# Patient Record
Sex: Female | Born: 1950
Health system: Southern US, Community
[De-identification: ages and names within clinical notes are randomized; demographics above are authoritative.]

## PROBLEM LIST (undated history)

## (undated) DIAGNOSIS — T7840XA Allergy, unspecified, initial encounter: Secondary | ICD-10-CM

## (undated) DIAGNOSIS — K219 Gastro-esophageal reflux disease without esophagitis: Secondary | ICD-10-CM

## (undated) DIAGNOSIS — D649 Anemia, unspecified: Secondary | ICD-10-CM

## (undated) DIAGNOSIS — S149XXA Injury of unspecified nerves of neck, initial encounter: Secondary | ICD-10-CM

## (undated) DIAGNOSIS — Z780 Asymptomatic menopausal state: Secondary | ICD-10-CM

## (undated) DIAGNOSIS — B2790 Infectious mononucleosis, unspecified without complication: Secondary | ICD-10-CM

## (undated) DIAGNOSIS — G4730 Sleep apnea, unspecified: Secondary | ICD-10-CM

## (undated) DIAGNOSIS — K635 Polyp of colon: Secondary | ICD-10-CM

## (undated) DIAGNOSIS — G589 Mononeuropathy, unspecified: Secondary | ICD-10-CM

## (undated) DIAGNOSIS — Z9289 Personal history of other medical treatment: Secondary | ICD-10-CM

## (undated) DIAGNOSIS — C801 Malignant (primary) neoplasm, unspecified: Secondary | ICD-10-CM

## (undated) DIAGNOSIS — M810 Age-related osteoporosis without current pathological fracture: Secondary | ICD-10-CM

## (undated) DIAGNOSIS — R413 Other amnesia: Secondary | ICD-10-CM

## (undated) DIAGNOSIS — R011 Cardiac murmur, unspecified: Secondary | ICD-10-CM

## (undated) DIAGNOSIS — F329 Major depressive disorder, single episode, unspecified: Secondary | ICD-10-CM

## (undated) DIAGNOSIS — F32A Depression, unspecified: Secondary | ICD-10-CM

## (undated) DIAGNOSIS — F419 Anxiety disorder, unspecified: Secondary | ICD-10-CM

## (undated) DIAGNOSIS — M1990 Unspecified osteoarthritis, unspecified site: Secondary | ICD-10-CM

## (undated) DIAGNOSIS — H269 Unspecified cataract: Secondary | ICD-10-CM

## (undated) DIAGNOSIS — IMO0002 Reserved for concepts with insufficient information to code with codable children: Secondary | ICD-10-CM

## (undated) DIAGNOSIS — M8580 Other specified disorders of bone density and structure, unspecified site: Secondary | ICD-10-CM

## (undated) HISTORY — DX: Age-related osteoporosis without current pathological fracture: M81.0

## (undated) HISTORY — DX: Unspecified osteoarthritis, unspecified site: M19.90

## (undated) HISTORY — DX: Major depressive disorder, single episode, unspecified: F32.9

## (undated) HISTORY — PX: CARPAL TUNNEL RELEASE: SHX101

## (undated) HISTORY — DX: Reserved for concepts with insufficient information to code with codable children: IMO0002

## (undated) HISTORY — PX: UMBILICAL HERNIA REPAIR: SHX196

## (undated) HISTORY — DX: Polyp of colon: K63.5

## (undated) HISTORY — PX: CERVICAL FUSION: SHX112

## (undated) HISTORY — PX: BACK SURGERY: SHX140

## (undated) HISTORY — DX: Unspecified cataract: H26.9

## (undated) HISTORY — PX: ROTATOR CUFF REPAIR: SHX139

## (undated) HISTORY — DX: Malignant (primary) neoplasm, unspecified: C80.1

## (undated) HISTORY — PX: ABDOMINAL HYSTERECTOMY: SHX81

## (undated) HISTORY — DX: Depression, unspecified: F32.A

## (undated) HISTORY — DX: Other specified disorders of bone density and structure, unspecified site: M85.80

## (undated) HISTORY — PX: JOINT REPLACEMENT: SHX530

## (undated) HISTORY — PX: SPINE SURGERY: SHX786

## (undated) HISTORY — PX: FRACTURE SURGERY: SHX138

## (undated) HISTORY — PX: KNEE SURGERY: SHX244

## (undated) HISTORY — PX: LUMBAR DISC SURGERY: SHX700

## (undated) HISTORY — PX: HERNIA REPAIR: SHX51

## (undated) HISTORY — DX: Other amnesia: R41.3

## (undated) HISTORY — DX: Mononeuropathy, unspecified: G58.9

## (undated) HISTORY — DX: Allergy, unspecified, initial encounter: T78.40XA

## (undated) HISTORY — DX: Gastro-esophageal reflux disease without esophagitis: K21.9

## (undated) HISTORY — DX: Injury of unspecified nerves of neck, initial encounter: S14.9XXA

## (undated) HISTORY — DX: Anxiety disorder, unspecified: F41.9

## (undated) HISTORY — PX: EYE SURGERY: SHX253

## (undated) HISTORY — DX: Asymptomatic menopausal state: Z78.0

## (undated) HISTORY — DX: Anemia, unspecified: D64.9

## (undated) HISTORY — DX: Sleep apnea, unspecified: G47.30

---
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
## 1969-09-30 DIAGNOSIS — B2790 Infectious mononucleosis, unspecified without complication: Secondary | ICD-10-CM

## 1969-09-30 HISTORY — DX: Infectious mononucleosis, unspecified without complication: B27.90

## 1998-04-17 ENCOUNTER — Other Ambulatory Visit: Admission: RE | Admit: 1998-04-17 | Discharge: 1998-04-17 | Payer: Self-pay | Admitting: Gynecology

## 1998-05-18 ENCOUNTER — Ambulatory Visit (HOSPITAL_COMMUNITY): Admission: RE | Admit: 1998-05-18 | Discharge: 1998-05-18 | Payer: Self-pay | Admitting: Gynecology

## 1998-06-09 ENCOUNTER — Inpatient Hospital Stay (HOSPITAL_COMMUNITY): Admission: RE | Admit: 1998-06-09 | Discharge: 1998-06-11 | Payer: Self-pay | Admitting: Neurosurgery

## 1999-06-19 ENCOUNTER — Other Ambulatory Visit: Admission: RE | Admit: 1999-06-19 | Discharge: 1999-06-19 | Payer: Self-pay | Admitting: Gynecology

## 2000-10-07 ENCOUNTER — Other Ambulatory Visit: Admission: RE | Admit: 2000-10-07 | Discharge: 2000-10-07 | Payer: Self-pay | Admitting: Gynecology

## 2001-09-18 ENCOUNTER — Ambulatory Visit (HOSPITAL_BASED_OUTPATIENT_CLINIC_OR_DEPARTMENT_OTHER): Admission: RE | Admit: 2001-09-18 | Discharge: 2001-09-18 | Payer: Self-pay | Admitting: Orthopedic Surgery

## 2001-12-01 ENCOUNTER — Other Ambulatory Visit: Admission: RE | Admit: 2001-12-01 | Discharge: 2001-12-01 | Payer: Self-pay | Admitting: Gynecology

## 2002-12-28 ENCOUNTER — Other Ambulatory Visit: Admission: RE | Admit: 2002-12-28 | Discharge: 2002-12-28 | Payer: Self-pay | Admitting: Gynecology

## 2004-01-25 ENCOUNTER — Encounter: Admission: RE | Admit: 2004-01-25 | Discharge: 2004-01-25 | Payer: Self-pay | Admitting: Internal Medicine

## 2004-02-08 ENCOUNTER — Encounter: Admission: RE | Admit: 2004-02-08 | Discharge: 2004-02-08 | Payer: Self-pay | Admitting: Internal Medicine

## 2004-02-18 ENCOUNTER — Other Ambulatory Visit: Admission: RE | Admit: 2004-02-18 | Discharge: 2004-02-18 | Payer: Self-pay | Admitting: Gynecology

## 2005-01-13 IMAGING — NM NM HEPATO W/GB/PHARM/[PERSON_NAME]
7 series · 7 of 7 positions shown · non-contrast
Comparison: none

CLINICAL DATA: Right upper quadrant and epigastric abdominal pain and nausea. 
 NM HEPATOBILIARY SCAN W/EJECTION FRACTION: 
 Following the intravenous administration of 5.4 mCi of Qc11m Choletec, normal visualization of the liver, bile ducts, gallbladder, and small bowel was demonstrated. 
 Following the oral ingestion of 8 oz of half and half cream, a gallbladder ejection fraction of 63% was obtained.  The normal gallbladder ejection fraction with half and half cream is greater than 50%.

[gb hepatobiliary · 1 of 1 slices shown (1 of 7)]
[im 1/1]
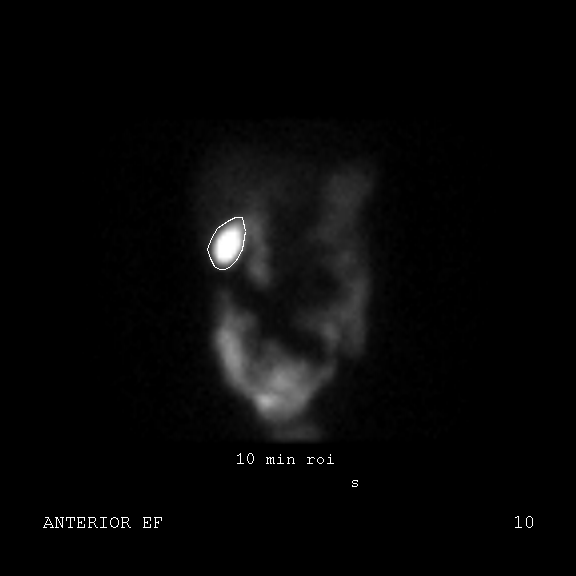

[gb hepatobiliary · 1 of 1 slices shown (2 of 7)]
[im 1/1]
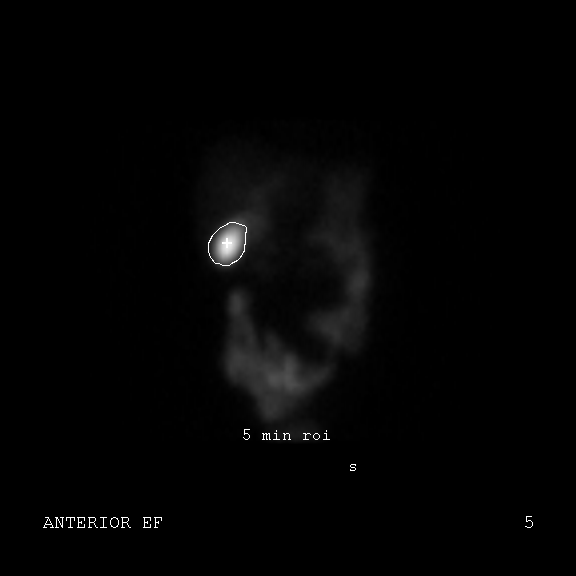

[gb hepatobiliary · 1 of 1 slices shown (3 of 7)]
[im 1/1]
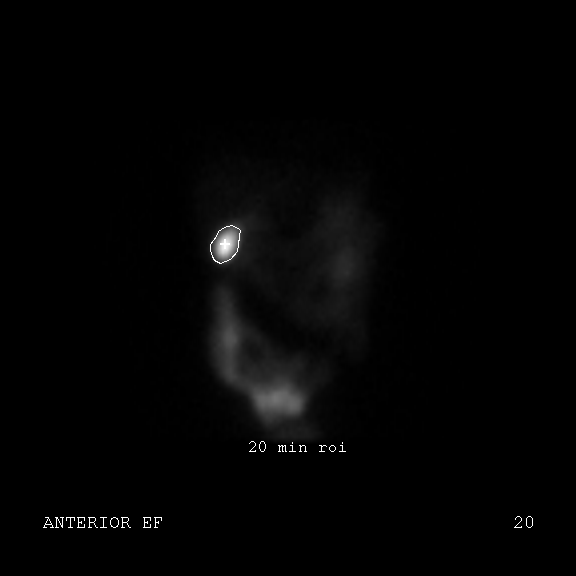

[gb hepatobiliary · 1 of 1 slices shown (4 of 7)]
[im 1/1]
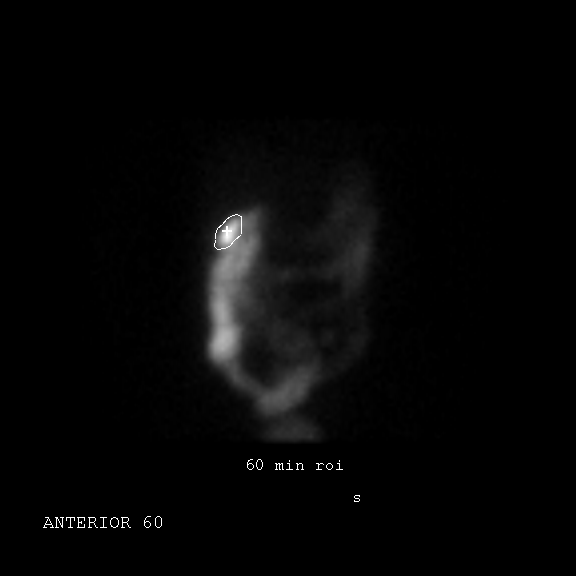

[gb hepatobiliary · 1 of 1 slices shown (5 of 7)]
[im 1/1]
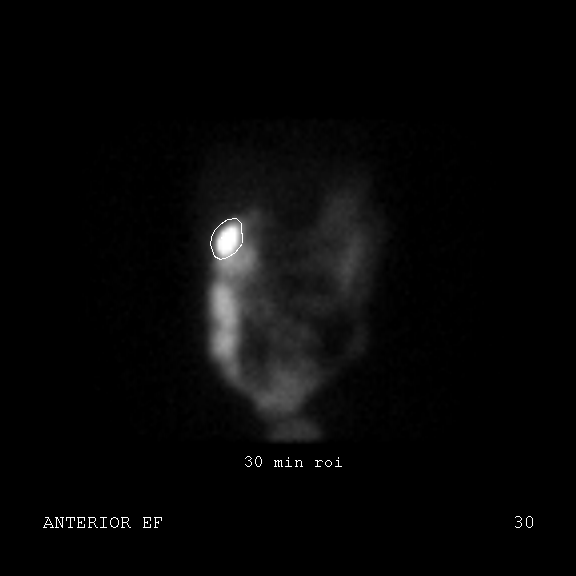

[gb hepatobiliary · 1 of 1 slices shown (6 of 7)]
[im 1/1]
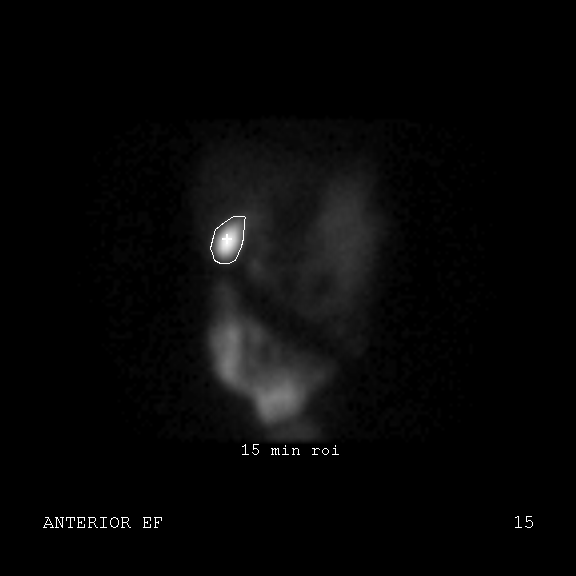

[gb hepatobiliary · 1 of 1 slices shown (7 of 7)]
[im 1/1]
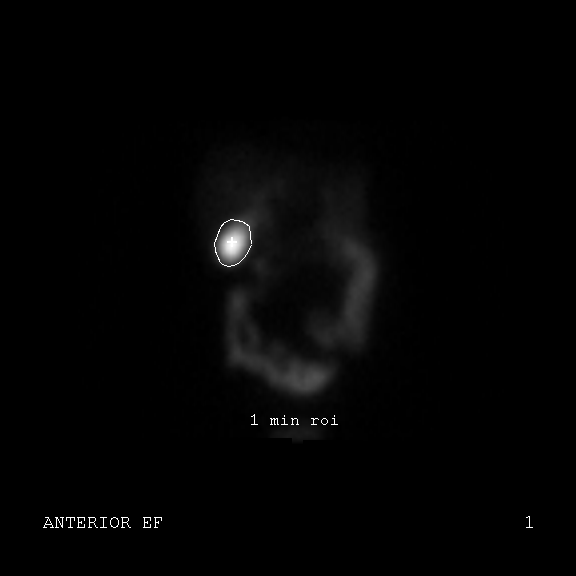

[7 of 7 positions shown; findings below may reference images not displayed]

IMPRESSION: Normal examination.

## 2005-03-23 ENCOUNTER — Other Ambulatory Visit: Admission: RE | Admit: 2005-03-23 | Discharge: 2005-03-23 | Payer: Self-pay | Admitting: Gynecology

## 2005-06-28 ENCOUNTER — Ambulatory Visit: Payer: Self-pay | Admitting: Internal Medicine

## 2005-08-05 ENCOUNTER — Ambulatory Visit: Payer: Self-pay | Admitting: Gastroenterology

## 2005-08-19 ENCOUNTER — Ambulatory Visit: Payer: Self-pay | Admitting: Gastroenterology

## 2005-08-19 ENCOUNTER — Encounter (INDEPENDENT_AMBULATORY_CARE_PROVIDER_SITE_OTHER): Payer: Self-pay | Admitting: *Deleted

## 2005-09-27 ENCOUNTER — Ambulatory Visit (HOSPITAL_COMMUNITY): Admission: RE | Admit: 2005-09-27 | Discharge: 2005-09-27 | Payer: Self-pay | Admitting: Sports Medicine

## 2005-11-22 ENCOUNTER — Inpatient Hospital Stay (HOSPITAL_COMMUNITY): Admission: RE | Admit: 2005-11-22 | Discharge: 2005-11-25 | Payer: Self-pay | Admitting: Neurosurgery

## 2006-01-29 ENCOUNTER — Encounter: Payer: Self-pay | Admitting: Neurosurgery

## 2006-11-06 ENCOUNTER — Ambulatory Visit: Payer: Self-pay | Admitting: Internal Medicine

## 2007-02-10 ENCOUNTER — Encounter: Payer: Self-pay | Admitting: Internal Medicine

## 2007-04-06 ENCOUNTER — Ambulatory Visit: Payer: Self-pay | Admitting: Internal Medicine

## 2007-06-09 ENCOUNTER — Ambulatory Visit: Payer: Self-pay | Admitting: Internal Medicine

## 2008-09-30 HISTORY — PX: LUMBAR DISC SURGERY: SHX700

## 2008-10-27 ENCOUNTER — Ambulatory Visit: Payer: Self-pay | Admitting: Internal Medicine

## 2008-10-27 DIAGNOSIS — IMO0002 Reserved for concepts with insufficient information to code with codable children: Secondary | ICD-10-CM

## 2008-10-27 DIAGNOSIS — M5412 Radiculopathy, cervical region: Secondary | ICD-10-CM | POA: Insufficient documentation

## 2008-11-03 ENCOUNTER — Telehealth: Payer: Self-pay | Admitting: Internal Medicine

## 2008-11-07 ENCOUNTER — Telehealth (INDEPENDENT_AMBULATORY_CARE_PROVIDER_SITE_OTHER): Payer: Self-pay | Admitting: *Deleted

## 2008-11-11 ENCOUNTER — Encounter: Admission: RE | Admit: 2008-11-11 | Discharge: 2008-11-11 | Payer: Self-pay | Admitting: Internal Medicine

## 2008-11-14 ENCOUNTER — Encounter (INDEPENDENT_AMBULATORY_CARE_PROVIDER_SITE_OTHER): Payer: Self-pay | Admitting: *Deleted

## 2008-11-16 ENCOUNTER — Encounter: Payer: Self-pay | Admitting: Internal Medicine

## 2008-12-22 ENCOUNTER — Ambulatory Visit (HOSPITAL_COMMUNITY): Admission: RE | Admit: 2008-12-22 | Discharge: 2008-12-23 | Payer: Self-pay | Admitting: Neurosurgery

## 2009-01-16 ENCOUNTER — Encounter: Payer: Self-pay | Admitting: Internal Medicine

## 2009-05-25 ENCOUNTER — Emergency Department (HOSPITAL_COMMUNITY): Admission: EM | Admit: 2009-05-25 | Discharge: 2009-05-25 | Payer: Self-pay | Admitting: Family Medicine

## 2009-06-09 ENCOUNTER — Ambulatory Visit: Payer: Self-pay | Admitting: Family Medicine

## 2009-06-09 DIAGNOSIS — R109 Unspecified abdominal pain: Secondary | ICD-10-CM

## 2009-06-13 ENCOUNTER — Encounter (INDEPENDENT_AMBULATORY_CARE_PROVIDER_SITE_OTHER): Payer: Self-pay | Admitting: *Deleted

## 2009-06-13 LAB — CONVERTED CEMR LAB
Albumin: 4.2 g/dL (ref 3.5–5.2)
Bilirubin, Direct: 0.1 mg/dL (ref 0.0–0.3)
Total Bilirubin: 0.5 mg/dL (ref 0.3–1.2)

## 2009-08-02 ENCOUNTER — Ambulatory Visit: Payer: Self-pay | Admitting: Internal Medicine

## 2009-08-02 DIAGNOSIS — F325 Major depressive disorder, single episode, in full remission: Secondary | ICD-10-CM | POA: Insufficient documentation

## 2009-08-02 DIAGNOSIS — G479 Sleep disorder, unspecified: Secondary | ICD-10-CM | POA: Insufficient documentation

## 2009-08-02 DIAGNOSIS — R5383 Other fatigue: Secondary | ICD-10-CM

## 2009-08-02 DIAGNOSIS — Z860100 Personal history of colon polyps, unspecified: Secondary | ICD-10-CM | POA: Insufficient documentation

## 2009-08-02 DIAGNOSIS — M8580 Other specified disorders of bone density and structure, unspecified site: Secondary | ICD-10-CM

## 2009-08-02 DIAGNOSIS — G5600 Carpal tunnel syndrome, unspecified upper limb: Secondary | ICD-10-CM

## 2009-08-04 ENCOUNTER — Encounter (INDEPENDENT_AMBULATORY_CARE_PROVIDER_SITE_OTHER): Payer: Self-pay | Admitting: *Deleted

## 2009-08-04 LAB — CONVERTED CEMR LAB
Basophils Absolute: 0 10*3/uL (ref 0.0–0.1)
Eosinophils Absolute: 0.2 10*3/uL (ref 0.0–0.7)
Free T4: 0.7 ng/dL (ref 0.6–1.6)
HCT: 35.8 % — ABNORMAL LOW (ref 36.0–46.0)
Hemoglobin: 12.1 g/dL (ref 12.0–15.0)
Lymphs Abs: 1.8 10*3/uL (ref 0.7–4.0)
MCHC: 33.9 g/dL (ref 30.0–36.0)
MCV: 100.5 fL — ABNORMAL HIGH (ref 78.0–100.0)
Monocytes Relative: 9.4 % (ref 3.0–12.0)
Neutro Abs: 2.6 10*3/uL (ref 1.4–7.7)
Platelets: 214 10*3/uL (ref 150.0–400.0)
TSH: 1.67 microintl units/mL (ref 0.35–5.50)

## 2009-09-21 ENCOUNTER — Ambulatory Visit: Payer: Self-pay | Admitting: Internal Medicine

## 2009-09-21 DIAGNOSIS — F411 Generalized anxiety disorder: Secondary | ICD-10-CM | POA: Insufficient documentation

## 2009-10-06 ENCOUNTER — Telehealth (INDEPENDENT_AMBULATORY_CARE_PROVIDER_SITE_OTHER): Payer: Self-pay | Admitting: *Deleted

## 2009-10-30 ENCOUNTER — Encounter: Payer: Self-pay | Admitting: Internal Medicine

## 2010-02-12 ENCOUNTER — Ambulatory Visit: Payer: Self-pay | Admitting: Internal Medicine

## 2010-09-10 ENCOUNTER — Encounter (INDEPENDENT_AMBULATORY_CARE_PROVIDER_SITE_OTHER): Payer: Self-pay | Admitting: *Deleted

## 2010-10-30 NOTE — Miscellaneous (Signed)
Summary: Flu/[facility]  Flu/[facility]   Imported By: Lanelle Bal 11/03/2009 14:31:10  _____________________________________________________________________  External Attachment:    Type:   Image     Comment:   External Document

## 2010-10-30 NOTE — Assessment & Plan Note (Signed)
Summary: chest congestion//temp 101.6/lch   Vital Signs:  Patient profile:   60 year old female Weight:      124.6 pounds Temp:     101.3 degrees F BP sitting:   104 / 60  Vitals Entered By: Shary Decamp (Feb 12, 2010 2:17 PM) CC: acute only, sick x several day, clear nasal/chest congestion until yesterday-- now thick, yellow; fever (up to 102.1) x 3 days.  Using mucinex   History of Present Illness: symptoms started 5 days ago with a scratchy, tight throat Later on she developed cough initially with clear sputum , then some  chest congestion. Now she is coughing more, sputum is yellow to green in color She had fever up to 102 2 days ago She is taking Robitussin over-the-counter.   Current Medications (verified): 1)  Evista 60 Mg  Tabs (Raloxifene Hcl) .Marland Kitchen.. 1 By Mouth Qd 2)  Cymbalta 60 Mg Cpep (Duloxetine Hcl) .Marland Kitchen.. 1 Once Daily 3)  Abilify 5 Mg Tabs (Aripiprazole) .Marland Kitchen.. 1 Once Daily  Allergies (verified): 1)  ! Neurontin  Past History:  Past Medical History: Reviewed history from 08/02/2009 and no changes required. DDD Depression ? Osteopenia CTS surgery RUE Colonic polyps, hx of 2005  Past Surgical History: Reviewed history from 08/02/2009 and no changes required. umbilical hernia; G2 P2 Cervical fusion X2; Lumbar surgery(rods ,screws), Dr Venetia Maxon; Rotator cuff repair R  Social History: Reviewed history from 08/02/2009 and no changes required. Occupation:Teacher part time Married Never Smoked Alcohol use-yes:rare Regular exercise-no  Review of Systems       some sinus congestion No sick contacts that she can tell No nausea vomiting or diarrhea No history of asthma or tobacco use  Physical Exam  General:  alert and well-developed.  frequent cough noted, no respiratory distress Head:  face symmetric, not tender to palpation at the maxillary or frontal sinuses Ears:  R ear normal and L ear normal.   Nose:  slightly congested Mouth:  no redness or  discharge Lungs:  normal respiratory effort and no intercostal retractions.  few rhonchi with cough, no wheezing   Impression & Recommendations:  Problem # 1:  BRONCHITIS- ACUTE (ICD-466.0)  symptoms consistent with bronchitis sse instructions  Her updated medication list for this problem includes:    Zithromax Z-pak 250 Mg Tabs (Azithromycin) .Marland Kitchen... As directed    Hydromet 5-1.5 Mg/76ml Syrp (Hydrocodone-homatropine) .Marland KitchenMarland KitchenMarland KitchenMarland Kitchen 5 cc by mouth every 8 hours as needed for severe cough  Complete Medication List: 1)  Evista 60 Mg Tabs (Raloxifene hcl) .Marland Kitchen.. 1 by mouth qd 2)  Cymbalta 60 Mg Cpep (Duloxetine hcl) .Marland Kitchen.. 1 once daily 3)  Abilify 5 Mg Tabs (Aripiprazole) .Marland Kitchen.. 1 once daily 4)  Zithromax Z-pak 250 Mg Tabs (Azithromycin) .... As directed 5)  Hydromet 5-1.5 Mg/51ml Syrp (Hydrocodone-homatropine) .... 5 cc by mouth every 8 hours as needed for severe cough  Patient Instructions: 1)  rest, fluids, Tylenol 2)  Robitussin-DM as needed for cough 3)  If the cough is severe, you can use hydrocodone. Watch for excessive somnolence 4)  Take antibiotics as prescribed 5)  Call if not better in a few days, call anytime if you feel a lot worse 6)    Prescriptions: HYDROMET 5-1.5 MG/5ML SYRP (HYDROCODONE-HOMATROPINE) 5 cc by mouth every 8 hours as needed for severe cough  #150cc x 0   Entered and Authorized by:   Nolon Rod. Lyncoln Ledgerwood MD   Signed by:   Nolon Rod. Neithan Day MD on 02/12/2010   Method used:  Print then Give to Patient   RxID:   [id number] ZITHROMAX Z-PAK 250 MG TABS (AZITHROMYCIN) as directed  #1 x 0   Entered and Authorized by:   Nolon Rod. Cayle Cordoba MD   Signed by:   Nolon Rod. Duwayne Matters MD on 02/12/2010   Method used:   Electronically to        [facility] [address] [phone]* (retail)       [address]       [place], [place]  [address]       Ph: [phone] or [phone]       Fax: [phone]   RxID:   [id number]

## 2010-10-30 NOTE — Progress Notes (Signed)
Summary: rx med  Phone Note Call from Patient   Summary of Call: pt left VM that she was given sample of ABILIFY and it is helping some and she wants to know what she needs to do if she need a rx for it.called pt back informed pt that rx was given at [facility] pt states that she forgot about them but she has them and will go fill them...............Marland KitchenFelecia Deloach CMA  October 06, 2009 2:05 PM

## 2010-11-01 ENCOUNTER — Ambulatory Visit: Admit: 2010-11-01 | Payer: Self-pay | Admitting: Internal Medicine

## 2010-11-01 ENCOUNTER — Encounter: Payer: Self-pay | Admitting: Internal Medicine

## 2010-11-01 ENCOUNTER — Other Ambulatory Visit: Payer: Self-pay | Admitting: Internal Medicine

## 2010-11-01 ENCOUNTER — Ambulatory Visit (INDEPENDENT_AMBULATORY_CARE_PROVIDER_SITE_OTHER): Payer: BC Managed Care – PPO | Admitting: Internal Medicine

## 2010-11-01 DIAGNOSIS — Z86010 Personal history of colonic polyps: Secondary | ICD-10-CM

## 2010-11-01 DIAGNOSIS — Z0000 Encounter for general adult medical examination without abnormal findings: Secondary | ICD-10-CM

## 2010-11-01 DIAGNOSIS — F329 Major depressive disorder, single episode, unspecified: Secondary | ICD-10-CM

## 2010-11-01 DIAGNOSIS — M899 Disorder of bone, unspecified: Secondary | ICD-10-CM

## 2010-11-01 LAB — CBC WITH DIFFERENTIAL/PLATELET
Basophils Relative: 0.6 % (ref 0.0–3.0)
Eosinophils Absolute: 0.1 10*3/uL (ref 0.0–0.7)
Eosinophils Relative: 2.2 % (ref 0.0–5.0)
Hemoglobin: 12.4 g/dL (ref 12.0–15.0)
Lymphocytes Relative: 41 % (ref 12.0–46.0)
Monocytes Relative: 8.1 % (ref 3.0–12.0)
Neutro Abs: 1.9 10*3/uL (ref 1.4–7.7)
Neutrophils Relative %: 48.1 % (ref 43.0–77.0)
Platelets: 209 10*3/uL (ref 150.0–400.0)

## 2010-11-01 LAB — BASIC METABOLIC PANEL
BUN: 18 mg/dL (ref 6–23)
CO2: 30 mEq/L (ref 19–32)
Calcium: 9.2 mg/dL (ref 8.4–10.5)
Chloride: 104 mEq/L (ref 96–112)
Creatinine, Ser: 0.8 mg/dL (ref 0.4–1.2)
Potassium: 4.1 mEq/L (ref 3.5–5.1)

## 2010-11-01 LAB — LIPID PANEL
HDL: 65.4 mg/dL (ref >39.00)
Triglycerides: 36 mg/dL (ref 0.0–149.0)
VLDL: 7.2 mg/dL (ref 0.0–40.0)

## 2010-11-01 LAB — HEPATIC FUNCTION PANEL
ALT: 12 U/L (ref 0–35)
AST: 20 U/L (ref 0–37)
Albumin: 3.7 g/dL (ref 3.5–5.2)
Alkaline Phosphatase: 54 U/L (ref 39–117)
Bilirubin, Direct: 0.1 mg/dL (ref 0.0–0.3)
Total Protein: 6.6 g/dL (ref 6.0–8.3)

## 2010-11-01 LAB — TSH: TSH: 1.57 u[IU]/mL (ref 0.35–5.50)

## 2010-11-01 NOTE — Letter (Signed)
Summary: Primary Care Appointment Letter  [facility]  [address] [place], [place] [address]   Phone: [phone]  Fax: [phone]    09/10/2010 MRN: [id number]  [facility] [address] [place], [place]  [address]  Dear Ms. Arterberry,   Your Primary Care Physician Marga Melnick MD has indicated that:    ____X___it is time to schedule an appointment(In order to continue refilling meds please call to schedule a yearly check-up).    _______you missed your appointment on______ and need to call and          reschedule.    _______you need to have lab work done.    _______you need to schedule an appointment discuss lab or test results.    _______you need to call to reschedule your appointment that is                       scheduled on _________.     Please call our office as soon as possible. Our phone number is [phone]. Please press option 1. Our office is open 8a-5p, Monday through Friday.     Thank you,    [facility]

## 2010-11-05 LAB — CONVERTED CEMR LAB: Vit D, 25-Hydroxy: 28 ng/mL — ABNORMAL LOW (ref 30–89)

## 2010-11-07 NOTE — Assessment & Plan Note (Signed)
Summary: med refill/sph     confirmed with patient     2/1   ///sph   Vital Signs:  Patient profile:   60 year old female Height:      59.5 inches Weight:      124 pounds BMI:     24.71 Temp:     98.2 degrees F oral Pulse rate:   76 / minute Resp:     14 per minute BP sitting:   112 / 70  (left arm) Cuff size:   regular  Vitals Entered By: Shonna Chock CMA (November 01, 2010 10:52 AM) CC: CPX, not fasting today (patient had few sip's of Diet Dr.Pepper), Depressive symptoms   CC:  CPX, not fasting today (patient had few sip's of Diet Dr.Pepper), and Depressive symptoms.  History of Present Illness:     Jessica Casey  is here for a physical; she feels depression is poorly controlled despite Cymbalta 60 mg once daily . Abilify " made me  a zombie... no pleasure. I couln't cry when my mother died January 25, 2010." The patient reports depressed mood, loss of interest/pleasure, and hypersomnia, but denies significant weight loss, significant weight gain, and insomnia 9if Cymbalta taken).  The patient also reports fatigue or loss of energy, feelings of worthlessness, and indecisiveness.  The patient denies diminished concentration and suicidal intent.  The patient reports the following psychosocial stressors: recent death of a loved one & stress of uncertainly of Gary's job.    Current Medications (verified): 1)  Evista 60 Mg  Tabs (Raloxifene Hcl) .Marland Kitchen.. 1 By Mouth Qd 2)  Cymbalta 60 Mg Cpep (Duloxetine Hcl) .Marland Kitchen.. 1 Once Daily**appointment Due** 3)  Mega Red .Marland Kitchen.. 1 By Mouth Once Daily 4)  Calcium 600 1500 Mg Tabs (Calcium Carbonate) .Marland Kitchen.. 1 By Mouth Once Daily 5)  Biotin 300 Mcg Tabs (Biotin) .Marland Kitchen.. 1 By Mouth Once Daily 6)  Aspirin 81 Mg Tabs (Aspirin) .Marland Kitchen.. 1 By Mouth Once Daily  Allergies: 1)  ! Neurontin  Past History:  Past Medical History: DDD Depression ? 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    Physical Exam  General:  well-nourished;alert,appropriate and cooperative throughout examination Head:  Normocephalic and atraumatic without obvious abnormalities.  Eyes:  No corneal or conjunctival inflammation noted.Perrla. Funduscopic exam benign, without hemorrhages, exudates or papilledema.  Ears:  External ear exam shows no significant lesions or deformities.  Otoscopic examination reveals clear canals, tympanic membranes are intact bilaterally without bulging, retraction, inflammation or discharge. Hearing is grossly normal bilaterally. Nose:  External nasal examination shows no deformity or inflammation. Nasal mucosa are pink and moist without lesions or exudates. Mouth:  Oral mucosa and oropharynx without lesions or exudates.  Teeth in good repair. Neck:  No deformities, masses, or tenderness noted. Lungs:  Normal respiratory effort,  chest expands symmetrically. Lungs are clear to auscultation, no crackles or wheezes. Heart:  normal rate, regular rhythm, no gallop, no rub, no JVD, no HJR, and grade 1/2  /6 systolic murmur.   Abdomen:  Bowel sounds positive,abdomen soft and non-tender without  masses, organomegaly or hernias noted. Aorta palpable w/o AAA Genitalia:  Dr. Greta Doom Msk:  No deformity or scoliosis noted of thoracic or lumbar spine.   LS op scar wel healed Pulses:  R and L carotid,radial,dorsalis pedis and posterior tibial pulses are full and equal bilaterally Extremities:  No clubbing, cyanosis, edema, or deformity noted with normal full range of motion of all joints.   Neurologic:  alert & oriented X3, strength normal in all extremities, and DTRs symmetrical and normal.   Skin:  Intact without suspicious lesions or rashes Cervical Nodes:  No lymphadenopathy noted Axillary Nodes:  No palpable lymphadenopathy Psych:  memory intact for recent and remote and subdued.     Impression & Recommendations:  Problem # 1:  ROUTINE GENERAL MEDICAL EXAM@HEALTH  CARE FACL (ICD-V70.0)  Orders: EKG w/ Interpretation ([address]) Venipuncture ([address]) TLB-Lipid Panel (80061-LIPID) TLB-BMP (Basic Metabolic Panel-BMET) (80048-METABOL) TLB-CBC Platelet - w/Differential (85025-CBCD) TLB-Hepatic/Liver Function Pnl (80076-HEPATIC) TLB-TSH (Thyroid Stimulating Hormone) (84443-TSH) T-Vitamin D (25-Hydroxy) (60454-09811) Gastroenterology Referral (GI)  Problem # 2:  DEPRESSION (ICD-311)  Her updated medication list for this problem includes:    Cymbalta 60 Mg Cpep (Duloxetine hcl) .Marland Kitchen... 1 once daily  Problem # 3:  OSTEOPENIA (ICD-733.90)  Her updated medication list for this problem includes:    Evista 60 Mg Tabs (Raloxifene hcl) .Marland Kitchen... 1 by mouth qd  Problem # 4:  COLONIC POLYPS, HX OF (ICD-V12.72)  Orders: Gastroenterology Referral (GI)  Complete Medication List: 1)  Evista 60 Mg Tabs (Raloxifene hcl) .Marland Kitchen.. 1 by mouth qd 2)  Cymbalta 60 Mg Cpep (Duloxetine hcl) .Marland Kitchen.. 1 once daily 3)  Mega Red  .Marland Kitchen.. 1 by mouth once daily 4)  Calcium 600 1500 Mg Tabs (Calcium carbonate) .Marland Kitchen.. 1 by mouth once daily 5)  Biotin 300 Mcg Tabs (Biotin) .Marland Kitchen.. 1 by mouth once daily 6)  Aspirin 81 Mg Tabs  (Aspirin) .Marland Kitchen.. 1 by mouth once daily  Patient Instructions: 1)  Check with Dr Greta Doom; BMD  is due every 25 months. Prescriptions: CYMBALTA 60 MG CPEP (DULOXETINE HCL) 1 once daily  #30 x 11   Entered and Authorized by:   Marga Melnick MD   Signed by:   Marga Melnick MD on 11/01/2010   Method used:   Print then Give to Patient   RxID:   [id number]    Orders Added: 1)  Est. Patient 40-64 years [99396] 2)  EKG w/ Interpretation [93000] 3)  Venipuncture [36415] 4)  TLB-Lipid Panel [80061-LIPID] 5)  TLB-BMP (Basic Metabolic Panel-BMET) [80048-METABOL] 6)  TLB-CBC Platelet - w/Differential [85025-CBCD] 7)  TLB-Hepatic/Liver Function Pnl [80076-HEPATIC] 8)  TLB-TSH (Thyroid Stimulating Hormone) [84443-TSH] 9)  T-Vitamin D (25-Hydroxy) [78469-62952] 10)  Gastroenterology Referral [GI]     Appended Document: med refill/sph     confirmed with patient     2/1   ///sph

## 2011-01-10 LAB — CBC
RBC: 3.8 MIL/uL — ABNORMAL LOW (ref 3.87–5.11)
RDW: 13.6 % (ref 11.5–15.5)
WBC: 4.6 10*3/uL (ref 4.0–10.5)

## 2011-02-12 NOTE — Op Note (Signed)
[name], Casey NO.:  [id number]   MEDICAL RECORD NO.:  [id number]          PATIENT TYPE:  OIB   LOCATION:  3599                         FACILITY:  [facility]   PHYSICIAN:  Danae Orleans. Venetia Maxon, M.D.  DATE OF BIRTH:  02-01-1951   DATE OF PROCEDURE:  12/22/2008  DATE OF DISCHARGE:                               OPERATIVE REPORT   PREOPERATIVE DIAGNOSES:  Neck pain with herniated cervical disk,  spondylosis, and cervical radiculopathy.   POSTOPERATIVE DIAGNOSES:  Neck pain with herniated cervical disk,  spondylosis, and cervical radiculopathy.   PROCEDURES:  1. Exploration and fusion, C5-C7 levels.  2. Anterior cervical decompression fusion, C4-C5 with bone allograft      and autograft and anterior cervical plate.   SURGEON:  Danae Orleans. Venetia Maxon, MD   ASSISTANT:  Hilda Lias, MD   ANESTHESIA:  General endotracheal anesthesia.   ESTIMATED BLOOD LOSS:  Minimal.   COMPLICATIONS:  None.   DISPOSITION:  Recovery.   INDICATIONS:  Jessica Casey is a 60 year old woman who previously had  undergone anterior cervical decompression and fusion at C5-C7 level and  has developed neck pain and upper extremity radiculopathy with weakness  in her left greater than right deltoid and spondylolisthesis at C4-C5  with herniated cervical disk at this level.  It was elected to take her  to surgery for anterior cervical decompression and fusion at C4-C5 level  and exploration of prior fusion at C5-C7 levels.   PROCEDURE IN DETAIL:  Jessica Casey was brought to the operating room.  Following a satisfactory, uncomplicated, and excellent general  endotracheal anesthesia plus intravenous lines, the patient was placed  in a supine position on the operative table.  The neck was placed in  slight extension.  She was placed in 5 pounds halter traction.  Her  anterior neck was then prepped and draped in the usual sterile fashion.  The area of planned incision was infiltrated with 0.25% Marcaine  and  0.5% lidocaine with 1:200,000 epinephrine.  Incision was made through  previous left-sided transverse skin incision carried through the  platysma layer.  Subplatysmal dissection was performed exposing the  anterior border of the sternocleidomastoid muscle using blunt  dissection, carotid sheath was kept lateral, trachea and esophagus kept  medial exposing the previously placed anterior cervical plate.  Extensive scar tissue was removed from the plate and the plate was  removed.  The screw holes were packed with Gelfoam.  The bony interfaces  were inspected over the previous area of fusion and appeared to be solid  arthrodesis at these levels with no evidence of instability.  Subsequently, the large ventral osteophyte at C4-C5 was removed and  longus colli muscles were taken down from C4-C5 bilaterally.  Self-  retaining Shadow-Line retractor was placed along with up and down  retractor.  The interspace was incised.  Disk material was removed in  piecemeal fashion.  Using gentle distraction, the interspace was opened  with distraction pins and the endplates were decorticated using high-  speed drill.  The uncinate spurs were removed and posterior longitudinal  ligament was incised and  removed in piecemeal fashion decompressing the  cervical spinal cord dura and both the neural foramina.  After trial  sizing, a 6-mm allograft small bone wedge was selected and fashioned  with high-speed drill, packed with morselized bone autograft which had  been retained from drilling the endplates, and after hemostasis was  assured at this level, the bone graft was inserted and countersunk  appropriately.  A 14-mm Trestle anterior cervical plate was affixed to  the anterior cervical spine using variable angle 14-mm screws with a  rescue screw at the upper holes on the C5 level and 14 x 4 mm screws at  C4.  All screws had excellent purchase and locking mechanisms were  engaged.  Traction weight was  removed prior to placing the plate.  The  wound was then irrigated.  Soft tissues were inspected and found to be  in good repair and the final x-ray demonstrated well-positioned  interbody graft and anterior cervical plate.  The wound was irrigated.  Soft tissue were then closed with 3-0 Vicryl at the platysma and 3-0  Vicryl subcuticular stitch.  The wound was dressed with Dermabond.  The  patient was extubated in the operating room and taken to the recovery  room in stable and satisfactory condition having tolerated the operation  well.  Counts were correct at the end of the case.      Danae Orleans. Venetia Maxon, M.D.  Electronically Signed     JDS/MEDQ  D:  12/22/2008  T:  12/22/2008  Job:  [id number]

## 2011-02-15 NOTE — Op Note (Signed)
[place]  Patient:    Jessica Casey, Jessica Casey Visit Number: [id number] MRN: [id number]          Service Type: DSU Location: [facility] Attending Physician:  Ronne Binning Dictated by:   Nicki Reaper, M.D. Proc. Date: 09/18/01 Admit Date:  09/18/2001                             Operative Report  PREOPERATIVE DIAGNOSIS:  Carpal tunnel syndrome, right hand.  POSTOPERATIVE DIAGNOSIS:  Carpal tunnel syndrome, right hand.  OPERATION:  Decompression right median nerve.  SURGEON:  Nicki Reaper, M.D.  ASSISTANT:  Joaquin Courts, R.N.  ANESTHESIA:  Forearm-based IV regional.  DATE OF OPERATION:  September 18, 2001  ANESTHESIOLOGIST:  Janetta Hora. Gelene Mink, M.D.  HISTORY:  The patient is a 60 year old female with a history of carpal tunnel syndrome, EMG nerve conduction is positive, which has not responded to conservative treatment.  PROCEDURE:  The patient was brought to the operating room where a forearm-based IV regional anesthetic was carried out without difficulty.  She was prepped and draped using Betadine scrub and solution with the right arm free.  A longitudinal incision was made in the palm and carried down through the subcutaneous tissue.  Bleeders were electrocauterized.  The palmar fascia was split, the superficial palmar arch identified, the flexor tendon tendon to the ring and little finger identified to the ulnar side of the median nerve. The carpal retinaculum was incised with sharp dissection.  A right angle and Sewall retractor were placed between the skin and forearm fascia.  The fascia was released for approximately 3 cm proximal to the wrist crease under direct vision.  The canal was explored and no further lesions were identified.  The wound was irrigated.  The skin was closed with interrupted 5-0 nylon sutures. A sterile compressive dressing and splint were applied.  The patient tolerated the procedure well and was taken to the  recovery room for observation in satisfactory condition.  DISPOSITION:  She is discharged home to return to [facility] in one week on Vicodin and Keflex. Dictated by:   Nicki Reaper, M.D. Attending Physician:  Ronne Binning DD:  09/18/01 TD:  09/19/01 Job: [id number]

## 2011-02-15 NOTE — Discharge Summary (Signed)
[name], [name] NO.:  [id number]   MEDICAL RECORD NO.:  [id number]          PATIENT TYPE:  INP   LOCATION:  3002                         FACILITY:  [facility]   PHYSICIAN:  Stefani Dama, M.D.  DATE OF BIRTH:  May 08, 1951   DATE OF ADMISSION:  11/22/2005  DATE OF DISCHARGE:  11/25/2005                                 DISCHARGE SUMMARY   ADMISSION DIAGNOSIS:  L3-L4 spondylolisthesis with stenosis, lumbar  radiculopathy and herniated nucleus pulposus.   DISCHARGE DIAGNOSIS:  L3-L4 spondylolisthesis with stenosis, lumbar  radiculopathy and herniated nucleus pulposus.   FINAL DIAGNOSIS:  L3-L4 spondylolisthesis with stenosis, lumbar  radiculopathy and herniated nucleus pulposus.   MAJOR OPERATION:  L3-L4 laminectomy, posterior lumbar interbody arthrodesis  with peak spacers pedicle screw fixation posterolateral arthrodesis with  local autograft and allograft L3-L4 on November 22, 2005.   CONDITION ON DISCHARGE:  Improving.   HOSPITAL COURSE:  Ms. Jessica Casey is a 60 year old individual who has had  significant back and lower extremity pain. She has evidence of significant  spondylolisthesis and herniated nucleus pulposus at the level L3-L4. She  underwent surgical decompression arthrodesis. Postoperatively the patient  was gradually able to be mobilized. She is currently out of bed. She is  ambulating independently. Her incision is clean and dry. She is using oral  Percocet for pain control and Valium as a muscle relaxer. She will be  discharged at this time with a rolling walker for home use. She will be seen  in the office in 3 weeks time for further follow up. During the  postoperative period she experienced no complications, was gradually and  smoothly moved from parenteral pain medication to oral pain medication and  appears to be tolerating this well.   CONDITION ON DISCHARGE:  Improved.      Stefani Dama, M.D.  Electronically Signed     HJE/MEDQ   D:  11/25/2005  T:  11/25/2005  Job:  [id number]

## 2011-02-15 NOTE — Op Note (Signed)
NAMECARIANNE, TAIRA NO.:  [id number]   MEDICAL RECORD NO.:  [id number]          PATIENT TYPE:  INP   LOCATION:  3002                         FACILITY:  [facility]   PHYSICIAN:  Danae Orleans. Venetia Maxon, M.D.  DATE OF BIRTH:  01-Oct-1950   DATE OF PROCEDURE:  11/22/2005  DATE OF DISCHARGE:                                 OPERATIVE REPORT   PREOPERATIVE DIAGNOSIS:  L3-4 spondylolisthesis with scoliosis, herniated  lumbar disk and lumbar radiculopathy.   POSTOPERATIVE DIAGNOSIS:  L3-4 spondylolisthesis with scoliosis, herniated  lumbar disk and lumbar radiculopathy.   PROCEDURE:  L3-4 laminectomy, posterior lumbar interbody fusion procedure  with PEEK interbody cage and morcelized bone autograft, pedicle screw  fixation, L3 through L4, bilaterally, with posterolateral arthrodesis, L3  through L4 levels.   SURGEON:  Danae Orleans. Venetia Maxon, M.D.   ASSISTANT:  Stefani Dama, M.D.   ANESTHESIA:  General endotracheal anesthesia.   ESTIMATED BLOOD LOSS:  250 mL.   COMPLICATIONS:  None.   DISPOSITION:  To Recovery.   INDICATIONS:  Jessica Casey is a 60 year old woman with a bilobed disk  herniation at L3-4 with intersegmental instability and scoliosis at this  level.  It was elected to take her to surgery for decompression and fusion  at this level.   DESCRIPTION OF PROCEDURE:  Ms. Frech was brought to the operating room.  Following the satisfactory and uncomplicated induction of general  endotracheal anesthesia, she was placed in the prone position on the [place]  table.  Her low back was then prepped and draped in the usual sterile  fashion.  The area of planned incision was infiltrated with 0.25% Marcaine  and 0.5% lidocaine with 1:200,000 epinephrine.  Incision was made in the  midline and carried through subcutaneous tissues and the lumbodorsal fascia  was incised bilaterally.  Subperiosteally dissection was performed, exposing  the L3 and L4 interspace and this was  confirmed on intraoperative x-ray.  Subsequent x-ray was obtained with marker probes on the L3 and L4 transverse  processes.  A self-retaining retractor was placed and hemilaminectomy of L3  was performed.  There was significant facet arthropathy and the thecal sac  was decompressed, as were the L3 and L4 nerve roots bilaterally.  There was  evidence of a disk herniation bilaterally at L3-4.  Initially on the left  side, the disk herniation was removed.  After incising the interspace, the  disk space was entered and disk material was removed.  Subsequently on the  right side, a similar decompression was performed.  Initially an 8- and then  subsequently a 9-mm trial interbody spacer was placed, then the endplates  were stripped of residual disk material using a variety of endplates and  preparation curettes.  Morcelized bone autograft was then passed in a 9-mm  PEEK interbody cage, which was inserted in the interspace and countersunk  appropriately, initially on the right and then subsequently on the left side  of the midline.  The PEEK cage position was confirmed on AP and lateral  lumbar fluoroscopy.  Subsequently, pedicle screw fixation was placed with 45  x 5.5-mm screws at L3 and 40 x 5.5-mm screws at L4.  All screws had  excellent purchase and no evidence of any cutouts.  AP and lateral  fluoroscopy demonstrated well-positioned screws within the pedicles.  Subsequently, posterolateral arthrodesis was performed with bone graft  extender and also morcelized bone autograft, which was placed overlying the  decorticated transverse processes of L3 and L4 bilaterally.  Subsequently,  pre-lordosed rods were placed and the construct was locked down in situ.  Prior to placing the wound was then copiously irrigated with Bacitracin and  saline.  Soft tissues were inspected and found to be in good repair.  The  lumbodorsal fascia was closed with 0 Vicryl suture, subcutaneous tissues  were  reapproximated with 2-0 Vicryl interrupted and inverted sutures and the  skin edges were reapproximated with interrupted 3-0 Vicryl subcuticular  stitch.  The wound was dressed with Benzoin and Steri-Strips and Telfa gauze  tape.  The patient was taken to the operating room and taken to the recovery  room in stable and satisfactory condition, having tolerated her operation  well.  Counts were correct at the end of the case.      Danae Orleans. Venetia Maxon, M.D.  Electronically Signed     JDS/MEDQ  D:  11/22/2005  T:  11/23/2005  Job:  [id number]

## 2011-04-10 ENCOUNTER — Encounter: Payer: Self-pay | Admitting: Internal Medicine

## 2011-04-10 ENCOUNTER — Ambulatory Visit (INDEPENDENT_AMBULATORY_CARE_PROVIDER_SITE_OTHER): Payer: BC Managed Care – PPO | Admitting: Internal Medicine

## 2011-04-10 DIAGNOSIS — IMO0002 Reserved for concepts with insufficient information to code with codable children: Secondary | ICD-10-CM

## 2011-04-10 DIAGNOSIS — M5417 Radiculopathy, lumbosacral region: Secondary | ICD-10-CM

## 2011-04-10 DIAGNOSIS — M4800 Spinal stenosis, site unspecified: Secondary | ICD-10-CM

## 2011-04-10 MED ORDER — HYDROCODONE-ACETAMINOPHEN 7.5-500 MG PO TABS: 1.0000 | ORAL_TABLET | ORAL | Status: AC | PRN

## 2011-04-10 NOTE — Patient Instructions (Signed)
Please confirm with Dr. Fredrich Birks office as to whether  MRI of the lumbosacral spine can be performed in context of existing "hardware".

## 2011-04-10 NOTE — Progress Notes (Signed)
Subjective:    Patient ID: Jessica Casey, female    DOB: 12-May-1951, 60 y.o.   MRN: [id number]  HPIBACK PAIN Location: L LS area   Onset: 6 mos ago Progression: worse past 6 weeks   Severity: up to 10 Pain is described as: usually aching; can be sharp  Worse with: supine    Better with: Advil  Pain radiates to: popliteal space   Impaired range of motion: no History of repetitive motion:  no  History of overuse or hyperextension:  no  History of trauma:  no   Past history of similar problem:  no  Symptoms Numbness/tingling:  yes, some in posterior thigh  Weakness:  no Red Flags Fever:  no  Bowel/bladder dysfunction:  No Weight loss: no  Her past medical history is  VERY  significant : since 1997 she's had 2 cervical spine fusions & lumbosacral spine fusion L4-5 in the context of spinal stenosis. All procedures by Dr Henderson [place]       Review of Systems she denies dysuria, pyuria, or hematuria.     Objective:   Physical Exam Gen.: Healthy and well-nourished in appearance. Alert, appropriate and cooperative throughout exam. Neck: No deformities, masses, or tenderness noted. Range of motion  decreased Lungs: Normal respiratory effort; chest expands symmetrically. Lungs are clear to auscultation without rales, wheezes, or increased work of breathing. Heart: Normal rate and rhythm. Normal S1 and S2. No gallop, click, or rub. Grade 1/6 murmur. Abdomen: Bowel sounds normal; abdomen soft and nontender. No masses, organomegaly or hernias noted. No AAA or bruits                                                                                    Musculoskeletal/extremities: No deformity or scoliosis noted of  the thoracic or lumbar spine.Op scar LS area well healed. No clubbing, cyanosis, edema, or deformity noted. Range of motion  normal .Tone & strength  Normal.Joints: minor DIP @ thumbs & 3rd R finger. Nail health  good. Vascular: Carotid, radial artery, dorsalis pedis and  posterior  tibial pulses are full and equal. No bruits present. Neurologic: Alert and oriented x3. Deep tendon reflexes symmetrical and normal. Strength appears decreased and her fingers to opposition. Strength and tendon are normal in the lower extremities. Toe walking appears normal; there is suggestion of a slight footdrop on the right. She is able to lie down and get up without help. Straight leg raising is negative bilaterally. She did have some mild discomfort in left lumbosacral area with elevation of the right leg.  Skin: Intact without suspicious lesions or rashes. Lymph: No cervical, axillary, or inguinal lymphadenopathy present. Psych: Mood and affect are normal. Normally interactive  Assessment & Plan:  #1 progressive left lower lumbosacral area pain  #2 sciatica  #3 spinal stenosis, diffuse. Past medical history cervical fusion x2 in lumbosacral fusion x1.  Plan: Pain medication and muscle muscle relaxant initiated pending results of the CT scan. By history she has "screws" in her spine which would preclude MRI.

## 2011-04-11 ENCOUNTER — Telehealth: Payer: Self-pay | Admitting: Internal Medicine

## 2011-04-11 DIAGNOSIS — IMO0002 Reserved for concepts with insufficient information to code with codable children: Secondary | ICD-10-CM

## 2011-04-11 NOTE — Telephone Encounter (Signed)
In reference to Radiology Referral of the Lumbar Spine, per Dr. Frederik Pear note, "Change to an MRI of the spine if the hardware will allow this, patient believes that she may have titanium screws and lumbosacral spine".  Per phone call from patient, she called Dr. Fredrich Birks office & Dr. Venetia Maxon gave the OK for patient to have MRI LS w/o contrast.  Need CT order removed, and new order entered please.

## 2011-04-12 NOTE — Progress Notes (Signed)
Addended by: Doristine Devoid on: 04/12/2011 10:45 AM   Modules accepted: Orders

## 2011-04-15 ENCOUNTER — Ambulatory Visit
Admission: RE | Admit: 2011-04-15 | Discharge: 2011-04-15 | Disposition: A | Discharge status: Home / Self Care | Payer: BC Managed Care – PPO | Source: Ambulatory Visit | Attending: Internal Medicine | Admitting: Internal Medicine

## 2011-04-15 DIAGNOSIS — IMO0002 Reserved for concepts with insufficient information to code with codable children: Secondary | ICD-10-CM

## 2011-04-17 ENCOUNTER — Telehealth: Payer: Self-pay

## 2011-04-17 NOTE — Telephone Encounter (Signed)
MRI was faxed to Dr.Stern at [phone]

## 2011-04-17 NOTE — Telephone Encounter (Signed)
Message copied by Edgardo Roys on Wed Apr 17, 2011  9:04 AM ------      Message from: Pecola Lawless      Created: Tue Apr 16, 2011  6:40 PM       Please fax MRI  & the office notes to her neurosurgeon

## 2011-04-22 ENCOUNTER — Encounter: Payer: Self-pay | Admitting: Internal Medicine

## 2011-04-23 ENCOUNTER — Other Ambulatory Visit: Payer: Self-pay | Admitting: Internal Medicine

## 2011-04-23 MED ORDER — HYDROCODONE-ACETAMINOPHEN 7.5-500 MG PO TABS: 1.0000 | ORAL_TABLET | ORAL | Status: AC | PRN

## 2011-04-23 NOTE — Telephone Encounter (Signed)
Dr.Hopper please advise , this was originally prescribed by you. Patient will not see Dr.Stern until later (he will return to office in Aug). Patient was given # 30 2 weeks ago. 7.5-500mg , 1 by mouth every 4 hour as needed  [facility]

## 2011-04-23 NOTE — Telephone Encounter (Signed)
Patient was referred to dr Venetia Maxon - he is out of the office until [id number] - patient wants refill for hydro-codone

## 2011-04-23 NOTE — Telephone Encounter (Signed)
Pt aware Rx sent to pharmacy 

## 2011-04-23 NOTE — Telephone Encounter (Signed)
Refill #30

## 2011-04-29 ENCOUNTER — Other Ambulatory Visit: Payer: Self-pay | Admitting: Gynecology

## 2011-05-05 ENCOUNTER — Other Ambulatory Visit: Payer: Self-pay | Admitting: Internal Medicine

## 2011-05-06 NOTE — Telephone Encounter (Signed)
Left message on voicemail for patient to return call when available, reason for call-? Who rx'ed abilify (not on med list)

## 2011-05-07 NOTE — Telephone Encounter (Signed)
Left message on voicemail at home number   Called patient on cell phone, and indicated she requested med in error-patient stated she was given this over a year ago by Dr.Hopper and stopped it.

## 2011-05-29 ENCOUNTER — Other Ambulatory Visit: Payer: Self-pay | Admitting: *Deleted

## 2011-05-29 MED ORDER — DULOXETINE HCL 60 MG PO CPEP: 60.0000 mg | ORAL_CAPSULE | Freq: Every day | ORAL | Status: DC

## 2011-06-20 ENCOUNTER — Encounter (HOSPITAL_COMMUNITY)
Admission: RE | Admit: 2011-06-20 | Discharge: 2011-06-20 | Disposition: A | Discharge status: Home / Self Care | Payer: BC Managed Care – PPO | Source: Ambulatory Visit | Attending: Neurosurgery | Admitting: Neurosurgery

## 2011-06-20 LAB — CBC
HCT: 37.6 % (ref 36.0–46.0)
Hemoglobin: 12.4 g/dL (ref 12.0–15.0)
MCV: 96.9 fL (ref 78.0–100.0)
RDW: 13 % (ref 11.5–15.5)

## 2011-06-20 LAB — SURGICAL PCR SCREEN: MRSA, PCR: NEGATIVE

## 2011-06-28 ENCOUNTER — Inpatient Hospital Stay (HOSPITAL_COMMUNITY): Payer: BC Managed Care – PPO

## 2011-06-28 ENCOUNTER — Inpatient Hospital Stay (HOSPITAL_COMMUNITY)
Admission: RE | Admit: 2011-06-28 | Discharge: 2011-07-01 | DRG: 756 | Disposition: A | Discharge status: Home / Self Care | Payer: BC Managed Care – PPO | Source: Ambulatory Visit | Attending: Neurosurgery | Admitting: Neurosurgery

## 2011-06-28 DIAGNOSIS — Z01812 Encounter for preprocedural laboratory examination: Secondary | ICD-10-CM

## 2011-06-28 DIAGNOSIS — F329 Major depressive disorder, single episode, unspecified: Secondary | ICD-10-CM | POA: Diagnosis present

## 2011-06-28 DIAGNOSIS — M48061 Spinal stenosis, lumbar region without neurogenic claudication: Secondary | ICD-10-CM | POA: Diagnosis present

## 2011-06-28 DIAGNOSIS — M4120 Other idiopathic scoliosis, site unspecified: Secondary | ICD-10-CM | POA: Diagnosis present

## 2011-06-28 DIAGNOSIS — Z01818 Encounter for other preprocedural examination: Secondary | ICD-10-CM

## 2011-06-28 DIAGNOSIS — F3289 Other specified depressive episodes: Secondary | ICD-10-CM | POA: Diagnosis present

## 2011-06-28 DIAGNOSIS — M5126 Other intervertebral disc displacement, lumbar region: Principal | ICD-10-CM | POA: Diagnosis present

## 2011-06-28 DIAGNOSIS — M4310 Spondylolisthesis, site unspecified: Secondary | ICD-10-CM | POA: Diagnosis present

## 2011-06-28 LAB — COMPREHENSIVE METABOLIC PANEL
ALT: 10 U/L (ref 0–35)
Alkaline Phosphatase: 42 U/L (ref 39–117)
BUN: 10 mg/dL (ref 6–23)
CO2: 30 mEq/L (ref 19–32)
GFR calc Af Amer: >60 mL/min (ref >60)
GFR calc non Af Amer: >60 mL/min (ref >60)
Glucose, Bld: 118 mg/dL — ABNORMAL HIGH (ref 70–99)
Potassium: 3.9 mEq/L (ref 3.5–5.1)
Sodium: 139 mEq/L (ref 135–145)

## 2011-06-28 LAB — CBC
HCT: 27.5 % — ABNORMAL LOW (ref 36.0–46.0)
Hemoglobin: 9 g/dL — ABNORMAL LOW (ref 12.0–15.0)
MCH: 32.3 pg (ref 26.0–34.0)
RBC: 2.79 MIL/uL — ABNORMAL LOW (ref 3.87–5.11)

## 2011-06-30 LAB — CBC
MCHC: 32.8 g/dL (ref 30.0–36.0)
Platelets: 155 10*3/uL (ref 150–400)
RDW: 12.7 % (ref 11.5–15.5)

## 2011-07-09 NOTE — Op Note (Signed)
Jessica, Casey NO.:  [id number]  MEDICAL RECORD NO.:  [id number]  LOCATION:  3007                         FACILITY:  [facility]  PHYSICIAN:  Danae Orleans. Venetia Maxon, M.D.  DATE OF BIRTH:  10-Jul-1951  DATE OF PROCEDURE:  06/28/2011 DATE OF DISCHARGE:                              OPERATIVE REPORT   PREOPERATIVE DIAGNOSES:  Herniated lumbar disk with spondylolisthesis, scoliosis, stenosis, and low back pain L4-5 level.  POSTOPERATIVE DIAGNOSES:  Herniated lumbar disk with spondylolisthesis, scoliosis, stenosis, and low back pain L4-5 level.  PROCEDURES: 1. Exploration of fusion L3-L4 level. 2. Decompression in excess of standard post lumbar interbody fusion L4-     L5 levels. 3. Posterior lumbar interbody fusion with PEEK interbody cages,     morselized bone autograft, and NEXoss allograft bone graft     material. 4. Pedicle screw fixation L4-L5 levels. 5. Posterolateral arthrodesis L3-L5 levels.  SURGEON:  Danae Orleans. Venetia Maxon, MD  ASSISTANT:  Georgiann Cocker, RN and Hilda Lias, MD  ANESTHESIA:  General endotracheal anesthesia.  ESTIMATED BLOOD LOSS:  Minimal.  COMPLICATIONS:  None.  DISPOSITION:  Recovery.  INDICATIONS:  Claudine Stallings is a 60 year old woman who 5 years ago had undergone decompression and fusion at the L3-4 level.  She has now developed spondylolisthesis with a large disk herniation and severe spinal stenosis along with progression of scoliosis at the L4-5 level. We elected to take her surgery to ascertain solid arthrodesis at previously operated level and then to decompress and fuse L4-5 level.  PROCEDURE:  Jessica Casey was brought to the operating room.  Following satisfactory and uncomplicated induction of general endotracheal anesthesia and placement of intravenous lines and Foley catheter, the patient was placed in prone position on the [place] table.  Low back was prepped and draped in the usual sterile fashion.  Soft tissue and  bony prominences were padded appropriately.  Previous incision was reopened, carried to the lumbodorsal fascia, was incised bilaterally. Subperiosteal dissection was performed exposing the previously placed pedicle screw fixation at L3-L4 levels.  These screws were then removed along with the rods.  The previously placed bone graft material was identified and I was not able to detect significant motion between the L3-4 level.  Subsequently exposure was then performed to the L5 transverse processes bilaterally and after confirmatory radiograph was obtained with marker probes in the L4 pedicle, the L5 transverse process, bilateral laminectomies of L4 were performed with high-speed drill with removal of inferior facet at L4 and subsequently wide decompression of the superior facet of L5.  The patient had extremely large disk herniation at the L4-5 level, this was initially approached on the left side and subsequently on the right as well and thorough decompression under loupe magnification of the L4-L5 nerve roots along with common dural tube was then performed.  There were large amount of fragments of herniated disk material, which were migrated cephalad behind the anterior listhesis body of L4 and this was thoroughly decompressed and multiple fragments of disk material were removed. After trial spacer was placed on the right at the L3-4 level, thorough additional decompression was performed and then the endplates were prepared for grafting.  NEXoss bone  graft was then placed with along with bone autograft, which had been locally harvested and run through the bone mill and an 8-mm PEEK interbody cage was inserted on the left side and countersunk appropriately.  Attention was then turned to the right side where similar decompression and placement of bone graft material and then interbody graft was then placed as well.  After C-arm fluoroscopy was then brought in and marker probes were placed  at the L4 pedicles and L5 and subsequently using AP and lateral C-arm fluoroscopy, the 6.5 x 40 mm screws were placed at L4, 6.5 x 40 mm screws were placed at L5, all screws had excellent purchase.  No evidence of any cutouts. Final x-ray demonstrated well-positioning of interbody grafts and pedicle screw fixation in both AP and lateral projections.  A 40-mm preloaded rods were then affixed to the screw heads and locked down in situ.  The posterolateral region was extensively decorticated and then the remaining NEXoss and autograft was then packed on the right side of midline and on the left side excess bone pack was placed and tamped into position.  The wound had been extensively irrigated and subsequently self-retaining retractor was removed.  The lumbodorsal fascia was closed with 1-Vicryl suture, subcutaneous tissues were approximated with 2-0 Vicryl interrupted inverted sutures, and skin edges were reapproximated with 3-0 Vicryl subcuticular stitch.  The wound was dressed with Benzoin, Steri-Strips, Telfa gauze, and tape.  The patient was extubated in the operating room, taken to the recovery room in a stable and satisfactory condition having tolerated the operation well.  Counts were correct at the end of the case.     Danae Orleans. Venetia Maxon, M.D.     JDS/MEDQ  D:  06/28/2011  T:  06/28/2011  Job:  [id number]  Electronically Signed by Maeola Harman M.D. on 07/09/2011 03:53:43 PM

## 2011-07-16 NOTE — Discharge Summary (Signed)
  NAMEJAYONA, MCCAIG NO.:  [id number]  MEDICAL RECORD NO.:  [id number]  LOCATION:  3007                         FACILITY:  [facility]  PHYSICIAN:  Danae Orleans. Venetia Maxon, M.D.  DATE OF BIRTH:  [birth year]  DATE OF ADMISSION:  06/28/2011 DATE OF DISCHARGE:  07/02/2011                              DISCHARGE SUMMARY   REASON FOR ADMISSION:  Lumbar disk herniation with severe spinal stenosis, scoliosis, spondylolisthesis, and stenosis.  FINAL DIAGNOSES:  Lumbar disk herniation with severe spinal stenosis, scoliosis, spondylolisthesis, and stenosis.  HISTORY OF ILLNESS AND HOSPITAL COURSE:  Jessica Casey is a 60 year old woman who had undergone a previous fusion at the L3-L4 level.  She has now developed spondylolisthesis with a large disk herniation and severe spinal stenosis with progression of scoliosis at L4-L5.  It was elected to take the patient to Surgery to ascertain solid arthrodesis at the previously operated level and go ahead and perform decompression and fusion at the L4-L5 level.  Jessica Casey was brought to the operating room on same day's admission basis and underwent uncomplicated decompression and fusion at the L4-L5 level.  Postoperatively, she was doing well and gradually mobilized.  She was up and walking in back brace, did have some postoperative episode of tachycardia, and was discharged home with Percocet and Flexeril for pain medication along with preoperative medications of Biotin, MegaRed OTC, aspirin 81 mg, vitamin D3, hydrocodone, alendronate, Cymbalta, and Citracal with instructions to wear back brace and follow up in the office in 3 weeks postoperatively for a wound check and x-rays.     Danae Orleans. Venetia Maxon, M.D.     JDS/MEDQ  D:  07/11/2011  T:  07/11/2011  Job:  [id number]  Electronically Signed by Maeola Harman M.D. on 07/16/2011 11:44:16 AM

## 2011-10-01 HISTORY — PX: OTHER SURGICAL HISTORY: SHX169

## 2011-10-01 HISTORY — PX: CATARACT EXTRACTION, BILATERAL: SHX1313

## 2011-11-13 ENCOUNTER — Other Ambulatory Visit: Payer: Self-pay | Admitting: Internal Medicine

## 2012-02-11 ENCOUNTER — Other Ambulatory Visit: Payer: Self-pay | Admitting: Internal Medicine

## 2012-02-11 NOTE — Telephone Encounter (Signed)
Patient needs to schedule a CPX 03/2012

## 2012-02-25 ENCOUNTER — Encounter: Payer: Self-pay | Admitting: Internal Medicine

## 2012-02-25 ENCOUNTER — Ambulatory Visit (INDEPENDENT_AMBULATORY_CARE_PROVIDER_SITE_OTHER): Payer: BC Managed Care – PPO | Admitting: Internal Medicine

## 2012-02-25 VITALS — BP 100/62 | HR 85 | Temp 99.1°F | Wt 124.0 lb

## 2012-02-25 DIAGNOSIS — H6690 Otitis media, unspecified, unspecified ear: Secondary | ICD-10-CM

## 2012-02-25 DIAGNOSIS — J209 Acute bronchitis, unspecified: Secondary | ICD-10-CM

## 2012-02-25 DIAGNOSIS — H6692 Otitis media, unspecified, left ear: Secondary | ICD-10-CM

## 2012-02-25 MED ORDER — NEOMYCIN-POLYMYXIN-HC 3.5-10000-1 OT SUSP: 3.0000 [drp] | Freq: Four times a day (QID) | OTIC | Status: DC

## 2012-02-25 MED ORDER — AMOXICILLIN-POT CLAVULANATE 875-125 MG PO TABS: 1.0000 | ORAL_TABLET | Freq: Two times a day (BID) | ORAL | Status: AC

## 2012-02-25 NOTE — Patient Instructions (Signed)
Plain Mucinex for thick secretions ;force NON dairy fluids . Use a Neti pot daily as needed for sinus congestion; going from open side to congested side . Nasal cleansing in the shower as discussed. Make sure that all residual soap is removed to prevent irritation.  

## 2012-02-25 NOTE — Progress Notes (Signed)
  Subjective:    Patient ID: Jessica Casey, female    DOB: 02-Jul-1951, 61 y.o.   MRN: [id number]  HPI Symptoms began 02/22/12 as a sore throat; she suddenly developed chest congestion. She has not had significant head congestion but has pressure in her head when spine. She has had an associated fever with temperature up to 100.5 which she took nonsteroidals.  She's had yellow-green sputum without associated shortness of breath and wheezing      Review of Systems Actually 1-2 weeks ago she began to have pressure in the left ear with associated hearing loss. She denies any discharge, tinnitus or pain She has no nasal congestion/obstruction; nasal purulence; facial pain; anosmia; frontal headache; halitosis;  and dental pain.    Objective:   Physical Exam General appearance:good health ;well nourished; no acute distress or increased work of breathing is present.  No  lymphadenopathy about the head, neck, or axilla noted.   Eyes: No conjunctival inflammation or lid edema is present.   Ears:  External ear exam shows no significant lesions or deformities.  Left tympanic membrane is erythematous and dull without discharge . Nose:  External nasal examination shows no deformity or inflammation. Nasal mucosa are pink and moist without lesions or exudates. No septal dislocation or deviation.No obstruction to airflow.   Oral exam: Dental hygiene is good; lips and gums are healthy appearing.There is no oropharyngeal erythema or exudate noted.   Heart:  Normal rate and regular rhythm. S1 and S2 normal without gallop, murmur, click, rub or other extra sounds.   Lungs:Chest clear to auscultation; no wheezes, rhonchi,rales ,or rubs present.No increased work of breathing.    Extremities:  No cyanosis, edema, or clubbing  noted    Skin: Warm & dry           Assessment & Plan:  #1 acute bronchitis w/o bronchospasm #2 OM Plan: See orders and recommendations

## 2012-02-26 ENCOUNTER — Other Ambulatory Visit: Payer: Self-pay | Admitting: Internal Medicine

## 2012-02-26 NOTE — Telephone Encounter (Signed)
Pt left msg on triage vmail stating she came in yesterday & got an antibiotic for her ear infection. Pt stated she used it once & lost the med. She would like to know if she can get a refill. Please advise.

## 2012-04-13 ENCOUNTER — Ambulatory Visit (INDEPENDENT_AMBULATORY_CARE_PROVIDER_SITE_OTHER): Payer: BC Managed Care – PPO | Admitting: Internal Medicine

## 2012-04-13 ENCOUNTER — Encounter: Payer: Self-pay | Admitting: Internal Medicine

## 2012-04-13 VITALS — BP 118/70 | HR 87 | Temp 98.4°F | Wt 127.4 lb

## 2012-04-13 DIAGNOSIS — H9190 Unspecified hearing loss, unspecified ear: Secondary | ICD-10-CM

## 2012-04-13 DIAGNOSIS — H6980 Other specified disorders of Eustachian tube, unspecified ear: Secondary | ICD-10-CM

## 2012-04-13 DIAGNOSIS — H6982 Other specified disorders of Eustachian tube, left ear: Secondary | ICD-10-CM

## 2012-04-13 DIAGNOSIS — H9192 Unspecified hearing loss, left ear: Secondary | ICD-10-CM

## 2012-04-13 MED ORDER — FLUTICASONE PROPIONATE 50 MCG/ACT NA SUSP: 1.0000 | Freq: Two times a day (BID) | NASAL | Status: DC | PRN

## 2012-04-13 MED ORDER — PREDNISONE 20 MG PO TABS: 20.0000 mg | ORAL_TABLET | Freq: Two times a day (BID) | ORAL | Status: AC

## 2012-04-13 NOTE — Patient Instructions (Addendum)
Go to [facility] for eustachian tube dysfunction. Drink thin  fluids liberally through the day and chew sugarless gum . Do the Valsalva maneuver several times a day to "pop" ears open. Flonase 1 spray in each nostril twice a day as needed. Use the "crossover" technique as discussed. Use a Neti pot daily- 2x /day  as needed for sinus congestion  

## 2012-04-13 NOTE — Progress Notes (Signed)
  Subjective:    Patient ID: Jessica Casey, female    DOB: 08-21-1951, 61 y.o.   MRN: [id number]  HPI Her acute bronchitic symptoms for which she was seen 528/13 have resolved but she continues to have some symptoms in the left ear manifested as decreased hearing and a sensation of fullness. There's been no associated fever, chills, earache, or discharge. She completed the entire course of Cortisporin otic and generic Augmentin.  She did fly during the first week of June without any increase in the symptoms    Review of Systems She denies frontal headache, facial pain, or nasal purulence     Objective:   Physical Exam General appearance:good health ;well nourished; no acute distress or increased work of breathing is present.  No  lymphadenopathy about the head, neck, or axilla noted.   Eyes: No conjunctival inflammation or lid edema is present.   Ears:  External ear exam shows no significant lesions or deformities.  Otoscopic examination reveals clear canals, tympanic membranes are intact bilaterally without bulging, retraction, inflammation or discharge. The left tympanic membrane is pinker than the right with slightly decreased light reflex. Hearing to whisper at 6 feet is definitely decreased on the left  Nose:  External nasal examination shows no deformity or inflammation. Nasal mucosa are pink and moist without lesions or exudates. No septal dislocation or deviation.No obstruction to airflow.   Oral exam: Dental hygiene is good; lips and gums are healthy appearing.There is no oropharyngeal erythema or exudate noted.   Neck:  No deformities,  masses, or tenderness noted.      Heart:  Normal rate and regular rhythm. S1 and S2 normal without gallop, murmur, click, rub . S4 Lungs:Chest clear to auscultation; no wheezes, rhonchi,rales ,or rubs present.No increased work of breathing.    Extremities:  No cyanosis, edema, or clubbing  noted    Skin: Warm & dry           Assessment  & Plan:  #1 eustachian tube dysfunction  Plan: See orders &  recommendations

## 2012-04-28 ENCOUNTER — Telehealth: Payer: Self-pay | Admitting: Internal Medicine

## 2012-04-28 ENCOUNTER — Other Ambulatory Visit: Payer: Self-pay | Admitting: Internal Medicine

## 2012-04-28 DIAGNOSIS — Z86010 Personal history of colonic polyps: Secondary | ICD-10-CM

## 2012-04-28 NOTE — Telephone Encounter (Signed)
Caller: [place]/Patient; PCP: Marga Melnick; CB#: [phone].Call regarding Needs Referral  Caller reports she had a Colonoscopy 08/30/2005 and was directed to have another one in 5 yrs since they found polyps and  family hx of same. Caller reports she spoke with PCP and Gyn at 5-6 yr point and was advised they did not see record of recommendation. She has since found the info from [facility] with directions/recommendations for 5 yr follow up. She would like referral for same.

## 2012-04-29 ENCOUNTER — Telehealth: Payer: Self-pay | Admitting: Gastroenterology

## 2012-04-30 NOTE — Telephone Encounter (Signed)
Left message for pt to call back  °

## 2012-04-30 NOTE — Telephone Encounter (Signed)
Now

## 2012-04-30 NOTE — Telephone Encounter (Signed)
Pt was told she needed to have a colon recall for 2016. Pt found paperwork from her colon in 2006 and she states it says she should have a repeat colon in 5 years. Path report is in epic dated 08/19/05. Dr. Arlyce Dice please clarify when she should have recall.

## 2012-04-30 NOTE — Telephone Encounter (Signed)
Does she need it now or in 2016?

## 2012-04-30 NOTE — Telephone Encounter (Signed)
She is correct.  Adenomatous polyp 2006.  She is due to recall colo.

## 2012-05-01 NOTE — Telephone Encounter (Signed)
Please schedule pt for colon recall per Dr. Arlyce Dice.

## 2012-05-02 ENCOUNTER — Other Ambulatory Visit: Payer: Self-pay | Admitting: Internal Medicine

## 2012-06-12 ENCOUNTER — Ambulatory Visit (AMBULATORY_SURGERY_CENTER): Payer: BC Managed Care – PPO

## 2012-06-12 VITALS — Ht 60.0 in | Wt 125.1 lb

## 2012-06-12 DIAGNOSIS — Z1211 Encounter for screening for malignant neoplasm of colon: Secondary | ICD-10-CM

## 2012-06-15 ENCOUNTER — Encounter: Payer: Self-pay | Admitting: Gastroenterology

## 2012-06-25 ENCOUNTER — Encounter: Payer: Self-pay | Admitting: Gastroenterology

## 2012-06-25 ENCOUNTER — Ambulatory Visit (AMBULATORY_SURGERY_CENTER): Payer: BC Managed Care – PPO | Admitting: Gastroenterology

## 2012-06-25 VITALS — BP 100/68 | HR 88 | Temp 97.8°F | Resp 19 | Ht 60.0 in | Wt 125.0 lb

## 2012-06-25 DIAGNOSIS — Z1211 Encounter for screening for malignant neoplasm of colon: Secondary | ICD-10-CM

## 2012-06-25 DIAGNOSIS — K5730 Diverticulosis of large intestine without perforation or abscess without bleeding: Secondary | ICD-10-CM

## 2012-06-25 DIAGNOSIS — D129 Benign neoplasm of anus and anal canal: Secondary | ICD-10-CM

## 2012-06-25 DIAGNOSIS — D126 Benign neoplasm of colon, unspecified: Secondary | ICD-10-CM

## 2012-06-25 DIAGNOSIS — D128 Benign neoplasm of rectum: Secondary | ICD-10-CM

## 2012-06-25 MED ORDER — SODIUM CHLORIDE 0.9 % IV SOLN: 500.0000 mL | INTRAVENOUS | Status: DC

## 2012-06-25 NOTE — Patient Instructions (Addendum)
YOU HAD AN ENDOSCOPIC PROCEDURE TODAY AT THE [facility]: Refer to the procedure report that was given to you for any specific questions about what was found during the examination.  If the procedure report does not answer your questions, please call your gastroenterologist to clarify.  If you requested that your care partner not be given the details of your procedure findings, then the procedure report has been included in a sealed envelope for you to review at your convenience later.  YOU SHOULD EXPECT: Some feelings of bloating in the abdomen. Passage of more gas than usual.  Walking can help get rid of the air that was put into your GI tract during the procedure and reduce the bloating. If you had a lower endoscopy (such as a colonoscopy or flexible sigmoidoscopy) you may notice spotting of blood in your stool or on the toilet paper. If you underwent a bowel prep for your procedure, then you may not have a normal bowel movement for a few days.  DIET: Your first meal following the procedure should be a light meal and then it is ok to progress to your normal diet.  A half-sandwich or bowl of soup is an example of a good first meal.  Heavy or fried foods are harder to digest and may make you feel nauseous or bloated.  Likewise meals heavy in dairy and vegetables can cause extra gas to form and this can also increase the bloating.  Drink plenty of fluids but you should avoid alcoholic beverages for 24 hours.  ACTIVITY: Your care partner should take you home directly after the procedure.  You should plan to take it easy, moving slowly for the rest of the day.  You can resume normal activity the day after the procedure however you should NOT DRIVE or use heavy machinery for 24 hours (because of the sedation medicines used during the test).    SYMPTOMS TO REPORT IMMEDIATELY: A gastroenterologist can be reached at any hour.  During normal business hours, 8:30 AM to 5:00 PM Monday through Friday,  call [phone].  After hours and on weekends, please call the GI answering service at [phone] who will take a message and have the physician on call contact you.   Following lower endoscopy (colonoscopy or flexible sigmoidoscopy):  Excessive amounts of blood in the stool  Significant tenderness or worsening of abdominal pains  Swelling of the abdomen that is new, acute  Fever of 100F or higher  FOLLOW UP: If any biopsies were taken you will be contacted by phone or by letter within the next 1-3 weeks.  Call your gastroenterologist if you have not heard about the biopsies in 3 weeks.  Our staff will call the home number listed on your records the next business day following your procedure to check on you and address any questions or concerns that you may have at that time regarding the information given to you following your procedure. This is a courtesy call and so if there is no answer at the home number and we have not heard from you through the emergency physician on call, we will assume that you have returned to your regular daily activities without incident.  SIGNATURES/CONFIDENTIALITY: You and/or your care partner have signed paperwork which will be entered into your electronic medical record.  These signatures attest to the fact that that the information above on your After Visit Summary has been reviewed and is understood.  Full responsibility of the confidentiality of this   discharge information lies with you and/or your care-partner.   Resume medications. Information given on polyps,diverticulosis, high fiber diet with discharge instructions. 

## 2012-06-25 NOTE — Progress Notes (Signed)
The pt tolerated the colonoscopy very well. Maw   

## 2012-06-25 NOTE — Progress Notes (Signed)
Patient did not have preoperative order for IV antibiotic SSI prophylaxis. (G8918)  Patient did not experience any of the following events: a burn prior to discharge; a fall within the facility; wrong site/side/patient/procedure/implant event; or a hospital transfer or hospital admission upon discharge from the facility. (G8907)  

## 2012-06-25 NOTE — Op Note (Signed)
[facility] [address] [place], [address]   COLONOSCOPY PROCEDURE REPORT  PATIENT: Jessica, Casey  MR#: [id number] BIRTHDATE: [birth year] , 61  yrs. old GENDER: Female ENDOSCOPIST: Louis Meckel, MD REFERRED BY: PROCEDURE DATE:  06/25/2012 PROCEDURE:   Colonoscopy with snare polypectomy and Colonoscopy with cold biopsy polypectomy ASA CLASS:   Class II INDICATIONS: MEDICATIONS: MAC sedation, administered by CRNA and Propofol (Diprivan) 120 mg IV  DESCRIPTION OF PROCEDURE:   After the risks benefits and alternatives of the procedure were thoroughly explained, informed consent was obtained.  A digital rectal exam revealed no abnormalities of the rectum.   The LB [id number]  endoscope was introduced through the anus and advanced to the cecum, which was identified by both the appendix and ileocecal valve. No adverse events experienced.   The quality of the prep was Suprep good  The instrument was then slowly withdrawn as the colon was fully examined.      COLON FINDINGS: A sessile polyp measuring 4 mm in size was found.  A polypectomy was performed with a cold snare.  The resection was complete and the polyp tissue was completely retrieved.   A sessile polyp measuring 2 mm in size was found in the rectum.  A polypectomy was performed with cold forceps.  The resection was complete and the polyp tissue was completely retrieved.   Moderate diverticulosis was noted in the sigmoid colon and descending colon. The colon mucosa was otherwise normal.  Retroflexed views revealed no abnormalities. The time to cecum=3 minutes 33 seconds. Withdrawal time=8 minutes 50 seconds.  The scope was withdrawn and the procedure completed. COMPLICATIONS: There were no complications.  ENDOSCOPIC IMPRESSION: 1.   Sessile polyp measuring 4 mm in size was found; polypectomy was performed with a cold snare 2.   Sessile polyp measuring 2 mm in size was found in the  rectum; polypectomy was performed with cold forceps 3.   Moderate diverticulosis was noted in the sigmoid colon and descending colon 4.   The colon mucosa was otherwise normal  RECOMMENDATIONS: If the polyp(s) removed today are proven to be adenomatous (pre-cancerous) polyps, you will need a repeat colonoscopy in 5 years.  Otherwise you should continue to follow colorectal cancer screening guidelines for "routine risk" patients with colonoscopy in 10 years.  You will receive a letter within 1-2 weeks with the results of your biopsy as well as final recommendations.  Please call my office if you have not received a letter after 3 weeks.   eSigned:  Louis Meckel, MD 06/25/2012 11:05 AM   cc: Pecola Lawless, MD

## 2012-06-26 ENCOUNTER — Telehealth: Payer: Self-pay | Admitting: *Deleted

## 2012-06-26 NOTE — Telephone Encounter (Signed)
  Follow up Call-  Call back number 06/25/2012  Post procedure Call Back phone  # [phone]  Permission to leave phone message Yes     Patient questions:  Do you have a fever, pain , or abdominal swelling? no Pain Score  0 *  Have you tolerated food without any problems? yes  Have you been able to return to your normal activities? yes  Do you have any questions about your discharge instructions: Diet   no Medications  no Follow up visit  no  Do you have questions or concerns about your Care? no  Actions: * If pain score is 4 or above: No action needed, pain <4.

## 2012-07-01 ENCOUNTER — Encounter: Payer: Self-pay | Admitting: Gastroenterology

## 2012-08-24 ENCOUNTER — Other Ambulatory Visit: Payer: Self-pay | Admitting: Neurosurgery

## 2012-08-24 DIAGNOSIS — M549 Dorsalgia, unspecified: Secondary | ICD-10-CM

## 2012-08-24 DIAGNOSIS — M479 Spondylosis, unspecified: Secondary | ICD-10-CM

## 2012-08-25 ENCOUNTER — Ambulatory Visit
Admission: RE | Admit: 2012-08-25 | Discharge: 2012-08-25 | Disposition: A | Discharge status: Home / Self Care | Payer: BC Managed Care – PPO | Source: Ambulatory Visit | Attending: Neurosurgery | Admitting: Neurosurgery

## 2012-08-25 DIAGNOSIS — M549 Dorsalgia, unspecified: Secondary | ICD-10-CM

## 2012-08-25 DIAGNOSIS — M479 Spondylosis, unspecified: Secondary | ICD-10-CM

## 2012-08-25 MED ORDER — GADOBENATE DIMEGLUMINE 529 MG/ML IV SOLN
10.0000 mL | Freq: Once | INTRAVENOUS | Status: AC | PRN
  Administered 2012-08-25: 10 mL via INTRAVENOUS

## 2012-09-30 HISTORY — PX: CERVICAL FUSION: SHX112

## 2012-11-14 ENCOUNTER — Other Ambulatory Visit: Payer: Self-pay

## 2013-02-17 ENCOUNTER — Encounter: Payer: Self-pay | Admitting: Internal Medicine

## 2013-02-17 ENCOUNTER — Ambulatory Visit (INDEPENDENT_AMBULATORY_CARE_PROVIDER_SITE_OTHER): Payer: BC Managed Care – PPO | Admitting: Internal Medicine

## 2013-02-17 VITALS — BP 118/72 | HR 92 | Wt 131.0 lb

## 2013-02-17 DIAGNOSIS — M810 Age-related osteoporosis without current pathological fracture: Secondary | ICD-10-CM

## 2013-02-17 DIAGNOSIS — R5381 Other malaise: Secondary | ICD-10-CM

## 2013-02-17 DIAGNOSIS — R5383 Other fatigue: Secondary | ICD-10-CM

## 2013-02-17 DIAGNOSIS — E8809 Other disorders of plasma-protein metabolism, not elsewhere classified: Secondary | ICD-10-CM | POA: Insufficient documentation

## 2013-02-17 DIAGNOSIS — E559 Vitamin D deficiency, unspecified: Secondary | ICD-10-CM | POA: Insufficient documentation

## 2013-02-17 DIAGNOSIS — F329 Major depressive disorder, single episode, unspecified: Secondary | ICD-10-CM

## 2013-02-17 DIAGNOSIS — D649 Anemia, unspecified: Secondary | ICD-10-CM

## 2013-02-17 LAB — CBC WITH DIFFERENTIAL/PLATELET
Basophils Absolute: 0 10*3/uL (ref 0.0–0.1)
Eosinophils Absolute: 0.1 10*3/uL (ref 0.0–0.7)
Lymphocytes Relative: 27 % (ref 12.0–46.0)
MCHC: 33 g/dL (ref 30.0–36.0)
Monocytes Relative: 6.5 % (ref 3.0–12.0)
Neutrophils Relative %: 64.1 % (ref 43.0–77.0)
RDW: 14.3 % (ref 11.5–14.6)

## 2013-02-17 LAB — LIPID PANEL
HDL: 59.2 mg/dL (ref >39.00)
LDL Cholesterol: 124 mg/dL — ABNORMAL HIGH (ref 0–99)
Total CHOL/HDL Ratio: 3
VLDL: 11.4 mg/dL (ref 0.0–40.0)

## 2013-02-17 LAB — BASIC METABOLIC PANEL
CO2: 30 mEq/L (ref 19–32)
Calcium: 8.9 mg/dL (ref 8.4–10.5)
Creatinine, Ser: 0.9 mg/dL (ref 0.4–1.2)
GFR: 70.85 mL/min (ref >60.00)
Glucose, Bld: 85 mg/dL (ref 70–99)

## 2013-02-17 LAB — IBC PANEL
Saturation Ratios: 33.5 % (ref 20.0–50.0)
Transferrin: 273.2 mg/dL (ref 212.0–360.0)

## 2013-02-17 LAB — HEPATIC FUNCTION PANEL
Albumin: 3.8 g/dL (ref 3.5–5.2)
Alkaline Phosphatase: 55 U/L (ref 39–117)
Bilirubin, Direct: 0 mg/dL (ref 0.0–0.3)
Total Bilirubin: 0.6 mg/dL (ref 0.3–1.2)

## 2013-02-17 LAB — TSH: TSH: 1.41 u[IU]/mL (ref 0.35–5.50)

## 2013-02-17 MED ORDER — ARIPIPRAZOLE 5 MG PO TABS: 5.0000 mg | ORAL_TABLET | Freq: Every day | ORAL | Status: DC

## 2013-02-17 MED ORDER — DULOXETINE HCL 60 MG PO CPEP: ORAL_CAPSULE | ORAL | Status: DC

## 2013-02-17 NOTE — Progress Notes (Signed)
Subjective:    Patient ID: Jessica Casey, female    DOB: 01-Aug-1951, 62 y.o.   MRN: [id number]  HPI  Her major issue is depression and fatigue. Cymbalta previously was of benefit. She states that when she's been on medication for a while she becomes tolerant to it and "symptoms creep back in".  Her appetite is good; she is actually gaining weight. She has no sleep issues at this time.  The last 3 months she's had intermittent diplopia. This was a problem prior to her cataract surgery in February 2013. She has discussed this with Dr. Nile Riggs, her ophthalmologist.  She has occasional palpitations but this is in the context of past history of mitral valve prolapse.  Her family history is strongly positive for depression, most notably among 4 female siblings and one brother.  Last labs in the chart revealed a hematocrit of 28.7. Glucose was mildly elevated 118. Albumin was reduced to 2.5 and total protein 4.7. As expected calcium was also reduced at 7.8.    Review of Systems  She has no other visual symptoms such as blurred vision or loss of vision.  Hoarseness and swallowing dysfunction are not present  There is no associated shortness of breath or edema.  She takes Biotin for chronic nail instability. There no other hair or skin changes.  She has no constipation, diarrhea, melena,or  rectal bleeding    Dr Greta Doom Rxed a bisphosphonate in 2012. She had taken Evista for 12 years following menopause.  She had recurrence of low back pain in late 2013; physical therapy was not particularly of value.  She's had intermittent numbness and tingling in the right upperextremity; she has not consulted her neurosurgeon. She's had cervical fusion on 2 occasions.         Objective:   Physical Exam  Gen.: Healthy and well-nourished in appearance. Alert, appropriate and cooperative throughout exam.  Head: Normocephalic without obvious abnormalities; no  alopecia  Eyes: No corneal or  conjunctival inflammation noted. Extraocular motion intact. Vision grossly normal without lenses. No proptosis or lid lag present. Ears:  Hearing is grossly normal bilaterally. Nose: External nasal exam reveals no deformity or inflammation. Nasal mucosa are pink and moist. No lesions or exudates noted.  Mouth: Oral mucosa and oropharynx reveal no lesions or exudates. Teeth in good repair. Neck: No deformities, masses, or tenderness noted. Range of motion very good; Thyroid normal. Lungs: Normal respiratory effort; chest expands symmetrically. Lungs are clear to auscultation without rales, wheezes, or increased work of breathing. Heart: Normal rate and rhythm. Normal S1 and S2. No gallop,  or rub. No murmur.No definite click Abdomen: Bowel sounds normal; abdomen soft and nontender. No masses, organomegaly or hernias noted.                              Musculoskeletal/extremities: No clubbing, cyanosis, edema, or significant extremity  deformity noted. Range of motion normal .Tone & strength  Normal. Joints :DIP thumb changes . Nail health good. Able to lie down & sit up w/o help.  Vascular: Carotid, radial artery, dorsalis pedis and  posterior tibial pulses are full and equal. No bruits present. Neurologic: Alert and oriented x3. Deep tendon reflexes symmetrical and normal.        Skin: Intact without suspicious lesions or rashes. Lymph: No cervical, axillary lymphadenopathy present. Psych: Mood and affect are normal. Normally interactive  Assessment & Plan:  See Current Assessment & Plan in Problem List under specific Diagnosis

## 2013-02-17 NOTE — Assessment & Plan Note (Signed)
Abilify may be an appropriate addition as trial

## 2013-02-17 NOTE — Assessment & Plan Note (Signed)
Check vitamin D level 

## 2013-02-17 NOTE — Assessment & Plan Note (Signed)
Thyroid function will be rechecked as it is not current

## 2013-02-17 NOTE — Assessment & Plan Note (Signed)
CBC will be repeated with iron panel and B12 level

## 2013-02-17 NOTE — Patient Instructions (Addendum)
Please review the record and make any corrections; share this with all medical staff seen. If you note change or progression in symptoms please contact us through My Chart ASAP. This will allow us to respond as quickly as possible and schedule appropriate studies (blood test or imaging). 

## 2013-02-21 LAB — VITAMIN D 1,25 DIHYDROXY: Vitamin D2 1, 25 (OH)2: <8 pg/mL

## 2013-02-28 HISTORY — PX: WRIST FRACTURE SURGERY: SHX121

## 2013-03-04 ENCOUNTER — Emergency Department (INDEPENDENT_AMBULATORY_CARE_PROVIDER_SITE_OTHER): Payer: Worker's Compensation

## 2013-03-04 ENCOUNTER — Emergency Department (INDEPENDENT_AMBULATORY_CARE_PROVIDER_SITE_OTHER)
Admission: EM | Admit: 2013-03-04 | Discharge: 2013-03-04 | Disposition: A | Discharge status: Home / Self Care | Payer: Worker's Compensation | Source: Home / Self Care | Attending: Emergency Medicine | Admitting: Emergency Medicine

## 2013-03-04 ENCOUNTER — Encounter (HOSPITAL_COMMUNITY): Payer: Self-pay | Admitting: Emergency Medicine

## 2013-03-04 DIAGNOSIS — S5290XA Unspecified fracture of unspecified forearm, initial encounter for closed fracture: Secondary | ICD-10-CM

## 2013-03-04 DIAGNOSIS — S5292XA Unspecified fracture of left forearm, initial encounter for closed fracture: Secondary | ICD-10-CM

## 2013-03-04 MED ORDER — HYDROCODONE-ACETAMINOPHEN 5-325 MG PO TABS
1.0000 | ORAL_TABLET | Freq: Once | ORAL | Status: AC
  Administered 2013-03-04: 1 via ORAL

## 2013-03-04 MED ORDER — HYDROCODONE-ACETAMINOPHEN 5-325 MG PO TABS: ORAL_TABLET | ORAL | Status: DC

## 2013-03-04 MED ORDER — IBUPROFEN 800 MG PO TABS
800.0000 mg | ORAL_TABLET | Freq: Once | ORAL | Status: AC
  Administered 2013-03-04: 800 mg via ORAL

## 2013-03-04 MED ORDER — HYDROCODONE-ACETAMINOPHEN 5-325 MG PO TABS
ORAL_TABLET | ORAL | Status: AC
  Filled 2013-03-04: qty 1

## 2013-03-04 MED ORDER — IBUPROFEN 800 MG PO TABS
ORAL_TABLET | ORAL | Status: AC
  Filled 2013-03-04: qty 1

## 2013-03-04 NOTE — Progress Notes (Signed)
Orthopedic Tech Progress Note Patient Details:  Jessica Casey 24-Jul-1951 [id number]  Ortho Devices Type of Ortho Device: Ace wrap;Sugartong splint Ortho Device/Splint Location: LUE Ortho Device/Splint Interventions: Ordered;Application   Jennye Moccasin 03/04/2013, 9:32 PM

## 2013-03-04 NOTE — ED Provider Notes (Signed)
Chief Complaint:   Chief Complaint  Patient presents with  . Wrist Injury    History of Present Illness:   Jessica Casey is a 62 year old female who was at her job today at the [employer], when she slipped and fell backwards. She caught all her weight on her outstretched left wrist. Ever since then she's had pain in the wrist, swelling, deformity, and pain with movement. She is able to move all her digits well. She denies any swelling of the fingers. She's had no numbness or tingling in the hand. She denies any other injuries.  Review of Systems:  Other than noted above, the patient denies any of the following symptoms: Systemic:  No fevers, chills, sweats, or aches.  No fatigue or tiredness. Musculoskeletal:  No joint pain, arthritis, bursitis, swelling, back pain, or neck pain. Neurological:  No muscular weakness, paresthesias, headache, or trouble with speech or coordination.  No dizziness.  PMFSH:  Past medical history, family history, social history, meds, and allergies were reviewed.    Physical Exam:   Vital signs:  BP 144/64  Pulse 100  Temp(Src) 98.5 F (36.9 C) (Oral)  Resp 16  SpO2 98% Gen:  Alert and oriented times 3.  In no distress. Musculoskeletal: There is deformity, swelling, and pain to palpation of the distal radius, surprisingly the wrist has a full range of motion with minimal pain, and all the digits have full range of motion.  Otherwise, all joints had a full a ROM with no swelling, bruising or deformity.  No edema, pulses full. Extremities were warm and pink.  Capillary refill was brisk.  Skin:  Clear, warm and dry.  No rash. Neuro:  Alert and oriented times 3.  Muscle strength was normal.  Sensation was intact to light touch.   Radiology:  Dg Wrist Complete Left  03/04/2013   *RADIOLOGY REPORT*  Clinical Data: Fall.  Left wrist injury.  LEFT WRIST - COMPLETE 3+ VIEW  Comparison: None.  Findings: There is a transverse impacted distal radius fracture. There is  intra-articular extension of the radiocarpal joint along the ulnar aspect.  Volar displacement of the distal radius is 5 mm. Loss of the normal volar tilt.  Distal ulna appears intact.  There is widening of the distal radial ulnar joint.  Scaphoid bone appears within normal limits. Scapholunate interval intact.  IMPRESSION: Transverse impacted mildly displaced distal radius fracture with intra-articular extension to the radiocarpal joint.   Original Report Authenticated By: Andreas Newport, M.D.     I reviewed the images independently and personally and concur with the radiologist's findings.  Course in Urgent Care Center:   The patient had 4 rings on her left ring finger. There was concern that if there was swelling this would compromise the circulation. We attempted to remove first a lubricated finger the rings but were unable to thereafter we sought all 4 rings of and it rings back palpation. She was given Norco 5/325 and ibuprofen 800 mg for pain. She was placed in a sugar tong splint and she has her own sling she can with and should use this.  Assessment:  The encounter diagnosis was Radius fracture, left, closed, initial encounter.  She has a transverse fracture of the radius with impaction. Call the orthopedist on call who recommended sugar tong splinting and have her see an orthopedist next week. Since this was a workers comp injury, she'll need to go to the occupational medicine clinic first which he can do tomorrow, then be  referred to an orthopedist for followup.  Plan:   1.  The following meds were prescribed:   New Prescriptions   HYDROCODONE-ACETAMINOPHEN (NORCO/VICODIN) 5-325 MG PER TABLET    1 to 2 tabs every 4 to 6 hours as needed for pain.   2.  The patient was instructed in symptomatic care, including rest and activity, elevation, application of ice and compression.  Appropriate handouts were given. 3.  The patient was told to return if becoming worse in any way, if no better in 3  or 4 days, and given some red flag symptoms such as increasing pain or numbness in the hand that would indicate earlier return.   4.  The patient was told to follow up at occupational med clinic tomorrow then with an orthopedist.    Reuben Likes, MD 03/04/13 2130

## 2013-03-04 NOTE — ED Notes (Signed)
C/O falling onto outstreched left hand at approx 1700.  C/O continued left wrist pain.  CMS intact to all fingers.  4 rings removed removed from left 4th finger via ring cutter.

## 2013-03-04 NOTE — ED Notes (Signed)
Ortho tech notified of splint request. 

## 2013-03-30 ENCOUNTER — Other Ambulatory Visit: Payer: Self-pay | Admitting: Internal Medicine

## 2013-03-30 ENCOUNTER — Telehealth: Payer: Self-pay | Admitting: Internal Medicine

## 2013-03-30 DIAGNOSIS — M5010 Cervical disc disorder with radiculopathy, unspecified cervical region: Secondary | ICD-10-CM

## 2013-03-30 NOTE — Telephone Encounter (Signed)
Hopp please advise  

## 2013-03-30 NOTE — Telephone Encounter (Signed)
Patient called stating she would like a referral to Dr. Venetia Maxon for the numbness in her R arm that she dicussed w/Dr Alwyn Ren on 02/17/13.

## 2013-04-27 ENCOUNTER — Other Ambulatory Visit: Payer: Self-pay | Admitting: Neurosurgery

## 2013-04-30 ENCOUNTER — Encounter: Payer: Self-pay | Admitting: Internal Medicine

## 2013-05-05 ENCOUNTER — Other Ambulatory Visit: Payer: Self-pay | Admitting: Internal Medicine

## 2013-05-11 ENCOUNTER — Encounter (HOSPITAL_COMMUNITY): Payer: Self-pay | Admitting: Pharmacy Technician

## 2013-05-13 ENCOUNTER — Encounter (HOSPITAL_COMMUNITY): Payer: Self-pay

## 2013-05-13 ENCOUNTER — Encounter (HOSPITAL_COMMUNITY)
Admission: RE | Admit: 2013-05-13 | Discharge: 2013-05-13 | Disposition: A | Discharge status: Home / Self Care | Payer: BC Managed Care – PPO | Source: Ambulatory Visit | Attending: Neurosurgery | Admitting: Neurosurgery

## 2013-05-13 DIAGNOSIS — Z01818 Encounter for other preprocedural examination: Secondary | ICD-10-CM | POA: Insufficient documentation

## 2013-05-13 DIAGNOSIS — Z01812 Encounter for preprocedural laboratory examination: Secondary | ICD-10-CM | POA: Insufficient documentation

## 2013-05-13 HISTORY — DX: Infectious mononucleosis, unspecified without complication: B27.90

## 2013-05-13 HISTORY — DX: Personal history of other medical treatment: Z92.89

## 2013-05-13 HISTORY — DX: Cardiac murmur, unspecified: R01.1

## 2013-05-13 LAB — BASIC METABOLIC PANEL
CO2: 27 mEq/L (ref 19–32)
Calcium: 9.6 mg/dL (ref 8.4–10.5)
Chloride: 107 mEq/L (ref 96–112)
Creatinine, Ser: 0.81 mg/dL (ref 0.50–1.10)
Glucose, Bld: 96 mg/dL (ref 70–99)
Sodium: 142 mEq/L (ref 135–145)

## 2013-05-13 LAB — CBC
HCT: 36.9 % (ref 36.0–46.0)
MCH: 33.2 pg (ref 26.0–34.0)
MCV: 96.6 fL (ref 78.0–100.0)
Platelets: 238 10*3/uL (ref 150–400)
RBC: 3.82 MIL/uL — ABNORMAL LOW (ref 3.87–5.11)

## 2013-05-13 NOTE — Pre-Procedure Instructions (Signed)
Jessica Casey  05/13/2013   Your procedure is scheduled on:  05/20/2013  Report to [facility] at 1:15PM.  Call this number if you have problems the morning of surgery: [phone]   Remember:   Do not eat food or drink liquids after midnight.  ON Wednesday  Take these medicines the morning of surgery with A SIP OF WATER: Cymbalta, abilify, tylenol   Do not wear jewelry, make-up or nail polish.  Do not wear lotions, powders, or perfumes. You may wear deodorant.  Do not shave 48 hours prior to surgery. Men may shave face and neck.  Do not bring valuables to the hospital.  [facility] is not responsible                   for any belongings or valuables.  Contacts, dentures or bridgework may not be worn into surgery.  Leave suitcase in the car. After surgery it may be brought to your room.  For patients admitted to the hospital, checkout time is 11:00 AM the day of  discharge.   Patients discharged the day of surgery will not be allowed to drive  home.  Name and phone number of your driver: /w spouse   Special Instructions: Shower using CHG 2 nights before surgery and the night before surgery.  If you shower the day of surgery use CHG.  Use special wash - you have one bottle of CHG for all showers.  You should use approximately 1/3 of the bottle for each shower.   Please read over the following fact sheets that you were given: Pain Booklet, Coughing and Deep Breathing, MRSA Information and Surgical Site Infection Prevention

## 2013-05-13 NOTE — Progress Notes (Signed)
Pt. Followed by Dr. Alwyn Ren at [facility], pt. Had ECHO, prior to 2000, has a history of MVP. EKG with [facility] in 2012, no cardiac f/u since then.  Pt. Reports that she had surgery at Surgery center in June- 2014, for L wrist fx., No problems, anesth. Uneventful.

## 2013-05-14 ENCOUNTER — Ambulatory Visit (INDEPENDENT_AMBULATORY_CARE_PROVIDER_SITE_OTHER): Payer: BC Managed Care – PPO | Admitting: Obstetrics and Gynecology

## 2013-05-14 ENCOUNTER — Encounter: Payer: Self-pay | Admitting: Obstetrics and Gynecology

## 2013-05-14 VITALS — BP 100/64 | HR 80 | Ht 59.0 in | Wt 129.0 lb

## 2013-05-14 DIAGNOSIS — N390 Urinary tract infection, site not specified: Secondary | ICD-10-CM

## 2013-05-14 DIAGNOSIS — Z01419 Encounter for gynecological examination (general) (routine) without abnormal findings: Secondary | ICD-10-CM

## 2013-05-14 DIAGNOSIS — Z0000 Encounter for general adult medical examination without abnormal findings: Secondary | ICD-10-CM

## 2013-05-14 DIAGNOSIS — M810 Age-related osteoporosis without current pathological fracture: Secondary | ICD-10-CM

## 2013-05-14 LAB — POCT URINALYSIS DIPSTICK
Bilirubin, UA: NEGATIVE
Ketones, UA: NEGATIVE
Protein, UA: NEGATIVE

## 2013-05-14 MED ORDER — ALENDRONATE SODIUM 70 MG PO TABS: 70.0000 mg | ORAL_TABLET | ORAL | Status: DC

## 2013-05-14 NOTE — Patient Instructions (Addendum)
EXERCISE AND DIET:  We recommended that you start or continue a regular exercise program for good health. Regular exercise means any activity that makes your heart beat faster and makes you sweat.  We recommend exercising at least 30 minutes per day at least 3 days a week, preferably 4 or 5.  We also recommend a diet low in fat and sugar.  Inactivity, poor dietary choices and obesity can cause diabetes, heart attack, stroke, and kidney damage, among others.    ALCOHOL AND SMOKING:  Women should limit their alcohol intake to no more than 7 drinks/beers/glasses of wine (combined, not each!) per week. Moderation of alcohol intake to this level decreases your risk of breast cancer and liver damage. And of course, no recreational drugs are part of a healthy lifestyle.  And absolutely no smoking or even second hand smoke. Most people know smoking can cause heart and lung diseases, but did you know it also contributes to weakening of your bones? Aging of your skin?  Yellowing of your teeth and nails?  CALCIUM AND VITAMIN D:  Adequate intake of calcium and Vitamin D are recommended.  The recommendations for exact amounts of these supplements seem to change often, but generally speaking 600 mg of calcium (either carbonate or citrate) and 800 units of Vitamin D per day seems prudent. Certain women may benefit from higher intake of Vitamin D.  If you are among these women, your doctor will have told you during your visit.    PAP SMEARS:  Pap smears, to check for cervical cancer or precancers,  have traditionally been done yearly, although recent scientific advances have shown that most women can have pap smears less often.  However, every woman still should have a physical exam from her gynecologist every year. It will include a breast check, inspection of the vulva and vagina to check for abnormal growths or skin changes, a visual exam of the cervix, and then an exam to evaluate the size and shape of the uterus and  ovaries.  And after 63 years of age, a rectal exam is indicated to check for rectal cancers. We will also provide age appropriate advice regarding health maintenance, like when you should have certain vaccines, screening for sexually transmitted diseases, bone density testing, colonoscopy, mammograms, etc.   MAMMOGRAMS:  All women over 40 years old should have a yearly mammogram. Many facilities now offer a "3D" mammogram, which may cost around $50 extra out of pocket. If possible,  we recommend you accept the option to have the 3D mammogram performed.  It both reduces the number of women who will be called back for extra views which then turn out to be normal, and it is better than the routine mammogram at detecting truly abnormal areas.    COLONOSCOPY:  Colonoscopy to screen for colon cancer is recommended for all women at age 50.  We know, you hate the idea of the prep.  We agree, BUT, having colon cancer and not knowing it is worse!!  Colon cancer so often starts as a polyp that can be seen and removed at colonscopy, which can quite literally save your life!  And if your first colonoscopy is normal and you have no family history of colon cancer, most women don't have to have it again for 10 years.  Once every ten years, you can do something that may end up saving your life, right?  We will be happy to help you get it scheduled when you are ready.    Be sure to check your insurance coverage so you understand how much it will cost.  It may be covered as a preventative service at no cost, but you should check your particular policy.    Osteoporosis Throughout your life, your body breaks down old bone and replaces it with new bone. As you get older, your body does not replace bone as quickly as it breaks it down. By the age of 30 years, most people begin to gradually lose bone because of the imbalance between bone loss and replacement. Some people lose more bone than others. Bone loss beyond a specified normal  degree is considered osteoporosis.  Osteoporosis affects the strength and durability of your bones. The inside of the ends of your bones and your flat bones, like the bones of your pelvis, look like honeycomb, filled with tiny open spaces. As bone loss occurs, your bones become less dense. This means that the open spaces inside your bones become bigger and the walls between these spaces become thinner. This makes your bones weaker. Bones of a person with osteoporosis can become so weak that they can break (fracture) during minor accidents, such as a simple fall. CAUSES  The following factors have been associated with the development of osteoporosis:  Smoking.  Drinking more than 2 alcoholic drinks several days per week.  Long-term use of certain medicines:  Corticosteroids.  Chemotherapy medicines.  Thyroid medicines.  Antiepileptic medicines.  Gonadal hormone suppression medicine.  Immunosuppression medicine.  Being underweight.  Lack of physical activity.  Lack of exposure to the sun. This can lead to vitamin D deficiency.  Certain medical conditions:  Certain inflammatory bowel diseases, such as Crohn's disease and ulcerative colitis.  Diabetes.  Hyperthyroidism.  Hyperparathyroidism. RISK FACTORS Anyone can develop osteoporosis. However, the following factors can increase your risk of developing osteoporosis:  Gender Women are at higher risk than men.  Age Being older than 50 years increases your risk.  Ethnicity White and Asian people have an increased risk.  Weight Being extremely underweight can increase your risk of osteoporosis.  Family history of osteoporosis Having a family member who has developed osteoporosis can increase your risk. SYMPTOMS  Usually, people with osteoporosis have no symptoms.  DIAGNOSIS  Signs during a physical exam that may prompt your caregiver to suspect osteoporosis include:  Decreased height. This is usually caused by the  compression of the bones that form your spine (vertebrae) because they have weakened and become fractured.  A curving or rounding of the upper back (kyphosis). To confirm signs of osteoporosis, your caregiver may request a procedure that uses 2 low-dose X-ray beams with different levels of energy to measure your bone mineral density (dual-energy X-ray absorptiometry [DXA]). Also, your caregiver may check your level of vitamin D. TREATMENT  The goal of osteoporosis treatment is to strengthen bones in order to decrease the risk of bone fractures. There are different types of medicines available to help achieve this goal. Some of these medicines work by slowing the processes of bone loss. Some medicines work by increasing bone density. Treatment also involves making sure that your levels of calcium and vitamin D are adequate. PREVENTION  There are things you can do to help prevent osteoporosis. Adequate intake of calcium and vitamin D can help you achieve optimal bone mineral density. Regular exercise can also help, especially resistance and weight-bearing activities. If you smoke, quitting smoking is an important part of osteoporosis prevention. MAKE SURE YOU:  Understand these instructions.  Will watch   your condition.  Will get help right away if you are not doing well or get worse. Document Released: 06/26/2005 Document Revised: 09/02/2012 Document Reviewed: 08/31/2011 [employer] Patient Information 2014 [employer].  

## 2013-05-14 NOTE — Progress Notes (Signed)
Patient ID: Jessica Casey, female   DOB: 16-Aug-1951, 62 y.o.   MRN: [id number] 62 y.o.   Married    Caucasian   female   G2P2002   here for annual exam.    Patient has a history of Evista (10 years) and tamoxifen (4 years) use for breast cancer prevention.   Family history of breast cancer in patient's sister and mother - both were estrogen receptor positive.  Sister tested negative for BRCAs.   On Fosamax for osteoporosis treatment.  Due for bone density at [facility].  Last study T score - 2.5 per patient.   Taking Cymbalta and Abilify for depression - Dr. Alwyn Ren turning her over to a psychiatrist.     No LMP recorded. Patient is postmenopausal.          Sexually active: yes  The current method of family planning is post menopausal status.    Exercising: no Last mammogram:  03/2013 NWG:[id number] Last pap smear: 2012 wnl History of abnormal pap: no Smoking: no Alcohol: no Last colonoscopy: 05/2012 with Dr. Arlyce Dice at [facility]:hx colon polyps.  Next colonoscopy due 05/2017. Last Bone Density: 2012 borderline osteoporosis:[facility]  Last tetanus shot: within past 5 years. Last cholesterol check: 01/2013 wnl  Urine: 1+WBC's(asymptomatic)   Family History  Problem Relation Age of Onset  . Diabetes Father   . Coronary artery disease Father     S/P CBAG , no MI  . Depression Father   . Diverticulosis Father   . Breast cancer Mother   . Stroke Mother 73  . Breast cancer Sister   . Depression Sister   . Depression Sister   . Depression Sister   . Depression Sister   . Depression Brother   . Bipolar disorder Other     Neice  . Colon polyps Sister   . Colon cancer Neg Hx     Patient Active Problem List   Diagnosis Date Noted  . Anemia 02/17/2013  . Hypoalbuminemia 02/17/2013  . Unspecified vitamin D deficiency 02/17/2013  . DEPRESSION 08/02/2009  . Osteoporosis, unspecified 08/02/2009  . FATIGUE 08/02/2009  . COLONIC POLYPS, HX OF 08/02/2009  . DEGENERATIVE DISC DISEASE 10/27/2008   . CERVICAL RADICULOPATHY, LEFT 10/27/2008    Past Medical History  Diagnosis Date  . DDD (degenerative disc disease)   . Depression   . Osteoporosis   . Colonic polyp 2005 & 2013  . Heart murmur     MVP- echo- 2010, no symptoms   . H/O echocardiogram     before 2000, no need for f/u /w cardiac   . Mononucleosis 1971  . Pinched nerve in neck     Past Surgical History  Procedure Laterality Date  . Umbilical hernia repair    . Cervical fusion      2 proceduresr Venetia Maxon  . Lumbar disc surgery      Dr.Stern  . Rotator cuff repair      Dr.Wainer  . Colonoscopy with polypectomy  2013    Dr Arlyce Dice  . Knee surgery      Dr Meade Maw ; bursa cystectomy  . Carpal tunnel release Right   . Cataract extraction, bilateral  2013    Dr Nile Riggs, Lacretia Nicks IOL  . Hernia repair    . Fracture surgery      L wrist - June 2014  . Wrist fracture surgery Left 02/2013    Dr. Mckinley Jewel    Allergies: Gabapentin  Current Outpatient Prescriptions  Medication Sig Dispense Refill  .  acetaminophen (TYLENOL) 500 MG tablet Take 1,000 mg by mouth daily as needed for pain.      Marland Kitchen alendronate (FOSAMAX) 70 MG tablet Take 70 mg by mouth every Sunday. Take with a full glass of water on an empty stomach.      . ARIPiprazole (ABILIFY) 5 MG tablet Take 5 mg by mouth every morning.      . DULoxetine (CYMBALTA) 60 MG capsule Take 60 mg by mouth daily before breakfast.       . aspirin 81 MG tablet Take 81 mg by mouth daily.        . Biotin 300 MCG TABS Take 300 mcg by mouth daily.        . calcium carbonate (OS-CAL) 600 MG TABS Take 600 mg by mouth daily.        Marland Kitchen HYDROcodone-acetaminophen (NORCO/VICODIN) 5-325 MG per tablet Take 1 tablet by mouth every 6 (six) hours as needed for pain.       No current facility-administered medications for this visit.    ROS: Pertinent items are noted in HPI.  Social Hx:  Married.  Teacher.  Exam:    BP 100/64  Pulse 80  Ht 4\' 11"  (1.499 m)  Wt 129 lb (58.514 kg)  BMI  26.04 kg/m2   Wt Readings from Last 3 Encounters:  05/14/13 129 lb (58.514 kg)  05/13/13 126 lb 8 oz (57.38 kg)  02/17/13 131 lb (59.421 kg)     Ht Readings from Last 3 Encounters:  05/14/13 4\' 11"  (1.499 m)  05/13/13 5' (1.524 m)  06/25/12 5' (1.524 m)    General appearance: alert, cooperative and appears stated age Head: Normocephalic, without obvious abnormality, atraumatic Neck: no adenopathy, supple, symmetrical, trachea midline and thyroid not enlarged, symmetric, no tenderness/mass/nodules Lungs: clear to auscultation bilaterally Breasts: Inspection negative, No nipple retraction or dimpling, No nipple discharge or bleeding, No axillary or supraclavicular adenopathy, Normal to palpation without dominant masses Heart: regular rate and rhythm Abdomen: soft, non-tender; no masses,  no organomegaly Extremities: extremities normal, atraumatic, no cyanosis or edema Skin: Skin color, texture, turgor normal. No rashes or lesions Lymph nodes: Cervical, supraclavicular, and axillary nodes normal. No abnormal inguinal nodes palpated Neurologic: Grossly normal   Pelvic: External genitalia:  no lesions              Urethra:  normal appearing urethra with no masses, tenderness or lesions              Bartholins and Skenes: normal                 Vagina: normal appearing vagina with normal color and discharge, no lesions              Cervix: normal appearance              Pap taken: yes and high risk HPV        Bimanual Exam:  Uterus:  uterus is normal size, shape, consistency and nontender                                      Adnexa: normal adnexa in size, nontender and no masses                                      Rectovaginal: Confirms  Anus:  normal sphincter tone, no lesions  Assessment  Normal menopausal exam Osteoporosis Family history of breast cancer Possible UTI    P:     Mammogram yearly pap smear and high risk HPV testing Urine  culture Bone density at [facility]  Order placed in Epic for outside bone density.  Patient will call for appointment.  Refill Fosamax counseled on osteoporosis, adequate intake of calcium and vitamin D Rx for Zostavax vaccine to take to the pharmacy. return annually or prn     An After Visit Summary was printed and given to the patient.

## 2013-05-16 LAB — URINE CULTURE: Colony Count: 60000

## 2013-05-18 LAB — IPS PAP TEST WITH HPV

## 2013-05-19 MED ORDER — CEFAZOLIN SODIUM-DEXTROSE 2-3 GM-% IV SOLR
2.0000 g | INTRAVENOUS | Status: AC
  Administered 2013-05-20: 2 g via INTRAVENOUS
  Filled 2013-05-19: qty 50

## 2013-05-19 NOTE — H&P (Signed)
OUTPATIENT OFFICE NOTE   HOPI:                                                   Jessica Casey returns today.  She has developed significant right arm weakness.  She notes that this occurred after she fell while at work in [employer] and initially had to focus on the fact that she had broken her left wrist and had to have surgery for her left wrist, but she comes in today with significant right arm weakness and a lot of neck pain.  I told her that I thought that the injury that she sustained could certainly be related to the fall, but that she now has a significant problem for which she needs to have some treatment.  She has developed a disc herniation and spondylitic spurring on the right at C7-T1 and T1-2 level.  She also has some disc protrusion at T2-3 level, but this I do not believe is the significant problem for her.   DATA:                                                  I have reviewed her MRI and also sent her for radiographs, which showed that she has spondylosis and I believe that this is accessible from the anterior approach.  She has marked hand intrinsic weakness including numbness in her fourth and fifth digits and right parascapular pain with positive right Spurling's maneuver.  Additionally, she is complaining of low back pain and they evaluated that, and I believe that she has had progressive degeneration at the L2-3 level with what appears to be solid arthrodesis at the previously operated L3-4 and L4-5 levels.   IMPRESSION/PLAN:                             In light of Jessica Casey's significant weakness, I have recommended that she needs to go ahead with surgery for this problem and this would consist of anterior cervical decompression and fusion at the C7-T1 and T1-2 levels.  We will plan on doing this on 05/13/2013.       _________________________________ Danae Orleans. Venetia Maxon, MD [facility]                              #[id number]  DOB:  03/24/1952 09/07/2012:                                                                           Jessica Casey returns today.  She does have spondylosis and some scoliosis right greater than left at the L2-3 level which is causing some foraminal stenosis and far lateral view does show some osteophytic spurring and disc protrusion which may be causing some right-sided L2 nerve root compression in the foramen.  She is still having  quite a bit of back discomfort.   I told her that I did not think surgery was necessary.  Her scan is not that bad.  I do think that she may benefit from some physical therapy and I will set her up for a course of physical therapy.  If she does not get relief with that, we will do injections.  I will see how she does.  She will followup with me on an as needed basis.                                                                                                        Danae Orleans. Venetia Maxon, M.D./gde [facility] [place]                              #[id number]  DOB:  03/24/1952 08/24/2012:                                                                        Jessica Casey comes in today.  She underwent exploration of L3-4 fusion with posterior lumbar interbody fusion at L4-5 on 06/28/2011.  She has been doing well up until now and comes in today with what she describes as "knife-like" back pain which has increased over the last few months and now has pain running to the right leg.  She is wondering about the duration of this problem as well as what her future holds.   I examined her.  She actually has good strength in her legs to confrontational testing.  She does not appear to have hip flexor weakness on the right.  She has a markedly positive seated straight leg raise and also some right sciatic notch discomfort to palpation.   Her radiographs obtained in the office today demonstrate a solid fusion at L3-4 and L4-5 levels but has developed scoliosis and degeneration most marked on the right side at the L2-3 level which I believe is the basis for  her new pain.   I recommended that we get a new MRI of the lumbar spine and I will make further recommendations at that point.  She appears to have a solid arthrodesis on previously operated L3-4 and L4-5 levels.  Danae Orleans. Venetia Maxon, M.D./gde [facility] [place]  #[id number]  DOB:  1951-02-26 11/16/2008:                          Jessica Casey returns today.  I have not seen her since 2007 when she came back with some mild back discomfort.  I had previously done an anterior cervical fusion in October of 1999 and she did well with that.  She has now had about a 4-week history of progressively more disabling left arm pain into the upper arm and went to see Dr. Alwyn Ren who obtained an MRI of the cervical spine and we obtained radiographs of her cervical spine.  This shows that she has what appears to be a solid arthrodesis at previously operated C5 through C7 levels.  She has a retrolisthesis of C4 on C5 with left greater than right foraminal stenosis at C4-5.  She also appears to have a T1-2 disc herniation on the right with effacement of the subarachnoid space, though without compression of the spinal cord or nerve root.    On examination she has normal examination with the exception of positive left Spurling maneuver and left C5 weakness at 4/5 in her deltoid.  She also has numbness in her left C5 distribution.    I have reviewed Jessica Casey's situation with her and her radiographs and MRI and I do believe that this is going to need to be treated surgically.  She says she is hurting more with this than she has with either of her two previous spinal problems.  I have recommended that she undergo exploration of fusion from C5 through C7 with removal of previously placed hardware with anterior cervical decompression and fusion at the C4-5 level.  I went over the diagnostic studies in detail and reviewed surgical models and also  discussed the exact nature of the surgical procedure, attendant risks, potential benefits and typical operative and postoperative course.  I discussed the risks of surgery which include, but are not limited to, risks of anesthesia, blood loss, infection, injury to various neck structures including the trachea, esophagus, which could cause temporary or permanent swallowing difficulties and also the potential for perforation of the esophagus which might require operative intervention, larynx, recurrent laryngeal nerve, which could cause either temporary or permanent vocal cord paralysis resulting in either temporary or permanent voice changes, injury to cervical nerve roots, which could cause either temporary or permanent arm pain, numbness and/or weakness.  There is a small chance of injury to the spinal cord which could cause paralysis.  There is also the potential for malplacement of instrumentation, fusion failure, need for repeat surgery, degenerative disease at other levels in the neck, failure to relieve the pain, worsening of pain.  I also discussed with the patient that she will lose some neck mobility from the surgery.  It is typical to stay in the hospital overnight after this operation.  Typically she will not be able to drive for 2 weeks after surgery and will come back to see me 2 weeks following surgery with a lateral C-spine x-ray and then for monthly visits to 3 months after surgery.  Generally patients are out of work for 4 to 6 weeks following surgery.  She will wear a soft collar for two weeks after surgery.    Her disc herniation at what appears to be the T1-2 level which is different from the radiologist's interpretation which was not correct in terms of  levels is not I believe symptomatic and does not need to be addressed at this time.  She will be fitted for a soft cervical collar.  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   She also says that she broke out with Neurontin and is allergic to Neurontin.  One week after she took that medication she developed a rash with severe itching.  I gave her a prescription for hydrocodone 5/500 one every 4 hours as needed for pain, dispensed #40.                                                                                                    Danae Orleans. Venetia Maxon, M.D./sv   NEUROSURGICAL CONSULTATION     [facility] [place]                               DOB: 12-30-2050                     #[id number]                    October 28, 2005   HISTORY:                                                                             Jessica Casey is a patient on whom I previously did surgery in 1999 and this consisted of an anterior cervical decompression and fusion at C5-6 and C6-7 levels. She has done well from that without significant neck discomfort or arm pain and now comes in complaining of pain in her left leg. She says it has been going on for greater than a year and has been progressively getting worse.  She notes numbness and tingling into her left leg and also left leg weakness. She says she has problems going up and down steps and also vacuuming.  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 She currently describes her pain level as 4 out of 10 in severity, but it gets up to about 8 out of 10 at night.  She denies any other significant problems. She has had problems with depression for which she takes Prozac.   REVIEW OF SYSTEMS:                                     A detailed Review of Systems sheet was reviewed with the patient.  Pertinent positives include under eyes -  wears contacts; under cardiovascular - heart murmur, mitral valve prolapse, leg pain while walking; under musculoskeletal - leg weakness, back pain, leg pain, arthritis; under psychiatric -  depression.  All other systems are negative; this includes Constitutional symptoms, Ears, nose, mouth, throat, Endocrine, Respiratory, Gastrointestinal, Genitourinary, Integumentary & Breast, Neurologic, Hematologic/Lymphatic, Allergic/Immunologic.    PAST MEDICAL HISTORY:                                         Prior Operations and Hospitalizations:            Umbilical hernia in 1952, childbirth in 1977 and 1978, tooth extraction in 1981, arthroscopic knee surgery in 1985, cervical fusion in 1999, and carpal tunnel surgery in 2002.             Medications and Allergies:                   Current medications include Fluoxetine HCL 40 mg., Evista 60 mg., Clindagel 1% gel, Vitamin E 400 IU, antioxidant, Ginkgo Biloba 120 mg., Children's Bayer Aspirin 81 mg., Peridin-C, Stress Tabs with iron, and Viactin 500+D+K.             Height and Weight:                                                 She is 5'0" tall, 125 lbs.    SOCIAL HISTORY:                                                              She is a nonsmoker, nondrinker, no history of substance abuse.   DIAGNOSTIC STUDIES:                                   She had an MRI of her lumbar spine which demonstrates mild disc bulging at the L4-5 level, but with a significant right greater than left-sided bilobed disc herniation at L3-4 causing significant right L3 and L4 nerve root compression as well as lateral recess stenosis on the left at the L3-4 level.  She has anterolisthesis of L3 on L4 with posterior element hypertrophy at this level which is resulting in significant bilateral lateral recess stenosis and central stenosis as well at this level.    She has not had any previous plain radiographs of her lumbar spine and I have asked her to obtain these in the near future to assess whether there is evidence of mobile spondylolisthesis.    PHYSICAL EXAMINATION:                                       General Appearance:  On examination today, Jessica Casey is a pleasant, cooperative woman in no acute distress.             Musculoskeletal Examination -   She is able to walk about the examining room with a slightly antalgic gait favoring her left lower extremity. She has left sciatic notch discomfort to palpation and low back pain to palpation.  She is able to bend to touch her toes.  She is able to squat easily on her right leg independently, but is not able to do so on her left.    NEUROLOGICAL EXAMINATION:               The patient is oriented to time, person and place and has good recall of both recent and remote memory with normal attention span and concentration.  The patient speaks with clear and fluent speech and exhibits normal language function and appropriate fund of knowledge.             Cranial Nerve Examination - pupils are equal, round and reactive to light.  Extraocular movements are full.  Visual fields are full to confrontational testing.  Facial sensation and facial movement are symmetric and intact.  Hearing is intact to finger rub.  Palate is upgoing.  Shoulder shrug is symmetric.  Tongue protrudes in the midline.             Motor Examination - She has mild weakness in her left quadriceps to confrontational testing.  Otherwise full strength in all motor groups, bilaterally symmetric in the upper and lower extremities.             Sensory Examination - she does not note significant numbness in her upper or lower extremities to pinprick.             Deep Tendon Reflexes - 2 in the biceps, triceps, and brachioradialis, 3 in the knees, 2 in the ankles.  The great toes are downgoing to plantar stimulation.            Cerebellar Examination - normal coordination in upper and lower extremities and normal rapid alternating movements.  Romberg test is negative.    IMPRESSION AND RECOMMENDATIONS:             Jessica Casey is a 62 year old woman with left leg pain and low back pain. She  has a significant disc herniation at the L3-4 level with the anterolisthesis of L3 on L4 and with loss of disc height at this level. I believe this is the basis for her current pain complaints. I have recommended that we obtain plain radiographs to assess whether there is evidence of mobile spondylolisthesis. I plan on seeing her back after these have been performed and make further recommendations at that point.    [facility]       Danae Orleans. Venetia Maxon, M.D.

## 2013-05-20 ENCOUNTER — Encounter (HOSPITAL_COMMUNITY): Payer: Self-pay | Admitting: *Deleted

## 2013-05-20 ENCOUNTER — Encounter (HOSPITAL_COMMUNITY)
Admission: RE | Disposition: A | Discharge status: Home / Self Care | Payer: Self-pay | Source: Ambulatory Visit | Attending: Neurosurgery

## 2013-05-20 ENCOUNTER — Ambulatory Visit (HOSPITAL_COMMUNITY): Payer: BC Managed Care – PPO | Admitting: Anesthesiology

## 2013-05-20 ENCOUNTER — Encounter (HOSPITAL_COMMUNITY): Payer: Self-pay | Admitting: Anesthesiology

## 2013-05-20 ENCOUNTER — Ambulatory Visit (HOSPITAL_COMMUNITY): Payer: BC Managed Care – PPO

## 2013-05-20 ENCOUNTER — Observation Stay (HOSPITAL_COMMUNITY)
Admission: RE | Admit: 2013-05-20 | Discharge: 2013-05-21 | Disposition: A | Discharge status: Home / Self Care | Payer: BC Managed Care – PPO | Source: Ambulatory Visit | Attending: Neurosurgery | Admitting: Neurosurgery

## 2013-05-20 DIAGNOSIS — M5124 Other intervertebral disc displacement, thoracic region: Secondary | ICD-10-CM | POA: Insufficient documentation

## 2013-05-20 DIAGNOSIS — M5020 Other cervical disc displacement, unspecified cervical region: Principal | ICD-10-CM | POA: Insufficient documentation

## 2013-05-20 DIAGNOSIS — M47812 Spondylosis without myelopathy or radiculopathy, cervical region: Secondary | ICD-10-CM | POA: Insufficient documentation

## 2013-05-20 HISTORY — PX: ANTERIOR CERVICAL DECOMP/DISCECTOMY FUSION: SHX1161

## 2013-05-20 SURGERY — ANTERIOR CERVICAL DECOMPRESSION/DISCECTOMY FUSION 2 LEVELS
Anesthesia: General | Wound class: Clean

## 2013-05-20 MED ORDER — MORPHINE SULFATE 2 MG/ML IJ SOLN: 1.0000 mg | INTRAMUSCULAR | Status: DC | PRN

## 2013-05-20 MED ORDER — FLEET ENEMA 7-19 GM/118ML RE ENEM
1.0000 | ENEMA | Freq: Once | RECTAL | Status: AC | PRN
  Filled 2013-05-20: qty 1

## 2013-05-20 MED ORDER — GLYCOPYRROLATE 0.2 MG/ML IJ SOLN
INTRAMUSCULAR | Status: DC | PRN
  Administered 2013-05-20: 0.4 mg via INTRAVENOUS

## 2013-05-20 MED ORDER — DULOXETINE HCL 60 MG PO CPEP
60.0000 mg | ORAL_CAPSULE | Freq: Every day | ORAL | Status: DC
  Administered 2013-05-21: 60 mg via ORAL
  Filled 2013-05-20 (×2): qty 1

## 2013-05-20 MED ORDER — HYDROCODONE-ACETAMINOPHEN 5-325 MG PO TABS: 1.0000 | ORAL_TABLET | Freq: Four times a day (QID) | ORAL | Status: DC | PRN

## 2013-05-20 MED ORDER — CEFAZOLIN SODIUM 1-5 GM-% IV SOLN
1.0000 g | Freq: Three times a day (TID) | INTRAVENOUS | Status: AC
  Administered 2013-05-20 – 2013-05-21 (×2): 1 g via INTRAVENOUS
  Filled 2013-05-20 (×2): qty 50

## 2013-05-20 MED ORDER — OXYCODONE-ACETAMINOPHEN 5-325 MG PO TABS
1.0000 | ORAL_TABLET | ORAL | Status: DC | PRN
  Administered 2013-05-20 – 2013-05-21 (×2): 2 via ORAL
  Filled 2013-05-20 (×2): qty 2

## 2013-05-20 MED ORDER — ALUM & MAG HYDROXIDE-SIMETH 200-200-20 MG/5ML PO SUSP: 30.0000 mL | Freq: Four times a day (QID) | ORAL | Status: DC | PRN

## 2013-05-20 MED ORDER — LIDOCAINE HCL (CARDIAC) 20 MG/ML IV SOLN
INTRAVENOUS | Status: DC | PRN
  Administered 2013-05-20: 40 mg via INTRAVENOUS

## 2013-05-20 MED ORDER — BIOTIN 300 MCG PO TABS: 300.0000 ug | ORAL_TABLET | Freq: Every day | ORAL | Status: DC

## 2013-05-20 MED ORDER — OXYCODONE HCL 5 MG/5ML PO SOLN: 5.0000 mg | Freq: Once | ORAL | Status: DC | PRN

## 2013-05-20 MED ORDER — KCL IN DEXTROSE-NACL 20-5-0.45 MEQ/L-%-% IV SOLN
INTRAVENOUS | Status: DC
  Administered 2013-05-20: 23:00:00 via INTRAVENOUS
  Filled 2013-05-20 (×2): qty 1000

## 2013-05-20 MED ORDER — ARIPIPRAZOLE 5 MG PO TABS
5.0000 mg | ORAL_TABLET | ORAL | Status: DC
  Administered 2013-05-21: 5 mg via ORAL
  Filled 2013-05-20 (×2): qty 1

## 2013-05-20 MED ORDER — FENTANYL CITRATE 0.05 MG/ML IJ SOLN
INTRAMUSCULAR | Status: DC | PRN
  Administered 2013-05-20: 250 ug via INTRAVENOUS

## 2013-05-20 MED ORDER — DIAZEPAM 5 MG PO TABS: 5.0000 mg | ORAL_TABLET | Freq: Four times a day (QID) | ORAL | Status: DC | PRN

## 2013-05-20 MED ORDER — MIDAZOLAM HCL 5 MG/5ML IJ SOLN
INTRAMUSCULAR | Status: DC | PRN
  Administered 2013-05-20: 2 mg via INTRAVENOUS

## 2013-05-20 MED ORDER — BISACODYL 10 MG RE SUPP: 10.0000 mg | Freq: Every day | RECTAL | Status: DC | PRN

## 2013-05-20 MED ORDER — SENNA 8.6 MG PO TABS
1.0000 | ORAL_TABLET | Freq: Two times a day (BID) | ORAL | Status: DC
  Administered 2013-05-20: 8.6 mg via ORAL
  Filled 2013-05-20 (×2): qty 1

## 2013-05-20 MED ORDER — ACETAMINOPHEN 650 MG RE SUPP: 650.0000 mg | RECTAL | Status: DC | PRN

## 2013-05-20 MED ORDER — ONDANSETRON HCL 4 MG/2ML IJ SOLN: 4.0000 mg | INTRAMUSCULAR | Status: DC | PRN

## 2013-05-20 MED ORDER — ALENDRONATE SODIUM 70 MG PO TABS: 70.0000 mg | ORAL_TABLET | ORAL | Status: DC

## 2013-05-20 MED ORDER — ONDANSETRON HCL 4 MG/2ML IJ SOLN
INTRAMUSCULAR | Status: DC | PRN
  Administered 2013-05-20: 4 mg via INTRAVENOUS

## 2013-05-20 MED ORDER — LACTATED RINGERS IV SOLN
INTRAVENOUS | Status: DC | PRN
  Administered 2013-05-20 (×3): via INTRAVENOUS

## 2013-05-20 MED ORDER — SODIUM CHLORIDE 0.9 % IJ SOLN: 3.0000 mL | INTRAMUSCULAR | Status: DC | PRN

## 2013-05-20 MED ORDER — HYDROCODONE-ACETAMINOPHEN 5-325 MG PO TABS: 1.0000 | ORAL_TABLET | ORAL | Status: DC | PRN

## 2013-05-20 MED ORDER — PHENYLEPHRINE HCL 10 MG/ML IJ SOLN
10.0000 mg | INTRAVENOUS | Status: DC | PRN
  Administered 2013-05-20: 25 ug/min via INTRAVENOUS

## 2013-05-20 MED ORDER — THROMBIN 5000 UNITS EX SOLR
CUTANEOUS | Status: DC | PRN
  Administered 2013-05-20 (×2): 5000 [IU] via TOPICAL

## 2013-05-20 MED ORDER — MIDAZOLAM HCL 2 MG/2ML IJ SOLN: 0.5000 mg | Freq: Once | INTRAMUSCULAR | Status: DC | PRN

## 2013-05-20 MED ORDER — OXYCODONE HCL 5 MG PO TABS: 5.0000 mg | ORAL_TABLET | Freq: Once | ORAL | Status: DC | PRN

## 2013-05-20 MED ORDER — ROCURONIUM BROMIDE 100 MG/10ML IV SOLN
INTRAVENOUS | Status: DC | PRN
  Administered 2013-05-20: 50 mg via INTRAVENOUS

## 2013-05-20 MED ORDER — ACETAMINOPHEN 500 MG PO TABS: 1000.0000 mg | ORAL_TABLET | Freq: Every day | ORAL | Status: DC | PRN

## 2013-05-20 MED ORDER — ACETAMINOPHEN 325 MG PO TABS: 650.0000 mg | ORAL_TABLET | ORAL | Status: DC | PRN

## 2013-05-20 MED ORDER — LIDOCAINE-EPINEPHRINE 1 %-1:100000 IJ SOLN
INTRAMUSCULAR | Status: DC | PRN
  Administered 2013-05-20: 3 mL via INTRADERMAL

## 2013-05-20 MED ORDER — PANTOPRAZOLE SODIUM 40 MG IV SOLR
40.0000 mg | Freq: Every day | INTRAVENOUS | Status: DC
  Administered 2013-05-20: 40 mg via INTRAVENOUS
  Filled 2013-05-20 (×2): qty 40

## 2013-05-20 MED ORDER — HEMOSTATIC AGENTS (NO CHARGE) OPTIME
TOPICAL | Status: DC | PRN
  Administered 2013-05-20: 1 via TOPICAL

## 2013-05-20 MED ORDER — ARTIFICIAL TEARS OP OINT
TOPICAL_OINTMENT | OPHTHALMIC | Status: DC | PRN
  Administered 2013-05-20: 1 via OPHTHALMIC

## 2013-05-20 MED ORDER — DEXTROSE 5 % IV SOLN
INTRAVENOUS | Status: DC | PRN
  Administered 2013-05-20: 17:00:00 via INTRAVENOUS

## 2013-05-20 MED ORDER — BUPIVACAINE HCL (PF) 0.5 % IJ SOLN
INTRAMUSCULAR | Status: DC | PRN
  Administered 2013-05-20: 3 mL

## 2013-05-20 MED ORDER — NEOSTIGMINE METHYLSULFATE 1 MG/ML IJ SOLN
INTRAMUSCULAR | Status: DC | PRN
  Administered 2013-05-20: 3 mg via INTRAVENOUS

## 2013-05-20 MED ORDER — CALCIUM CARBONATE 600 MG PO TABS
600.0000 mg | ORAL_TABLET | Freq: Every day | ORAL | Status: DC
  Filled 2013-05-20: qty 1

## 2013-05-20 MED ORDER — ZOLPIDEM TARTRATE 5 MG PO TABS: 5.0000 mg | ORAL_TABLET | Freq: Every evening | ORAL | Status: DC | PRN

## 2013-05-20 MED ORDER — HYDROMORPHONE HCL PF 1 MG/ML IJ SOLN
0.2500 mg | INTRAMUSCULAR | Status: DC | PRN
  Administered 2013-05-20 (×4): .25 mg via INTRAVENOUS

## 2013-05-20 MED ORDER — PHENOL 1.4 % MT LIQD: 1.0000 | OROMUCOSAL | Status: DC | PRN

## 2013-05-20 MED ORDER — DOCUSATE SODIUM 100 MG PO CAPS
100.0000 mg | ORAL_CAPSULE | Freq: Two times a day (BID) | ORAL | Status: DC
  Administered 2013-05-20: 100 mg via ORAL
  Filled 2013-05-20: qty 1

## 2013-05-20 MED ORDER — CALCIUM CARBONATE 1250 (500 CA) MG PO TABS
1.0000 | ORAL_TABLET | Freq: Every day | ORAL | Status: DC
  Administered 2013-05-21: 500 mg via ORAL
  Filled 2013-05-20 (×2): qty 1

## 2013-05-20 MED ORDER — MEPERIDINE HCL 25 MG/ML IJ SOLN: 6.2500 mg | INTRAMUSCULAR | Status: DC | PRN

## 2013-05-20 MED ORDER — PROMETHAZINE HCL 25 MG/ML IJ SOLN: 6.2500 mg | INTRAMUSCULAR | Status: DC | PRN

## 2013-05-20 MED ORDER — POLYETHYLENE GLYCOL 3350 17 G PO PACK
17.0000 g | PACK | Freq: Every day | ORAL | Status: DC | PRN
  Filled 2013-05-20: qty 1

## 2013-05-20 MED ORDER — PROPOFOL 10 MG/ML IV BOLUS
INTRAVENOUS | Status: DC | PRN
  Administered 2013-05-20: 100 mg via INTRAVENOUS

## 2013-05-20 MED ORDER — SODIUM CHLORIDE 0.9 % IJ SOLN
3.0000 mL | Freq: Two times a day (BID) | INTRAMUSCULAR | Status: DC
  Administered 2013-05-20: 3 mL via INTRAVENOUS

## 2013-05-20 MED ORDER — MENTHOL 3 MG MT LOZG: 1.0000 | LOZENGE | OROMUCOSAL | Status: DC | PRN

## 2013-05-20 MED ORDER — DEXAMETHASONE SODIUM PHOSPHATE 10 MG/ML IJ SOLN
INTRAMUSCULAR | Status: DC | PRN
  Administered 2013-05-20: 10 mg via INTRAVENOUS

## 2013-05-20 SURGICAL SUPPLY — 69 items
ADH SKN CLS APL DERMABOND .7 (GAUZE/BANDAGES/DRESSINGS) ×1
APL SKNCLS STERI-STRIP NONHPOA (GAUZE/BANDAGES/DRESSINGS)
BAG DECANTER FOR FLEXI CONT (MISCELLANEOUS) ×2 IMPLANT
BANDAGE GAUZE ELAST BULKY 4 IN (GAUZE/BANDAGES/DRESSINGS) ×2 IMPLANT
BENZOIN TINCTURE PRP APPL 2/3 (GAUZE/BANDAGES/DRESSINGS) IMPLANT
BIT DRILL 2.3 12 FIXED (INSTRUMENTS) IMPLANT
BIT DRILL NEURO 2X3.1 SFT TUCH (MISCELLANEOUS) ×1 IMPLANT
BLADE ULTRA TIP 2M (BLADE) ×1 IMPLANT
BUR BARREL STRAIGHT FLUTE 4.0 (BURR) ×2 IMPLANT
CANISTER SUCTION 2500CC (MISCELLANEOUS) ×2 IMPLANT
CLOTH BEACON ORANGE TIMEOUT ST (SAFETY) ×2 IMPLANT
CONT SPEC 4OZ CLIKSEAL STRL BL (MISCELLANEOUS) ×2 IMPLANT
COVER MAYO STAND STRL (DRAPES) ×2 IMPLANT
DERMABOND ADVANCED (GAUZE/BANDAGES/DRESSINGS) ×1
DERMABOND ADVANCED .7 DNX12 (GAUZE/BANDAGES/DRESSINGS) ×1 IMPLANT
DRAPE LAPAROTOMY 100X72 PEDS (DRAPES) ×2 IMPLANT
DRAPE MICROSCOPE LEICA (MISCELLANEOUS) ×1 IMPLANT
DRAPE POUCH INSTRU U-SHP 10X18 (DRAPES) ×2 IMPLANT
DRAPE PROXIMA HALF (DRAPES) ×1 IMPLANT
DRESSING TELFA 8X3 (GAUZE/BANDAGES/DRESSINGS) IMPLANT
DRILL 12MM (INSTRUMENTS) ×2
DRILL NEURO 2X3.1 SOFT TOUCH (MISCELLANEOUS) ×2
DURAPREP 6ML APPLICATOR 50/CS (WOUND CARE) ×2 IMPLANT
ELECT COATED BLADE 2.86 ST (ELECTRODE) ×2 IMPLANT
ELECT REM PT RETURN 9FT ADLT (ELECTROSURGICAL) ×2
ELECTRODE REM PT RTRN 9FT ADLT (ELECTROSURGICAL) ×1 IMPLANT
GAUZE SPONGE 4X4 16PLY XRAY LF (GAUZE/BANDAGES/DRESSINGS) IMPLANT
GLOVE BIO SURGEON STRL SZ8 (GLOVE) ×2 IMPLANT
GLOVE BIOGEL PI IND STRL 8 (GLOVE) ×1 IMPLANT
GLOVE BIOGEL PI IND STRL 8.5 (GLOVE) ×1 IMPLANT
GLOVE BIOGEL PI INDICATOR 8 (GLOVE) ×1
GLOVE BIOGEL PI INDICATOR 8.5 (GLOVE) ×1
GLOVE ECLIPSE 8.0 STRL XLNG CF (GLOVE) ×2 IMPLANT
GLOVE EXAM NITRILE LRG STRL (GLOVE) IMPLANT
GLOVE EXAM NITRILE MD LF STRL (GLOVE) ×1 IMPLANT
GLOVE EXAM NITRILE XL STR (GLOVE) IMPLANT
GLOVE EXAM NITRILE XS STR PU (GLOVE) IMPLANT
GOWN BRE IMP SLV AUR LG STRL (GOWN DISPOSABLE) IMPLANT
GOWN BRE IMP SLV AUR XL STRL (GOWN DISPOSABLE) ×3 IMPLANT
GOWN STRL REIN 2XL LVL4 (GOWN DISPOSABLE) ×2 IMPLANT
HEAD HALTER (SOFTGOODS) ×2 IMPLANT
KIT BASIN OR (CUSTOM PROCEDURE TRAY) ×2 IMPLANT
KIT ROOM TURNOVER OR (KITS) ×2 IMPLANT
NDL HYPO 18GX1.5 BLUNT FILL (NEEDLE) ×1 IMPLANT
NDL HYPO 25X1 1.5 SAFETY (NEEDLE) ×1 IMPLANT
NDL SPNL 22GX3.5 QUINCKE BK (NEEDLE) ×1 IMPLANT
NEEDLE HYPO 18GX1.5 BLUNT FILL (NEEDLE) ×2 IMPLANT
NEEDLE HYPO 25X1 1.5 SAFETY (NEEDLE) ×2 IMPLANT
NEEDLE SPNL 22GX3.5 QUINCKE BK (NEEDLE) ×2 IMPLANT
NS IRRIG 1000ML POUR BTL (IV SOLUTION) ×2 IMPLANT
PACK LAMINECTOMY NEURO (CUSTOM PROCEDURE TRAY) ×2 IMPLANT
PAD ARMBOARD 7.5X6 YLW CONV (MISCELLANEOUS) ×2 IMPLANT
PIN DISTRACTION 14MM (PIN) ×4 IMPLANT
PLATE 28MM (Plate) ×1 IMPLANT
RUBBERBAND STERILE (MISCELLANEOUS) IMPLANT
SCREW 12MM (Screw) ×6 IMPLANT
SPACER ASSEM CERV LORD 6M (Spacer) ×3 IMPLANT
SPONGE GAUZE 4X4 12PLY (GAUZE/BANDAGES/DRESSINGS) IMPLANT
SPONGE INTESTINAL PEANUT (DISPOSABLE) ×2 IMPLANT
SPONGE SURGIFOAM ABS GEL SZ50 (HEMOSTASIS) ×1 IMPLANT
STAPLER SKIN PROX WIDE 3.9 (STAPLE) IMPLANT
STRIP CLOSURE SKIN 1/2X4 (GAUZE/BANDAGES/DRESSINGS) IMPLANT
SUT VIC AB 3-0 SH 8-18 (SUTURE) ×2 IMPLANT
SYR 20ML ECCENTRIC (SYRINGE) ×2 IMPLANT
SYR 3ML LL SCALE MARK (SYRINGE) ×2 IMPLANT
TOWEL OR 17X24 6PK STRL BLUE (TOWEL DISPOSABLE) ×2 IMPLANT
TOWEL OR 17X26 10 PK STRL BLUE (TOWEL DISPOSABLE) ×2 IMPLANT
TRAP SPECIMEN MUCOUS 40CC (MISCELLANEOUS) ×1 IMPLANT
WATER STERILE IRR 1000ML POUR (IV SOLUTION) ×2 IMPLANT

## 2013-05-20 NOTE — Transfer of Care (Signed)
Immediate Anesthesia Transfer of Care Note  Patient: BILLYE PICKEREL  Procedure(s) Performed: Procedure(s) with comments: ANTERIOR CERVICAL DECOMPRESSION/DISCECTOMY FUSION 2 LEVELS (N/A) - Cervical Seven-Thoracic One, Thoracic One-Two Anterior cervical decompression/Diskectomy/Fusion  Patient Location: PACU  Anesthesia Type:General  Level of Consciousness: sedated, patient cooperative and responds to stimulation  Airway & Oxygen Therapy: Patient connected to face mask oxygen  Post-op Assessment: Report given to PACU RN, Post -op Vital signs reviewed and stable and Patient moving all extremities X 4  Post vital signs: Reviewed and stable  Complications: No apparent anesthesia complications

## 2013-05-20 NOTE — Preoperative (Signed)
Beta Blockers   Reason not to administer Beta Blockers:Not Applicable 

## 2013-05-20 NOTE — Anesthesia Procedure Notes (Signed)
Procedure Name: Intubation Date/Time: 05/20/2013 4:40 PM Performed by: Tyrone Nine Pre-anesthesia Checklist: Emergency Drugs available, Patient identified, Timeout performed, Suction available and Patient being monitored Patient Re-evaluated:Patient Re-evaluated prior to inductionOxygen Delivery Method: Circle system utilized Preoxygenation: Pre-oxygenation with 100% oxygen Intubation Type: IV induction Ventilation: Mask ventilation without difficulty Laryngoscope Size: Mac and 3 Grade View: Grade I Tube type: Oral Number of attempts: 1 Airway Equipment and Method: Stylet Placement Confirmation: ETT inserted through vocal cords under direct vision,  breath sounds checked- equal and bilateral and positive ETCO2 Secured at: 21 cm Tube secured with: Tape Dental Injury: Teeth and Oropharynx as per pre-operative assessment

## 2013-05-20 NOTE — Anesthesia Preprocedure Evaluation (Addendum)
Anesthesia Evaluation  Patient identified by MRN, date of birth, ID band Patient awake    Reviewed: Allergy & Precautions, H&P , NPO status , Patient's Chart, lab work & pertinent test results  History of Anesthesia Complications Negative for: history of anesthetic complications  Airway Mallampati: II TM Distance: >3 FB Neck ROM: Full    Dental  (+) Teeth Intact and Dental Advisory Given   Pulmonary neg pulmonary ROS,  breath sounds clear to auscultation  Pulmonary exam normal       Cardiovascular Exercise Tolerance: Good negative cardio ROS  I+ Valvular Problems/Murmurs MVP Rhythm:Regular Rate:Normal     Neuro/Psych PSYCHIATRIC DISORDERS Anxiety Depression Right arm weakness/numb fingers  Neuromuscular disease negative neurological ROS     GI/Hepatic negative GI ROS, Neg liver ROS,   Endo/Other  negative endocrine ROS  Renal/GU negative Renal ROS     Musculoskeletal  (+) Arthritis -, Osteoarthritis,    Abdominal   Peds  Hematology negative hematology ROS (+)   Anesthesia Other Findings   Reproductive/Obstetrics                        Anesthesia Physical Anesthesia Plan  ASA: II  Anesthesia Plan: General   Post-op Pain Management:    Induction: Intravenous  Airway Management Planned: Oral ETT  Additional Equipment:   Intra-op Plan:   Post-operative Plan: Extubation in OR  Informed Consent: I have reviewed the patients History and Physical, chart, labs and discussed the procedure including the risks, benefits and alternatives for the proposed anesthesia with the patient or authorized representative who has indicated his/her understanding and acceptance.   Dental advisory given  Plan Discussed with: CRNA and Surgeon  Anesthesia Plan Comments: (Plan routine monitors, GETA)       Anesthesia Quick Evaluation

## 2013-05-20 NOTE — Progress Notes (Signed)
Sleepy, but arousable.  Improved strength in right hand.  MAEW.

## 2013-05-20 NOTE — Op Note (Signed)
05/20/2013  7:23 PM  PATIENT:  Jessica Casey  62 y.o. female  PRE-OPERATIVE DIAGNOSIS:  Cervicalgia, Cervical/Thoracic herniated nucleus pulposus, spondylosis, profound right hand weakness (C8 and T1 radiculopathies) C7/T1 and T1/T2  POST-OPERATIVE DIAGNOSIS:  Cervicalgia, Cervical/Thoracic herniated nucleus pulposus, spondylosis, profound right hand weakness (C8 and T1 radiculopathies) C7/T1 and T1/T2  PROCEDURE:  Procedure(s) with comments: ANTERIOR CERVICAL DECOMPRESSION/DISCECTOMY FUSION 2 LEVELS (N/A) - Cervical Seven-Thoracic One, Thoracic One-Two Anterior cervical decompression/Diskectomy/Fusion with allograft/autograft, plate  SURGEON:  Surgeon(s) and Role:    * Patsey Pitstick, MD - Primary    * Randy O Kritzer, MD - Assisting  PHYSICIAN ASSISTANT:   ASSISTANTS: Poteat, RN   ANESTHESIA:   general  EBL:  Total I/O In: 1000 [I.V.:1000] Out: -   BLOOD ADMINISTERED:none  DRAINS: none   LOCAL MEDICATIONS USED:  LIDOCAINE   SPECIMEN:  No Specimen  DISPOSITION OF SPECIMEN:  N/A  COUNTS:  YES  TOURNIQUET:  * No tourniquets in log *  DICTATION: Patient is 62 year old female with profound right hand weakness, arm pain  with HNP, spondylosis, radiculopathy C 7/T1 and T 1/T2 right  PROCEDURE: Patient was brought to operating room and following the smooth and uncomplicated induction of general endotracheal anesthesia her head was placed on a horseshoe head holder she was placed in 5 pounds of Holter traction and her anterior neck was prepped and draped in usual sterile fashion. An incision was made on the left side of midline after infiltrating the skin and subcutaneous tissues with local lidocaine. The platysmal layer was incised and subplatysmal dissection was performed exposing the anterior border sternocleidomastoid muscle. Using blunt dissection the carotid sheath was kept lateral and trachea and esophagus kept medial exposing the anterior cervical and upper thoracic spine. A  bent spinal needle was placed it was felt to be the C 7/T 1 level and this was confirmed on intraoperative x-ray. Longus coli muscles were taken down from the anterior cervico-thoracic spine using electrocautery and key elevator and self-retaining retractor was placed exposing the C7/T1 and T1/2 levels. The interspaces were incised and a thorough discectomy was performed. Distraction pins were placed. Initially the T1/2 level was operated. Uncinate spurs and central spondylitic ridges were drilled down with a high-speed drill. The spinal cord dura and both T1 nerve roots were widely decompressed. A large amount of disc material was removed from overlying the right T 1 nerve root. Hemostasis was assured. After trial sizing a machined lordotic 6 mm allograft was selected and packed with autograft. This was tamped into position and countersunk appropriately. Attention was the paid to the C 7/ T 1 level, where similar decompression was performed.  Uncinate spurs and central spondylitic ridges were drilled down with a high-speed drill. The spinal cord dura and both C8 nerve roots were widely decompressed. A large amount of disc material was removed from overlying the right C8 nerve root. Hemostasis was assured. After trial sizing a machined lordotic 6 mm allograft was selected and packed with autograft. This was tamped into position and countersunk appropriately. Distraction weight was removed. A 30 mm trestle luxe anterior cervical plate was affixed to the cervico-thoracic  spine with 12 mm variable-angle screws 2 at C7, 2 at T 1 and 2 at  T 2. All screws were well-positioned and locking mechanisms were engaged. A final X ray was obtained which poorly visualized grafts and anterior plate without complicating features. Soft tissues were inspected and found to be in good repair. The wound was   irrigated. The platysma layer was closed with 3-0 Vicryl stitches and the skin was reapproximated with 3-0 Vicryl subcuticular  stitches. The wound was dressed with Dermabond. Counts were correct at the end of the case. Patient was extubated and taken to recovery in stable and satisfactory condition.    PLAN OF CARE: Admit for overnight observation  PATIENT DISPOSITION:  PACU - hemodynamically stable.   Delay start of Pharmacological VTE agent (>24hrs) due to surgical blood loss or risk of bleeding: yes  

## 2013-05-20 NOTE — Brief Op Note (Signed)
05/20/2013  7:23 PM  PATIENT:  Jessica Casey  62 y.o. female  PRE-OPERATIVE DIAGNOSIS:  Cervicalgia, Cervical/Thoracic herniated nucleus pulposus, spondylosis, profound right hand weakness (C8 and T1 radiculopathies) C7/T1 and T1/T2  POST-OPERATIVE DIAGNOSIS:  Cervicalgia, Cervical/Thoracic herniated nucleus pulposus, spondylosis, profound right hand weakness (C8 and T1 radiculopathies) C7/T1 and T1/T2  PROCEDURE:  Procedure(s) with comments: ANTERIOR CERVICAL DECOMPRESSION/DISCECTOMY FUSION 2 LEVELS (N/A) - Cervical Seven-Thoracic One, Thoracic One-Two Anterior cervical decompression/Diskectomy/Fusion with allograft/autograft, plate  SURGEON:  Surgeon(s) and Role:    * Maeola Harman, MD - Primary    * Reinaldo Meeker, MD - Assisting  PHYSICIAN ASSISTANT:   ASSISTANTS: Poteat, RN   ANESTHESIA:   general  EBL:  Total I/O In: 1000 [I.V.:1000] Out: -   BLOOD ADMINISTERED:none  DRAINS: none   LOCAL MEDICATIONS USED:  LIDOCAINE   SPECIMEN:  No Specimen  DISPOSITION OF SPECIMEN:  N/A  COUNTS:  YES  TOURNIQUET:  * No tourniquets in log *  DICTATION: Patient is 62 year old female with profound right hand weakness, arm pain  with HNP, spondylosis, radiculopathy C 7/T1 and T 1/T2 right  PROCEDURE: Patient was brought to operating room and following the smooth and uncomplicated induction of general endotracheal anesthesia her head was placed on a horseshoe head holder she was placed in 5 pounds of Holter traction and her anterior neck was prepped and draped in usual sterile fashion. An incision was made on the left side of midline after infiltrating the skin and subcutaneous tissues with local lidocaine. The platysmal layer was incised and subplatysmal dissection was performed exposing the anterior border sternocleidomastoid muscle. Using blunt dissection the carotid sheath was kept lateral and trachea and esophagus kept medial exposing the anterior cervical and upper thoracic spine. A  bent spinal needle was placed it was felt to be the C 7/T 1 level and this was confirmed on intraoperative x-ray. Longus coli muscles were taken down from the anterior cervico-thoracic spine using electrocautery and key elevator and self-retaining retractor was placed exposing the C7/T1 and T1/2 levels. The interspaces were incised and a thorough discectomy was performed. Distraction pins were placed. Initially the T1/2 level was operated. Uncinate spurs and central spondylitic ridges were drilled down with a high-speed drill. The spinal cord dura and both T1 nerve roots were widely decompressed. A large amount of disc material was removed from overlying the right T 1 nerve root. Hemostasis was assured. After trial sizing a machined lordotic 6 mm allograft was selected and packed with autograft. This was tamped into position and countersunk appropriately. Attention was the paid to the C 7/ T 1 level, where similar decompression was performed.  Uncinate spurs and central spondylitic ridges were drilled down with a high-speed drill. The spinal cord dura and both C8 nerve roots were widely decompressed. A large amount of disc material was removed from overlying the right C8 nerve root. Hemostasis was assured. After trial sizing a machined lordotic 6 mm allograft was selected and packed with autograft. This was tamped into position and countersunk appropriately. Distraction weight was removed. A 30 mm trestle luxe anterior cervical plate was affixed to the cervico-thoracic  spine with 12 mm variable-angle screws 2 at C7, 2 at T 1 and 2 at  T 2. All screws were well-positioned and locking mechanisms were engaged. A final X ray was obtained which poorly visualized grafts and anterior plate without complicating features. Soft tissues were inspected and found to be in good repair. The wound was  irrigated. The platysma layer was closed with 3-0 Vicryl stitches and the skin was reapproximated with 3-0 Vicryl subcuticular  stitches. The wound was dressed with Dermabond. Counts were correct at the end of the case. Patient was extubated and taken to recovery in stable and satisfactory condition.    PLAN OF CARE: Admit for overnight observation  PATIENT DISPOSITION:  PACU - hemodynamically stable.   Delay start of Pharmacological VTE agent (>24hrs) due to surgical blood loss or risk of bleeding: yes

## 2013-05-20 NOTE — Anesthesia Postprocedure Evaluation (Signed)
Anesthesia Post Note  Patient: Jessica Casey  Procedure(s) Performed: Procedure(s) (LRB): ANTERIOR CERVICAL DECOMPRESSION/DISCECTOMY FUSION 2 LEVELS (N/A)  Anesthesia type: general  Patient location: PACU  Post pain: Pain level controlled  Post assessment: Patient's Cardiovascular Status Stable  Last Vitals:  Filed Vitals:   05/20/13 2030  BP: 124/63  Pulse: 94  Temp:   Resp:     Post vital signs: Reviewed and stable  Level of consciousness: sedated  Complications: No apparent anesthesia complications

## 2013-05-20 NOTE — Interval H&P Note (Signed)
History and Physical Interval Note:  05/20/2013 8:08 AM  Jessica Casey  has presented today for surgery, with the diagnosis of Cervicalgia, Cervical hnp, spondylosis  The various methods of treatment have been discussed with the patient and family. After consideration of risks, benefits and other options for treatment, the patient has consented to  Procedure(s) with comments: ANTERIOR CERVICAL DECOMPRESSION/DISCECTOMY FUSION 2 LEVELS (N/A) - C7-T1 T1-T2 Anterior cervical decompression/Diskectomy/Fusion as a surgical intervention .  The patient's history has been reviewed, patient examined, no change in status, stable for surgery.  I have reviewed the patient's chart and labs.  Questions were answered to the patient's satisfaction.     [name] D

## 2013-05-21 ENCOUNTER — Encounter (HOSPITAL_COMMUNITY): Payer: Self-pay | Admitting: Neurosurgery

## 2013-05-21 MED ORDER — DIAZEPAM 5 MG PO TABS: 5.0000 mg | ORAL_TABLET | Freq: Four times a day (QID) | ORAL | Status: DC | PRN

## 2013-05-21 MED ORDER — HYDROCODONE-ACETAMINOPHEN 5-325 MG PO TABS: 1.0000 | ORAL_TABLET | ORAL | Status: DC | PRN

## 2013-05-21 NOTE — Progress Notes (Signed)
Subjective: Patient reports doing well. Hand strength improved.  Objective: Vital signs in last 24 hours: Temp:  [97.5 F (36.4 C)-98.8 F (37.1 C)] 98.5 F (36.9 C) (08/22 0854) Pulse Rate:  [73-107] 81 (08/22 0854) Resp:  [10-20] 16 (08/22 0854) BP: (97-141)/(56-84) 97/61 mmHg (08/22 0854) SpO2:  [95 %-100 %] 95 % (08/22 0854)  Intake/Output from previous day: 08/21 0701 - 08/22 0700 In: 2350 [I.V.:2350] Out: 10 [Blood:10] Intake/Output this shift:    Physical Exam: Hand intrinsic strength better.  Wound C/D/I.  Voice good.   Lab Results: No results found for this basename: WBC, HGB, HCT, PLT,  in the last 72 hours BMET No results found for this basename: NA, K, CL, CO2, GLUCOSE, BUN, CREATININE, CALCIUM,  in the last 72 hours  Studies/Results: Dg Cervical Spine 2-3 Views  05/20/2013   **ADDENDUM** CREATED: 05/20/2013 19:38:07  Correction:  I performed further review of the intraoperative images.  The localization needle has a 90 degrees angulation which extends inferiorly. The angled tip of the needle is obscured.  The fusion has been performed at  C7-T1 and T1-2.**END ADDENDUM**  05/20/2013   *RADIOLOGY REPORT*  Clinical Data: Cervical spondylosis.  CERVICAL SPINE - 2-3 VIEW  Comparison: MRI dated 04/13/2013 and radiographs dated 04/26/2013  Findings: Radiograph #1 demonstrates a needle at the C7-T1 level. Radiograph #2 better demonstrates the needle at C7-T1.  Radiograph #3 appears to demonstrate anterior cervical fusion at T1-2 and T2- 3.  IMPRESSION: Radiograph #3 appears to demonstrate anterior cervical fusion at T1- 2 and T2-3.   Original Report Authenticated By: Francene Boyers, M.D.    Assessment/Plan: Doing well.  D/C home.    LOS: 1 day    Dorian Heckle, MD 05/21/2013, 10:02 AM

## 2013-05-21 NOTE — Progress Notes (Signed)
Orthopedic Tech Progress Note Patient Details:  Jessica Casey Jan 18, 1951 [id number]  Ortho Devices Type of Ortho Device: Soft collar Ortho Device/Splint Interventions: Application   Shawnie Pons 05/21/2013, 11:34 AM

## 2013-05-21 NOTE — Plan of Care (Signed)
Problem: Consults Goal: Diagnosis - Spinal Surgery Outcome: Completed/Met Date Met:  05/21/13 Cervical Spine Fusion     

## 2013-05-21 NOTE — Discharge Summary (Signed)
Physician Discharge Summary  Patient ID: Jessica Casey MRN: [id number] DOB/AGE: 1951-02-15 62 y.o.  Admit date: 05/20/2013 Discharge date: 05/21/2013  Admission Diagnoses:HNP, spondylosis, DDD, radiculopathy C7/T1, T1/2  Discharge Diagnoses: HNP, spondylosis, DDD, radiculopathy C7/T1, T1/2 Active Problems:   * No active hospital problems. *   Discharged Condition: good  Hospital Course: Unremarkable recovery following ACDF C7/T1 and T1/2  Consults: None  Significant Diagnostic Studies: None  Treatments: surgery: Anterior Cervical Decompression and Fusion C7/T1 and T1/2  Discharge Exam: Blood pressure 97/61, pulse 81, temperature 98.5 F (36.9 C), temperature source Oral, resp. rate 16, SpO2 95.00%. Neurologic: Alert and oriented X 3, normal strength and tone. Normal symmetric reflexes. Normal coordination and gait Incision/Wound:Wound C/D/I  Disposition: 01-Home or Self Care   Future Appointments Provider Department Dept Phone   05/16/2014 11:15 AM Melony Overly, MD [facility] [phone]       Medication List         acetaminophen 500 MG tablet  Commonly known as:  TYLENOL  Take 1,000 mg by mouth daily as needed for pain.     alendronate 70 MG tablet  Commonly known as:  FOSAMAX  Take 1 tablet (70 mg total) by mouth every Sunday. Take with a full glass of water on an empty stomach.     ARIPiprazole 5 MG tablet  Commonly known as:  ABILIFY  Take 5 mg by mouth every morning.     aspirin 81 MG tablet  Take 81 mg by mouth daily.     Biotin 300 MCG Tabs  Take 300 mcg by mouth daily.     calcium carbonate 600 MG Tabs tablet  Commonly known as:  OS-CAL  Take 600 mg by mouth daily.     diazepam 5 MG tablet  Commonly known as:  VALIUM  Take 1 tablet (5 mg total) by mouth every 6 (six) hours as needed.     DULoxetine 60 MG capsule  Commonly known as:  CYMBALTA  Take 60 mg by mouth daily before breakfast.     HYDROcodone-acetaminophen  5-325 MG per tablet  Commonly known as:  NORCO/VICODIN  Take 1-2 tablets by mouth every 4 (four) hours as needed for pain.     HYDROcodone-acetaminophen 5-325 MG per tablet  Commonly known as:  NORCO/VICODIN  Take 1 tablet by mouth every 6 (six) hours as needed for pain.         Signed: Ege Muckey D 05/21/2013, 10:04 AM

## 2013-05-21 NOTE — Progress Notes (Signed)
Pt doing very well. Pt and husband given D/C instructions with Rx's, verbal understanding given. Pt received soft collar prior to D/C per MD order. Pt D/C'd home via wheelchair @ 1113 per MD order. Rema Fendt, RN

## 2013-05-21 NOTE — Plan of Care (Signed)
Problem: Consults Goal: Diagnosis - Spinal Surgery Cervical Spine Fusion     

## 2013-06-02 ENCOUNTER — Telehealth: Payer: Self-pay | Admitting: Internal Medicine

## 2013-06-02 NOTE — Telephone Encounter (Signed)
Noted, will follow up with pt if symptoms persist.

## 2013-06-02 NOTE — Telephone Encounter (Signed)
Patient Information:  Caller Name: [facility]  Phone: [phone]  Patient: Jessica Casey, Jessica Casey  Gender: Female  DOB: 10/15/1950  Age: 62 Years  PCP: Marga Melnick  Office Follow Up:  Does the office need to follow up with this patient?: No  Instructions For The Office: N/A  RN Note:  Out of town; currently in [place], returning 06/06/13. Stressed hydration and humidification.  Try warm clear fluids and honey.  Recommended plain guaifenesin during daytime and DM at night, if unable to sleep.  Symptoms  Reason For Call & Symptoms: Hacking cough with occasional clear or yellow phlem.  Had C7-T2 disk surgery 05/20/13.  Reviewed Health History In EMR: Yes  Reviewed Medications In EMR: Yes  Reviewed Allergies In EMR: Yes  Reviewed Surgeries / Procedures: Yes  Date of Onset of Symptoms: 05/25/2013  Treatments Tried: Robitussin DM, Mucinex DM.  Treatments Tried Worked: No  Guideline(s) Used:  Cough  Disposition Per Guideline:   Home Care  Reason For Disposition Reached:   Cough with no complications  Advice Given:  Reassurance  Coughing is the way that our lungs remove irritants and mucus. It helps protect our lungs from getting pneumonia.  You can get a dry hacking cough after a chest cold. Sometimes this type of cough can last 1-3 weeks, and be worse at night.  Cough Medicines:  OTC Cough Drops: Cough drops can help a lot, especially for mild coughs. They reduce coughing by soothing your irritated throat and removing that tickle sensation in the back of the throat. Cough drops also have the advantage of portability - you can carry them with you.  Home Remedy - Hard Candy: Hard candy works just as well as medicine-flavored OTC cough drops. Diabetics should use sugar-free candy.  Home Remedy - Honey: This old home remedy has been shown to help decrease coughing at night. The adult dosage is 2 teaspoons (10 ml) at bedtime. Honey should not be given to infants under one year of age.  Caution - Dextromethorphan:   Do not try to completely suppress coughs that produce mucus and phlegm. Remember that coughing is helpful in bringing up mucus from the lungs and preventing pneumonia.  Research Notes: Dextromethorphan in some research studies has been shown to reduce the frequency and severity of cough in adults (18 years or older) without significant adverse effects. However, other studies suggest that dextromethorphan is no better than placebo at reducing a cough.  Coughing Spasms:  Drink warm fluids. Inhale warm mist (Reason: both relax the airway and loosen up the phlegm).  Suck on cough drops or hard candy to coat the irritated throat.  Prevent Dehydration:  Drink adequate liquids.  This will help soothe an irritated or dry throat and loosen up the phlegm.  Expected Course:   The expected course depends on what is causing the cough.  Viral bronchitis (chest cold) causes a cough that lasts 1 to 3 weeks. Sometimes you may cough up lots of phlegm (sputum, mucus). The mucus can normally be white, gray, yellow, or green.  Call Back If:  Difficulty breathing  Cough lasts more than 3 weeks  Fever lasts > 3 days  You become worse.  Patient Will Follow Care Advice:  YES

## 2013-06-07 ENCOUNTER — Ambulatory Visit (INDEPENDENT_AMBULATORY_CARE_PROVIDER_SITE_OTHER): Payer: BC Managed Care – PPO | Admitting: Family Medicine

## 2013-06-07 VITALS — BP 108/77 | HR 87 | Temp 98.4°F | Wt 127.0 lb

## 2013-06-07 DIAGNOSIS — J209 Acute bronchitis, unspecified: Secondary | ICD-10-CM

## 2013-06-07 MED ORDER — AZITHROMYCIN 250 MG PO TABS: ORAL_TABLET | ORAL | Status: DC

## 2013-06-07 MED ORDER — GUAIFENESIN-CODEINE 100-10 MG/5ML PO SYRP: ORAL_SOLUTION | ORAL | Status: DC

## 2013-06-07 NOTE — Patient Instructions (Signed)

## 2013-06-07 NOTE — Progress Notes (Signed)
  Subjective:     Jessica Casey is a 62 y.o. female here for evaluation of a cough. Onset of symptoms was 2 weeks ago. Symptoms have been gradually worsening since that time. The cough is productive and is aggravated by infection. Associated symptoms include: postnasal drip, shortness of breath and sputum production. Patient does not have a history of asthma. Patient does not have a history of environmental allergens. Patient has not traveled recently. Patient does not have a history of smoking. Patient has not had a previous chest x-ray. Patient has not had a PPD done.  The following portions of the patient's history were reviewed and updated as appropriate: allergies, current medications, past family history, past medical history, past social history, past surgical history and problem list.  Review of Systems Pertinent items are noted in HPI.    Objective:    Oxygen saturation 96% on room air Filed Vitals:   06/08/13 0835  BP: 108/77  Pulse: 87  Temp: 98.4 F (36.9 C)  TempSrc: Oral  Weight: 127 lb (57.607 kg)  SpO2: 96%    General appearance: alert, cooperative, appears stated age and no distress Ears: normal TM's and external ear canals both ears Nose: Nares normal. Septum midline. Mucosa normal. No drainage or sinus tenderness. Throat: lips, mucosa, and tongue normal; teeth and gums normal Neck: no adenopathy, supple, symmetrical, trachea midline and thyroid not enlarged, symmetric, no tenderness/mass/nodules Lungs: rhonchi bilaterally Heart: S1, S2 normal    Assessment:    Acute Bronchitis    Plan:    Antibiotics per medication orders. Antitussives per medication orders. Avoid exposure to tobacco smoke and fumes. Call if shortness of breath worsens, blood in sputum, change in character of cough, development of fever or chills, inability to maintain nutrition and hydration. Avoid exposure to tobacco smoke and fumes. rto prn

## 2013-06-08 ENCOUNTER — Encounter: Payer: Self-pay | Admitting: Family Medicine

## 2013-06-13 ENCOUNTER — Encounter: Payer: Self-pay | Admitting: Obstetrics and Gynecology

## 2013-06-16 ENCOUNTER — Telehealth: Payer: Self-pay | Admitting: Obstetrics and Gynecology

## 2013-06-16 NOTE — Telephone Encounter (Signed)
Phone call to discuss bone density from [facility] on 06/07/13.  Patient is on Foxamax.  Bone denstiy from 2012 - T score - 2.5  Bone density from 2014 - T score - 2.4  Stable bone density.  Continue Fosamax.  Recheck bone density in  2 years.

## 2013-06-27 ENCOUNTER — Encounter: Payer: Self-pay | Admitting: Internal Medicine

## 2013-08-05 ENCOUNTER — Other Ambulatory Visit: Payer: Self-pay

## 2013-09-10 ENCOUNTER — Encounter: Payer: Self-pay | Admitting: Family Medicine

## 2013-09-10 ENCOUNTER — Ambulatory Visit (INDEPENDENT_AMBULATORY_CARE_PROVIDER_SITE_OTHER): Payer: BC Managed Care – PPO | Admitting: Family Medicine

## 2013-09-10 VITALS — BP 124/72 | HR 78 | Temp 98.6°F | Resp 16 | Wt 129.4 lb

## 2013-09-10 DIAGNOSIS — J019 Acute sinusitis, unspecified: Secondary | ICD-10-CM | POA: Insufficient documentation

## 2013-09-10 MED ORDER — AMOXICILLIN 875 MG PO TABS: 875.0000 mg | ORAL_TABLET | Freq: Two times a day (BID) | ORAL | Status: DC

## 2013-09-10 NOTE — Progress Notes (Signed)
   Subjective:    Patient ID: Jessica Casey, female    DOB: 12/15/1950, 62 y.o.   MRN: [id number]  HPI URI- sxs started 2 weeks ago.  Initially thought it was a virus 'but it's too long'.  Started w/ laryngitis, sore throat intermittently, pain in soft palate.  + sinus pressure.  Nasal congestion- blood tinged.  Will having coughing spells but not regularly coughing.  No fevers.  + sick contacts.  No N/V/D.   Review of Systems For ROS see HPI     Objective:   Physical Exam  Vitals reviewed. Constitutional: She appears well-developed and well-nourished. No distress.  HENT:  Head: Normocephalic and atraumatic.  Right Ear: Tympanic membrane normal.  Left Ear: Tympanic membrane normal.  Nose: Mucosal edema and rhinorrhea present. Right sinus exhibits maxillary sinus tenderness and frontal sinus tenderness. Left sinus exhibits maxillary sinus tenderness and frontal sinus tenderness.  Mouth/Throat: Uvula is midline and mucous membranes are normal. Posterior oropharyngeal erythema present. No oropharyngeal exudate.  Eyes: Conjunctivae and EOM are normal. Pupils are equal, round, and reactive to light.  Neck: Normal range of motion. Neck supple.  Cardiovascular: Normal rate, regular rhythm and normal heart sounds.   Pulmonary/Chest: Effort normal and breath sounds normal. No respiratory distress. She has no wheezes.  Lymphadenopathy:    She has no cervical adenopathy.          Assessment & Plan:

## 2013-09-10 NOTE — Assessment & Plan Note (Signed)
Pt's sxs and PE consistent w/ infxn.  Start abx.  Cough meds prn.  Reviewed supportive care and red flags that should prompt return.  Pt expressed understanding and is in agreement w/ plan.  

## 2013-09-10 NOTE — Progress Notes (Signed)
Pre visit review using our clinic review tool, if applicable. No additional management support is needed unless otherwise documented below in the visit note. 

## 2013-09-10 NOTE — Patient Instructions (Signed)
Follow up as needed Start the Amoxicillin twice daily- take w/ food Drink plenty of fluids REST! Mucinex DM for cough and congestion Call with any questions or concerns Hang in there! Happy Holidays!

## 2013-10-08 ENCOUNTER — Ambulatory Visit (INDEPENDENT_AMBULATORY_CARE_PROVIDER_SITE_OTHER): Payer: BC Managed Care – PPO | Admitting: Obstetrics and Gynecology

## 2013-10-08 ENCOUNTER — Encounter: Payer: Self-pay | Admitting: Obstetrics and Gynecology

## 2013-10-08 ENCOUNTER — Telehealth: Payer: Self-pay | Admitting: Obstetrics and Gynecology

## 2013-10-08 VITALS — BP 114/78 | HR 76 | Resp 18 | Ht 59.0 in | Wt 130.0 lb

## 2013-10-08 DIAGNOSIS — R350 Frequency of micturition: Secondary | ICD-10-CM

## 2013-10-08 LAB — POCT URINALYSIS DIPSTICK
Blood, UA: 1
UROBILINOGEN UA: NEGATIVE
pH, UA: 5

## 2013-10-08 MED ORDER — PHENAZOPYRIDINE HCL 100 MG PO TABS: 100.0000 mg | ORAL_TABLET | Freq: Three times a day (TID) | ORAL | Status: DC | PRN

## 2013-10-08 MED ORDER — CIPROFLOXACIN HCL 500 MG PO TABS: 500.0000 mg | ORAL_TABLET | Freq: Two times a day (BID) | ORAL | Status: DC

## 2013-10-08 MED ORDER — FLUCONAZOLE 150 MG PO TABS: 150.0000 mg | ORAL_TABLET | Freq: Once | ORAL | Status: DC

## 2013-10-08 NOTE — Patient Instructions (Signed)
Call for worsening symptoms or fever.

## 2013-10-08 NOTE — Telephone Encounter (Signed)
Spoke with pt who has been having frequency for a few days, and only urinates a small amount each time and has trouble making to the bathroom in time. 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 She is wondering if she could come in and give a urine sample, keeping the kids with her to watch them the whole time? Advised I would inquire and call her right back.   Per SY, called pt back and advised that it would be best for her to go to Urgent Care, as pt does not have recent history of recurrent UTI and would require full OV. Apologetic to pt, but advised we cannot accommodate the young children. Pt appreciative and agreeable to Urgent Care.

## 2013-10-08 NOTE — Telephone Encounter (Signed)
Phone call to patient.  She is here now for UTI evaluation and care.

## 2013-10-08 NOTE — Telephone Encounter (Signed)
Pt has an UTI and cannot come in unless she can bring her two small children with her. Please call patient

## 2013-10-08 NOTE — Progress Notes (Signed)
Patient ID: Jessica Casey, female   DOB: [birth year], 63 y.o.   MRN: [id number]  Subjective  Patient presents with throbbing in the urethra during and after voiding. Denies fever, nausea, vomiting, back pain, or blood in the urine.  Last UTI was many years ago.  Objective  T 98.6 Gen - NAD Lungs - CTA bilaterally. Cor - S1S2 RRR. Abdomen - Soft, nontender, no masses. Back - No CVA tenderness  Urine - + WBCs, + RBCs, and + nitrites.  Assessment  UTI  Plan  Urine culture. Ciprofloxacin 500 mg po bid for 7 days. Pyridium 100 mg po tid prn. Diflucan 150 mg po x 1 prn yeast infection.   See EPIC for orders. Call for worsening symptoms, fever, or any other concern.  After visit summary to the patient.   15 minutes face to face time of which over 50% was spent in counseling.

## 2013-10-10 LAB — URINE CULTURE

## 2013-12-08 ENCOUNTER — Telehealth: Payer: Self-pay | Admitting: Obstetrics and Gynecology

## 2013-12-08 ENCOUNTER — Encounter: Payer: Self-pay | Admitting: Obstetrics and Gynecology

## 2013-12-08 ENCOUNTER — Ambulatory Visit (INDEPENDENT_AMBULATORY_CARE_PROVIDER_SITE_OTHER): Payer: BC Managed Care – PPO | Admitting: Obstetrics and Gynecology

## 2013-12-08 VITALS — BP 104/65 | HR 81 | Resp 18 | Ht 59.0 in | Wt 125.0 lb

## 2013-12-08 DIAGNOSIS — N952 Postmenopausal atrophic vaginitis: Secondary | ICD-10-CM

## 2013-12-08 MED ORDER — ESTROGENS, CONJUGATED 0.625 MG/GM VA CREA: 1.0000 | TOPICAL_CREAM | Freq: Every day | VAGINAL | Status: DC

## 2013-12-08 NOTE — Telephone Encounter (Signed)
Patient calling with increased vaginal irritation and discharge. Denies urinary symptoms. Treated with diflucan left from UTI treatment one month ago on 3/7 and 3/9 with no relief. Requests appointment today as she is "very uncomfortable". Office visit scheduled at 1145 with Dr. Quincy Simmonds (time per Gay Filler).    Routing to provider for final review. Patient agreeable to disposition. Will close encounter

## 2013-12-08 NOTE — Telephone Encounter (Signed)
Patient thinks she has some type of vaginal infection. She thought it was a yeast infection had medication that silva gave her before and took that but it didn't change anything. symptoms are itching, very sore, discharge(white). She said she doesn't feel that its a UTI either bc she doesn't have the same symptoms she has had before when she had a uti. There are no appts that i can put her in for silva today or with a np could you find something for her.

## 2013-12-08 NOTE — Progress Notes (Signed)
GYNECOLOGY  VISIT   HPI: 63 y.o.   Married  Caucasian  female   G2P2002 with No LMP recorded. Patient is postmenopausal.   here for  Vaginal Discharge/itching. No urinary frequency.  Notes redness of vulva and brown discharge.  Denies odor.  Self treated with Diflucan course that she had left over and no change in symptoms.   Increased Wellbutrin to 300 mg.  Feeling better.  Seeing Dr. Caprice Beaver.   Mouth dry, preceded recent increase in the Wellbutrin.  No new exposures.  Patient notes vaginal dryness but not on estrogen cream.   Mother and sister with estrogen receptor positive breast cancer.   GYNECOLOGIC HISTORY: No LMP recorded. Patient is postmenopausal. Contraception:   Postmenopausal. Menopausal hormone therapy: no Mammogram July 2014 - WNL.  OB History   Grav Para Term Preterm Abortions TAB SAB Ect Mult Living   2 2 2       2          Patient Active Problem List   Diagnosis Date Noted  . Sinusitis, acute 09/10/2013  . Anemia 02/17/2013  . Hypoalbuminemia 02/17/2013  . Unspecified vitamin D deficiency 02/17/2013  . DEPRESSION 08/02/2009  . Osteopenia 08/02/2009  . FATIGUE 08/02/2009  . COLONIC POLYPS, HX OF 08/02/2009  . DEGENERATIVE DISC DISEASE 10/27/2008  . CERVICAL RADICULOPATHY, LEFT 10/27/2008    Past Medical History  Diagnosis Date  . DDD (degenerative disc disease)   . Depression   . Osteoporosis   . Colonic polyp 2005 & 2013  . Heart murmur     MVP- echo- 2010, no symptoms   . H/O echocardiogram     before 2000, no need for f/u /w cardiac   . Mononucleosis 1971  . Pinched nerve in neck     Past Surgical History  Procedure Laterality Date  . Umbilical hernia repair    . Cervical fusion      2 proceduresr Vertell Limber  . Lumbar disc surgery      Dr.Stern  . Rotator cuff repair      Dr.Wainer  . Colonoscopy with polypectomy  2013    Dr Deatra Ina  . Knee surgery      Dr Wonda Olds ; bursa cystectomy  . Carpal tunnel release Right   . Cataract  extraction, bilateral  2013    Dr Gershon Crane, [facility]  . Hernia repair    . Fracture surgery      L wrist - June 2014  . Wrist fracture surgery Left 02/2013    Dr. Kathryne Hitch  . Anterior cervical decomp/discectomy fusion N/A 05/20/2013    Procedure: ANTERIOR CERVICAL DECOMPRESSION/DISCECTOMY FUSION 2 LEVELS;  Surgeon: Erline Levine, MD;  Location: [facility];  Service: Neurosurgery;  Laterality: N/A;  Cervical Seven-Thoracic One, Thoracic One-Two Anterior cervical decompression/Diskectomy/Fusion    Current Outpatient Prescriptions  Medication Sig Dispense Refill  . alendronate (FOSAMAX) 70 MG tablet Take 1 tablet (70 mg total) by mouth every Sunday. Take with a full glass of water on an empty stomach.  12 tablet  3  . aspirin 81 MG tablet Take 81 mg by mouth daily.        . Biotin 300 MCG TABS Take 300 mcg by mouth daily.        Marland Kitchen buPROPion (WELLBUTRIN XL) 300 MG 24 hr tablet Take 300 mg by mouth daily.      . Cholecalciferol (VITAMIN D) 2000 UNITS CAPS Take by mouth.      . escitalopram (LEXAPRO) 20 MG tablet Take  20 mg by mouth daily.      . Omega-3 Fatty Acids (FISH OIL PO) Take by mouth.      . fluconazole (DIFLUCAN) 150 MG tablet Take 1 tablet (150 mg total) by mouth once. Take one tablet.  Repeat in 48 hours if symptoms are not completely resolved.  2 tablet  0  . phenazopyridine (PYRIDIUM) 100 MG tablet Take 1 tablet (100 mg total) by mouth 3 (three) times daily as needed for pain.  10 tablet  0   No current facility-administered medications for this visit.     ALLERGIES: Gabapentin  Family History  Problem Relation Age of Onset  . Diabetes Father   . Coronary artery disease Father     S/P CBAG , no MI  . Depression Father   . Diverticulosis Father   . Breast cancer Mother   . Stroke Mother 67  . Breast cancer Sister   . Depression Sister   . Depression Sister   . Depression Sister   . Depression Sister   . Depression Brother   . Bipolar disorder Other     Neice  .  Colon polyps Sister   . Colon cancer Neg Hx     History   Social History  . Marital Status: Married    Spouse Name: N/A    Number of Children: N/A  . Years of Education: N/A   Occupational History  . Teacher    Social History Main Topics  . Smoking status: Never Smoker   . Smokeless tobacco: Not on file  . Alcohol Use: Yes     Comment:  very rarely  . Drug Use: No  . Sexual Activity: Yes    Birth Control/ Protection: Post-menopausal   Other Topics Concern  . Not on file   Social History Narrative  . No narrative on file    ROS:  Pertinent items are noted in HPI.  PHYSICAL EXAMINATION:    BP 104/65  Pulse 81  Resp 18  Ht 4\' 11"  (1.499 m)  Wt 125 lb (56.7 kg)  BMI 25.23 kg/m2     General appearance: alert, cooperative and appears stated age  Pelvic: External genitalia:  Erythema of vulva.  No lesions.               Urethra:  normal appearing urethra with no masses, tenderness or lesions              Bartholins and Skenes: normal                 Vagina: normal appearing vagina, no lesions, small amount of thin yellow discharge.               Cervix: normal appearance                   Bimanual Exam:  Uterus:  uterus is normal size, shape, consistency and nontender                                      Adnexa: normal adnexa in size, nontender and no masses  Wet prep - pH 5.5, negative for clue cells, yeast, and trichomonas.  ASSESSMENT  Atrophic vaginitis.   PLAN  Will treat with Premarin vaginal cream.  See Epic orders.  Counseled on medication and proper use.  Discussed potential risks of breast cancer, DVT, PE, MI, stroke.  Return prn and  for annual exam.   An After Visit Summary was printed and given to the patient.  _15_____ minutes face to face time of which over 50% was spent in counseling.

## 2013-12-08 NOTE — Patient Instructions (Signed)
Conjugated Estrogens vaginal cream What is this medicine? CONJUGATED ESTROGENS (CON ju gate ed ESS troe jenz) are a mixture of female hormones. This cream can help relieve symptoms associated with menopause.like vaginal dryness and irritation. This medicine may be used for other purposes; ask your health care provider or pharmacist if you have questions. COMMON BRAND NAME(S): Premarin What should I tell my health care provider before I take this medicine? They need to know if you have any of these conditions: -abnormal vaginal bleeding -blood vessel disease or blood clots -breast, cervical, endometrial, or uterine cancer -dementia -diabetes -gallbladder disease -heart disease or recent heart attack -high blood pressure -high cholesterol -high level of calcium in the blood -hysterectomy -kidney disease -liver disease -migraine headaches -protein C deficiency -protein S deficiency -stroke -systemic lupus erythematosus (SLE) -tobacco smoker -an unusual or allergic reaction to estrogens other medicines, foods, dyes, or preservatives -pregnant or trying to get pregnant -breast-feeding How should I use this medicine? This medicine is for use in the vagina only. Do not take by mouth. Follow the directions on the prescription label. Use at bedtime unless otherwise directed by your doctor or health care professional. Use the special applicator supplied with the cream. Wash hands before and after use. Fill the applicator with the cream and remove from the tube. Lie on your back, part and bend your knees. Insert the applicator into the vagina and push the plunger to expel the cream into the vagina. Wash the applicator with warm soapy water and rinse well. Use exactly as directed for the complete length of time prescribed. Do not stop using except on the advice of your doctor or health care professional. Talk to your pediatrician regarding the use of this medicine in children. Special care may be  needed. A patient package insert for the product will be given with each prescription and refill. Read this sheet carefully each time. The sheet may change frequently. Overdosage: If you think you have taken too much of this medicine contact a poison control center or emergency room at once. NOTE: This medicine is only for you. Do not share this medicine with others. What if I miss a dose? If you miss a dose, use it as soon as you can. If it is almost time for your next dose, use only that dose. Do not use double or extra doses. What may interact with this medicine? Do not take this medicine with any of the following medications: -aromatase inhibitors like aminoglutethimide, anastrozole, exemestane, letrozole, testolactone This medicine may also interact with the following medications: -barbiturates used for inducing sleep or treating seizures -carbamazepine -grapefruit juice -medicines for fungal infections like itraconazole and ketoconazole -raloxifene or tamoxifen -rifabutin -rifampin -rifapentine -ritonavir -some antibiotics used to treat infections -St. John's Wort -warfarin This list may not describe all possible interactions. Give your health care provider a list of all the medicines, herbs, non-prescription drugs, or dietary supplements you use. Also tell them if you smoke, drink alcohol, or use illegal drugs. Some items may interact with your medicine. What should I watch for while using this medicine? Visit your health care professional for regular checks on your progress. You will need a regular breast and pelvic exam. You should also discuss the need for regular mammograms with your health care professional, and follow his or her guidelines. This medicine can make your body retain fluid, making your fingers, hands, or ankles swell. Your blood pressure can go up. Contact your doctor or health care professional if you  feel you are retaining fluid. If you have any reason to think  you are pregnant; stop taking this medicine at once and contact your doctor or health care professional. Tobacco smoking increases the risk of getting a blood clot or having a stroke, especially if you are more than 64 years old. You are strongly advised not to smoke. If you wear contact lenses and notice visual changes, or if the lenses begin to feel uncomfortable, consult your eye care specialist. If you are going to have elective surgery, you may need to stop taking this medicine beforehand. Consult your health care professional for advice prior to scheduling the surgery. What side effects may I notice from receiving this medicine? Side effects that you should report to your doctor or health care professional as soon as possible: -allergic reactions like skin rash, itching or hives, swelling of the face, lips, or tongue -breast tissue changes or discharge -changes in vision -chest pain -confusion, trouble speaking or understanding -dark urine -general ill feeling or flu-like symptoms -light-colored stools -nausea, vomiting -pain, swelling, warmth in the leg -right upper belly pain -severe headaches -shortness of breath -sudden numbness or weakness of the face, arm or leg -trouble walking, dizziness, loss of balance or coordination -unusual vaginal bleeding -yellowing of the eyes or skin Side effects that usually do not require medical attention (report to your doctor or health care professional if they continue or are bothersome): -hair loss -increased hunger or thirst -increased urination -symptoms of vaginal infection like itching, irritation or unusual discharge -unusually weak or tired This list may not describe all possible side effects. Call your doctor for medical advice about side effects. You may report side effects to FDA at 1-800-FDA-1088. Where should I keep my medicine? Keep out of the reach of children. Store at room temperature between 15 and 30 degrees C (59 and 86  degrees F). Throw away any unused medicine after the expiration date. NOTE: This sheet is a summary. It may not cover all possible information. If you have questions about this medicine, talk to your doctor, pharmacist, or health care provider.  2014, Elsevier/Gold Standard. (2010-12-19 09:20:36)   Atrophic Vaginitis Atrophic vaginitis is a problem of low levels of estrogen in women. This problem can happen at any age. It is most common in women who have gone through menopause ("the change").  HOW WILL I KNOW IF I HAVE THIS PROBLEM? You may have:  Trouble with peeing (urinating), such as:  Going to the bathroom often.  A hard time holding your pee until you reach a bathroom.  Leaking pee.  Having pain when you pee.  Itching or a burning feeling.  Vaginal bleeding and spotting.  Pain during sex.  Dryness of the vagina.  A yellow, bad-smelling fluid (discharge) coming from the vagina. HOW WILL MY DOCTOR CHECK FOR THIS PROBLEM?  During your exam, your doctor will likely find the problem.  If there is a vaginal fluid, it may be checked for infection. HOW WILL THIS PROBLEM BE TREATED? Keep the vulvar skin as clean as possible. Moisturizers and lubricants can help with some of the symptoms. Estrogen replacement can help. There are 2 ways to take estrogen:  Systemic estrogen gets estrogen to your whole body. It takes many weeks or months before the symptoms get better.  You take an estrogen pill.  You use a skin patch. This is a patch that you put on your skin.  If you still have your uterus, your doctor may ask  you to take a hormone. Talk to your doctor about the right medicine for you.  Estrogen cream.  This puts estrogen only at the part of your body where you apply it. The cream is put into the vagina or put on the vulvar skin. For some women, estrogen cream works faster than pills or the patch. CAN ALL WOMEN WITH THIS PROBLEM USE ESTROGEN? No. Women with certain types  of cancer, liver problems, or problems with blood clots should not take estrogen. Your doctor can help you decide the best treatment for your symptoms. Document Released: 03/04/2008 Document Revised: 12/09/2011 Document Reviewed: 03/04/2008 [employer] Patient Information 2014 [place], [place].

## 2013-12-09 ENCOUNTER — Ambulatory Visit: Payer: BC Managed Care – PPO | Admitting: Obstetrics and Gynecology

## 2014-02-07 IMAGING — CR DG WRIST COMPLETE 3+V*L*
2 series · 2 of 2 positions shown · non-contrast
Comparison: None.

CLINICAL DATA: Fall.  Left wrist injury.

LEFT WRIST - COMPLETE 3+ VIEW

[view not recorded (1 of 2)]
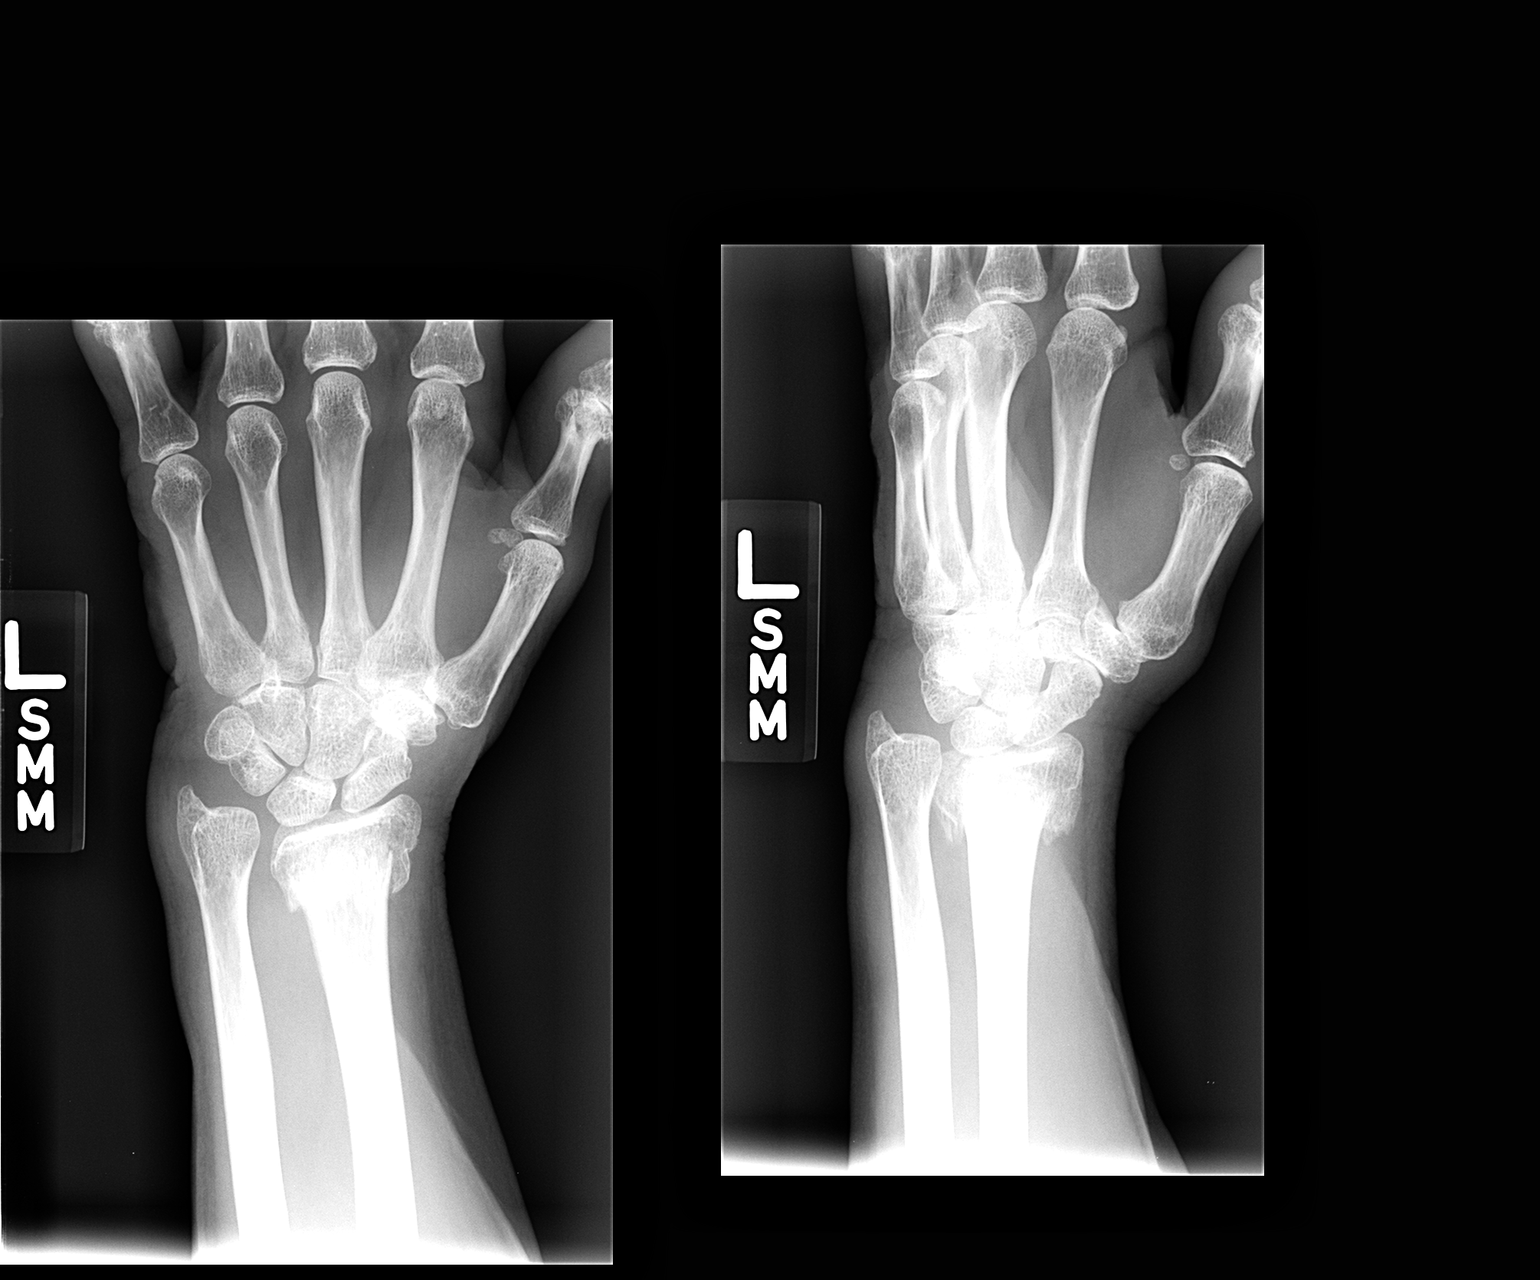

[view not recorded (2 of 2)]
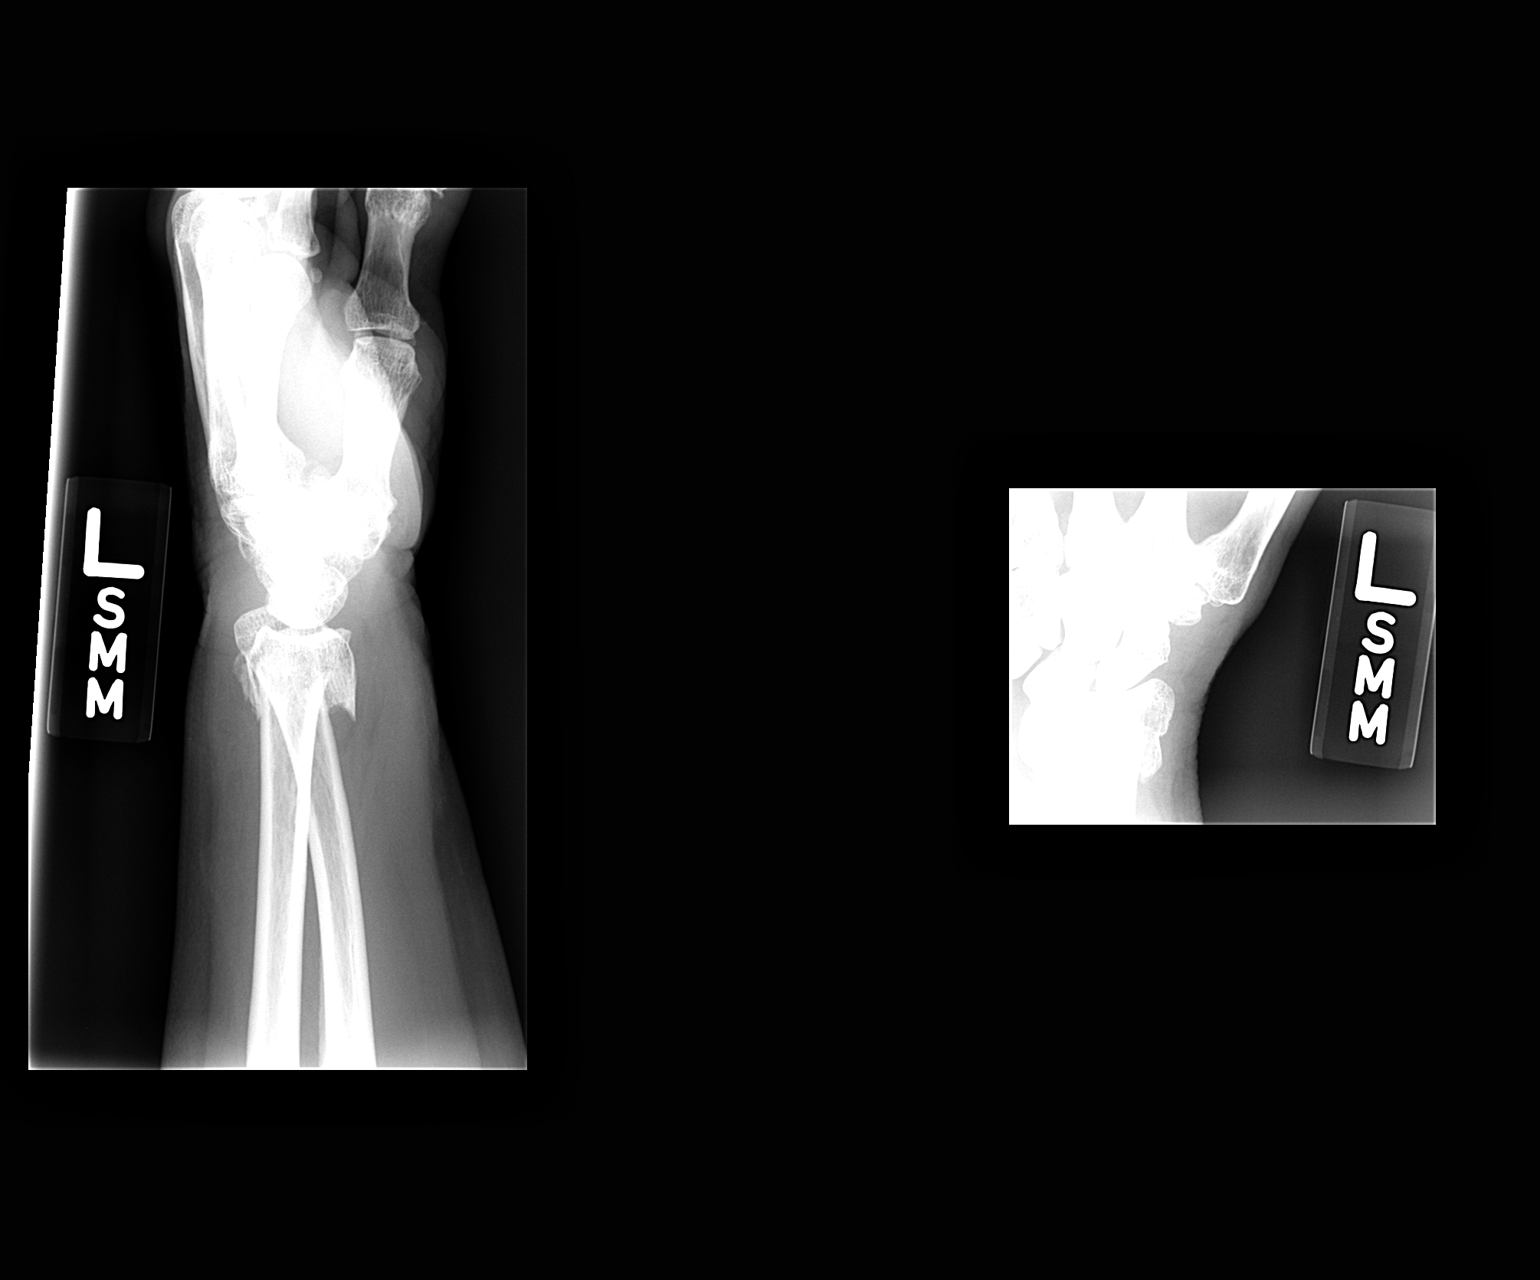

[2 of 2 positions shown; findings below may reference images not displayed]

FINDINGS: There is a transverse impacted distal radius fracture.
There is intra-articular extension of the radiocarpal joint along
the ulnar aspect.  Volar displacement of the distal radius is 5 mm.
Loss of the normal volar tilt.

Distal ulna appears intact.  There is widening of the distal radial
ulnar joint.  Scaphoid bone appears within normal limits.
Scapholunate interval intact.
IMPRESSION: Transverse impacted mildly displaced distal radius fracture with
intra-articular extension to the radiocarpal joint.

## 2014-02-10 ENCOUNTER — Ambulatory Visit (INDEPENDENT_AMBULATORY_CARE_PROVIDER_SITE_OTHER): Payer: BC Managed Care – PPO | Admitting: Certified Nurse Midwife

## 2014-02-10 ENCOUNTER — Encounter: Payer: Self-pay | Admitting: Certified Nurse Midwife

## 2014-02-10 VITALS — BP 102/60 | HR 72 | Temp 98.8°F | Resp 16 | Wt 128.4 lb

## 2014-02-10 DIAGNOSIS — N390 Urinary tract infection, site not specified: Secondary | ICD-10-CM

## 2014-02-10 MED ORDER — CIPROFLOXACIN HCL 500 MG PO TABS: 500.0000 mg | ORAL_TABLET | Freq: Two times a day (BID) | ORAL | Status: DC

## 2014-02-10 MED ORDER — PHENAZOPYRIDINE HCL 100 MG PO TABS: 100.0000 mg | ORAL_TABLET | Freq: Three times a day (TID) | ORAL | Status: DC

## 2014-02-10 NOTE — Patient Instructions (Signed)
Urinary Tract Infection  Urinary tract infections (UTIs) can develop anywhere along your urinary tract. Your urinary tract is your body's drainage system for removing wastes and extra water. Your urinary tract includes two kidneys, two ureters, a bladder, and a urethra. Your kidneys are a pair of bean-shaped organs. Each kidney is about the size of your fist. They are located below your ribs, one on each side of your spine.  CAUSES  Infections are caused by microbes, which are microscopic organisms, including fungi, viruses, and bacteria. These organisms are so small that they can only be seen through a microscope. Bacteria are the microbes that most commonly cause UTIs.  SYMPTOMS   Symptoms of UTIs may vary by age and gender of the patient and by the location of the infection. Symptoms in young women typically include a frequent and intense urge to urinate and a painful, burning feeling in the bladder or urethra during urination. Older women and men are more likely to be tired, shaky, and weak and have muscle aches and abdominal pain. A fever may mean the infection is in your kidneys. Other symptoms of a kidney infection include pain in your back or sides below the ribs, nausea, and vomiting.  DIAGNOSIS  To diagnose a UTI, your caregiver will ask you about your symptoms. Your caregiver also will ask to provide a urine sample. The urine sample will be tested for bacteria and white blood cells. White blood cells are made by your body to help fight infection.  TREATMENT   Typically, UTIs can be treated with medication. Because most UTIs are caused by a bacterial infection, they usually can be treated with the use of antibiotics. The choice of antibiotic and length of treatment depend on your symptoms and the type of bacteria causing your infection.  HOME CARE INSTRUCTIONS   If you were prescribed antibiotics, take them exactly as your caregiver instructs you. Finish the medication even if you feel better after you  have only taken some of the medication.   Drink enough water and fluids to keep your urine clear or pale yellow.   Avoid caffeine, tea, and carbonated beverages. They tend to irritate your bladder.   Empty your bladder often. Avoid holding urine for long periods of time.   Empty your bladder before and after sexual intercourse.   After a bowel movement, women should cleanse from front to back. Use each tissue only once.  SEEK MEDICAL CARE IF:    You have back pain.   You develop a fever.   Your symptoms do not begin to resolve within 3 days.  SEEK IMMEDIATE MEDICAL CARE IF:    You have severe back pain or lower abdominal pain.   You develop chills.   You have nausea or vomiting.   You have continued burning or discomfort with urination.  MAKE SURE YOU:    Understand these instructions.   Will watch your condition.   Will get help right away if you are not doing well or get worse.  Document Released: 06/26/2005 Document Revised: 03/17/2012 Document Reviewed: 10/25/2011  [employer] Patient Information 2014 [employer].

## 2014-02-10 NOTE — Progress Notes (Signed)
63 y.o.married white female g2p2002 here with complaint of UTI, with onset  on 3 days ago. Patient complaining of urinary frequency/urgency/ and pain with urination. Patient denies fever, chills, nausea or back pain. No new personal products. Patient feels not to sexual activity. Denies any vaginal symptoms or new personal products. Menopausal with vaginal dryness using Premarin cream.   O: Healthy female WDWN Affect: Normal, orientation x 3 Skin : warm and dry CVAT: negative bilateral Abdomen: positive for suprapubic tenderness  Pelvic exam: External genital area: normal, no lesions Bladder,Urethra, Urethral meatus: tender Vagina: normal vaginal discharge, atrophic appearance   Wet prep not taken Cervix: normal, non tender Uterus:normal,non tender Adnexa: normal non tender, no fullness or masses   A: UTI Poct urine-wbc 2+, rbc-tr, Nitrite-Pos P: Reviewed findings of UTI EX:BMWUX see order with instructions  Rx Pyridium see order LKG:MWNUU micro, culture Reviewed warning signs and symptoms of UTI Encouraged to limit soda, tea, and coffee RV 2 week if TOC needed for positive culture  RV prn

## 2014-02-11 LAB — URINALYSIS, MICROSCOPIC ONLY
CASTS: NONE SEEN
CRYSTALS: NONE SEEN

## 2014-02-12 LAB — URINE CULTURE: Colony Count: >=100000

## 2014-02-13 ENCOUNTER — Other Ambulatory Visit: Payer: Self-pay | Admitting: Certified Nurse Midwife

## 2014-02-13 DIAGNOSIS — N390 Urinary tract infection, site not specified: Secondary | ICD-10-CM

## 2014-02-14 NOTE — Progress Notes (Signed)
Reviewed personally.  M. Suzanne Talina Pleitez, MD.  

## 2014-02-25 ENCOUNTER — Ambulatory Visit (INDEPENDENT_AMBULATORY_CARE_PROVIDER_SITE_OTHER): Payer: BC Managed Care – PPO | Admitting: *Deleted

## 2014-02-25 VITALS — BP 130/66 | HR 80 | Resp 18 | Ht 59.0 in | Wt 129.0 lb

## 2014-02-25 DIAGNOSIS — N390 Urinary tract infection, site not specified: Secondary | ICD-10-CM

## 2014-02-25 NOTE — Progress Notes (Signed)
Patient in today for TOC. Patient stated she finished abx and denies any Sx. Advised pt to call us if any concerns or if the sx come back. Pt agreed.

## 2014-02-26 LAB — URINALYSIS, MICROSCOPIC ONLY
BACTERIA UA: NONE SEEN
Casts: NONE SEEN
Crystals: NONE SEEN

## 2014-02-28 LAB — URINE CULTURE

## 2014-03-01 ENCOUNTER — Telehealth: Payer: Self-pay

## 2014-03-01 NOTE — Telephone Encounter (Signed)
lmtcb

## 2014-03-01 NOTE — Telephone Encounter (Signed)
Message copied by Susy Manor on Tue Mar 01, 2014  5:16 PM ------      Message from: Regina Eck      Created: Mon Feb 28, 2014  6:06 PM       Urine culture shows no predominant bacteria and urine micro negative, please notify patient, no further treatment needed ------

## 2014-03-03 NOTE — Telephone Encounter (Signed)
Left message for call back.

## 2014-03-03 NOTE — Telephone Encounter (Signed)
Patient notified of results. See lab 

## 2014-05-06 ENCOUNTER — Other Ambulatory Visit: Payer: Self-pay

## 2014-05-06 MED ORDER — ALENDRONATE SODIUM 70 MG PO TABS: 70.0000 mg | ORAL_TABLET | ORAL | Status: DC

## 2014-05-06 NOTE — Telephone Encounter (Signed)
Last AEX: 05/14/13 Last refill:05/14/13 #12, 3ref Current AEX:05/16/14  Please advise

## 2014-05-06 NOTE — Telephone Encounter (Signed)
RX faxed to [facility] 

## 2014-05-16 ENCOUNTER — Encounter: Payer: Self-pay | Admitting: Obstetrics and Gynecology

## 2014-05-16 ENCOUNTER — Ambulatory Visit (INDEPENDENT_AMBULATORY_CARE_PROVIDER_SITE_OTHER): Payer: BC Managed Care – PPO | Admitting: Obstetrics and Gynecology

## 2014-05-16 VITALS — BP 120/76 | HR 80 | Resp 12 | Ht 59.0 in | Wt 127.6 lb

## 2014-05-16 DIAGNOSIS — Z0000 Encounter for general adult medical examination without abnormal findings: Secondary | ICD-10-CM

## 2014-05-16 DIAGNOSIS — M899 Disorder of bone, unspecified: Secondary | ICD-10-CM

## 2014-05-16 DIAGNOSIS — M8580 Other specified disorders of bone density and structure, unspecified site: Secondary | ICD-10-CM

## 2014-05-16 DIAGNOSIS — Z01419 Encounter for gynecological examination (general) (routine) without abnormal findings: Secondary | ICD-10-CM

## 2014-05-16 DIAGNOSIS — R8281 Pyuria: Secondary | ICD-10-CM

## 2014-05-16 DIAGNOSIS — R82998 Other abnormal findings in urine: Secondary | ICD-10-CM

## 2014-05-16 DIAGNOSIS — M949 Disorder of cartilage, unspecified: Secondary | ICD-10-CM

## 2014-05-16 LAB — HEMOGLOBIN, FINGERSTICK: HEMOGLOBIN, FINGERSTICK: 12.3 g/dL (ref 12.0–16.0)

## 2014-05-16 LAB — CBC
HCT: 37.6 % (ref 36.0–46.0)
Hemoglobin: 12.6 g/dL (ref 12.0–15.0)
MCH: 32.3 pg (ref 26.0–34.0)
MCHC: 33.5 g/dL (ref 30.0–36.0)
MCV: 96.4 fL (ref 78.0–100.0)
PLATELETS: 252 10*3/uL (ref 150–400)
RBC: 3.9 MIL/uL (ref 3.87–5.11)
RDW: 14.2 % (ref 11.5–15.5)
WBC: 4.6 10*3/uL (ref 4.0–10.5)

## 2014-05-16 LAB — COMPREHENSIVE METABOLIC PANEL
ALBUMIN: 3.9 g/dL (ref 3.5–5.2)
ALK PHOS: 53 U/L (ref 39–117)
ALT: 17 U/L (ref 0–35)
AST: 21 U/L (ref 0–37)
BUN: 16 mg/dL (ref 6–23)
CALCIUM: 9 mg/dL (ref 8.4–10.5)
CHLORIDE: 105 meq/L (ref 96–112)
CO2: 28 mEq/L (ref 19–32)
Creat: 1.03 mg/dL (ref 0.50–1.10)
GLUCOSE: 85 mg/dL (ref 70–99)
POTASSIUM: 4.3 meq/L (ref 3.5–5.3)
SODIUM: 140 meq/L (ref 135–145)
TOTAL PROTEIN: 6.5 g/dL (ref 6.0–8.3)
Total Bilirubin: 0.6 mg/dL (ref 0.2–1.2)

## 2014-05-16 LAB — LIPID PANEL
CHOL/HDL RATIO: 3 ratio
CHOLESTEROL: 191 mg/dL (ref 0–200)
HDL: 64 mg/dL (ref >39)
LDL Cholesterol: 114 mg/dL — ABNORMAL HIGH (ref 0–99)
Triglycerides: 65 mg/dL (ref <150)
VLDL: 13 mg/dL (ref 0–40)

## 2014-05-16 LAB — TSH: TSH: 2.109 u[IU]/mL (ref 0.350–4.500)

## 2014-05-16 MED ORDER — ALENDRONATE SODIUM 70 MG PO TABS: 70.0000 mg | ORAL_TABLET | ORAL | Status: DC

## 2014-05-16 NOTE — Patient Instructions (Signed)

## 2014-05-16 NOTE — Progress Notes (Signed)
Patient ID: Jessica Casey, female   DOB: 05/13/1951, 63 y.o.   MRN: [id number] GYNECOLOGY VISIT  PCP:   Unice Cobble, MD  Referring provider:   HPI: 63 y.o.   Married  Caucasian  female   G2P2002 with No LMP recorded. Patient is postmenopausal.   here for  AEX.  Patient has a history of Evista (10 years) and tamoxifen (4 years) use for breast cancer prevention.  Family history of breast cancer in patient's sister and mother - both were estrogen receptor positive.  Sister tested negative for BRCAs.  Has declined estrogen therapy.  Vaginal dryness is not as much of a problem.   On Fosamax for osteopenia for _3_ years.  Last bone density in Sept. 2014.   Hoping to stop working and sell home and move closer to her daughter who has a special needs child. Enjoys going to [employer].   Hgb:     12.3 Urine:   2+WBC's - some back pain, chronic and improved with a new mattress topper.   GYNECOLOGIC HISTORY: No LMP recorded. Patient is postmenopausal. Sexually active:  yes Partner preference: female Contraception:   postmenopausal Menopausal hormone therapy:  DES exposure:  no  Blood transfusions: no   Sexually transmitted diseases:   no GYN procedures and prior surgeries:  none Last mammogram:  04-20-14 NGE:XBMWU               Last pap and high risk HPV testing:   05-14-13 wnl:neg HR HPV History of abnormal pap smear:  no   OB History   Grav Para Term Preterm Abortions TAB SAB Ect Mult Living   2 2 2       2        LIFESTYLE: Exercise:    no            OTHER HEALTH MAINTENANCE: Tetanus/TDap:  Up to date with PCP HPV:                  n/a Influenza:           06/2013   Bone density:     06-07-13 hx of Osteoporosis:[facility] Colonoscopy:    05/2012 hx of polyps with Dr. Deatra Ina at [place].  Next colonosocopy due 05/2017.  Cholesterol check: wnl through PCP  Family History  Problem Relation Age of Onset  . Diabetes Father   . Coronary artery disease Father     S/P CBAG  , no MI  . Depression Father   . Diverticulosis Father   . Breast cancer Mother   . Stroke Mother 26  . Breast cancer Sister   . Depression Sister   . Depression Sister   . Depression Sister   . Depression Sister   . Depression Brother   . Bipolar disorder Other     Neice  . Colon polyps Sister   . Colon cancer Neg Hx     Patient Active Problem List   Diagnosis Date Noted  . Sinusitis, acute 09/10/2013  . Anemia 02/17/2013  . Hypoalbuminemia 02/17/2013  . Unspecified vitamin D deficiency 02/17/2013  . DEPRESSION 08/02/2009  . Osteopenia 08/02/2009  . FATIGUE 08/02/2009  . COLONIC POLYPS, HX OF 08/02/2009  . DEGENERATIVE DISC DISEASE 10/27/2008  . CERVICAL RADICULOPATHY, LEFT 10/27/2008   Past Medical History  Diagnosis Date  . DDD (degenerative disc disease)   . Depression   . Osteoporosis   . Colonic polyp 2005 & 2013  . Heart murmur  MVP- echo- 2010, no symptoms   . H/O echocardiogram     before 2000, no need for f/u /w cardiac   . Mononucleosis 1971  . Pinched nerve in neck     Past Surgical History  Procedure Laterality Date  . Umbilical hernia repair    . Cervical fusion      2 proceduresr Vertell Limber  . Lumbar disc surgery      Dr.Stern  . Rotator cuff repair      Dr.Wainer  . Colonoscopy with polypectomy  2013    Dr Deatra Ina  . Knee surgery      Dr Wonda Olds ; bursa cystectomy  . Carpal tunnel release Right   . Cataract extraction, bilateral  2013    Dr Gershon Crane, Viona Gilmore IOL  . Hernia repair    . Fracture surgery      L wrist - June 2014  . Wrist fracture surgery Left 02/2013    Dr. Kathryne Hitch  . Anterior cervical decomp/discectomy fusion N/A 05/20/2013    Procedure: ANTERIOR CERVICAL DECOMPRESSION/DISCECTOMY FUSION 2 LEVELS;  Surgeon: Erline Levine, MD;  Location: [facility];  Service: Neurosurgery;  Laterality: N/A;  Cervical Seven-Thoracic One, Thoracic One-Two Anterior cervical decompression/Diskectomy/Fusion    ALLERGIES: Gabapentin  Current  Outpatient Prescriptions  Medication Sig Dispense Refill  . alendronate (FOSAMAX) 70 MG tablet Take 1 tablet (70 mg total) by mouth every Sunday. Take with a full glass of water on an empty stomach.  12 tablet  0  . aspirin 81 MG tablet Take 81 mg by mouth daily.        . Biotin 300 MCG TABS Take 300 mcg by mouth daily.        Marland Kitchen buPROPion (WELLBUTRIN XL) 300 MG 24 hr tablet Take 300 mg by mouth daily.      . Cholecalciferol (VITAMIN D) 2000 UNITS CAPS Take by mouth.      . escitalopram (LEXAPRO) 20 MG tablet Take 20 mg by mouth daily.      . Omega-3 Fatty Acids (FISH OIL PO) Take by mouth.       No current facility-administered medications for this visit.     ROS:  Pertinent items are noted in HPI.  History   Social History  . Marital Status: Married    Spouse Name: N/A    Number of Children: N/A  . Years of Education: N/A   Occupational History  . Teacher    Social History Main Topics  . Smoking status: Never Smoker   . Smokeless tobacco: Not on file  . Alcohol Use: Yes     Comment:  very rarely  . Drug Use: No  . Sexual Activity: Yes    Partners: Male    Birth Control/ Protection: Post-menopausal   Other Topics Concern  . Not on file   Social History Narrative  . No narrative on file    PHYSICAL EXAMINATION:    BP 120/76  Pulse 80  Resp 12  Ht 4\' 11"  (1.499 m)  Wt 127 lb 9.6 oz (57.879 kg)  BMI 25.76 kg/m2   Wt Readings from Last 3 Encounters:  05/16/14 127 lb 9.6 oz (57.879 kg)  02/25/14 129 lb (58.514 kg)  02/10/14 128 lb 6.4 oz (58.242 kg)     Ht Readings from Last 3 Encounters:  05/16/14 4\' 11"  (1.499 m)  02/25/14 4\' 11"  (1.499 m)  12/08/13 4\' 11"  (1.499 m)    General appearance: alert, cooperative and appears stated age Head: Normocephalic, without  obvious abnormality, atraumatic Neck: no adenopathy, supple, symmetrical, trachea midline and thyroid not enlarged, symmetric, no tenderness/mass/nodules Lungs: clear to auscultation  bilaterally Breasts: Inspection negative, No nipple retraction or dimpling, No nipple discharge or bleeding, No axillary or supraclavicular adenopathy, Normal to palpation without dominant masses Heart: regular rate and rhythm Abdomen: soft, non-tender; no masses,  no organomegaly Extremities: extremities normal, atraumatic, no cyanosis or edema Skin: Skin color, texture, turgor normal. No rashes or lesions Lymph nodes: Cervical, supraclavicular, and axillary nodes normal. No abnormal inguinal nodes palpated Neurologic: Grossly normal  Pelvic: External genitalia:  no lesions              Urethra:  normal appearing urethra with no masses, tenderness or lesions              Bartholins and Skenes: normal                 Vagina: normal appearing vagina with normal color and discharge, no lesions              Cervix: normal appearance              Pap and high risk HPV testing done: No.        Bimanual Exam:  Uterus:  uterus is normal size, shape, consistency and nontender                                      Adnexa: normal adnexa in size, nontender and no masses                                      Rectovaginal:  Yes.                                        Confirms above.                                      Anus:  normal sphincter tone, no lesions, stool in the vault.  ASSESSMENT  Normal gynecologic exam. Osteopenia.  Pyuria.  Two UTIs in the last year.  PLAN  Mammogram recommended yearly starting at age 80. Pap smear and high risk HPV testing as above. Counseled on self breast exam, Calcium and vitamin D intake, weight bearing exercise. See lab orders: Yes.   Urine micro and culture also sent.  Will consider referral to urology.  Continue Fosamax weekly.  See orders.  Return annually or prn   An After Visit Summary was printed and given to the patient.

## 2014-05-17 LAB — VITAMIN D 25 HYDROXY (VIT D DEFICIENCY, FRACTURES): VIT D 25 HYDROXY: 61 ng/mL (ref 30–89)

## 2014-05-17 LAB — URINALYSIS, MICROSCOPIC ONLY
Bacteria, UA: NONE SEEN
Casts: NONE SEEN
Crystals: NONE SEEN

## 2014-05-18 ENCOUNTER — Other Ambulatory Visit: Payer: Self-pay | Admitting: Obstetrics and Gynecology

## 2014-05-18 DIAGNOSIS — Z87440 Personal history of urinary (tract) infections: Secondary | ICD-10-CM

## 2014-05-18 LAB — URINE CULTURE: Colony Count: >=100000

## 2014-05-18 MED ORDER — AMOXICILLIN 500 MG PO CAPS: 500.0000 mg | ORAL_CAPSULE | Freq: Three times a day (TID) | ORAL | Status: DC

## 2014-05-25 ENCOUNTER — Telehealth: Payer: Self-pay | Admitting: Obstetrics and Gynecology

## 2014-05-25 NOTE — Telephone Encounter (Signed)
Patient has an upcoming appointment at [facility] for recurrent uti's. Jessica Casey from that office is requesting any records related to this problem be faxed as soon as possible.

## 2014-05-30 ENCOUNTER — Ambulatory Visit (INDEPENDENT_AMBULATORY_CARE_PROVIDER_SITE_OTHER): Payer: BC Managed Care – PPO | Admitting: *Deleted

## 2014-05-30 VITALS — BP 126/62 | HR 72 | Ht 59.0 in | Wt 128.0 lb

## 2014-05-30 DIAGNOSIS — N390 Urinary tract infection, site not specified: Secondary | ICD-10-CM

## 2014-05-30 NOTE — Progress Notes (Signed)
Patient present today for TOC. Patient states she finished abx and denies any sx. Advise pt to call if any sx arise or any questions. - pt agreed.

## 2014-05-31 LAB — URINE CULTURE

## 2014-06-02 ENCOUNTER — Telehealth: Payer: Self-pay

## 2014-06-02 NOTE — Telephone Encounter (Signed)
Rtd. To Olivia Mackie

## 2014-06-02 NOTE — Telephone Encounter (Signed)
Message copied by Lowella Fairy on Thu Jun 02, 2014 11:02 AM ------      Message from: [place], BROOK E      Created: Tue May 31, 2014  7:39 PM       Urine result to patient through My Chart.       Please call her to see if she is feeling better or not after her course of antibiotics.       This is a test of cure and it shows a lot of bacteria, just no prominent organism.             I would like to have her see someone from [facility] regarding recurrent urinary tract infections.       Please ask her if she has a preference for a provider.       We talked about this referral, but I do not believe it has happened yet. ------

## 2014-06-02 NOTE — Telephone Encounter (Signed)
Patient has appointment with Dr. Matilde Sprang on 06/08/14 at 0815.  Patient is aware of this appointment per [facility] reception as she has rescheduled from her initial referral appointment.   Message left to return call to [facility] at [phone] to discuss non urgent message from Dr. Quincy Simmonds or can speak with Estill Bamberg as well.

## 2014-06-02 NOTE — Telephone Encounter (Signed)
Spoke with patient. She denies any complaints. States she completed treatment without issue. She is aware of urology appointment for next week and will keep as is.  Routing to provider for final review. Patient agreeable to disposition. Will close encounter

## 2014-07-28 ENCOUNTER — Other Ambulatory Visit: Payer: Self-pay | Admitting: Certified Nurse Midwife

## 2014-07-28 NOTE — Telephone Encounter (Signed)
Incoming Refill Request from [facility]: Fosamax 70 mg  Last AEX: 05/16/14 Last Refill:05/16/14 #12 X 3 Next AEX:06/21/15  Pt was given a new rx on 05/16/14 #12 X 3.  Refused

## 2014-08-01 ENCOUNTER — Encounter: Payer: Self-pay | Admitting: Obstetrics and Gynecology

## 2014-12-18 ENCOUNTER — Encounter: Payer: Self-pay | Admitting: Obstetrics and Gynecology

## 2014-12-18 MED ORDER — ALENDRONATE SODIUM 70 MG PO TABS: 70.0000 mg | ORAL_TABLET | ORAL | Status: DC

## 2014-12-30 ENCOUNTER — Telehealth: Payer: Self-pay | Admitting: Obstetrics and Gynecology

## 2014-12-30 MED ORDER — ALENDRONATE SODIUM 70 MG PO TABS: 70.0000 mg | ORAL_TABLET | ORAL | Status: DC

## 2014-12-30 NOTE — Telephone Encounter (Signed)
Medication refill request: Fosamax  Last AEX:  05/16/14 Dr. Quincy Simmonds Next AEX: 06/21/15 Dr. Quincy Simmonds Last MMG (if hormonal medication request): 04/20/14 [id number]:neg Refill authorized: Fosamax #[id number] printed 12/18/14   Rx sent electronically as prescribed by Dr. Sabra Heck 12/18/14 #4/1R.    Routed to Dr. Sabra Heck for review. - LM for pt re: Rx sent to pharmacy.  -Encounter closed

## 2014-12-30 NOTE — Telephone Encounter (Signed)
Pt checking on prescription for fosamax that should have been called into the [facility] on [address] at [phone].

## 2015-01-13 ENCOUNTER — Telehealth: Payer: Self-pay | Admitting: Obstetrics and Gynecology

## 2015-01-13 MED ORDER — CIPROFLOXACIN HCL 500 MG PO TABS: 500.0000 mg | ORAL_TABLET | Freq: Two times a day (BID) | ORAL | Status: DC

## 2015-01-13 NOTE — Telephone Encounter (Signed)
Pt states she has a uti. Pt unable to come in today due to childcare (she keeps her granddaughter)  Pt requests treatment suggestions

## 2015-01-13 NOTE — Telephone Encounter (Signed)
I will prescribe an antibiotic for her.  She has a grandchild with special needs and I understand that it may be difficult for her to come in for a visit.   Rx for Cipro 500 mg po bid for one week.   I would like to have her come in for an appointment in 10 days for a recheck and a test of cure.  I want to review with her options for reduction of infections through periodic abx with sexual activity or just a prolonged 3 month course, whichever may be appropriate.   Thanks!

## 2015-01-13 NOTE — Telephone Encounter (Signed)
Spoke with patient. Advised of message as seen below from [place]. Patient is agreeable and verbalizes understanding. Rx for Cipro 500mg  po bid #[id number] [phone] sent to [facility] on file. Patient is agreeable. Follow up appointment with Dr.Silva scheduled for 4/29 at 9:45am. Patient is agreeable to date and time.  Routing to provider for final review. Patient agreeable to disposition. Will close encounter

## 2015-01-13 NOTE — Telephone Encounter (Signed)
Spoke with patient. Patient states "I get UTI's all the time. I am having the same symptoms. I went to a urologist a while ago since I was having them so often and he told me that everything was okay." Patient is currently experiencing urinary urgency with little output and burning with urination. Denies any back pain, fever, and chills. "I have taken pyridium today that I had left over for the pain today. I just do not have an antibiotic." Advised will need to be seen in office for further evaluation with Dr.Silva. Patient decline and appointment today due to having her granddaughter for the afternoon. Advised will speak with Dr.Silva and return call with further recommendations. Patient is agreeable.

## 2015-01-27 ENCOUNTER — Encounter: Payer: Self-pay | Admitting: Obstetrics and Gynecology

## 2015-01-27 ENCOUNTER — Ambulatory Visit (INDEPENDENT_AMBULATORY_CARE_PROVIDER_SITE_OTHER): Payer: BC Managed Care – PPO | Admitting: Obstetrics and Gynecology

## 2015-01-27 VITALS — BP 120/72 | HR 66 | Ht 59.0 in | Wt 127.8 lb

## 2015-01-27 DIAGNOSIS — R82998 Other abnormal findings in urine: Secondary | ICD-10-CM

## 2015-01-27 DIAGNOSIS — N390 Urinary tract infection, site not specified: Secondary | ICD-10-CM | POA: Diagnosis not present

## 2015-01-27 DIAGNOSIS — R3915 Urgency of urination: Secondary | ICD-10-CM

## 2015-01-27 LAB — POCT URINALYSIS DIPSTICK
Bilirubin, UA: NEGATIVE
Glucose, UA: NEGATIVE
KETONES UA: NEGATIVE
NITRITE UA: NEGATIVE
PH UA: 5
Protein, UA: NEGATIVE
RBC UA: NEGATIVE
UROBILINOGEN UA: NEGATIVE

## 2015-01-27 MED ORDER — ALENDRONATE SODIUM 70 MG PO TABS: 70.0000 mg | ORAL_TABLET | ORAL | Status: DC

## 2015-01-27 NOTE — Progress Notes (Signed)
Patient ID: Jessica Casey, female   DOB: 1950-10-26, 64 y.o.   MRN: [id number] GYNECOLOGY  VISIT   HPI: 64 y.o.   Married  Caucasian  female   G2P2002 with Patient's last menstrual period was 10/01/1995 (approximate).   here for 2 week follow up to urinary tract infection.  Patient states feeling much better and has completed Cipro.    Did not have urine culture prior to treatment as she was not able to come to the office for a visit.   Symptom of UTI is urgency more than real pain when these occur. Saw urology and had a normal renal ultrasound and cystoscopy.    Offered a low dose of antibiotic but patient has declined this to date.  Thinks infections are not postcoital.   Five urine cultures since January 2015.  Organisms have included Enterobacter, E Coli, and Strep. Some cultures have been negative, but patient thinks that she may have come in an early stage of infection.  States abx always take away her symptoms.   Thinking of selling their home and moving closer to their daughter in [place].  Have a grandson with special needs and want to be closer to them.   Urine dip - 1+ WBCs, no symptoms.  GYNECOLOGIC HISTORY: Patient's last menstrual period was 10/01/1995 (approximate). Contraception:  postmenopausal  Menopausal hormone therapy: none Last Pap:  05-14-13 wnl:neg HR HPV Last Mammogram:  04-20-14 normal:[facility]  Urine Dip: 1+ WBCs        OB History    Gravida Para Term Preterm AB TAB SAB Ectopic Multiple Living   2 2 2       2          Patient Active Problem List   Diagnosis Date Noted  . Sinusitis, acute 09/10/2013  . Anemia 02/17/2013  . Hypoalbuminemia 02/17/2013  . Unspecified vitamin D deficiency 02/17/2013  . DEPRESSION 08/02/2009  . Osteopenia 08/02/2009  . FATIGUE 08/02/2009  . COLONIC POLYPS, HX OF 08/02/2009  . DEGENERATIVE DISC DISEASE 10/27/2008  . CERVICAL RADICULOPATHY, LEFT 10/27/2008    Past Medical History  Diagnosis Date  . DDD  (degenerative disc disease)   . Depression   . Osteoporosis   . Colonic polyp 2005 & 2013  . Heart murmur     MVP- echo- 2010, no symptoms   . H/O echocardiogram     before 2000, no need for f/u /w cardiac   . Mononucleosis 1971  . Pinched nerve in neck     Past Surgical History  Procedure Laterality Date  . Umbilical hernia repair    . Cervical fusion      2 proceduresr Vertell Limber  . Lumbar disc surgery      Dr.Stern  . Rotator cuff repair      Dr.Wainer  . Colonoscopy with polypectomy  2013    Dr Deatra Ina  . Knee surgery      Dr Wonda Olds ; bursa cystectomy  . Carpal tunnel release Right   . Cataract extraction, bilateral  2013    Dr Gershon Crane, Viona Gilmore IOL  . Hernia repair    . Fracture surgery      L wrist - June 2014  . Wrist fracture surgery Left 02/2013    Dr. Kathryne Hitch  . Anterior cervical decomp/discectomy fusion N/A 05/20/2013    Procedure: ANTERIOR CERVICAL DECOMPRESSION/DISCECTOMY FUSION 2 LEVELS;  Surgeon: Erline Levine, MD;  Location: [facility];  Service: Neurosurgery;  Laterality: N/A;  Cervical Seven-Thoracic One, Thoracic One-Two Anterior cervical  decompression/Diskectomy/Fusion    Current Outpatient Prescriptions  Medication Sig Dispense Refill  . alendronate (FOSAMAX) 70 MG tablet Take 1 tablet (70 mg total) by mouth every Sunday. Take with a full glass of water on an empty stomach. 4 tablet 1  . aspirin 81 MG tablet Take 81 mg by mouth daily.      . Biotin 300 MCG TABS Take 300 mcg by mouth daily.      Marland Kitchen buPROPion (WELLBUTRIN XL) 300 MG 24 hr tablet Take 300 mg by mouth daily.    . Cholecalciferol (VITAMIN D) 2000 UNITS CAPS Take by mouth.    . escitalopram (LEXAPRO) 20 MG tablet Take 20 mg by mouth daily.    . Omega-3 Fatty Acids (FISH OIL PO) Take by mouth.     No current facility-administered medications for this visit.     ALLERGIES: Gabapentin  Family History  Problem Relation Age of Onset  . Diabetes Father   . Coronary artery disease Father      S/P CBAG , no MI  . Depression Father   . Diverticulosis Father   . Breast cancer Mother   . Stroke Mother 4  . Breast cancer Sister   . Depression Sister   . Depression Sister   . Depression Sister   . Depression Sister   . Depression Brother   . Bipolar disorder Other     Neice  . Colon polyps Sister   . Colon cancer Neg Hx     History   Social History  . Marital Status: Married    Spouse Name: N/A  . Number of Children: N/A  . Years of Education: N/A   Occupational History  . Teacher    Social History Main Topics  . Smoking status: Never Smoker   . Smokeless tobacco: Not on file  . Alcohol Use: 0.0 oz/week    0 Standard drinks or equivalent per week     Comment:  very rarely  . Drug Use: No  . Sexual Activity:    Partners: Male    Birth Control/ Protection: Post-menopausal   Other Topics Concern  . Not on file   Social History Narrative    ROS:  Pertinent items are noted in HPI.  PHYSICAL EXAMINATION:    BP 120/72 mmHg  Pulse 66  Ht 4\' 11"  (1.499 m)  Wt 127 lb 12.8 oz (57.97 kg)  BMI 25.80 kg/m2  LMP 10/01/1995 (Approximate)     General appearance: alert, cooperative and appears stated age.       ASSESSMENT  Recurrent UTIs. Negative urology work up.  Status post Cipro course. WBCs in urine today at recheck.   PLAN  UC sent today.   Discussed risks and benefits of abx suppression.  Would like to proceed with Nitrofurantoin for 3 months.  See orders. Recheck at annual exam.    An After Visit Summary was printed and given to the patient.  __15____ minutes face to face time of which over 50% was spent in counseling.

## 2015-01-28 ENCOUNTER — Encounter: Payer: Self-pay | Admitting: Obstetrics and Gynecology

## 2015-01-28 LAB — URINE CULTURE

## 2015-01-28 MED ORDER — NITROFURANTOIN MACROCRYSTAL 50 MG PO CAPS: ORAL_CAPSULE | ORAL | Status: DC

## 2015-04-05 ENCOUNTER — Other Ambulatory Visit: Payer: Self-pay | Admitting: Obstetrics and Gynecology

## 2015-04-05 NOTE — Telephone Encounter (Signed)
Medication refill request: Macrodantin 50 Last AEX:  05/16/14 with BS  Next AEX: 06/21/15 with BS Last MMG (if hormonal medication request): 04/20/14 Bi-rads category 1 neg. Refill authorized: Please advise.

## 2015-04-22 DIAGNOSIS — Z1231 Encounter for screening mammogram for malignant neoplasm of breast: Secondary | ICD-10-CM | POA: Diagnosis not present

## 2015-05-22 ENCOUNTER — Other Ambulatory Visit: Payer: Self-pay | Admitting: Obstetrics and Gynecology

## 2015-05-22 MED ORDER — NITROFURANTOIN MACROCRYSTAL 50 MG PO CAPS: 50.0000 mg | ORAL_CAPSULE | Freq: Every day | ORAL | Status: DC

## 2015-05-22 NOTE — Telephone Encounter (Signed)
Medication refill request: Macrodantin 50 mg  Last AEX:  05/16/14 with BS Next AEX: 06/21/15 with BS  Last MMG (if hormonal medication request): n/a Refill authorized: #[id number]   Routed to Dr. Talbert Nan since Dr. Quincy Simmonds is in surgery

## 2015-06-07 ENCOUNTER — Other Ambulatory Visit: Payer: Self-pay | Admitting: Obstetrics and Gynecology

## 2015-06-07 NOTE — Telephone Encounter (Signed)
Medication refill request: Fosamax Last AEX:  05/14/13 with BS Next AEX: 06/21/15 with BS Last MMG (if hormonal medication request):  Refill authorized: please advise

## 2015-06-21 ENCOUNTER — Ambulatory Visit (INDEPENDENT_AMBULATORY_CARE_PROVIDER_SITE_OTHER): Payer: Medicare Other | Admitting: Obstetrics and Gynecology

## 2015-06-21 ENCOUNTER — Encounter: Payer: Self-pay | Admitting: Obstetrics and Gynecology

## 2015-06-21 VITALS — BP 118/64 | HR 66 | Resp 14 | Ht 58.5 in | Wt 129.0 lb

## 2015-06-21 DIAGNOSIS — Z23 Encounter for immunization: Secondary | ICD-10-CM | POA: Diagnosis not present

## 2015-06-21 DIAGNOSIS — Z124 Encounter for screening for malignant neoplasm of cervix: Secondary | ICD-10-CM | POA: Diagnosis not present

## 2015-06-21 DIAGNOSIS — F329 Major depressive disorder, single episode, unspecified: Secondary | ICD-10-CM | POA: Diagnosis not present

## 2015-06-21 DIAGNOSIS — F32A Depression, unspecified: Secondary | ICD-10-CM

## 2015-06-21 DIAGNOSIS — N390 Urinary tract infection, site not specified: Secondary | ICD-10-CM | POA: Diagnosis not present

## 2015-06-21 DIAGNOSIS — Z01419 Encounter for gynecological examination (general) (routine) without abnormal findings: Secondary | ICD-10-CM | POA: Diagnosis not present

## 2015-06-21 DIAGNOSIS — M810 Age-related osteoporosis without current pathological fracture: Secondary | ICD-10-CM

## 2015-06-21 MED ORDER — ALENDRONATE SODIUM 70 MG PO TABS: ORAL_TABLET | ORAL | Status: DC

## 2015-06-21 MED ORDER — ESTROGENS, CONJUGATED 0.625 MG/GM VA CREA: TOPICAL_CREAM | VAGINAL | Status: DC

## 2015-06-21 MED ORDER — BUPROPION HCL ER (XL) 300 MG PO TB24: 300.0000 mg | ORAL_TABLET | Freq: Every day | ORAL | Status: DC

## 2015-06-21 MED ORDER — ESCITALOPRAM OXALATE 20 MG PO TABS
20.0000 mg | ORAL_TABLET | Freq: Every day | ORAL | Status: DC
Start: 2015-06-21 — End: 2016-01-24

## 2015-06-21 NOTE — Patient Instructions (Signed)

## 2015-06-21 NOTE — Progress Notes (Signed)
Patient ID: Jessica Casey, female   DOB: 1951-04-20, 64 y.o.   MRN: [id number] 64 y.o. G9P2002 Married Caucasian female here for annual exam.    Hx of UTIs.  Status post Macrodantin for about 4 months.  Working well to prevent UTIs.  Sister had BRCA negative testing.  Mother and sister had estrogen receptor positive breast cancer.   Patient is on Wellbutrin and Lexapro and needs refills.  These dosages are working for treatment of depression.  Wants me to refill.  Does not need to see psychiatry now that these dosages are working well.  Previous Rx from PCP who requested psychiatry input to find the correct combination of medications.   Planning to sell home and move to be closer to daughter.  Has grandson with special needs.   PCP:  Unice Cobble, MD  Patient's last menstrual period was 10/01/1995 (approximate).          Sexually active: Yes.   female partner The current method of family planning is post menopausal status.    Exercising: No.   Smoker:  no  Health Maintenance: Pap:  05-14-13 Neg:Neg HR HPV History of abnormal Pap:  no MMG:  04-22-15 Density Cat.B/Neg:[facility] Colonoscopy:  06-25-12 polyps with Dr. Erskine Emery.  Next due 05/2017. BMD:   06-07-13   Result: Low bone mass--no change.  History of Osteoporosis with Fosamax treatment:[facility] TDaP:  Tetanus 06-09-07 Screening Labs:  Hb today: PCP, Urine today: PCP   reports that she has never smoked. She does not have any smokeless tobacco history on file. She reports that she does not drink alcohol or use illicit drugs.  Past Medical History  Diagnosis Date  . DDD (degenerative disc disease)   . Depression   . Osteoporosis   . Colonic polyp 2005 & 2013  . Heart murmur     MVP- echo- 2010, no symptoms   . H/O echocardiogram     before 2000, no need for f/u /w cardiac   . Mononucleosis 1971  . Pinched nerve in neck     Past Surgical History  Procedure Laterality Date  . Umbilical hernia repair    . Cervical fusion     2 proceduresr Vertell Limber  . Lumbar disc surgery      Dr.Stern  . Rotator cuff repair      Dr.Wainer  . Colonoscopy with polypectomy  2013    Dr Deatra Ina  . Knee surgery      Dr Wonda Olds ; bursa cystectomy  . Carpal tunnel release Right   . Cataract extraction, bilateral  2013    Dr Gershon Crane, Viona Gilmore IOL  . Hernia repair    . Fracture surgery      L wrist - June 2014  . Wrist fracture surgery Left 02/2013    Dr. Kathryne Hitch  . Anterior cervical decomp/discectomy fusion N/A 05/20/2013    Procedure: ANTERIOR CERVICAL DECOMPRESSION/DISCECTOMY FUSION 2 LEVELS;  Surgeon: Erline Levine, MD;  Location: [facility];  Service: Neurosurgery;  Laterality: N/A;  Cervical Seven-Thoracic One, Thoracic One-Two Anterior cervical decompression/Diskectomy/Fusion    Current Outpatient Prescriptions  Medication Sig Dispense Refill  . alendronate (FOSAMAX) 70 MG tablet TAKE 1 TABLET EVERY SUNDAY. TAKE WITH A FULL GLASS OF WATER ON AN EMPTY STOMACH 4 tablet 0  . aspirin 81 MG tablet Take 81 mg by mouth daily.      . Biotin 300 MCG TABS Take 300 mcg by mouth daily.      Marland Kitchen buPROPion (WELLBUTRIN XL) 300 MG  24 hr tablet Take 300 mg by mouth daily.    . Cholecalciferol (VITAMIN D) 2000 UNITS CAPS Take by mouth.    . escitalopram (LEXAPRO) 20 MG tablet Take 20 mg by mouth daily.    . nitrofurantoin (MACRODANTIN) 50 MG capsule Take 1 capsule (50 mg total) by mouth daily. 90 capsule 0  . Omega-3 Fatty Acids (FISH OIL PO) Take by mouth.     No current facility-administered medications for this visit.    Family History  Problem Relation Age of Onset  . Diabetes Father   . Coronary artery disease Father     S/P CBAG , no MI -- Dec  . Depression Father   . Diverticulosis Father   . Breast cancer Mother   . Stroke Mother 90    Dec  . Breast cancer Sister   . Depression Sister   . Depression Sister   . Depression Sister   . Depression Sister   . Depression Brother   . Bipolar disorder Other     Neice  . Colon  polyps Sister   . Colon cancer Neg Hx     ROS:  Pertinent items are noted in HPI.  Otherwise, a comprehensive ROS was negative.  Exam:   BP 118/64 mmHg  Pulse 66  Resp 14  Ht 4' 10.5" (1.486 m)  Wt 129 lb (58.514 kg)  BMI 26.50 kg/m2  LMP 10/01/1995 (Approximate)    General appearance: alert, cooperative and appears stated age Head: Normocephalic, without obvious abnormality, atraumatic Neck: no adenopathy, supple, symmetrical, trachea midline and thyroid normal to inspection and palpation Lungs: clear to auscultation bilaterally Breasts: normal appearance, no masses or tenderness, Inspection negative, No nipple retraction or dimpling, No nipple discharge or bleeding, No axillary or supraclavicular adenopathy Heart: regular rate and rhythm Abdomen: soft, non-tender; bowel sounds normal; no masses,  no organomegaly Extremities: extremities normal, atraumatic, no cyanosis or edema Skin: Skin color, texture, turgor normal. No rashes or lesions Lymph nodes: Cervical, supraclavicular, and axillary nodes normal. No abnormal inguinal nodes palpated Neurologic: Grossly normal  Pelvic: External genitalia:  no lesions              Urethra:  normal appearing urethra with no masses, tenderness or lesions              Bartholins and Skenes: normal                 Vagina: normal appearing vagina with normal color and discharge, no lesions              Cervix: no lesions              Pap taken: No. Bimanual Exam:  Uterus:  normal size, contour, position, consistency, mobility, non-tender              Adnexa: normal adnexa and no mass, fullness, tenderness              Rectovaginal: Yes.  .  Confirms.              Anus:  normal sphincter tone, no lesions  Chaperone was present for exam.  Assessment:   Well woman visit with normal exam. FH of breast cancer.  BRCA negative.  Hx UTIs. Osteoporosis.  Depression.   Plan: Yearly mammogram recommended after age 89.  Recommended self breast  exam.  Pap and HR HPV as above. Discussed Calcium, Vitamin D, regular exercise program including cardiovascular and weight bearing exercise. Labs performed.  No..   See orders. Refills given on medications.  Yes.  .  See orders.  Wellbutrin XL and Lexapro.  Fosamax.  See orders.  Discussed local vaginal estrogen to urethra for UTI prophylaxis.  Will use Premarin vaginal cream pea size amount to urethral twice weekly. Discussed risks of DVT, PE, MI, stroke, breast cancer.  These are very low dosages she will be using.  She would like to try to vaginal estrogen.  Patient will schedule bone density.  Order faxed to [facility]. Follow up annually and prn.      After visit summary provided.

## 2015-08-03 ENCOUNTER — Telehealth: Payer: Self-pay | Admitting: Obstetrics and Gynecology

## 2015-08-03 ENCOUNTER — Other Ambulatory Visit: Payer: Self-pay | Admitting: Obstetrics and Gynecology

## 2015-08-03 DIAGNOSIS — R300 Dysuria: Secondary | ICD-10-CM

## 2015-08-03 MED ORDER — SULFAMETHOXAZOLE-TRIMETHOPRIM 800-160 MG PO TABS: 1.0000 | ORAL_TABLET | Freq: Two times a day (BID) | ORAL | Status: DC

## 2015-08-03 NOTE — Telephone Encounter (Signed)
Please have patient drop off a clean catch specimen to lab in our building. I will place order for urine micro and culture.  I will send through an Rx for Bactrim DS, one, po bid for 7 days.  Please start after dropping off urine specimen.

## 2015-08-03 NOTE — Telephone Encounter (Signed)
Patient thinks she has another uti and would like to talk with Dr.Silva's nurse. Last seen 06/21/15.

## 2015-08-03 NOTE — Telephone Encounter (Signed)
Call to patient. She is agreeable to plan. Will go to [facility] her in building to drop off clean catch for Micro and culture.  Will start Bactrim DS after giving specimen.  Patient aware to call back with any concerning symptoms, fevers, back pain, chills, vomiting, or any concerns. Advised will call back with culture results.  Patient very thankful for message from Dr. Quincy Simmonds.  Routing to provider for final review. Patient agreeable to disposition. Will close encounter.

## 2015-08-03 NOTE — Telephone Encounter (Signed)
Call to patient. She states she has urinary frequency and dysuria for two days. No fevers, back pain or chills. She states she cannot come for office visit because she is taking care of her 65 year old grand daughter today and tomorrow. She states she discontinued daily macrobid in September and did not have any symptoms until two days ago. Patient states she can drop of urinalysis sample if necessary. Advised will send message to Dr. Quincy Simmonds and return call with response. Patient agreeable.

## 2015-08-04 DIAGNOSIS — R300 Dysuria: Secondary | ICD-10-CM | POA: Diagnosis not present

## 2015-08-04 NOTE — Addendum Note (Signed)
Addended by: Michele Mcalpine on: 08/04/2015 10:11 AM   Modules accepted: Orders

## 2015-08-05 LAB — URINALYSIS, MICROSCOPIC ONLY
CRYSTALS: NONE SEEN [HPF]
Casts: NONE SEEN [LPF]
RBC / HPF: NONE SEEN RBC/HPF (ref <=2)
YEAST: NONE SEEN [HPF]

## 2015-08-07 DIAGNOSIS — M8589 Other specified disorders of bone density and structure, multiple sites: Secondary | ICD-10-CM | POA: Diagnosis not present

## 2015-08-07 DIAGNOSIS — M810 Age-related osteoporosis without current pathological fracture: Secondary | ICD-10-CM | POA: Diagnosis not present

## 2015-08-07 LAB — URINE CULTURE

## 2015-08-08 ENCOUNTER — Ambulatory Visit (INDEPENDENT_AMBULATORY_CARE_PROVIDER_SITE_OTHER): Payer: Medicare Other | Admitting: Family

## 2015-08-08 ENCOUNTER — Encounter: Payer: Self-pay | Admitting: Family

## 2015-08-08 ENCOUNTER — Ambulatory Visit (INDEPENDENT_AMBULATORY_CARE_PROVIDER_SITE_OTHER)
Admission: RE | Admit: 2015-08-08 | Discharge: 2015-08-08 | Disposition: A | Discharge status: Home / Self Care | Payer: Medicare Other | Source: Ambulatory Visit | Attending: Family | Admitting: Family

## 2015-08-08 ENCOUNTER — Other Ambulatory Visit (INDEPENDENT_AMBULATORY_CARE_PROVIDER_SITE_OTHER): Payer: Medicare Other

## 2015-08-08 VITALS — BP 138/82 | HR 80 | Temp 98.2°F | Resp 18 | Ht 58.5 in | Wt 129.8 lb

## 2015-08-08 DIAGNOSIS — M79662 Pain in left lower leg: Secondary | ICD-10-CM | POA: Insufficient documentation

## 2015-08-08 DIAGNOSIS — E785 Hyperlipidemia, unspecified: Secondary | ICD-10-CM

## 2015-08-08 DIAGNOSIS — S8992XA Unspecified injury of left lower leg, initial encounter: Secondary | ICD-10-CM | POA: Diagnosis not present

## 2015-08-08 LAB — COMPREHENSIVE METABOLIC PANEL
ALT: 14 U/L (ref 0–35)
AST: 19 U/L (ref 0–37)
Albumin: 4.1 g/dL (ref 3.5–5.2)
Alkaline Phosphatase: 62 U/L (ref 39–117)
BILIRUBIN TOTAL: 0.4 mg/dL (ref 0.2–1.2)
BUN: 19 mg/dL (ref 6–23)
CO2: 27 meq/L (ref 19–32)
CREATININE: 1.26 mg/dL — AB (ref 0.40–1.20)
Calcium: 9.6 mg/dL (ref 8.4–10.5)
Chloride: 105 mEq/L (ref 96–112)
GFR: 45.24 mL/min — AB (ref >60.00)
GLUCOSE: 94 mg/dL (ref 70–99)
Potassium: 4.5 mEq/L (ref 3.5–5.1)
Sodium: 141 mEq/L (ref 135–145)
Total Protein: 7.2 g/dL (ref 6.0–8.3)

## 2015-08-08 LAB — LIPID PANEL
CHOL/HDL RATIO: 3
Cholesterol: 176 mg/dL (ref 0–200)
HDL: 55.1 mg/dL (ref >39.00)
LDL CALC: 107 mg/dL — AB (ref 0–99)
NONHDL: 121.04
Triglycerides: 72 mg/dL (ref 0.0–149.0)
VLDL: 14.4 mg/dL (ref 0.0–40.0)

## 2015-08-08 LAB — CBC
HCT: 38.5 % (ref 36.0–46.0)
Hemoglobin: 12.7 g/dL (ref 12.0–15.0)
MCHC: 33 g/dL (ref 30.0–36.0)
MCV: 99.8 fl (ref 78.0–100.0)
Platelets: 255 10*3/uL (ref 150.0–400.0)
RBC: 3.86 Mil/uL — AB (ref 3.87–5.11)
RDW: 13.7 % (ref 11.5–15.5)
WBC: 5.7 10*3/uL (ref 4.0–10.5)

## 2015-08-08 LAB — TSH: TSH: 1.28 u[IU]/mL (ref 0.35–4.50)

## 2015-08-08 NOTE — Progress Notes (Signed)
Subjective:    Patient ID: Jessica Casey, female    DOB: 11/27/1950, 64 y.o.   MRN: [id number]  Chief Complaint  Patient presents with  . Leg Pain    had a fall about 2 weeks ago and hurt left leg, still has some swelling and bruising    HPI:  Jessica Casey is a 64 y.o. female who  has a past medical history of DDD (degenerative disc disease); Depression; Osteoporosis; Colonic polyp (2005 & 2013); Heart murmur; H/O echocardiogram; Mononucleosis (1971); and Pinched nerve in neck. and presents today for an acute office visit.  1.) Leg pain - Associated symptom of pain located in her left lower leg has been going on for about 2 weeks and started with a fall when she hit a railing. Describes some tenderness, bruising, and swelling. Modifying factors include ice which did help. Course has improved over the course. Pain severity depends on   2.) Routine blood work - patient requests her annual routine blood work to be completed.  Allergies  Allergen Reactions  . Gabapentin     REACTION: TOUNGE SWELLING, ITCHING     Current Outpatient Prescriptions on File Prior to Visit  Medication Sig Dispense Refill  . alendronate (FOSAMAX) 70 MG tablet TAKE 1 TABLET EVERY SUNDAY. TAKE WITH A FULL GLASS OF WATER ON AN EMPTY STOMACH 12 tablet 3  . aspirin 81 MG tablet Take 81 mg by mouth daily.      . Biotin 300 MCG TABS Take 300 mcg by mouth daily.      Marland Kitchen buPROPion (WELLBUTRIN XL) 300 MG 24 hr tablet Take 1 tablet (300 mg total) by mouth daily. 90 tablet 3  . Cholecalciferol (VITAMIN D) 2000 UNITS CAPS Take by mouth.    . conjugated estrogens (PREMARIN) vaginal cream Use pea size amount to urethra twice a week at bedtime. 30 g 1  . escitalopram (LEXAPRO) 20 MG tablet Take 1 tablet (20 mg total) by mouth daily. 90 tablet 3  . Omega-3 Fatty Acids (FISH OIL PO) Take by mouth.    . sulfamethoxazole-trimethoprim (BACTRIM DS) 800-160 MG tablet Take 1 tablet by mouth 2 (two) times daily. Take for 7 days.  14 tablet 0   No current facility-administered medications on file prior to visit.    Past Medical History  Diagnosis Date  . DDD (degenerative disc disease)   . Depression   . Osteoporosis   . Colonic polyp 2005 & 2013  . Heart murmur     MVP- echo- 2010, no symptoms   . H/O echocardiogram     before 2000, no need for f/u /w cardiac   . Mononucleosis 1971  . Pinched nerve in neck     Review of Systems  Musculoskeletal:       Positive for leg pain.   Neurological: Negative for weakness and numbness.      Objective:    BP 138/82 mmHg  Pulse 80  Temp(Src) 98.2 F (36.8 C) (Oral)  Resp 18  Ht 4' 10.5" (1.486 m)  Wt 129 lb 12.8 oz (58.877 kg)  BMI 26.66 kg/m2  SpO2 98%  LMP 10/01/1995 (Approximate) Nursing note and vital signs reviewed.  Physical Exam  Constitutional: She is oriented to person, place, and time. She appears well-developed and well-nourished. No distress.  Cardiovascular: Normal rate, regular rhythm, normal heart sounds and intact distal pulses.   Pulmonary/Chest: Effort normal and breath sounds normal.  Musculoskeletal:  Left tibia/fibula - Mild deformity with edema noted.  Discoloration present distally. Tenderness over anterior lateral tibia/fibila approximately midway between knee and ankle. Ankle and knee range of motion intact and appropriate. Distal pulses and sensation are intact and appropriate.  Neurological: She is alert and oriented to person, place, and time.  Skin: Skin is warm and dry.  Psychiatric: She has a normal mood and affect. Her behavior is normal. Judgment and thought content normal.       Assessment & Plan:   Problem List Items Addressed This Visit      Other   Pain of left lower leg - Primary    Symptoms and exam consistent with hematoma however cannot rule out underlying fracture. Obtain x-rays to rule out fracture. Treat conservatively with ice/heat, over-the-counter medications as needed for symptom relief and elevation.  Follow-up pending x-ray results.      Relevant Orders   DG Tibia/Fibula Left    Other Visit Diagnoses    Hyperlipidemia with target LDL less than 100        Relevant Orders    Comprehensive metabolic panel    Lipid panel    TSH    CBC

## 2015-08-08 NOTE — Progress Notes (Signed)
Pre visit review using our clinic review tool, if applicable. No additional management support is needed unless otherwise documented below in the visit note. 

## 2015-08-08 NOTE — Patient Instructions (Signed)
Thank you for choosing [facility].  Summary/Instructions:  Please use over the counter anti-inflammatories as needed for discomfort.   Keep your leg elevated use compression as tolerated. Heat/ice 2-3 times daily or as needed.  Follow up pending x-ray results.   Please stop by the lab on the basement level of the building for your blood work. Your results will be released to [facility] (or called to you) after review, usually within 72 hours after test completion. If any changes need to be made, you will be notified at that same time.  Please stop by radiology on the basement level of the building for your x-rays. Your results will be released to [place] (or called to you) after review, usually within 72 hours after test completion. If any treatments or changes are necessary, you will be notified at that same time.  If your symptoms worsen or fail to improve, please contact our office for further instruction, or in case of emergency go directly to the emergency room at the closest medical facility.

## 2015-08-08 NOTE — Assessment & Plan Note (Signed)
Symptoms and exam consistent with hematoma however cannot rule out underlying fracture. Obtain x-rays to rule out fracture. Treat conservatively with ice/heat, over-the-counter medications as needed for symptom relief and elevation. Follow-up pending x-ray results.

## 2015-08-17 ENCOUNTER — Telehealth: Payer: Self-pay

## 2015-08-17 NOTE — Telephone Encounter (Signed)
Spoke with patient and notified,per Dr. Quincy Simmonds, BMD has improved.  She now has Osteopenia and not Osteoporosis.  Advised to continue Fosamax and next BMD in 2 years.  See copy of BMD in Epic from [place].

## 2015-12-31 ENCOUNTER — Other Ambulatory Visit: Payer: Self-pay | Admitting: Obstetrics and Gynecology

## 2016-01-01 NOTE — Telephone Encounter (Signed)
Medication refill request: fosamax  Last AEX:  06/21/15 Dr. Quincy Simmonds Next AEX: 07/05/16 Dr. Quincy Simmonds Last MMG (if hormonal medication request): 04/22/15 [id number]:Neg Refill authorized: 06/21/15 #12tabs/3R. [facility]

## 2016-01-24 ENCOUNTER — Telehealth: Payer: Self-pay | Admitting: Obstetrics and Gynecology

## 2016-01-24 MED ORDER — ALENDRONATE SODIUM 70 MG PO TABS: ORAL_TABLET | ORAL | Status: DC

## 2016-01-24 MED ORDER — ESCITALOPRAM OXALATE 20 MG PO TABS: 20.0000 mg | ORAL_TABLET | Freq: Every day | ORAL | Status: DC

## 2016-01-24 MED ORDER — BUPROPION HCL ER (XL) 300 MG PO TB24: 300.0000 mg | ORAL_TABLET | Freq: Every day | ORAL | Status: DC

## 2016-01-24 NOTE — Telephone Encounter (Addendum)
Medication refill request: Wellbutrin 300 mg ; Lexapro 20 mg; Fosamax 70 mg Last AEX:  06/21/2015 with BS #90/3 rfs was sent for all x 1 year Next AEX: 07/05/16 with BS  Last MMG (if hormonal medication request): 04/22/2015 bi-rads 1: neg Refill authorized: #90/1 rfs for all medications  #90/1 rfs sent for all to [facility] to last patient until AEX Routed to provider for review, encounter closed.

## 2016-01-24 NOTE — Telephone Encounter (Addendum)
Patient has a new pharmacy and would like refills for wellbutrin, lexapro, and fosamax sent to [facility] at [phone] for 3 month supply. Patient is out of the fosamax and would like a month's supply called in to the [facility] on [address] at [phone].

## 2016-02-22 ENCOUNTER — Encounter (HOSPITAL_COMMUNITY): Payer: Self-pay | Admitting: Emergency Medicine

## 2016-02-22 ENCOUNTER — Ambulatory Visit (HOSPITAL_COMMUNITY)
Admission: EM | Admit: 2016-02-22 | Discharge: 2016-02-22 | Disposition: A | Discharge status: Home / Self Care | Payer: Medicare Other | Attending: Family Medicine | Admitting: Family Medicine

## 2016-02-22 DIAGNOSIS — J069 Acute upper respiratory infection, unspecified: Secondary | ICD-10-CM

## 2016-02-22 MED ORDER — DOXYCYCLINE HYCLATE 100 MG PO CAPS: 100.0000 mg | ORAL_CAPSULE | Freq: Two times a day (BID) | ORAL | Status: DC

## 2016-02-22 MED ORDER — IPRATROPIUM BROMIDE 0.06 % NA SOLN: 2.0000 | Freq: Four times a day (QID) | NASAL | Status: DC

## 2016-02-22 MED ORDER — GUAIFENESIN-CODEINE 100-10 MG/5ML PO SYRP: 10.0000 mL | ORAL_SOLUTION | Freq: Four times a day (QID) | ORAL | Status: DC | PRN

## 2016-02-22 NOTE — ED Provider Notes (Signed)
CSN: [id number]     Arrival date & time 02/22/16  1255 History   First MD Initiated Contact with Patient 02/22/16 1303     Chief Complaint  Patient presents with  . URI   (Consider location/radiation/quality/duration/timing/severity/associated sxs/prior Treatment) Patient is a 65 y.o. female presenting with URI. The history is provided by the patient.  URI Presenting symptoms: congestion, cough and rhinorrhea   Presenting symptoms: no fever and no sore throat   Severity:  Mild Onset quality:  Gradual Duration:  1 week Progression:  Unchanged Chronicity:  New Relieved by:  None tried Worsened by:  Nothing tried Ineffective treatments:  None tried Associated symptoms: no headaches, no swollen glands and no wheezing     Past Medical History  Diagnosis Date  . DDD (degenerative disc disease)   . Depression   . Osteoporosis   . Colonic polyp 2005 & 2013  . Heart murmur     MVP- echo- 2010, no symptoms   . H/O echocardiogram     before 2000, no need for f/u /w cardiac   . Mononucleosis 1971  . Pinched nerve in neck    Past Surgical History  Procedure Laterality Date  . Umbilical hernia repair    . Cervical fusion      2 proceduresr Vertell Limber  . Lumbar disc surgery      Dr.Stern  . Rotator cuff repair      Dr.Wainer  . Colonoscopy with polypectomy  2013    Dr Deatra Ina  . Knee surgery      Dr Wonda Olds ; bursa cystectomy  . Carpal tunnel release Right   . Cataract extraction, bilateral  2013    Dr Gershon Crane, Viona Gilmore IOL  . Hernia repair    . Fracture surgery      L wrist - June 2014  . Wrist fracture surgery Left 02/2013    Dr. Kathryne Hitch  . Anterior cervical decomp/discectomy fusion N/A 05/20/2013    Procedure: ANTERIOR CERVICAL DECOMPRESSION/DISCECTOMY FUSION 2 LEVELS;  Surgeon: Erline Levine, MD;  Location: [facility];  Service: Neurosurgery;  Laterality: N/A;  Cervical Seven-Thoracic One, Thoracic One-Two Anterior cervical decompression/Diskectomy/Fusion   Family History   Problem Relation Age of Onset  . Diabetes Father   . Coronary artery disease Father     S/P CBAG , no MI -- Dec  . Depression Father   . Diverticulosis Father   . Breast cancer Mother   . Stroke Mother 90    Dec  . Breast cancer Sister   . Depression Sister   . Depression Sister   . Depression Sister   . Depression Sister   . Depression Brother   . Bipolar disorder Other     Neice  . Colon polyps Sister   . Colon cancer Neg Hx    Social History  Substance Use Topics  . Smoking status: Never Smoker   . Smokeless tobacco: None  . Alcohol Use: No   OB History    Gravida Para Term Preterm AB TAB SAB Ectopic Multiple Living   2 2 2       2      Review of Systems  Constitutional: Negative.  Negative for fever.  HENT: Positive for congestion, postnasal drip and rhinorrhea. Negative for sinus pressure and sore throat.   Respiratory: Positive for cough. Negative for shortness of breath and wheezing.   Neurological: Negative for headaches.  All other systems reviewed and are negative.   Allergies  Gabapentin  Home Medications  Prior to Admission medications   Medication Sig Start Date End Date Taking? Authorizing Provider  alendronate (FOSAMAX) 70 MG tablet TAKE 1 TABLET EVERY SUNDAY. TAKE WITH A FULL GLASS OF WATER ON AN EMPTY STOMACH 01/24/16  Yes Brook E Yisroel Ramming, MD  aspirin 81 MG tablet Take 81 mg by mouth daily.     Yes Historical Provider, MD  Biotin 300 MCG TABS Take 300 mcg by mouth daily.     Yes Historical Provider, MD  buPROPion (WELLBUTRIN XL) 300 MG 24 hr tablet Take 1 tablet (300 mg total) by mouth daily. 01/24/16  Yes Brook E Yisroel Ramming, MD  Cholecalciferol (VITAMIN D) 2000 UNITS CAPS Take by mouth.   Yes Historical Provider, MD  conjugated estrogens (PREMARIN) vaginal cream Use pea size amount to urethra twice a week at bedtime. 06/21/15  Yes Brook E Yisroel Ramming, MD  escitalopram (LEXAPRO) 20 MG tablet Take 1 tablet (20 mg total) by mouth  daily. 01/24/16  Yes [place], MD  Omega-3 Fatty Acids (FISH OIL PO) Take by mouth.   Yes Historical Provider, MD  sulfamethoxazole-trimethoprim (BACTRIM DS) 800-160 MG tablet Take 1 tablet by mouth 2 (two) times daily. Take for 7 days. 08/03/15   Brook Oletta Lamas, MD   Meds Ordered and Administered this Visit  Medications - No data to display  BP 146/73 mmHg  Pulse 96  Temp(Src) 98.8 F (37.1 C) (Oral)  Resp 20  SpO2 96%  LMP 10/01/1995 (Approximate) No data found.   Physical Exam  Constitutional: She is oriented to person, place, and time. She appears well-developed and well-nourished.  HENT:  Right Ear: External ear normal.  Left Ear: External ear normal.  Mouth/Throat: Oropharynx is clear and moist.  Eyes: Pupils are equal, round, and reactive to light.  Neck: Normal range of motion. Neck supple.  Cardiovascular: Normal rate, regular rhythm and normal heart sounds.   Pulmonary/Chest: Effort normal and breath sounds normal. She has no wheezes. She has no rales.  Lymphadenopathy:    She has no cervical adenopathy.  Neurological: She is alert and oriented to person, place, and time.  Skin: Skin is warm and dry.  Nursing note and vitals reviewed.   ED Course  Procedures (including critical care time)  Labs Review Labs Reviewed - No data to display  Imaging Review No results found.   Visual Acuity Review  Right Eye Distance:   Left Eye Distance:   Bilateral Distance:    Right Eye Near:   Left Eye Near:    Bilateral Near:         MDM   1. URI (upper respiratory infection)        Billy Fischer, MD 02/22/16 1319

## 2016-02-22 NOTE — Discharge Instructions (Signed)
Drink plenty of fluids as discussed, use medicine as prescribed, and mucinex or delsym for cough. Return or see your doctor if further problems °

## 2016-02-22 NOTE — ED Notes (Signed)
C/o cold sx onset 5/18 associated w/cough, LH due to cough and feeling "fussy of head" Denies fevers, chills A&O x4... No acute distress.

## 2016-02-28 ENCOUNTER — Ambulatory Visit (INDEPENDENT_AMBULATORY_CARE_PROVIDER_SITE_OTHER)
Admission: RE | Admit: 2016-02-28 | Discharge: 2016-02-28 | Disposition: A | Discharge status: Home / Self Care | Payer: Medicare Other | Source: Ambulatory Visit | Attending: Internal Medicine | Admitting: Internal Medicine

## 2016-02-28 ENCOUNTER — Ambulatory Visit (INDEPENDENT_AMBULATORY_CARE_PROVIDER_SITE_OTHER): Payer: Medicare Other | Admitting: Internal Medicine

## 2016-02-28 ENCOUNTER — Ambulatory Visit: Payer: Self-pay | Admitting: Family

## 2016-02-28 ENCOUNTER — Other Ambulatory Visit (INDEPENDENT_AMBULATORY_CARE_PROVIDER_SITE_OTHER): Payer: Medicare Other

## 2016-02-28 ENCOUNTER — Encounter: Payer: Self-pay | Admitting: Internal Medicine

## 2016-02-28 ENCOUNTER — Telehealth: Payer: Self-pay | Admitting: Internal Medicine

## 2016-02-28 VITALS — BP 122/78 | HR 110 | Temp 99.1°F | Resp 20 | Wt 119.0 lb

## 2016-02-28 DIAGNOSIS — D649 Anemia, unspecified: Secondary | ICD-10-CM

## 2016-02-28 DIAGNOSIS — R059 Cough, unspecified: Secondary | ICD-10-CM

## 2016-02-28 DIAGNOSIS — R5383 Other fatigue: Secondary | ICD-10-CM

## 2016-02-28 DIAGNOSIS — R05 Cough: Secondary | ICD-10-CM

## 2016-02-28 LAB — BASIC METABOLIC PANEL
BUN: 17 mg/dL (ref 6–23)
CALCIUM: 9.4 mg/dL (ref 8.4–10.5)
CO2: 30 mEq/L (ref 19–32)
Chloride: 106 mEq/L (ref 96–112)
Creatinine, Ser: 0.99 mg/dL (ref 0.40–1.20)
GFR: 59.66 mL/min — AB (ref >60.00)
GLUCOSE: 95 mg/dL (ref 70–99)
Potassium: 4.1 mEq/L (ref 3.5–5.1)
Sodium: 141 mEq/L (ref 135–145)

## 2016-02-28 LAB — CBC WITH DIFFERENTIAL/PLATELET
BASOS ABS: 0 10*3/uL (ref 0.0–0.1)
Basophils Relative: 0 % (ref 0.0–3.0)
EOS ABS: 0.1 10*3/uL (ref 0.0–0.7)
Eosinophils Relative: 1.4 % (ref 0.0–5.0)
HCT: 36 % (ref 36.0–46.0)
Hemoglobin: 11.8 g/dL — ABNORMAL LOW (ref 12.0–15.0)
LYMPHS ABS: 2.3 10*3/uL (ref 0.7–4.0)
Lymphocytes Relative: 22.8 % (ref 12.0–46.0)
MCHC: 32.8 g/dL (ref 30.0–36.0)
MCV: 98.1 fl (ref 78.0–100.0)
MONO ABS: 0.5 10*3/uL (ref 0.1–1.0)
Monocytes Relative: 4.8 % (ref 3.0–12.0)
NEUTROS PCT: 71 % (ref 43.0–77.0)
Neutro Abs: 7.3 10*3/uL (ref 1.4–7.7)
Platelets: 249 10*3/uL (ref 150.0–400.0)
RBC: 3.67 Mil/uL — AB (ref 3.87–5.11)
RDW: 13.5 % (ref 11.5–15.5)
WBC: 10.2 10*3/uL (ref 4.0–10.5)

## 2016-02-28 MED ORDER — LEVOFLOXACIN 500 MG PO TABS: 500.0000 mg | ORAL_TABLET | Freq: Every day | ORAL | Status: DC

## 2016-02-28 MED ORDER — HYDROCODONE-HOMATROPINE 5-1.5 MG/5ML PO SYRP: 5.0000 mL | ORAL_SOLUTION | Freq: Four times a day (QID) | ORAL | Status: DC | PRN

## 2016-02-28 NOTE — Progress Notes (Signed)
Pre visit review using our clinic review tool, if applicable. No additional management support is needed unless otherwise documented below in the visit note. 

## 2016-02-28 NOTE — Patient Instructions (Signed)
Please take all new medication as prescribed - the antibiotic, and cough medicine  Please continue all other medications as before, and refills have been done if requested.  Please have the pharmacy call with any other refills you may need.  Please keep your appointments with your specialists as you may have planned  Please go to the XRAY Department in the Basement (go straight as you get off the elevator) for the x-ray testing  Please go to the LAB in the Basement (turn left off the elevator) for the tests to be done today  You will be contacted by phone if any changes need to be made immediately.  Otherwise, you will receive a letter about your results with an explanation, but please check with MyChart first.  Please remember to sign up for MyChart if you have not done so, as this will be important to you in the future with finding out test results, communicating by private email, and scheduling acute appointments online when needed.

## 2016-02-28 NOTE — Assessment & Plan Note (Signed)
Also for cbc per son's urging today to f/u,  to f/u any worsening symptoms or concerns

## 2016-02-28 NOTE — Assessment & Plan Note (Signed)
Etiology unclear, Exam otherwise benign, to check labs as documented, follow with expectant management, for bmp

## 2016-02-28 NOTE — Assessment & Plan Note (Signed)
Mod persistent symptoms of primarily cough, fever, without worsening wheezing or sob, c/w possible bronchitis vs pna, ok to d/c doxy, for change to levaquin course,  to f/u any worsening symptoms or concerns, for cxr today as well

## 2016-02-28 NOTE — Progress Notes (Signed)
Subjective:    Patient ID: Jessica Casey, female    DOB: [birth year], 65 y.o.   MRN: [id number]  HPI  Here with over 10 days onset illness started with mild ST, then some URi symptoms with congestion, but now worsening scant prod cough last 3-4 days, cant stop coughing, hard to sleep at night, gets some sob after the cough but o/w Pt denies chest pain, increased sob or doe, wheezing, orthopnea, PND, increased LE swelling, palpitations, dizziness or syncope.  No prior hx of pna, aspiration, immune deficiency, recent travel or pets. Son is internist at [facility], has been encouraging her to have a cxr and labs.  Does c/o ongoing fatigue, but denies signficant daytime hypersomnolence. No recent overt bleeding or blood loss.  Was seen at [facility] on May 25, but doxycycline has not seemed to improve symptoms Past Medical History  Diagnosis Date  . DDD (degenerative disc disease)   . Depression   . Osteoporosis   . Colonic polyp 2005 & 2013  . Heart murmur     MVP- echo- 2010, no symptoms   . H/O echocardiogram     before 2000, no need for f/u /w cardiac   . Mononucleosis 1971  . Pinched nerve in neck    Past Surgical History  Procedure Laterality Date  . Umbilical hernia repair    . Cervical fusion      2 proceduresr Vertell Limber  . Lumbar disc surgery      Dr.Stern  . Rotator cuff repair      Dr.Wainer  . Colonoscopy with polypectomy  2013    Dr Deatra Ina  . Knee surgery      Dr Wonda Olds ; bursa cystectomy  . Carpal tunnel release Right   . Cataract extraction, bilateral  2013    Dr Gershon Crane, Viona Gilmore IOL  . Hernia repair    . Fracture surgery      L wrist - June 2014  . Wrist fracture surgery Left 02/2013    Dr. Kathryne Hitch  . Anterior cervical decomp/discectomy fusion N/A 05/20/2013    Procedure: ANTERIOR CERVICAL DECOMPRESSION/DISCECTOMY FUSION 2 LEVELS;  Surgeon: Erline Levine, MD;  Location: [place] [facility];  Service: Neurosurgery;  Laterality: N/A;  Cervical Seven-Thoracic One, Thoracic One-Two  Anterior cervical decompression/Diskectomy/Fusion    reports that she has never smoked. She does not have any smokeless tobacco history on file. She reports that she does not drink alcohol or use illicit drugs. family history includes Bipolar disorder in her other; Breast cancer in her mother and sister; Colon polyps in her sister; Coronary artery disease in her father; Depression in her brother, father, sister, sister, sister, and sister; Diabetes in her father; Diverticulosis in her father; Stroke (age of onset: 43) in her mother. There is no history of Colon cancer. Allergies  Allergen Reactions  . Gabapentin     REACTION: TOUNGE SWELLING, ITCHING   Current Outpatient Prescriptions on File Prior to Visit  Medication Sig Dispense Refill  . alendronate (FOSAMAX) 70 MG tablet TAKE 1 TABLET EVERY SUNDAY. TAKE WITH A FULL GLASS OF WATER ON AN EMPTY STOMACH 12 tablet 1  . aspirin 81 MG tablet Take 81 mg by mouth daily.      . Biotin 300 MCG TABS Take 300 mcg by mouth daily.      Marland Kitchen buPROPion (WELLBUTRIN XL) 300 MG 24 hr tablet Take 1 tablet (300 mg total) by mouth daily. 90 tablet 1  . Cholecalciferol (VITAMIN D) 2000 UNITS CAPS Take  by mouth.    . conjugated estrogens (PREMARIN) vaginal cream Use pea size amount to urethra twice a week at bedtime. 30 g 1  . escitalopram (LEXAPRO) 20 MG tablet Take 1 tablet (20 mg total) by mouth daily. 90 tablet 1  . guaiFENesin-codeine (ROBITUSSIN AC) 100-10 MG/5ML syrup Take 10 mLs by mouth 4 (four) times daily as needed for cough. 180 mL 1  . ipratropium (ATROVENT) 0.06 % nasal spray Place 2 sprays into the nose 4 (four) times daily. 15 mL 1  . Omega-3 Fatty Acids (FISH OIL PO) Take by mouth.    . sulfamethoxazole-trimethoprim (BACTRIM DS) 800-160 MG tablet Take 1 tablet by mouth 2 (two) times daily. Take for 7 days. 14 tablet 0   No current facility-administered medications on file prior to visit.   Review of Systems  Constitutional: Negative for unusual  diaphoresis or night sweats HENT: Negative for ear swelling or discharge Eyes: Negative for worsening visual haziness  Respiratory: Negative for choking and stridor.   Gastrointestinal: Negative for distension or worsening eructation Genitourinary: Negative for retention or change in urine volume.  Musculoskeletal: Negative for other MSK pain or swelling Skin: Negative for color change and worsening wound Neurological: Negative for tremors and numbness other than noted  Psychiatric/Behavioral: Negative for decreased concentration or agitation other than above       Objective:   Physical Exam BP 122/78 mmHg  Pulse 110  Temp(Src) 99.1 F (37.3 C) (Oral)  Resp 20  Wt 119 lb (53.978 kg)  SpO2 95%  LMP 10/01/1995 (Approximate) VS noted, mild ill, feverish, freq coughing Constitutional: Pt appears in no apparent distress HENT: Head: NCAT.  Right Ear: External ear normal.  Left Ear: External ear normal.  Bilat tm's with mild erythema.  Max sinus areas non tender.  Pharynx with mod erythema, no exudate Eyes: . Pupils are equal, round, and reactive to light. Conjunctivae and EOM are normal Neck: Normal range of motion. Neck supple. but with bilat submandib LA tender as well Cardiovascular: Normal rate and regular rhythm.   Pulmonary/Chest: Effort normal and breath sounds somewhat decreased without rales or wheezing.  Neurological: Pt is alert. Not confused , motor grossly intact Skin: Skin is warm. No rash, no LE edema Psychiatric: Pt behavior is normal. No agitation.   Assessment & Plan:

## 2016-02-28 NOTE — Telephone Encounter (Signed)
States Dr. Jenny Reichmann sent a script for levaquin.  States that the pharmacy said this would interact with lexapro.  States she is leaving on a plane tomorrow morning.  Would like a call back as soon as possible.

## 2016-02-29 ENCOUNTER — Telehealth: Payer: Self-pay | Admitting: Obstetrics and Gynecology

## 2016-02-29 NOTE — Telephone Encounter (Signed)
Patient called and left a message on the answering machine after hours. She said, "My primary care doctor prescribed levofloxacin for me but the pharmacist said there will be an interaction with my Lexapro and to contact your office for guidance."

## 2016-02-29 NOTE — Telephone Encounter (Signed)
Patient was prescribed Levofloxacin 500 mg daily x 10 days for URI symptoms. Patient is currently on Lexapro 20 mg daily as prescribed by our office. Phamacist encouraged the patient to contact our office due to risk for interaction with medications. Routing to [place] for review and advise.

## 2016-02-29 NOTE — Telephone Encounter (Signed)
Spoke to patient advised her that it would be ok per Dr. Quay Burow since Dr. Jenny Reichmann was not in the office yet, that it was ok for her to take the medication since it was for a short period of time.

## 2016-02-29 NOTE — Telephone Encounter (Signed)
Phone call returned personally to patient.  Prescribed Levofloxacin for respiratory infection not responsive to Doxycycline.  Pharmacist had concern about interaction of Levofloxacin with Lexapro.  I explained that this can cause cardiac arrhythmia.  Patient's internist has approved the Levofloxacin as it is a short term medication.  I gave patient signs and symptoms to watch for - fatigue, palpitations, mal estar.  I told her it is ok for her to take PCN related abx or sulfa abx with her Lexapro if she needs to change the medication.   Encounter may be closed.

## 2016-05-02 ENCOUNTER — Encounter: Payer: Self-pay | Admitting: Obstetrics and Gynecology

## 2016-05-11 ENCOUNTER — Other Ambulatory Visit: Payer: Self-pay | Admitting: Obstetrics and Gynecology

## 2016-05-13 NOTE — Telephone Encounter (Signed)
Medication refill request: Fosamax  Last AEX:  06-21-15 Next AEX: 07-05-16 Last MMG (if hormonal medication request): 04-22-16 WNL  Refill authorized: please advise

## 2016-05-21 ENCOUNTER — Other Ambulatory Visit: Payer: Self-pay | Admitting: Obstetrics and Gynecology

## 2016-05-22 NOTE — Telephone Encounter (Signed)
Medication refill request: Wellbutrin and Lexapro Last AEX:  06-21-15 Next AEX: 07-05-16 Last MMG (if hormonal medication request): 04-22-16 WNL Refill authorized: please advise

## 2016-07-05 ENCOUNTER — Ambulatory Visit (INDEPENDENT_AMBULATORY_CARE_PROVIDER_SITE_OTHER): Payer: Medicare Other | Admitting: Obstetrics and Gynecology

## 2016-07-05 ENCOUNTER — Encounter: Payer: Self-pay | Admitting: Obstetrics and Gynecology

## 2016-07-05 VITALS — BP 110/60 | HR 80 | Resp 16 | Ht 59.0 in | Wt 125.0 lb

## 2016-07-05 DIAGNOSIS — Z01419 Encounter for gynecological examination (general) (routine) without abnormal findings: Secondary | ICD-10-CM | POA: Diagnosis not present

## 2016-07-05 DIAGNOSIS — Z0000 Encounter for general adult medical examination without abnormal findings: Secondary | ICD-10-CM | POA: Diagnosis not present

## 2016-07-05 LAB — POCT URINALYSIS DIPSTICK
Bilirubin, UA: NEGATIVE
GLUCOSE UA: NEGATIVE
Ketones, UA: NEGATIVE
NITRITE UA: NEGATIVE
PH UA: 5
Protein, UA: NEGATIVE
RBC UA: NEGATIVE
UROBILINOGEN UA: NEGATIVE

## 2016-07-05 MED ORDER — ESCITALOPRAM OXALATE 20 MG PO TABS: 20.0000 mg | ORAL_TABLET | Freq: Every day | ORAL | 3 refills | Status: DC

## 2016-07-05 MED ORDER — ALENDRONATE SODIUM 70 MG PO TABS: ORAL_TABLET | ORAL | 3 refills | Status: DC

## 2016-07-05 MED ORDER — BUPROPION HCL ER (XL) 300 MG PO TB24: 300.0000 mg | ORAL_TABLET | Freq: Every day | ORAL | 3 refills | Status: DC

## 2016-07-05 NOTE — Progress Notes (Signed)
65 y.o. [id number] Married Caucasian female here for annual exam.    Moved to [place].  Retired! Grandson with neurological deficits.  No UTI since stopped doing daily dosing one year ago.  Not really using Premarin cream.   Sister and mother with breast cancer.  Sister negative for BRCA.   Son is a Engineer, drilling in [place].   PCP: Dr.  Linna Darner  Patient's last menstrual period was 10/01/1995 (approximate).           Sexually active: Yes.    The current method of family planning is post menopausal status.    Exercising: No.  The patient does not participate in regular exercise at present. Smoker:  no  Health Maintenance: Pap:  05/14/13 Neg. Neg HR HPV History of abnormal Pap:  no MMG:  04/22/16 BIRADS1 negative Colonoscopy:  06-25-12 polyps with Dr. Erskine Emery.  Next due 05/2017. BMD:   08/07/15 Osteopenia.  History of Osteoporosis with Fosamax treatment. TDaP:  06/09/2007 Flu vaccine:  Already had. Already took Shingles vaccine.  Screening Labs:  Hb today: PCP, Urine today: WBC=Trace - asymptomatic.     reports that she has never smoked. She has never used smokeless tobacco. She reports that she does not drink alcohol or use drugs.  Past Medical History:  Diagnosis Date  . Colonic polyp 2005 & 2013  . DDD (degenerative disc disease)   . Depression   . H/O echocardiogram    before 2000, no need for f/u /w cardiac   . Heart murmur    MVP- echo- 2010, no symptoms   . Mononucleosis 1971  . Osteoporosis   . Pinched nerve in neck     Past Surgical History:  Procedure Laterality Date  . ANTERIOR CERVICAL DECOMP/DISCECTOMY FUSION N/A 05/20/2013   Procedure: ANTERIOR CERVICAL DECOMPRESSION/DISCECTOMY FUSION 2 LEVELS;  Surgeon: Erline Levine, MD;  Location: [place] [facility];  Service: Neurosurgery;  Laterality: N/A;  Cervical Seven-Thoracic One, Thoracic One-Two Anterior cervical decompression/Diskectomy/Fusion  . CARPAL TUNNEL RELEASE Right   . CATARACT EXTRACTION, BILATERAL   2013   Dr Gershon Crane, Viona Gilmore IOL  . CERVICAL FUSION     2 proceduresr Vertell Limber  . colonoscopy with polypectomy  2013   Dr Deatra Ina  . FRACTURE SURGERY     L wrist - June 2014  . HERNIA REPAIR    . KNEE SURGERY     Dr Wonda Olds ; bursa cystectomy  . LUMBAR DISC SURGERY     Dr.Stern  . ROTATOR CUFF REPAIR     Dr.Wainer  . UMBILICAL HERNIA REPAIR    . WRIST FRACTURE SURGERY Left 02/2013   Dr. Kathryne Hitch    Current Outpatient Prescriptions  Medication Sig Dispense Refill  . alendronate (FOSAMAX) 70 MG tablet TAKE 1 TABLET EVERY SUNDAY. TAKE WITH A FULL GLASS OF  WATER ON AN EMPTY STOMACH 12 tablet 0  . aspirin 81 MG tablet Take 81 mg by mouth daily.      . Biotin 300 MCG TABS Take 300 mcg by mouth daily.      Marland Kitchen buPROPion (WELLBUTRIN XL) 300 MG 24 hr tablet Take 1 tablet by mouth  daily 90 tablet 0  . Cholecalciferol (VITAMIN D) 2000 UNITS CAPS Take by mouth.    . conjugated estrogens (PREMARIN) vaginal cream Use pea size amount to urethra twice a week at bedtime. 30 g 1  . escitalopram (LEXAPRO) 20 MG tablet Take 1 tablet by mouth  daily 90 tablet 0  . Omega-3 Fatty Acids (FISH OIL PO)  Take by mouth.     No current facility-administered medications for this visit.     Family History  Problem Relation Age of Onset  . Diabetes Father   . Coronary artery disease Father     S/P CBAG , no MI -- Dec  . Depression Father   . Diverticulosis Father   . Breast cancer Mother   . Stroke Mother 90    Dec  . Breast cancer Sister   . Depression Sister   . Depression Sister   . Depression Sister   . Depression Sister   . Depression Brother   . Bipolar disorder Other     Neice  . Colon polyps Sister   . Colon cancer Neg Hx     ROS:  Pertinent items are noted in HPI.  Otherwise, a comprehensive ROS was negative.  Exam:   BP 110/60 (BP Location: Right Arm, Patient Position: Sitting, Cuff Size: Normal)   Pulse 80   Resp 16   Ht 4' 11"  (1.499 m)   Wt 125 lb (56.7 kg)   LMP 10/01/1995  (Approximate)   BMI 25.25 kg/m     General appearance: alert, cooperative and appears stated age Head: Normocephalic, without obvious abnormality, atraumatic Neck: no adenopathy, supple, symmetrical, trachea midline and thyroid normal to inspection and palpation Lungs: clear to auscultation bilaterally Breasts: normal appearance, no masses or tenderness, No nipple retraction or dimpling, No nipple discharge or bleeding, No axillary or supraclavicular adenopathy Heart: regular rate and rhythm Abdomen: soft, non-tender; no masses, no organomegaly Extremities: extremities normal, atraumatic, no cyanosis or edema Skin: Skin color, texture, turgor normal. No rashes or lesions Lymph nodes: Cervical, supraclavicular, and axillary nodes normal. No abnormal inguinal nodes palpated Neurologic: Grossly normal  Pelvic: External genitalia:  no lesions              Urethra:  normal appearing urethra with no masses, tenderness or lesions              Bartholins and Skenes: normal                 Vagina: normal appearing vagina with normal color and discharge, no lesions              Cervix: no lesions              Pap taken: Yes.   Bimanual Exam:  Uterus:  normal size, contour, position, consistency, mobility, non-tender              Adnexa: no mass, fullness, tenderness              Rectal exam: Yes.  .  Confirms.              Anus:  normal sphincter tone, no lesions  Chaperone was present for exam.  Assessment:   Well woman visit with normal exam. FH of breast cancer.  BRCA negative.  Hx UTIs. Osteoporosis.  Depression  Plan: Yearly mammogram recommended after age 26.  Recommended self breast exam.  Pap and HR HPV as above. Guidelines for Calcium, Vitamin D, regular exercise program including cardiovascular and weight bearing exercise. Continue Lexapro, Wellbutrin XL, and Fosamax. BMD next year. Patient will establish care with new PCP.  Dr. Linna Darner has retired.  Follow up annually and  prn.     After visit summary provided.

## 2016-07-05 NOTE — Patient Instructions (Signed)

## 2016-07-08 LAB — IPS PAP SMEAR ONLY

## 2016-07-09 ENCOUNTER — Encounter: Payer: Self-pay | Admitting: Obstetrics and Gynecology

## 2016-07-15 ENCOUNTER — Encounter: Payer: Self-pay | Admitting: Nurse Practitioner

## 2016-07-15 ENCOUNTER — Ambulatory Visit (INDEPENDENT_AMBULATORY_CARE_PROVIDER_SITE_OTHER): Payer: Medicare Other | Admitting: Nurse Practitioner

## 2016-07-15 ENCOUNTER — Telehealth: Payer: Self-pay | Admitting: Family Medicine

## 2016-07-15 VITALS — BP 134/78 | HR 96 | Wt 123.0 lb

## 2016-07-15 DIAGNOSIS — S46812A Strain of other muscles, fascia and tendons at shoulder and upper arm level, left arm, initial encounter: Secondary | ICD-10-CM | POA: Diagnosis not present

## 2016-07-15 MED ORDER — CYCLOBENZAPRINE HCL 5 MG PO TABS: 5.0000 mg | ORAL_TABLET | Freq: Three times a day (TID) | ORAL | 1 refills | Status: DC | PRN

## 2016-07-15 MED ORDER — PREDNISONE 10 MG (21) PO TBPK: 10.0000 mg | ORAL_TABLET | ORAL | 0 refills | Status: DC

## 2016-07-15 NOTE — Progress Notes (Signed)
Pre visit review using our clinic review tool, if applicable. No additional management support is needed unless otherwise documented below in the visit note. 

## 2016-07-15 NOTE — Telephone Encounter (Signed)
Patient is requesting to be under the care of an MD. Dr [name], would you be able to accept this patient as a transfer from Dr Linna Darner?

## 2016-07-15 NOTE — Progress Notes (Signed)
Subjective:  Patient ID: Jessica Casey, female    DOB: 03/11/1951  Age: 65 y.o. MRN: [id number]  CC: Shoulder Pain (Left x 1 week, worsening recently. No relief with OTC pain meds/NSAIDS)   Shoulder Pain   The pain is present in the left shoulder. This is a new problem. The current episode started in the past 7 days. There has been no history of extremity trauma. The problem occurs constantly. The problem has been unchanged. The quality of the pain is described as aching and dull. Associated symptoms include a limited range of motion. Pertinent negatives include no fever, inability to bear weight, itching, joint locking, joint swelling, numbness, stiffness or tingling. The symptoms are aggravated by activity and lying down. She has tried NSAIDS, acetaminophen and heat for the symptoms. The treatment provided mild relief. Family history does not include gout or rheumatoid arthritis. Her past medical history is significant for osteoarthritis. There is no history of diabetes, gout or rheumatoid arthritis.    Outpatient Medications Prior to Visit  Medication Sig Dispense Refill  . alendronate (FOSAMAX) 70 MG tablet TAKE 1 TABLET EVERY SUNDAY. TAKE WITH A FULL GLASS OF  WATER ON AN EMPTY STOMACH 12 tablet 3  . aspirin 81 MG tablet Take 81 mg by mouth daily.      . Biotin 300 MCG TABS Take 300 mcg by mouth daily.      Marland Kitchen buPROPion (WELLBUTRIN XL) 300 MG 24 hr tablet Take 1 tablet (300 mg total) by mouth daily. 90 tablet 3  . Cholecalciferol (VITAMIN D) 2000 UNITS CAPS Take by mouth.    . conjugated estrogens (PREMARIN) vaginal cream Use pea size amount to urethra twice a week at bedtime. 30 g 1  . escitalopram (LEXAPRO) 20 MG tablet Take 1 tablet (20 mg total) by mouth daily. 90 tablet 3  . Omega-3 Fatty Acids (FISH OIL PO) Take by mouth.     No facility-administered medications prior to visit.     ROS See HPI  Objective:  BP 134/78   Pulse 96   Wt 123 lb (55.8 kg)   LMP 10/01/1995  (Approximate)   SpO2 95%   BMI 24.84 kg/m   BP Readings from Last 3 Encounters:  07/15/16 134/78  07/05/16 110/60  02/28/16 122/78    Wt Readings from Last 3 Encounters:  07/15/16 123 lb (55.8 kg)  07/05/16 125 lb (56.7 kg)  02/28/16 119 lb (54 kg)    Physical Exam  Constitutional: She is oriented to person, place, and time.  Neck: Normal range of motion. Neck supple. No thyromegaly present.  Cardiovascular: Normal rate.   Pulmonary/Chest: Effort normal.  Musculoskeletal: She exhibits no edema.       Left shoulder: She exhibits tenderness, pain and spasm. She exhibits normal range of motion, no bony tenderness, no swelling, no effusion, no crepitus, normal pulse and normal strength.       Cervical back: Normal.       Left upper arm: Normal.       Left forearm: Normal.       Left hand: Normal.  Lymphadenopathy:    She has no cervical adenopathy.  Neurological: She is alert and oriented to person, place, and time.  Vitals reviewed.   Lab Results  Component Value Date   WBC 10.2 02/28/2016   HGB 11.8 (L) 02/28/2016   HCT 36.0 02/28/2016   PLT 249.0 02/28/2016   GLUCOSE 95 02/28/2016   CHOL 176 08/08/2015   TRIG 72.0 08/08/2015  HDL 55.10 08/08/2015   LDLCALC 107 (H) 08/08/2015   ALT 14 08/08/2015   AST 19 08/08/2015   NA 141 02/28/2016   K 4.1 02/28/2016   CL 106 02/28/2016   CREATININE 0.99 02/28/2016   BUN 17 02/28/2016   CO2 30 02/28/2016   TSH 1.28 08/08/2015    Dg Chest 2 View  Result Date: 02/28/2016 CLINICAL DATA:  Cough, fever EXAM: CHEST  2 VIEW COMPARISON:  05/25/2009 chest radiograph. FINDINGS: Surgical hardware is partially visualized overlying the cervical spine. Stable cardiomediastinal silhouette with normal heart size. No pneumothorax. No pleural effusion. Lungs appear clear, with no acute consolidative airspace disease and no pulmonary edema. IMPRESSION: No active cardiopulmonary disease. Electronically Signed   By: Ilona Sorrel M.D.   On:  02/28/2016 20:22    Assessment & Plan:   Caleah was seen today for shoulder pain.  Diagnoses and all orders for this visit:  Trapezius strain, left, initial encounter -     predniSONE (STERAPRED UNI-PAK 21 TAB) 10 MG (21) TBPK tablet; Take 1 tablet (10 mg total) by mouth as directed. Take as directed on package -     cyclobenzaprine (FLEXERIL) 5 MG tablet; Take 1 tablet (5 mg total) by mouth 3 (three) times daily as needed for muscle spasms.   I am having Ms. Soots start on predniSONE and cyclobenzaprine. I am also having her maintain her aspirin, Biotin, Vitamin D, Omega-3 Fatty Acids (FISH OIL PO), conjugated estrogens, escitalopram, buPROPion, and alendronate.  Meds ordered this encounter  Medications  . predniSONE (STERAPRED UNI-PAK 21 TAB) 10 MG (21) TBPK tablet    Sig: Take 1 tablet (10 mg total) by mouth as directed. Take as directed on package    Dispense:  21 tablet    Refill:  0    Order Specific Question:   Supervising Provider    Answer:   Cassandria Anger [[id number]]  . cyclobenzaprine (FLEXERIL) 5 MG tablet    Sig: Take 1 tablet (5 mg total) by mouth 3 (three) times daily as needed for muscle spasms.    Dispense:  30 tablet    Refill:  1    Order Specific Question:   Supervising Provider    Answer:   Cassandria Anger [[id number]]    Follow-up: No Follow-up on file.  Wilfred Lacy, NP

## 2016-07-15 NOTE — Telephone Encounter (Signed)
It is ok with me. Thanks, BJ 

## 2016-07-15 NOTE — Patient Instructions (Signed)
Alternate between warm and cold compress as needed.  Muscle Cramps and Spasms Muscle cramps and spasms are when muscles tighten by themselves. They usually get better within minutes. Muscle cramps are painful. They are usually stronger and last longer than muscle spasms. Muscle spasms may or may not be painful. They can last a few seconds or much longer. HOME CARE  Drink enough fluid to keep your pee (urine) clear or pale yellow.  Massage, stretch, and relax the muscle.  Use a warm towel, heating pad, or warm shower water on tight muscles.  Place ice on the muscle if it is tender or in pain.  Put ice in a plastic bag.  Place a towel between your skin and the bag.  Leave the ice on for 15-20 minutes, 03-04 times a day.  Only take medicine as told by your doctor. GET HELP RIGHT AWAY IF:  Your cramps or spasms get worse, happen more often, or do not get better with time. MAKE SURE YOU:  Understand these instructions.  Will watch your condition.  Will get help right away if you are not doing well or get worse.   This information is not intended to replace advice given to you by your health care provider. Make sure you discuss any questions you have with your health care provider.   Document Released: 08/29/2008 Document Revised: 01/11/2013 Document Reviewed: 09/02/2012 Elsevier Interactive Patient Education [employer].

## 2016-07-29 NOTE — Telephone Encounter (Signed)
Ms. Jessica Casey, Please schedule a np transfer appt with dr [name] for this patient. Ty

## 2016-07-29 NOTE — Telephone Encounter (Signed)
No problem Pt scheduled

## 2016-08-06 ENCOUNTER — Encounter: Payer: Self-pay | Admitting: Family Medicine

## 2016-08-06 ENCOUNTER — Ambulatory Visit (INDEPENDENT_AMBULATORY_CARE_PROVIDER_SITE_OTHER): Payer: Medicare Other | Admitting: Family Medicine

## 2016-08-06 ENCOUNTER — Ambulatory Visit: Payer: Self-pay | Admitting: Family Medicine

## 2016-08-06 VITALS — BP 120/70 | HR 123 | Temp 98.4°F | Resp 12 | Ht 59.0 in | Wt 126.4 lb

## 2016-08-06 DIAGNOSIS — M159 Polyosteoarthritis, unspecified: Secondary | ICD-10-CM | POA: Insufficient documentation

## 2016-08-06 DIAGNOSIS — M150 Primary generalized (osteo)arthritis: Secondary | ICD-10-CM | POA: Diagnosis not present

## 2016-08-06 DIAGNOSIS — R000 Tachycardia, unspecified: Secondary | ICD-10-CM

## 2016-08-06 DIAGNOSIS — R944 Abnormal results of kidney function studies: Secondary | ICD-10-CM | POA: Diagnosis not present

## 2016-08-06 DIAGNOSIS — F325 Major depressive disorder, single episode, in full remission: Secondary | ICD-10-CM | POA: Diagnosis not present

## 2016-08-06 LAB — MICROALBUMIN / CREATININE URINE RATIO
CREATININE, U: 295.2 mg/dL
MICROALB UR: 1.6 mg/dL (ref 0.0–1.9)
Microalb Creat Ratio: 0.5 mg/g (ref 0.0–30.0)

## 2016-08-06 LAB — BASIC METABOLIC PANEL
BUN: 22 mg/dL (ref 6–23)
CALCIUM: 9.4 mg/dL (ref 8.4–10.5)
CHLORIDE: 104 meq/L (ref 96–112)
CO2: 34 mEq/L — ABNORMAL HIGH (ref 19–32)
CREATININE: 1.08 mg/dL (ref 0.40–1.20)
GFR: 53.89 mL/min — ABNORMAL LOW (ref >60.00)
Glucose, Bld: 94 mg/dL (ref 70–99)
Potassium: 4.1 mEq/L (ref 3.5–5.1)
Sodium: 142 mEq/L (ref 135–145)

## 2016-08-06 LAB — TSH: TSH: 2.04 u[IU]/mL (ref 0.35–4.50)

## 2016-08-06 MED ORDER — DICLOFENAC SODIUM 1 % TD GEL: 2.0000 g | Freq: Four times a day (QID) | TRANSDERMAL | 3 refills | Status: DC

## 2016-08-06 NOTE — Progress Notes (Signed)
Pre visit review using our clinic review tool, if applicable. No additional management support is needed unless otherwise documented below in the visit note. 

## 2016-08-06 NOTE — Patient Instructions (Addendum)
A few things to remember from today's visit:   Sinus tachycardia - Plan: EKG 12-Lead, TSH, Basic metabolic panel  Abnormal renal function test - Plan: Basic metabolic panel, Urine Microalbumin w/creat. ratio  Primary osteoarthritis involving multiple joints - Plan: diclofenac sodium (VOLTAREN) 1 % GEL  To preserve kidney function and/or slow progression of disease:  Low salt diet and adequate hydration. Avoiding medicines known as "nonsteroidal anti-inflammatory drugs," or NSAIDs. These medicines include ibuprofen (sample brand names: Advil, Motrin) and naproxen (sample brand name: Aleve).  Adequate blood pressure control.   Monitor pulse at home and send me readings in about 3-4 weeks. Depending of results and might need to continue back in a few months.  Please be sure medication list is accurate. If a new problem present, please set up appointment sooner than planned today.

## 2016-08-06 NOTE — Progress Notes (Signed)
HPI:   Jessica Casey is a 65 y.o. female, who is here today to establish care with me.  Former PCP: Dr Deliah Boston.  Last preventive routine visit: gyn visit 06/2016. Colonoscopy 2013, q 5 years.   She lives with husband. Independent ADL's and IADL's. No falls in the past year.  She follows a healthful diet, she does not exercise regularly.   History of depression, Dx in 1990. Currently she is on Wellbutrin and Lexapro, medications were started by psychiatrist but for the past 2 years she has been following with her gynecologist. Symptoms are well controlled. She denies history of bipolar disorder or hospitalizations due to psychiatry consultation. She denies suicidal thoughts.  FHx 4 out of 6 siblings with Hx of depression.  History of osteoporosis, currently she is on Fosamax, which she has taken for about 5 years or more.  During office visit today I noted mild tachycardia, she mentions that she "always" has had elevated heart rate. She is reporting a remote history of mitral valve prolapse.  Denies severe/frequent headache, visual changes, chest pain, dyspnea, palpitation, claudication, focal weakness, or edema.  Noted mildly abnormal e GFR 01/2016, it was worse a year ago. She denies any history of renal disease or hypertension. She has not noted gross hematuria or foam in the urine.  She denies frequent NSAID's OTC, has Hx of OA with arthralgias in IP of hands mainly. Pain is exacerbated by repetitive manual activities, she has mild IP deformities, she denies erythema. No limitation of ROM. Pain is alleviated by rest and some by OTC Tylenol.     Lab Results  Component Value Date   WBC 10.2 02/28/2016   HGB 11.8 (L) 02/28/2016   HCT 36.0 02/28/2016   MCV 98.1 02/28/2016   PLT 249.0 02/28/2016   Lab Results  Component Value Date   CREATININE 0.99 02/28/2016   BUN 17 02/28/2016   NA 141 02/28/2016   K 4.1 02/28/2016   CL 106 02/28/2016   CO2 30  02/28/2016    Concerns today: None    Review of Systems  Constitutional: Negative for activity change, appetite change, fatigue, fever and unexpected weight change.  HENT: Negative for mouth sores, nosebleeds and trouble swallowing.   Eyes: Negative for redness and visual disturbance.  Respiratory: Negative for cough, shortness of breath and wheezing.   Cardiovascular: Negative for chest pain, palpitations and leg swelling.  Gastrointestinal: Negative for abdominal pain, nausea and vomiting.       Negative for changes in bowel habits.  Genitourinary: Negative for decreased urine volume, difficulty urinating and hematuria.  Musculoskeletal: Positive for arthralgias. Negative for gait problem.  Neurological: Negative for syncope, weakness, numbness and headaches.  Psychiatric/Behavioral: Negative for confusion. The patient is not nervous/anxious.       Current Outpatient Prescriptions on File Prior to Visit  Medication Sig Dispense Refill  . alendronate (FOSAMAX) 70 MG tablet TAKE 1 TABLET EVERY SUNDAY. TAKE WITH A FULL GLASS OF  WATER ON AN EMPTY STOMACH 12 tablet 3  . aspirin 81 MG tablet Take 81 mg by mouth daily.      . Biotin 300 MCG TABS Take 300 mcg by mouth daily.      Marland Kitchen buPROPion (WELLBUTRIN XL) 300 MG 24 hr tablet Take 1 tablet (300 mg total) by mouth daily. 90 tablet 3  . Cholecalciferol (VITAMIN D) 2000 UNITS CAPS Take by mouth.    . conjugated estrogens (PREMARIN) vaginal cream Use pea size amount  to urethra twice a week at bedtime. 30 g 1  . escitalopram (LEXAPRO) 20 MG tablet Take 1 tablet (20 mg total) by mouth daily. 90 tablet 3  . Omega-3 Fatty Acids (FISH OIL PO) Take by mouth.     No current facility-administered medications on file prior to visit.      Past Medical History:  Diagnosis Date  . Colonic polyp 2005 & 2013  . DDD (degenerative disc disease)   . Depression   . H/O echocardiogram    before 2000, no need for f/u /w cardiac   . Heart murmur     MVP- echo- 2010, no symptoms   . Mononucleosis 1971  . Osteoporosis   . Pinched nerve in neck    Allergies  Allergen Reactions  . Gabapentin     REACTION: TOUNGE SWELLING, ITCHING    Family History  Problem Relation Age of Onset  . Diabetes Father   . Coronary artery disease Father     S/P CBAG , no MI -- Dec  . Depression Father   . Diverticulosis Father   . Breast cancer Mother   . Stroke Mother 90    Dec  . Breast cancer Sister   . Depression Sister   . Depression Sister   . Depression Sister   . Depression Sister   . Depression Brother   . Bipolar disorder Other     Neice  . Colon polyps Sister   . Colon cancer Neg Hx     Social History   Social History  . Marital status: Married    Spouse name: N/A  . Number of children: N/A  . Years of education: N/A   Occupational History  . Teacher    Social History Main Topics  . Smoking status: Never Smoker  . Smokeless tobacco: Never Used  . Alcohol use No  . Drug use: No  . Sexual activity: Yes    Partners: Male    Birth control/ protection: Post-menopausal   Other Topics Concern  . None   Social History Narrative  . None    Vitals:   08/06/16 1323  BP: 120/70  Pulse: (!) 123  Resp: 12  Temp: 98.4 F (36.9 C)   O2 sat at RA 96%  Body mass index is 25.52 kg/m.    Physical Exam  Nursing note and vitals reviewed. Constitutional: She is oriented to person, place, and time. She appears well-developed and well-nourished. No distress.  HENT:  Head: Atraumatic.  Mouth/Throat: Oropharynx is clear and moist and mucous membranes are normal.  Eyes: Conjunctivae and EOM are normal. Pupils are equal, round, and reactive to light.  Neck: No JVD present. No tracheal deviation present. No thyroid mass and no thyromegaly present.  Cardiovascular: Regular rhythm.  Tachycardia present.   No murmur heard. Pulses:      Dorsalis pedis pulses are 2+ on the right side, and 2+ on the left side.  Respiratory:  Effort normal and breath sounds normal. No respiratory distress.  GI: Soft. She exhibits no mass. There is no hepatomegaly. There is no tenderness.  Musculoskeletal: She exhibits no edema.  IP joint mild deformities, nodules on PIP and DIP. Mild limitation of flexion IP and wrist bilateral. No signs of synovitis.  Neurological: She is alert and oriented to person, place, and time. She has normal strength. Coordination and gait normal.  Skin: Skin is warm. No erythema.  Psychiatric: She has a normal mood and affect. Her speech is normal. Cognition and  memory are normal.  Well groomed, good eye contact.      ASSESSMENT AND PLAN:     Atley was seen today for establish care.  Diagnoses and all orders for this visit:   Sinus tachycardia  Possible causes discussed.  She prefers to hold on cardiologist evaluation. EKG done today: Sinus tachycardia, ? LAE, no LVH or signs of ischemia. Asymptomatic. Instructed about warning signs. Further recommendations would be given according to lab results and echocardiogram.  -     EKG 12-Lead -     TSH -     Basic metabolic panel -     ECHOCARDIOGRAM COMPLETE; Future  Abnormal renal function test  CKD III. no known risk factors as hypertension or DM 2. Avoid OTC NSAIDs, follow a low-salt diet.   Further recommendations would be given according to lab results.  -     Basic metabolic panel -     Urine Microalbumin w/creat. ratio  Primary osteoarthritis involving multiple joints  She agrees with trying topical NSAID. We may consider adding Cymbalta, in which case we may need to consider readjusting some of her medications for depression.  -     diclofenac sodium (VOLTAREN) 1 % GEL; Apply 2 g topically 4 (four) times daily.   Depression, major, in remission ([place])  Stable. She is currently following with her gynecologist. Not interested in changes in current management. Some side effects of SSRIs discussed, given her history of  osteoporosis.   -For now I recommend a year follow-up but explained that based on lab results I might need to bring her sooner.     [name]. [name], MD  [facility]. [place] office.

## 2016-08-07 ENCOUNTER — Telehealth: Payer: Self-pay

## 2016-08-07 NOTE — Telephone Encounter (Signed)
Received PA request from [facility] for Diclofenac Sodium gel. PA submitted & is pending. [id number]

## 2016-08-07 NOTE — Telephone Encounter (Signed)
PA approved, form faxed back to pharmacy. 

## 2016-08-08 ENCOUNTER — Ambulatory Visit: Payer: Self-pay | Admitting: Family Medicine

## 2016-08-15 ENCOUNTER — Ambulatory Visit: Payer: Self-pay | Admitting: Osteopathic Medicine

## 2016-09-06 ENCOUNTER — Other Ambulatory Visit: Payer: Self-pay | Admitting: Family Medicine

## 2016-09-06 DIAGNOSIS — R011 Cardiac murmur, unspecified: Secondary | ICD-10-CM

## 2016-09-06 DIAGNOSIS — Z8679 Personal history of other diseases of the circulatory system: Secondary | ICD-10-CM

## 2016-09-06 DIAGNOSIS — R000 Tachycardia, unspecified: Secondary | ICD-10-CM

## 2016-09-10 ENCOUNTER — Ambulatory Visit (INDEPENDENT_AMBULATORY_CARE_PROVIDER_SITE_OTHER): Payer: Medicare Other

## 2016-09-10 ENCOUNTER — Other Ambulatory Visit: Payer: Self-pay

## 2016-09-10 DIAGNOSIS — R000 Tachycardia, unspecified: Secondary | ICD-10-CM | POA: Diagnosis not present

## 2016-09-10 DIAGNOSIS — R011 Cardiac murmur, unspecified: Secondary | ICD-10-CM

## 2016-09-10 DIAGNOSIS — Z8679 Personal history of other diseases of the circulatory system: Secondary | ICD-10-CM | POA: Diagnosis not present

## 2016-09-18 ENCOUNTER — Encounter: Payer: Self-pay | Admitting: Family Medicine

## 2016-09-30 HISTORY — PX: COLONOSCOPY: SHX174

## 2017-01-12 NOTE — Progress Notes (Signed)
HPI:   ACUTE VISIT:  Chief Complaint  Patient presents with  . problems with sleeping    [name] is a 66 y.o. female, who is here today complaining of loud snoring, her husband of 40 years has been complaining of not been able to sleep because her snoring. Snoring is not affected by sleeping on the side, she has used nasal strips. She denies nasal congestion or stridor. She wears a dental guard because she grinds her teeth.  She has gained some weight since December 2017, attributed to increased calorie intake. She doesn't feel rested when she first gets up in the morning. She has difficulty falling asleep, she doesn't wake up once she is asleep.  + Fatigue,sleepy duirig the day for the past 2 years    Hx of depression, Dx in 1990, she is currently on Wellbutrin XL 300 mg and Lexapro.20 mg daily. She feels like her symptoms are well controlled. Working part time now.  Lab Results  Component Value Date   TSH 2.04 08/06/2016     Review of Systems  Constitutional: Positive for fatigue. Negative for activity change, appetite change and fever.  HENT: Negative for congestion, mouth sores, nosebleeds, postnasal drip, rhinorrhea, sore throat, trouble swallowing and voice change.   Respiratory: Negative for cough, shortness of breath and wheezing.   Cardiovascular: Negative for chest pain, palpitations and leg swelling.  Gastrointestinal: Negative for abdominal pain, nausea and vomiting.       Negative for changes in bowel habits.  Endocrine: Negative for cold intolerance and heat intolerance.  Genitourinary: Negative for decreased urine volume and hematuria.  Musculoskeletal: Negative for gait problem and myalgias.  Neurological: Negative for syncope, weakness and headaches.  Psychiatric/Behavioral: Positive for sleep disturbance. Negative for confusion and suicidal ideas. The patient is not nervous/anxious.       Current Outpatient Prescriptions on File  Prior to Visit  Medication Sig Dispense Refill  . alendronate (FOSAMAX) 70 MG tablet TAKE 1 TABLET EVERY SUNDAY. TAKE WITH A FULL GLASS OF  WATER ON AN EMPTY STOMACH 12 tablet 3  . aspirin 81 MG tablet Take 81 mg by mouth daily.      . Biotin 300 MCG TABS Take 300 mcg by mouth daily.      Marland Kitchen buPROPion (WELLBUTRIN XL) 300 MG 24 hr tablet Take 1 tablet (300 mg total) by mouth daily. 90 tablet 3  . Cholecalciferol (VITAMIN D) 2000 UNITS CAPS Take by mouth.    . conjugated estrogens (PREMARIN) vaginal cream Use pea size amount to urethra twice a week at bedtime. 30 g 1  . diclofenac sodium (VOLTAREN) 1 % GEL Apply 2 g topically 4 (four) times daily. 3 Tube 3  . escitalopram (LEXAPRO) 20 MG tablet Take 1 tablet (20 mg total) by mouth daily. 90 tablet 3  . Omega-3 Fatty Acids (FISH OIL PO) Take by mouth.     No current facility-administered medications on file prior to visit.      Past Medical History:  Diagnosis Date  . Colonic polyp 2005 & 2013  . DDD (degenerative disc disease)   . Depression   . H/O echocardiogram    before 2000, no need for f/u /w cardiac   . Heart murmur    MVP- echo- 2010, no symptoms   . Mononucleosis 1971  . Osteoporosis   . Pinched nerve in neck    Allergies  Allergen Reactions  . Gabapentin     REACTION: TOUNGE SWELLING, ITCHING  Social History   Social History  . Marital status: Married    Spouse name: N/A  . Number of children: N/A  . Years of education: N/A   Occupational History  . Teacher    Social History Main Topics  . Smoking status: Never Smoker  . Smokeless tobacco: Never Used  . Alcohol use No  . Drug use: No  . Sexual activity: Yes    Partners: Male    Birth control/ protection: Post-menopausal   Other Topics Concern  . None   Social History Narrative  . None    Vitals:   01/13/17 1208  BP: (!) 124/54  Pulse: 82  Resp: 12  Temp: 98.3 F (36.8 C)  O2 sat 97% at RA. Body mass index is 26.74 kg/m.  Physical Exam    Nursing note and vitals reviewed. Constitutional: She is oriented to person, place, and time. She appears well-developed and well-nourished. No distress.  HENT:  Head: Atraumatic.  Mouth/Throat: Uvula is midline, oropharynx is clear and moist and mucous membranes are normal.  Eyes: Conjunctivae and EOM are normal.  Neck: No tracheal deviation present. No thyroid mass and no thyromegaly present.  Cardiovascular: Normal rate and regular rhythm.   No murmur heard. Pulses:      Dorsalis pedis pulses are 2+ on the right side, and 2+ on the left side.  Respiratory: Effort normal and breath sounds normal. No respiratory distress.  Musculoskeletal: She exhibits edema (Trace pitting edema LE,bilateral). She exhibits no tenderness.  Lymphadenopathy:    She has no cervical adenopathy.  Neurological: She is alert and oriented to person, place, and time. She has normal strength. Coordination and gait normal.  Skin: Skin is warm. No erythema.  Psychiatric: She has a normal mood and affect.  Well groomed, good eye contact.     ASSESSMENT AND PLAN:   Erick was seen today for problems with sleeping.  Diagnoses and all orders for this visit:  Daytime sleepiness  Adequate sleep hygiene. Referral to pulmonology was placed today to evaluate for OSA.   -     Ambulatory referral to Pulmonology  Snoring  Explained that the snoring is not always pathologic. She has tried nasal strips and sleeping on her side,not helping. OSA to be ruled out. We discussed other treatment options, including dental devices, ENT surgical procedures among some.  Fatigue, unspecified type  Possible causes discussed. Insomnia and depression could be aggravating factors.    -[name] advised to return or notify a doctor immediately if symptoms worsen or new concerns arise.       Genean Adamski G. [name], MD  [facility] [place] office.

## 2017-01-13 ENCOUNTER — Encounter: Payer: Self-pay | Admitting: Family Medicine

## 2017-01-13 ENCOUNTER — Ambulatory Visit (INDEPENDENT_AMBULATORY_CARE_PROVIDER_SITE_OTHER): Payer: Medicare Other | Admitting: Family Medicine

## 2017-01-13 VITALS — BP 124/54 | HR 82 | Temp 98.3°F | Resp 12 | Ht 59.0 in | Wt 132.4 lb

## 2017-01-13 DIAGNOSIS — R400 Somnolence: Secondary | ICD-10-CM | POA: Diagnosis not present

## 2017-01-13 DIAGNOSIS — R5383 Other fatigue: Secondary | ICD-10-CM | POA: Diagnosis not present

## 2017-01-13 DIAGNOSIS — R0683 Snoring: Secondary | ICD-10-CM

## 2017-01-13 NOTE — Patient Instructions (Addendum)
WE NOW OFFER   [facility] FAST TRACK!!!  SAME DAY Appointments for ACUTE CARE  Such as: Sprains, Injuries, cuts, abrasions, rashes, muscle pain, joint pain, back pain Colds, flu, sore throats, headache, allergies, cough, fever  Ear pain, sinus and eye infections Abdominal pain, nausea, vomiting, diarrhea, upset stomach Animal/insect bites  3 Easy Ways to Schedule: Walk-In Scheduling Call in scheduling Mychart Sign-up: [email or web address]   A few things to remember from today's visit:   Daytime sleepiness - Plan: Ambulatory referral to Pulmonology   Please be sure medication list is accurate. If a new problem present, please set up appointment sooner than planned today.

## 2017-01-13 NOTE — Progress Notes (Signed)
Pre visit review using our clinic review tool, if applicable. No additional management support is needed unless otherwise documented below in the visit note. 

## 2017-01-21 ENCOUNTER — Encounter: Payer: Self-pay | Admitting: Pulmonary Disease

## 2017-01-21 ENCOUNTER — Ambulatory Visit (INDEPENDENT_AMBULATORY_CARE_PROVIDER_SITE_OTHER): Payer: Medicare Other | Admitting: Pulmonary Disease

## 2017-01-21 VITALS — BP 122/70 | HR 95 | Ht 59.0 in | Wt 135.3 lb

## 2017-01-21 DIAGNOSIS — R0683 Snoring: Secondary | ICD-10-CM | POA: Diagnosis not present

## 2017-01-21 NOTE — Patient Instructions (Signed)
Will arrange for home sleep study Will call to arrange for follow up after sleep study reviewed  

## 2017-01-21 NOTE — Progress Notes (Signed)
   Subjective:    Patient ID: Jessica Casey, female    DOB: [birth year], 66 y.o.   MRN: [id number]  HPI    Review of Systems  Constitutional: Positive for unexpected weight change. Negative for fever.  HENT: Negative for congestion, dental problem, ear pain, nosebleeds, postnasal drip, rhinorrhea, sinus pressure, sneezing, sore throat and trouble swallowing.   Eyes: Negative for redness and itching.  Respiratory: Negative for cough, chest tightness, shortness of breath and wheezing.   Cardiovascular: Negative for palpitations and leg swelling.  Gastrointestinal: Negative for nausea and vomiting.       Acid heartburn  Genitourinary: Negative for dysuria.  Musculoskeletal: Positive for arthralgias and joint swelling.  Skin: Negative for rash.  Neurological: Negative for headaches.  Hematological: Does not bruise/bleed easily.  Psychiatric/Behavioral: Positive for dysphoric mood. The patient is not nervous/anxious.        Objective:   Physical Exam        Assessment & Plan:

## 2017-01-21 NOTE — Progress Notes (Signed)
Past Surgical History She  has a past surgical history that includes Umbilical hernia repair; Cervical fusion; Lumbar disc surgery; Rotator cuff repair; colonoscopy with polypectomy (2013); Knee surgery; Carpal tunnel release (Right); Cataract extraction, bilateral (2013); Hernia repair; Fracture surgery; Wrist fracture surgery (Left, 02/2013); and Anterior cervical decomp/discectomy fusion (N/A, 05/20/2013).  Allergies  Allergen Reactions  . Gabapentin     REACTION: TOUNGE SWELLING, ITCHING    Family History Her family history includes Bipolar disorder in her other; Breast cancer in her mother and sister; Colon polyps in her sister; Coronary artery disease in her father; Depression in her brother, father, sister, sister, sister, and sister; Diabetes in her father; Diverticulosis in her father; Stroke (age of onset: 42) in her mother.  Social History She  reports that she has never smoked. She has never used smokeless tobacco. She reports that she does not drink alcohol or use drugs.  Review of systems Constitutional: Positive for unexpected weight change. Negative for fever.  HENT: Negative for congestion, dental problem, ear pain, nosebleeds, postnasal drip, rhinorrhea, sinus pressure, sneezing, sore throat and trouble swallowing.   Eyes: Negative for redness and itching.  Respiratory: Negative for cough, chest tightness, shortness of breath and wheezing.   Cardiovascular: Negative for palpitations and leg swelling.  Gastrointestinal: Negative for nausea and vomiting.       Acid heartburn  Genitourinary: Negative for dysuria.  Musculoskeletal: Positive for arthralgias and joint swelling.  Skin: Negative for rash.  Neurological: Negative for headaches.  Hematological: Does not bruise/bleed easily.  Psychiatric/Behavioral: Positive for dysphoric mood. The patient is not nervous/anxious.     Current Outpatient Prescriptions on File Prior to Visit  Medication Sig  . alendronate (FOSAMAX)  70 MG tablet TAKE 1 TABLET EVERY SUNDAY. TAKE WITH A FULL GLASS OF  WATER ON AN EMPTY STOMACH  . aspirin 81 MG tablet Take 81 mg by mouth daily.    . Biotin 300 MCG TABS Take 300 mcg by mouth daily.    Marland Kitchen buPROPion (WELLBUTRIN XL) 300 MG 24 hr tablet Take 1 tablet (300 mg total) by mouth daily.  . Cholecalciferol (VITAMIN D) 2000 UNITS CAPS Take by mouth.  . conjugated estrogens (PREMARIN) vaginal cream Use pea size amount to urethra twice a week at bedtime.  . diclofenac sodium (VOLTAREN) 1 % GEL Apply 2 g topically 4 (four) times daily.  Marland Kitchen escitalopram (LEXAPRO) 20 MG tablet Take 1 tablet (20 mg total) by mouth daily.  . Omega-3 Fatty Acids (FISH OIL PO) Take by mouth.   No current facility-administered medications on file prior to visit.     Chief Complaint  Patient presents with  . SLEEP CONSULT    Referred by Dr [name]. Epworth Score: 9    Past medical history She  has a past medical history of Colonic polyp (2005 & 2013); DDD (degenerative disc disease); Depression; H/O echocardiogram; Heart murmur; Mononucleosis (1971); Osteoporosis; and Pinched nerve in neck.  Vital signs BP 122/70 (BP Location: Left Arm, Cuff Size: Normal)   Pulse 95   Ht 4\' 11"  (1.499 m)   Wt 135 lb 4.8 oz (61.4 kg)   LMP 10/01/1995 (Approximate)   SpO2 99%   BMI 27.33 kg/m   History of Present Illness QUETZALLI CLOS is a 66 y.o. female for evaluation of sleep problems.  Her husband has been concerned about her snoring.  This is getting louder.  He has to push her on her side so her breathing will pick up.  She doesn't  dream.  She can fall asleep in the morning after getting her grandson to school.  She goes to sleep at 11 pm.  She falls asleep after 20 minutes.  She wakes up 2 times to use the bathroom.  She gets out of bed at 715 am on weekdays, but sleeps in until 9 am on weekends.  She feels okay in the morning.  She denies morning headache.  She does not use anything to help her fall sleep or stay  awake.  She denies sleep walking, sleep talking, bruxism, or nightmares.  There is no history of restless legs.  She denies sleep hallucinations, sleep paralysis, or cataplexy.  The Epworth score is 9 out of 24.   Physical Exam:  General - No distress ENT - No sinus tenderness, no oral exudate, no LAN, no thyromegaly, TM clear, pupils equal/reactive, MP 2 Cardiac - s1s2 regular, no murmur, pulses symmetric Chest - No wheeze/rales/dullness, good air entry, normal respiratory excursion Back - No focal tenderness Abd - Soft, non-tender, no organomegaly, + bowel sounds Ext - No edema Neuro - Normal strength, cranial nerves intact Skin - No rashes Psych - Normal mood, and behavior  Discussion: She has snoring, sleep disruption, daytime sleepiness, and witnessed apnea.  She has a history of depression.  I am concerned she could have sleep apnea.  We discussed how sleep apnea can affect various health problems, including risks for hypertension, cardiovascular disease, and diabetes.  We also discussed how sleep disruption can increase risks for accidents, such as while driving.  Weight loss as a means of improving sleep apnea was also reviewed.  Additional treatment options discussed were CPAP therapy, oral appliance, and surgical intervention.  Assessment/plan:  Snoring with concern for obstructive sleep apnea. - will arrange for home sleep study - she might be a good candidate for oral appliance   Patient Instructions  Will arrange for home sleep study Will call to arrange for follow up after sleep study reviewed    Chesley Mires, M.D. Pager [phone] 01/21/2017, 10:45 AM

## 2017-02-03 DIAGNOSIS — G4733 Obstructive sleep apnea (adult) (pediatric): Secondary | ICD-10-CM | POA: Diagnosis not present

## 2017-02-04 ENCOUNTER — Telehealth: Payer: Self-pay | Admitting: Pulmonary Disease

## 2017-02-04 DIAGNOSIS — G4733 Obstructive sleep apnea (adult) (pediatric): Secondary | ICD-10-CM

## 2017-02-04 NOTE — Telephone Encounter (Signed)
HST 02/03/17 >> AHI 16.1, SaO2 low 83%   Will have my nurse inform pt that sleep study shows moderate sleep apnea.  Options are 1) CPAP now, 2) ROV first.  If She is agreeable to CPAP, then please send order for auto CPAP range 5 to 15 cm H2O with heated humidity and mask of choice.  Have download sent 1 month after starting CPAP and set up ROV 2 months after starting CPAP.  ROV can be with me or NP.

## 2017-02-05 DIAGNOSIS — G4733 Obstructive sleep apnea (adult) (pediatric): Secondary | ICD-10-CM | POA: Diagnosis not present

## 2017-02-06 ENCOUNTER — Other Ambulatory Visit: Payer: Self-pay | Admitting: *Deleted

## 2017-02-06 DIAGNOSIS — R0683 Snoring: Secondary | ICD-10-CM

## 2017-02-14 NOTE — Telephone Encounter (Signed)
Results have been explained to patient, pt expressed understanding.  Order placed for CPAP. Appt scheduled with Dr Halford Chessman on  05/20/17 at 345. Nothing further needed.

## 2017-04-07 ENCOUNTER — Telehealth: Payer: Self-pay | Admitting: Family Medicine

## 2017-04-07 DIAGNOSIS — H9190 Unspecified hearing loss, unspecified ear: Secondary | ICD-10-CM

## 2017-04-07 NOTE — Telephone Encounter (Signed)
Pt would like to know who dr [name] wil refer her to for a hearing test.

## 2017-04-07 NOTE — Telephone Encounter (Signed)
Referral placed.

## 2017-04-07 NOTE — Telephone Encounter (Signed)
It is Ok to place referral for audiologists. I assumed she is having hearing loss, so this can be used as Dx.  Thanks, BJ

## 2017-04-09 ENCOUNTER — Ambulatory Visit (INDEPENDENT_AMBULATORY_CARE_PROVIDER_SITE_OTHER)
Admission: RE | Admit: 2017-04-09 | Discharge: 2017-04-09 | Disposition: A | Discharge status: Home / Self Care | Payer: Medicare Other | Source: Ambulatory Visit | Attending: Family Medicine | Admitting: Family Medicine

## 2017-04-09 ENCOUNTER — Ambulatory Visit (INDEPENDENT_AMBULATORY_CARE_PROVIDER_SITE_OTHER): Payer: Medicare Other | Admitting: Family Medicine

## 2017-04-09 ENCOUNTER — Encounter: Payer: Self-pay | Admitting: Family Medicine

## 2017-04-09 VITALS — BP 110/70 | HR 84 | Resp 12 | Wt 136.4 lb

## 2017-04-09 DIAGNOSIS — M25531 Pain in right wrist: Secondary | ICD-10-CM | POA: Diagnosis not present

## 2017-04-09 MED ORDER — CELECOXIB 100 MG PO CAPS: 100.0000 mg | ORAL_CAPSULE | Freq: Two times a day (BID) | ORAL | 0 refills | Status: AC

## 2017-04-09 MED ORDER — CELECOXIB 100 MG PO CAPS: 100.0000 mg | ORAL_CAPSULE | Freq: Two times a day (BID) | ORAL | 0 refills | Status: DC

## 2017-04-09 NOTE — Patient Instructions (Signed)
A few things to remember from today's visit:   Acute wrist pain, right - Plan: DG Wrist Complete Right, celecoxib (CELEBREX) 100 MG capsule  Local ice, Celebrex. If not better in 4-6 weeks or it gets worse hand specialists to be considered.  Today X ray was ordered.  This can be done at [facility] between 8 am and 5 pm: [place]. [phone].   Please be sure medication list is accurate. If a new problem present, please set up appointment sooner than planned today.

## 2017-04-09 NOTE — Progress Notes (Signed)
ACUTE VISIT   HPI:  Chief Complaint  Patient presents with  . Hand Pain    Jessica Casey is a 66 y.o. female, who is here today complaining of 10 days of right hand edema and pain. She has not had any injury. Denies prior Hx of hand pain.  Pain is mild achy most of the time but if she picks up something it is sharp and severe. No limitation of ROM. Exacerbated by certain activities that require repetitive hand movement, almost dropping objects.   Hand Pain   The incident occurred more than 1 week ago. There was no injury mechanism. The pain is present in the right hand and right wrist. The quality of the pain is described as aching (sharp). The pain does not radiate. The pain is moderate. The pain has been intermittent since the incident. Pertinent negatives include no chest pain, muscle weakness, numbness or tingling. The symptoms are aggravated by movement and lifting. She has tried rest and acetaminophen for the symptoms. The treatment provided no relief.   She has Hx of carpal tunnel synd same hand, s/p surgery. Denies local erythema, weakness, or other joint pian on RUE.  She has taken Tylenol as needed.  Right handed. Problem is stable.   Review of Systems  Constitutional: Negative for appetite change, chills, fatigue and fever.  HENT: Negative for sore throat and trouble swallowing.   Respiratory: Negative for chest tightness, shortness of breath and wheezing.   Cardiovascular: Negative for chest pain and leg swelling.  Musculoskeletal: Positive for arthralgias. Negative for gait problem and neck pain.  Skin: Negative for color change and rash.  Neurological: Negative for tingling, weakness and numbness.  Psychiatric/Behavioral: Negative for confusion and sleep disturbance.      Current Outpatient Prescriptions on File Prior to Visit  Medication Sig Dispense Refill  . alendronate (FOSAMAX) 70 MG tablet TAKE 1 TABLET EVERY SUNDAY. TAKE WITH A FULL  GLASS OF  WATER ON AN EMPTY STOMACH 12 tablet 3  . aspirin 81 MG tablet Take 81 mg by mouth daily.      . Biotin 300 MCG TABS Take 300 mcg by mouth daily.      Marland Kitchen buPROPion (WELLBUTRIN XL) 300 MG 24 hr tablet Take 1 tablet (300 mg total) by mouth daily. 90 tablet 3  . Cholecalciferol (VITAMIN D) 2000 UNITS CAPS Take by mouth.    . conjugated estrogens (PREMARIN) vaginal cream Use pea size amount to urethra twice a week at bedtime. 30 g 1  . diclofenac sodium (VOLTAREN) 1 % GEL Apply 2 g topically 4 (four) times daily. 3 Tube 3  . escitalopram (LEXAPRO) 20 MG tablet Take 1 tablet (20 mg total) by mouth daily. 90 tablet 3  . Omega-3 Fatty Acids (FISH OIL PO) Take by mouth.     No current facility-administered medications on file prior to visit.      Past Medical History:  Diagnosis Date  . Colonic polyp 2005 & 2013  . DDD (degenerative disc disease)   . Depression   . H/O echocardiogram    before 2000, no need for f/u /w cardiac   . Heart murmur    MVP- echo- 2010, no symptoms   . Mononucleosis 1971  . Osteoporosis   . Pinched nerve in neck    Allergies  Allergen Reactions  . Gabapentin     REACTION: TOUNGE SWELLING, ITCHING    Social History   Social History  . Marital status:  Married    Spouse name: N/A  . Number of children: N/A  . Years of education: N/A   Occupational History  . Teacher    Social History Main Topics  . Smoking status: Never Smoker  . Smokeless tobacco: Never Used  . Alcohol use No  . Drug use: No  . Sexual activity: Yes    Partners: Male    Birth control/ protection: Post-menopausal   Other Topics Concern  . None   Social History Narrative  . None    Vitals:   04/09/17 1640  BP: 110/70  Pulse: 84  Resp: 12   Body mass index is 27.54 kg/m.   Physical Exam  Nursing note and vitals reviewed. Constitutional: She is oriented to person, place, and time. She appears well-developed. She does not appear ill. No distress.  HENT:    Head: Atraumatic.  Eyes: Conjunctivae are normal.  Cardiovascular: Normal rate and regular rhythm.   Pulses:      Radial pulses are 2+ on the right side.  Respiratory: Effort normal. No respiratory distress.  Musculoskeletal: She exhibits edema and tenderness.  Right hand: Pain upon palpation of thenar aspect, mainly when pressing scaphoid. No significant deformity on affected area.Edema on volar radial  aspect of wrist. No pain with flexion or extension. No limitation of wrist ROM. No tenderness upon palpation of radial styloid, no  erythema appreciated. No pain elicited on radial styloid with Finkelstein maneuver.   DIP and PIP nodular deformities.  Neurological: She is alert and oriented to person, place, and time. She has normal strength. Gait normal.  Skin: Skin is warm. No rash noted. No erythema.  Psychiatric: She has a normal mood and affect. Her speech is normal.  Well groomed, good eye contact.      ASSESSMENT AND PLAN:   Ms. Jessica Casey was here today for right hand/wrist pain.  Diagnoses and all orders for this visit:  Acute wrist pain, right  ? Tendinitis, OA among some possible causes dicussed. Because acute and tenderness upon palpation of carpal bones, X ray was recommended. Local ice. Celebrex side effects discussed, recommend taking for 2 weeks. If not better referral will be consider, she would like to see Dr Rica Mote (performed her carpal tunnel surgery) if referral is necessary.   -     DG Wrist Complete Right; Future -     celecoxib (CELEBREX) 100 MG capsule; Take 1 capsule (100 mg total) by mouth 2 (two) times daily. -     DG Hand Complete Right; Future     -[name] was advised to seek immediate medical attention if sudden worsening symptoms, otherwise she will let me know in 2-3 weeks if she is still having pain.        Denim Kalmbach G. [name], MD  [facility]. [place] office.

## 2017-04-10 ENCOUNTER — Encounter: Payer: Self-pay | Admitting: Family Medicine

## 2017-04-17 ENCOUNTER — Other Ambulatory Visit: Payer: Self-pay | Admitting: Family Medicine

## 2017-04-17 DIAGNOSIS — M79641 Pain in right hand: Secondary | ICD-10-CM

## 2017-04-29 LAB — HM MAMMOGRAPHY

## 2017-05-20 ENCOUNTER — Ambulatory Visit (INDEPENDENT_AMBULATORY_CARE_PROVIDER_SITE_OTHER): Payer: Medicare Other | Admitting: Pulmonary Disease

## 2017-05-20 ENCOUNTER — Encounter: Payer: Self-pay | Admitting: Pulmonary Disease

## 2017-05-20 VITALS — BP 108/60 | HR 88 | Ht 60.0 in | Wt 134.0 lb

## 2017-05-20 DIAGNOSIS — G4733 Obstructive sleep apnea (adult) (pediatric): Secondary | ICD-10-CM | POA: Diagnosis not present

## 2017-05-20 NOTE — Patient Instructions (Signed)
Follow up in 1 year.

## 2017-05-20 NOTE — Progress Notes (Signed)
Current Outpatient Prescriptions on File Prior to Visit  Medication Sig  . alendronate (FOSAMAX) 70 MG tablet TAKE 1 TABLET EVERY SUNDAY. TAKE WITH A FULL GLASS OF  WATER ON AN EMPTY STOMACH  . aspirin 81 MG tablet Take 81 mg by mouth daily.    . Biotin 300 MCG TABS Take 300 mcg by mouth daily.    Marland Kitchen buPROPion (WELLBUTRIN XL) 300 MG 24 hr tablet Take 1 tablet (300 mg total) by mouth daily.  . Cholecalciferol (VITAMIN D) 2000 UNITS CAPS Take by mouth.  . conjugated estrogens (PREMARIN) vaginal cream Use pea size amount to urethra twice a week at bedtime.  . diclofenac sodium (VOLTAREN) 1 % GEL Apply 2 g topically 4 (four) times daily.  Marland Kitchen escitalopram (LEXAPRO) 20 MG tablet Take 1 tablet (20 mg total) by mouth daily.  . Omega-3 Fatty Acids (FISH OIL PO) Take by mouth.   No current facility-administered medications on file prior to visit.      Chief Complaint  Patient presents with  . Follow-up    Pt states that she has been doing well on her CPAP machine. Pt states that she takes machine off sometimes at night and is not aware she is doing it. States that she is not snoring anymore at night.     Sleep tests HST 02/03/17 >> AHI 16.1, SaO2 low 83%  Past medical history Depression, Osteoporosis  Past surgical history, Family history, Social history, Allergies all reviewed.  Vital Signs BP 108/60 (BP Location: Left Arm, Patient Position: Sitting, Cuff Size: Normal)   Pulse 88   Ht 5' (1.524 m)   Wt 134 lb (60.8 kg)   LMP 10/01/1995 (Approximate)   SpO2 95%   BMI 26.17 kg/m   History of Present Illness Jessica Casey is a 66 y.o. female with obstructive sleep apnea.  She her last visit she had home sleep study.  Showed moderate OSA.  She was started on CPAP.  Uses nasal mask.  No issue with mask fit.  She is surprised how much better she is sleeping.  She is more energetic during the day.  Her husband is gland she is no longer snoring at night.  Physical Exam  General - No  distress ENT - No sinus tenderness, no oral exudate, no LAN, MP 2 Cardiac - s1s2 regular, no murmur Chest - No wheeze/rales/dullness Back - No focal tenderness Abd - Soft, non-tender Ext - No edema Neuro - Normal strength Skin - No rashes Psych - normal mood, and behavior   Assessment/Plan  Obstructive sleep apnea. - We discussed how sleep apnea can affect various health problems, including risks for hypertension, cardiovascular disease, and diabetes.  We also discussed how sleep disruption can increase risks for accidents, such as while driving.  Weight loss as a means of improving sleep apnea was also reviewed.  Additional treatment options discussed were CPAP therapy, oral appliance, and surgical intervention. - she is compliant with CPAP and reports benefit from therapy - continue auto CPAP   Patient Instructions  Follow up in 1 year    Chesley Mires, MD [facility] Pager:  [phone] 05/20/2017, 4:34 PM

## 2017-05-23 ENCOUNTER — Other Ambulatory Visit: Payer: Self-pay | Admitting: Obstetrics and Gynecology

## 2017-05-26 NOTE — Telephone Encounter (Signed)
eScribe request from [facility] for refill on ALENDRONATE 70 MG Last filled - 07/05/16, #12 X 3 RF Last AEX - 07/05/16 Next AEX - 07/11/17 Last MMG - 04/22/16, 3D, Bi-Rads 1:  Negative, repeat in one year  Please advise refills. Thank you.

## 2017-06-08 ENCOUNTER — Other Ambulatory Visit: Payer: Self-pay | Admitting: Obstetrics and Gynecology

## 2017-06-09 NOTE — Telephone Encounter (Signed)
Medication refill request: Lexapro and Wellbutrin  Last AEX:  07-05-16  Next AEX: 07-11-17  Last MMG (if hormonal medication request): [id number]  Refill authorized: please advise

## 2017-06-18 ENCOUNTER — Other Ambulatory Visit: Payer: Self-pay | Admitting: Family Medicine

## 2017-06-18 ENCOUNTER — Encounter: Payer: Self-pay | Admitting: Family Medicine

## 2017-06-18 ENCOUNTER — Encounter: Payer: Self-pay | Admitting: Gastroenterology

## 2017-06-18 DIAGNOSIS — Z1211 Encounter for screening for malignant neoplasm of colon: Secondary | ICD-10-CM

## 2017-06-19 ENCOUNTER — Encounter: Payer: Self-pay | Admitting: Family Medicine

## 2017-07-08 ENCOUNTER — Ambulatory Visit (AMBULATORY_SURGERY_CENTER): Payer: Self-pay

## 2017-07-08 ENCOUNTER — Encounter: Payer: Self-pay | Admitting: Gastroenterology

## 2017-07-08 VITALS — Ht 59.0 in | Wt 137.4 lb

## 2017-07-08 DIAGNOSIS — Z86010 Personal history of colonic polyps: Secondary | ICD-10-CM

## 2017-07-08 MED ORDER — NA SULFATE-K SULFATE-MG SULF 17.5-3.13-1.6 GM/177ML PO SOLN: 1.0000 | Freq: Once | ORAL | 0 refills | Status: AC

## 2017-07-08 NOTE — Progress Notes (Signed)
Denies allergies to eggs or soy products. Denies complication of anesthesia or sedation. Denies use of weight loss medication. Denies use of O2.   Emmi instructions declined.  

## 2017-07-11 ENCOUNTER — Ambulatory Visit (INDEPENDENT_AMBULATORY_CARE_PROVIDER_SITE_OTHER): Payer: Medicare Other | Admitting: Obstetrics and Gynecology

## 2017-07-11 ENCOUNTER — Encounter: Payer: Self-pay | Admitting: Obstetrics and Gynecology

## 2017-07-11 VITALS — BP 104/60 | HR 76 | Resp 14 | Ht 58.5 in | Wt 140.0 lb

## 2017-07-11 DIAGNOSIS — Z01419 Encounter for gynecological examination (general) (routine) without abnormal findings: Secondary | ICD-10-CM

## 2017-07-11 MED ORDER — ALENDRONATE SODIUM 70 MG PO TABS: ORAL_TABLET | ORAL | 3 refills | Status: DC

## 2017-07-11 MED ORDER — ESCITALOPRAM OXALATE 20 MG PO TABS: 20.0000 mg | ORAL_TABLET | Freq: Every day | ORAL | 3 refills | Status: DC

## 2017-07-11 MED ORDER — BUPROPION HCL ER (XL) 300 MG PO TB24: 300.0000 mg | ORAL_TABLET | Freq: Every day | ORAL | 3 refills | Status: DC

## 2017-07-11 NOTE — Progress Notes (Signed)
66 y.o. G54P2002 Married Caucasian female here for annual exam.    No vaginal bleeding.   No further UTIs.  ROS - new dx of sleep apnea, some arthritis, hearing loss, and weight gain.  Wants to loose weight. Just can't stop eating.   PCP:   Betty [name], MD  Patient's last menstrual period was 10/01/1995 (approximate).           Sexually active: Yes.    The current method of family planning is post menopausal status.    Exercising: No.   Smoker:  no  Health Maintenance: Pap: 07-05-16 Neg,  05/14/13 Neg. Neg HR HPV History of abnormal Pap:  no MMG: 04-29-17 3D Density B/Neg/BiRads1:[facility] Colonoscopy: Scheduled next week with [facility]: 08-07-15 Result  Osteopenia, Hx of Osteoporosis with Fosamax treatment. TDaP:  06-08-08 Td Gardasil:   no DJM:EQASTM Hep C:unsure Screening Labs:  Hb today: PCP, Urine today: not done   reports that she has never smoked. She has never used smokeless tobacco. She reports that she does not drink alcohol or use drugs.  Past Medical History:  Diagnosis Date  . Anxiety   . Cataract   . Colonic polyp 2005 & 2013  . DDD (degenerative disc disease)   . Depression   . GERD (gastroesophageal reflux disease)   . H/O echocardiogram    before 2000, no need for f/u /w cardiac   . Heart murmur    MVP- echo- 2010, no symptoms   . Mononucleosis 1971  . Osteoporosis   . Pinched nerve in neck   . Sleep apnea     Past Surgical History:  Procedure Laterality Date  . ANTERIOR CERVICAL DECOMP/DISCECTOMY FUSION N/A 05/20/2013   Procedure: ANTERIOR CERVICAL DECOMPRESSION/DISCECTOMY FUSION 2 LEVELS;  Surgeon: Erline Levine, MD;  Location: [facility];  Service: Neurosurgery;  Laterality: N/A;  Cervical Seven-Thoracic One, Thoracic One-Two Anterior cervical decompression/Diskectomy/Fusion  . CARPAL TUNNEL RELEASE Right   . CATARACT EXTRACTION, BILATERAL  2013   Dr Gershon Crane, [facility] IOL  . CERVICAL FUSION     2 proceduresr Vertell Limber  . colonoscopy with polypectomy   2013   Dr Deatra Ina  . FRACTURE SURGERY     L wrist - June 2014  . HERNIA REPAIR    . KNEE SURGERY     Dr Wonda Olds ; bursa cystectomy  . LUMBAR DISC SURGERY     Dr.Stern  . ROTATOR CUFF REPAIR     Dr.Wainer  . UMBILICAL HERNIA REPAIR    . WRIST FRACTURE SURGERY Left 02/2013   Dr. Kathryne Hitch    Current Outpatient Prescriptions  Medication Sig Dispense Refill  . alendronate (FOSAMAX) 70 MG tablet TAKE 1 TABLET EVERY SUNDAY. TAKE WITH A FULL GLASS OF  WATER ON AN EMPTY STOMACH 12 tablet 0  . aspirin 81 MG tablet Take 81 mg by mouth daily.      . Biotin 300 MCG TABS Take 300 mcg by mouth daily.      Marland Kitchen buPROPion (WELLBUTRIN XL) 300 MG 24 hr tablet TAKE 1 TABLET BY MOUTH  DAILY 90 tablet 0  . Cholecalciferol (VITAMIN D) 2000 UNITS CAPS Take by mouth.    . conjugated estrogens (PREMARIN) vaginal cream Use pea size amount to urethra twice a week at bedtime. 30 g 1  . diclofenac sodium (VOLTAREN) 1 % GEL Apply 2 g topically 4 (four) times daily. 3 Tube 3  . escitalopram (LEXAPRO) 20 MG tablet TAKE 1 TABLET BY MOUTH  DAILY 90 tablet 0  .  Omega-3 Fatty Acids (FISH OIL PO) Take by mouth.     No current facility-administered medications for this visit.     Family History  Problem Relation Age of Onset  . Diabetes Father   . Coronary artery disease Father        S/P CBAG , no MI -- Dec  . Depression Father   . Diverticulosis Father   . Breast cancer Mother        estorgen receptor positive  . Stroke Mother 47       Dec  . Breast cancer Sister        estrogen receptor positive  . Depression Sister   . Depression Sister   . Depression Sister   . Depression Sister   . Depression Brother   . Bipolar disorder Other        Neice  . Colon polyps Sister   . Colon cancer Neg Hx   . Esophageal cancer Neg Hx   . Pancreatic cancer Neg Hx   . Rectal cancer Neg Hx   . Stomach cancer Neg Hx     ROS:  Pertinent items are noted in HPI.  Otherwise, a comprehensive ROS was negative.  Exam:    BP 104/60 (BP Location: Right Arm, Patient Position: Sitting, Cuff Size: Normal)   Pulse 76   Resp 14   Ht 4' 10.5" (1.486 m)   Wt 140 lb (63.5 kg)   LMP 10/01/1995 (Approximate)   BMI 28.76 kg/m     General appearance: alert, cooperative and appears stated age Head: Normocephalic, without obvious abnormality, atraumatic Neck: no adenopathy, supple, symmetrical, trachea midline and thyroid normal to inspection and palpation Lungs: clear to auscultation bilaterally Breasts: normal appearance, no masses or tenderness, No nipple retraction or dimpling, No nipple discharge or bleeding, No axillary or supraclavicular adenopathy Heart: regular rate and rhythm Abdomen: soft, non-tender; no masses, no organomegaly Extremities: extremities normal, atraumatic, no cyanosis or edema Skin: Skin color, texture, turgor normal. No rashes or lesions Lymph nodes: Cervical, supraclavicular, and axillary nodes normal. No abnormal inguinal nodes palpated Neurologic: Grossly normal  Pelvic: External genitalia:  no lesions              Urethra:  normal appearing urethra with no masses, tenderness or lesions              Bartholins and Skenes: normal                 Vagina: normal appearing vagina with normal color and discharge, no lesions              Cervix: no lesions              Pap taken: No. Bimanual Exam:  Uterus:  normal size, contour, position, consistency, mobility, non-tender              Adnexa: no mass, fullness, tenderness              Rectal exam: Yes.  .  Confirms.              Anus:  normal sphincter tone, no lesions  Chaperone was present for exam.  Assessment:   Well woman visit with normal exam. FH of breast cancer. BRCA negative.  Hx UTIs. Osteoporosis.  Depression. Sleep apnea.  Using CPAP.  Plan: Mammogram screening discussed. Recommended self breast awareness. Pap and HR HPV as above. Guidelines for Calcium, Vitamin D, regular exercise program including  cardiovascular and weight bearing exercise.  We talked about reduction of calories through small changes daily.  Refill of Lexapro, Wellbutrin, and Fosamax for one year. No refill of Vaginal estrogen cream at this time.  I did discuss potential increased risk of breast cancer.  She will try coconut oil. Labs and vaccines with PCP.  Will order BMD.  Follow up annually and prn.    After visit summary provided.

## 2017-07-11 NOTE — Patient Instructions (Signed)

## 2017-07-15 ENCOUNTER — Telehealth: Payer: Self-pay | Admitting: Obstetrics and Gynecology

## 2017-07-15 NOTE — Telephone Encounter (Signed)
Patient's daughter, Avon Gully (DPR on file to share PHI), called requesting to speak with Dr. Quincy Simmonds about her mom. She said she is also a family friend of Dr. Elza Rafter and she needs some advice on concerns she has about her mother that may be related to symptoms of dementia.

## 2017-07-15 NOTE — Telephone Encounter (Signed)
Phone call returned to Avon Gully, patient's daughter, per DPI. Patient states that her mother is developing cognitive changes and the family is looking for direction for her care. She is forgetting where she is driving, asking what medical issue her grandson has, and is not recognizing religious symbols which are well known to her.  I recommend neurology evaluation.  I have mentioned [facility] and [facility] as well as university evaluation at a cognitive care clinic.  She will pursue evaluation in [place] at this time.  I will be happy to place the referral if needed. She will let me know if the family needs anything further.  Cynthia's brother is a physician as well.

## 2017-07-15 NOTE — Telephone Encounter (Signed)
Spoke with patients daughter, "Caren Griffins", ok per current dpr. Advised daughter that Dr. Quincy Simmonds is out of the office today, will forward message for her review when she returns to the office on 10/17. Daughter thankful and verbalizes understanding.  Dr.Silva -please message below.

## 2017-07-16 ENCOUNTER — Telehealth: Payer: Self-pay | Admitting: Obstetrics and Gynecology

## 2017-07-16 DIAGNOSIS — R4189 Other symptoms and signs involving cognitive functions and awareness: Secondary | ICD-10-CM

## 2017-07-16 NOTE — Telephone Encounter (Signed)
Spoke with patients daughter, "Caren Griffins", ok per current dpr. Advised order placed for referral to [facility]. OK per Dr. Quincy Simmonds- see telephone encounter dated 07/15/17.  Advised daughter our referral department will f/u with scheduling. Daughter thankful and verbalizes understanding.  Routing to provider for final review. Patient is agreeable to disposition. Will close encounter.   Cc: Magdalene Patricia

## 2017-07-16 NOTE — Telephone Encounter (Signed)
Patients daughter Jessica Casey is calling to see if Dr. Quincy Simmonds can set up a referral for her mother to [facility]. She has already called [facility] and they are requesting a referral in order to see her. Jessica Casey states she spoke with Dr. Quincy Simmonds yesterday about this referral.

## 2017-07-16 NOTE — Telephone Encounter (Signed)
Dr. Quincy Simmonds -ok to close encounter?

## 2017-07-16 NOTE — Telephone Encounter (Signed)
I did close the encounter.

## 2017-07-17 ENCOUNTER — Ambulatory Visit (AMBULATORY_SURGERY_CENTER): Payer: Medicare Other | Admitting: Gastroenterology

## 2017-07-17 ENCOUNTER — Encounter: Payer: Self-pay | Admitting: Gastroenterology

## 2017-07-17 VITALS — BP 101/49 | HR 68 | Temp 98.6°F | Resp 13 | Ht 58.5 in | Wt 140.0 lb

## 2017-07-17 DIAGNOSIS — D125 Benign neoplasm of sigmoid colon: Secondary | ICD-10-CM

## 2017-07-17 DIAGNOSIS — Z86010 Personal history of colonic polyps: Secondary | ICD-10-CM

## 2017-07-17 DIAGNOSIS — D127 Benign neoplasm of rectosigmoid junction: Secondary | ICD-10-CM

## 2017-07-17 DIAGNOSIS — D122 Benign neoplasm of ascending colon: Secondary | ICD-10-CM

## 2017-07-17 MED ORDER — SODIUM CHLORIDE 0.9 % IV SOLN: 500.0000 mL | INTRAVENOUS | Status: DC

## 2017-07-17 NOTE — Progress Notes (Signed)
Called to room to assist during endoscopic procedure.  Patient ID and intended procedure confirmed with present staff. Received instructions for my participation in the procedure from the performing physician.  

## 2017-07-17 NOTE — Progress Notes (Signed)
Pt's states no medical or surgical changes since previsit or office visit. 

## 2017-07-17 NOTE — Patient Instructions (Signed)
YOU HAD AN ENDOSCOPIC PROCEDURE TODAY AT [facility]:   Refer to the procedure report that was given to you for any specific questions about what was found during the examination.  If the procedure report does not answer your questions, please call your gastroenterologist to clarify.  If you requested that your care partner not be given the details of your procedure findings, then the procedure report has been included in a sealed envelope for you to review at your convenience later.  YOU SHOULD EXPECT: Some feelings of bloating in the abdomen. Passage of more gas than usual.  Walking can help get rid of the air that was put into your GI tract during the procedure and reduce the bloating. If you had a lower endoscopy (such as a colonoscopy or flexible sigmoidoscopy) you may notice spotting of blood in your stool or on the toilet paper. If you underwent a bowel prep for your procedure, you may not have a normal bowel movement for a few days.  Please Note:  You might notice some irritation and congestion in your nose or some drainage.  This is from the oxygen used during your procedure.  There is no need for concern and it should clear up in a day or so.  SYMPTOMS TO REPORT IMMEDIATELY:   Following lower endoscopy (colonoscopy or flexible sigmoidoscopy):  Excessive amounts of blood in the stool  Significant tenderness or worsening of abdominal pains  Swelling of the abdomen that is new, acute  Fever of 100F or higher   For urgent or emergent issues, a gastroenterologist can be reached at any hour by calling [phone].   DIET:  We do recommend a small meal at first, but then you may proceed to your regular diet.  Drink plenty of fluids but you should avoid alcoholic beverages for 24 hours.  ACTIVITY:  You should plan to take it easy for the rest of today and you should NOT DRIVE or use heavy machinery until tomorrow (because of the sedation medicines used during the test).     FOLLOW UP: Our staff will call the number listed on your records the next business day following your procedure to check on you and address any questions or concerns that you may have regarding the information given to you following your procedure. If we do not reach you, we will leave a message.  However, if you are feeling well and you are not experiencing any problems, there is no need to return our call.  We will assume that you have returned to your regular daily activities without incident.  If any biopsies were taken you will be contacted by phone or by letter within the next 1-3 weeks.  Please call us at [phone] if you have not heard about the biopsies in 3 weeks.    SIGNATURES/CONFIDENTIALITY: You and/or your care partner have signed paperwork which will be entered into your electronic medical record.  These signatures attest to the fact that that the information above on your After Visit Summary has been reviewed and is understood.  Full responsibility of the confidentiality of this discharge information lies with you and/or your care-partner.   Polyp, diverticulosis, and hemorroid information given.  No ibuprofen, naproxen or other nonsteroidal anti inflammatory meds for 2 weeks after procedue.

## 2017-07-17 NOTE — Progress Notes (Signed)
To recovery, report to RN, VSS. 

## 2017-07-17 NOTE — Op Note (Signed)
[place] Patient Name: Jessica Casey Procedure Date: 07/17/2017 2:37 PM MRN: [id number] Endoscopist: Remo Lipps P. Armbruster MD, MD Age: 66 Referring MD:  Date of Birth: Jan 21, 1951 Gender: Female Account #: [id number] Procedure:                Colonoscopy Indications:              Surveillance: Personal history of adenomatous                            polyps on last colonoscopy 5 years ago Medicines:                Monitored Anesthesia Care Procedure:                Pre-Anesthesia Assessment:                           - Prior to the procedure, a History and Physical                            was performed, and patient medications and                            allergies were reviewed. The patient's tolerance of                            previous anesthesia was also reviewed. The risks                            and benefits of the procedure and the sedation                            options and risks were discussed with the patient.                            All questions were answered, and informed consent                            was obtained. Prior Anticoagulants: The patient has                            taken no previous anticoagulant or antiplatelet                            agents. ASA Grade Assessment: II - A patient with                            mild systemic disease. After reviewing the risks                            and benefits, the patient was deemed in                            satisfactory condition to undergo the procedure.  After obtaining informed consent, the colonoscope                            was passed under direct vision. Throughout the                            procedure, the patient's blood pressure, pulse, and                            oxygen saturations were monitored continuously. The                            Colonoscope was introduced through the anus and                            advanced to the the  cecum, identified by                            appendiceal orifice and ileocecal valve. The                            colonoscopy was performed without difficulty. The                            patient tolerated the procedure well. The quality                            of the bowel preparation was adequate. The                            ileocecal valve, appendiceal orifice, and rectum                            were photographed. Scope In: 2:50:24 PM Scope Out: 3:10:59 PM Scope Withdrawal Time: 0 hours 16 minutes 48 seconds  Total Procedure Duration: 0 hours 20 minutes 35 seconds  Findings:                 The perianal and digital rectal examinations were                            normal.                           A 4 mm polyp was found in the ascending colon. The                            polyp was sessile. The polyp was removed with a                            cold snare. Resection and retrieval were complete.                           A 5 mm polyp was found in the sigmoid colon. The  polyp was sessile. The polyp was removed with a                            cold snare. Resection and retrieval were complete.                           A 4 mm polyp was found in the recto-sigmoid colon.                            The polyp was sessile. The polyp was removed with a                            cold snare. Resection and retrieval were complete.                           Multiple medium-mouthed diverticula were found in                            the sigmoid colon.                           Internal hemorrhoids were found during retroflexion.                           The exam was otherwise without abnormality. Complications:            No immediate complications. Estimated blood loss:                            Minimal. Estimated Blood Loss:     Estimated blood loss was minimal. Impression:               - One 4 mm polyp in the ascending colon, removed                             with a cold snare. Resected and retrieved.                           - One 5 mm polyp in the sigmoid colon, removed with                            a cold snare. Resected and retrieved.                           - One 4 mm polyp at the recto-sigmoid colon,                            removed with a cold snare. Resected and retrieved.                           - Diverticulosis in the sigmoid colon.                           - Internal hemorrhoids.                           -  The examination was otherwise normal. Recommendation:           - Patient has a contact number available for                            emergencies. The signs and symptoms of potential                            delayed complications were discussed with the                            patient. Return to normal activities tomorrow.                            Written discharge instructions were provided to the                            patient.                           - Resume previous diet.                           - Continue present medications.                           - Await pathology results.                           - Repeat colonoscopy is recommended for                            surveillance. The colonoscopy date will be                            determined after pathology results from today's                            exam become available for review.                           - No ibuprofen, naproxen, or other non-steroidal                            anti-inflammatory drugs for 2 weeks after polyp                            removal. Remo Lipps P. Armbruster MD, MD 07/17/2017 3:14:36 PM This report has been signed electronically.

## 2017-07-18 ENCOUNTER — Telehealth: Payer: Self-pay

## 2017-07-18 NOTE — Telephone Encounter (Signed)
  Follow up Call-  Call back number 07/17/2017  Post procedure Call Back phone  # [phone]  Permission to leave phone message Yes  Some recent data might be hidden     Patient questions:  Do you have a fever, pain , or abdominal swelling? No. Pain Score  0 *  Have you tolerated food without any problems? Yes.    Have you been able to return to your normal activities? Yes.    Do you have any questions about your discharge instructions: Diet   No. Medications  No. Follow up visit  No.  Do you have questions or concerns about your Care? No.  Actions: * If pain score is 4 or above: No action needed, pain <4.

## 2017-07-23 ENCOUNTER — Encounter: Payer: Self-pay | Admitting: Gastroenterology

## 2017-08-27 ENCOUNTER — Telehealth: Payer: Self-pay | Admitting: Obstetrics and Gynecology

## 2017-08-27 NOTE — Telephone Encounter (Signed)
Please report BMD result to patient showing osteopenia of the forearm and hips.   Her forearm lost some bone mass.  The bilateral hips look improved.   Ok to continue the Fosamax and vitamin D.  She needs 3 - 4 servings of calcium rich foods daily or 1200 mg calcium daily in divided doses. I recommend weight bearing exercise.  Next BMD will be in 2 years.

## 2017-08-27 NOTE — Telephone Encounter (Signed)
Spoke with patient. Results given. Patient verbalizes understanding. Encounter closed. 

## 2017-09-12 ENCOUNTER — Encounter: Payer: Self-pay | Admitting: Obstetrics and Gynecology

## 2017-09-19 ENCOUNTER — Ambulatory Visit: Payer: Medicare Other | Admitting: Neurology

## 2017-09-19 ENCOUNTER — Encounter: Payer: Self-pay | Admitting: Neurology

## 2017-09-19 VITALS — BP 137/82 | HR 119 | Ht 58.5 in | Wt 141.0 lb

## 2017-09-19 DIAGNOSIS — R413 Other amnesia: Secondary | ICD-10-CM | POA: Insufficient documentation

## 2017-09-19 DIAGNOSIS — E538 Deficiency of other specified B group vitamins: Secondary | ICD-10-CM | POA: Diagnosis not present

## 2017-09-19 HISTORY — DX: Other amnesia: R41.3

## 2017-09-19 MED ORDER — DONEPEZIL HCL 5 MG PO TABS: 5.0000 mg | ORAL_TABLET | Freq: Every day | ORAL | 1 refills | Status: DC

## 2017-09-19 NOTE — Progress Notes (Signed)
Reason for visit: Memory disturbance  Referring physician: Dr. Gerarda Gunther Jessica Casey is a 66 y.o. female  History of present illness:  Ms. Jessica Casey is a 66 year old right-handed white female with a history of a memory disturbance that has been slowly progressive over the last year.  The patient herself has no realization that there is any issue with memory.  She does admit to some mild forgetfulness at times.  The patient comes in with 3 other family members who indicate that the patient has had some issues with directions with driving, the patient has difficulty recognizing roads that she should know.  The patient has not had any safety issues with driving, however.  The patient will misplace things about the house frequently, she will sometimes forget to take her medications.  She does also have some difficulty paying the bills at times, but in general she is able to manage this fairly well.  The patient has a history of sleep apnea, she is on CPAP and has been on was about 6 months.  She believes that this has helped her general level of energy, she feels more rested during the day.  The patient also has had a long-standing history of depression that has never been completely managed.  The patient denies any numbness or weakness of the extremities, she denies issues controlling the bowels of the bladder and she denies any balance issues.  The patient indicates that her father and an older sister had memory problems.  She is sent to this office for further evaluation.  Past Medical History:  Diagnosis Date  . Anxiety   . Cataract   . Colonic polyp 2005 & 2013  . DDD (degenerative disc disease)   . Depression   . GERD (gastroesophageal reflux disease)   . H/O echocardiogram    before 2000, no need for f/u /w cardiac   . Heart murmur    MVP- echo- 2010, no symptoms   . Mononucleosis 1971  . Osteoporosis   . Pinched nerve in neck   . Sleep apnea     Past Surgical History:  Procedure  Laterality Date  . ANTERIOR CERVICAL DECOMP/DISCECTOMY FUSION N/A 05/20/2013   Procedure: ANTERIOR CERVICAL DECOMPRESSION/DISCECTOMY FUSION 2 LEVELS;  Surgeon: Erline Levine, MD;  Location: [facility];  Service: Neurosurgery;  Laterality: N/A;  Cervical Seven-Thoracic One, Thoracic One-Two Anterior cervical decompression/Diskectomy/Fusion  . CARPAL TUNNEL RELEASE Right   . CATARACT EXTRACTION, BILATERAL  2013   Dr Gershon Crane, Viona Gilmore IOL  . CERVICAL FUSION     2 proceduresr Vertell Limber  . colonoscopy with polypectomy  2013   Dr Deatra Ina  . FRACTURE SURGERY     L wrist - June 2014  . HERNIA REPAIR    . KNEE SURGERY     Dr Wonda Olds ; bursa cystectomy  . LUMBAR DISC SURGERY     Dr.Stern  . ROTATOR CUFF REPAIR     Dr.Wainer  . UMBILICAL HERNIA REPAIR    . WRIST FRACTURE SURGERY Left 02/2013   Dr. Kathryne Hitch    Family History  Problem Relation Age of Onset  . Diabetes Father   . Coronary artery disease Father        S/P CBAG , no MI -- Dec  . Depression Father   . Diverticulosis Father   . Breast cancer Mother        estorgen receptor positive  . Stroke Mother 2       Dec  . Breast cancer  Sister        estrogen receptor positive  . Depression Sister   . Depression Sister   . Depression Sister   . Depression Sister   . Depression Brother   . Bipolar disorder Other        Neice  . Colon polyps Sister   . Colon cancer Neg Hx   . Esophageal cancer Neg Hx   . Pancreatic cancer Neg Hx   . Rectal cancer Neg Hx   . Stomach cancer Neg Hx     Social history:  reports that  has never smoked. she has never used smokeless tobacco. She reports that she does not drink alcohol or use drugs.  Medications:  Prior to Admission medications   Medication Sig Start Date End Date Taking? Authorizing Provider  alendronate (FOSAMAX) 70 MG tablet TAKE 1 TABLET EVERY SUNDAY. TAKE WITH A FULL GLASS OF  WATER ON AN EMPTY STOMACH 07/11/17  Yes Nunzio Cobbs, MD  aspirin 81 MG tablet Take 81 mg by  mouth daily.     Yes [provider]  Biotin 300 MCG TABS Take 300 mcg by mouth daily.     Yes [provider]  buPROPion (WELLBUTRIN XL) 300 MG 24 hr tablet Take 1 tablet (300 mg total) by mouth daily. 07/11/17  Yes Nunzio Cobbs, MD  Cholecalciferol (VITAMIN D) 2000 UNITS CAPS Take by mouth.   Yes [provider]  conjugated estrogens (PREMARIN) vaginal cream Use pea size amount to urethra twice a week at bedtime. 06/21/15  Yes Nunzio Cobbs, MD  diclofenac sodium (VOLTAREN) 1 % GEL Apply 2 g topically 4 (four) times daily. 08/06/16  Yes [name], [name], MD  escitalopram (LEXAPRO) 20 MG tablet Take 1 tablet (20 mg total) by mouth daily. 07/11/17  Yes Nunzio Cobbs, MD  Omega-3 Fatty Acids (FISH OIL PO) Take by mouth.   Yes [provider]      Allergies  Allergen Reactions  . Gabapentin     REACTION: TOUNGE SWELLING, ITCHING    ROS:  Out of a complete 14 system review of symptoms, the patient complains only of the following symptoms, and all other reviewed systems are negative.  Weight gain Blurred vision Snoring, on CPAP Depression, too much sleep, decreased energy, disinterest in activities  Blood pressure 137/82, pulse (!) 119, height 4' 10.5" (1.486 m), weight 141 lb (64 kg), last menstrual period 10/01/1995.  Physical Exam  General: The patient is alert and cooperative at the time of the examination.  Eyes: Pupils are equal, round, and reactive to light. Discs are flat bilaterally.  Neck: The neck is supple, no carotid bruits are noted.  Respiratory: The respiratory examination is clear.  Cardiovascular: The cardiovascular examination reveals a regular rate and rhythm, no obvious murmurs or rubs are noted.  Skin: Extremities are without significant edema.  Neurologic Exam  Mental status: The patient is alert and oriented x 3 at the time of the examination. The patient has apparent normal recent  and remote memory, with an apparently normal attention span and concentration ability.  Mini-Mental status examination done today shows a total score of 30/30.  Cranial nerves: Facial symmetry is present. There is good sensation of the face to pinprick and soft touch bilaterally. The strength of the facial muscles and the muscles to head turning and shoulder shrug are normal bilaterally. Speech is well enunciated, no aphasia or dysarthria is noted. Extraocular  movements are full. Visual fields are full. The tongue is midline, and the patient has symmetric elevation of the soft palate. No obvious hearing deficits are noted.  Motor: The motor testing reveals 5 over 5 strength of all 4 extremities. Good symmetric motor tone is noted throughout.  Sensory: Sensory testing is intact to pinprick, soft touch, vibration sensation, and position sense on all 4 extremities. No evidence of extinction is noted.  Coordination: Cerebellar testing reveals good finger-nose-finger and heel-to-shin bilaterally.  Gait and station: Gait is normal. Tandem gait is very slightly unsteady. Romberg is negative. No drift is seen.  Reflexes: Deep tendon reflexes are symmetric and normal bilaterally. Toes are downgoing bilaterally.   Assessment/Plan:  1.  Mild memory disturbance, mild cognitive impairment  The patient does have a history of sleep apnea on CPAP, and a history of depression.  The family has noted a change in her cognitive status.  The patient herself has no realization of this.  The patient will be set up for blood work today, she will have MRI of the brain.  A prescription was sent in for Aricept to begin at the 5 mg nightly dose.  She will follow-up in 6 months, she will call for dose adjustments of the Aricept.  Jill Alexanders MD 09/19/2017 11:35 AM  [facility] [address] [place], [place] [address]  Phone [phone] Fax [phone]

## 2017-09-19 NOTE — Patient Instructions (Addendum)
   We will get MRI of the brain and get blood work today.  We will start Aricept for the memory.  Begin Aricept (donepezil) at 5 mg at night for one month. If this medication is well-tolerated, please call our office and we will call in a prescription for the 10 mg tablets. Look out for side effects that may include nausea, diarrhea, weight loss, or stomach cramps. This medication will also cause a runny nose, therefore there is no need for allergy medications for this purpose.

## 2017-09-20 LAB — RPR: RPR Ser Ql: NONREACTIVE

## 2017-09-20 LAB — SEDIMENTATION RATE: SED RATE: 13 mm/h (ref 0–40)

## 2017-09-20 LAB — VITAMIN B12: Vitamin B-12: 735 pg/mL (ref 232–1245)

## 2017-09-26 ENCOUNTER — Telehealth: Payer: Self-pay | Admitting: Neurology

## 2017-09-26 NOTE — Telephone Encounter (Signed)
Raquel Sarna called [employer] No Auth required No Exp. Date. Case # [id number]. Raquel Sarna can you just set her apt for Her MRI with where you want her to go that's all that needs to be done. Thanks Hinton Dyer.

## 2017-09-29 ENCOUNTER — Other Ambulatory Visit: Payer: Self-pay

## 2017-09-29 ENCOUNTER — Ambulatory Visit (INDEPENDENT_AMBULATORY_CARE_PROVIDER_SITE_OTHER): Payer: Medicare Other | Admitting: Obstetrics and Gynecology

## 2017-09-29 ENCOUNTER — Telehealth: Payer: Self-pay | Admitting: Obstetrics and Gynecology

## 2017-09-29 ENCOUNTER — Encounter: Payer: Self-pay | Admitting: Obstetrics and Gynecology

## 2017-09-29 VITALS — BP 142/76 | HR 88 | Resp 16 | Wt 138.0 lb

## 2017-09-29 DIAGNOSIS — R300 Dysuria: Secondary | ICD-10-CM | POA: Diagnosis not present

## 2017-09-29 DIAGNOSIS — R3915 Urgency of urination: Secondary | ICD-10-CM

## 2017-09-29 DIAGNOSIS — R350 Frequency of micturition: Secondary | ICD-10-CM | POA: Diagnosis not present

## 2017-09-29 LAB — POCT URINALYSIS DIPSTICK
Bilirubin, UA: NEGATIVE
Glucose, UA: NEGATIVE
Ketones, UA: NEGATIVE
NITRITE UA: NEGATIVE
PROTEIN UA: NEGATIVE
Urobilinogen, UA: NEGATIVE E.U./dL — AB
pH, UA: 5 (ref 5.0–8.0)

## 2017-09-29 MED ORDER — SULFAMETHOXAZOLE-TRIMETHOPRIM 800-160 MG PO TABS: 1.0000 | ORAL_TABLET | Freq: Two times a day (BID) | ORAL | 0 refills | Status: DC

## 2017-09-29 MED ORDER — PHENAZOPYRIDINE HCL 200 MG PO TABS: 200.0000 mg | ORAL_TABLET | Freq: Three times a day (TID) | ORAL | 0 refills | Status: DC | PRN

## 2017-09-29 NOTE — Progress Notes (Signed)
GYNECOLOGY  VISIT   HPI: 66 y.o.   Married  Caucasian  female   G2P2002 with Patient's last menstrual period was 10/01/1995 (approximate).   here c/o urinary frequency and urgency for 1 week. She c/o dysuria at the end of voiding. Voiding small amounts to large amounts. She has drinking lots of fluid. Some mild urge incontinence in the last week. No fevers, no mid back pain.  She had some recurrent UTI's about a year and a half ago. None since.   GYNECOLOGIC HISTORY: Patient's last menstrual period was 10/01/1995 (approximate). Contraception:postmenopause  Menopausal hormone therapy: Premarin         OB History    Gravida Para Term Preterm AB Living   2 2 2     2    SAB TAB Ectopic Multiple Live Births                     Patient Active Problem List   Diagnosis Date Noted  . Memory disorder 09/19/2017  . OSA (obstructive sleep apnea) 02/04/2017  . Osteoarthritis, multiple sites 08/06/2016  . Cough 02/28/2016  . Pain of left lower leg 08/08/2015  . Anemia 02/17/2013  . Hypoalbuminemia 02/17/2013  . Unspecified vitamin D deficiency 02/17/2013  . Depression, major, in remission ([place]) 08/02/2009  . Osteopenia 08/02/2009  . Fatigue 08/02/2009  . COLONIC POLYPS, HX OF 08/02/2009  . DEGENERATIVE DISC DISEASE 10/27/2008  . Brachial neuritis or radiculitis 10/27/2008    Past Medical History:  Diagnosis Date  . Anxiety   . Cataract   . Colonic polyp 2005 & 2013  . DDD (degenerative disc disease)   . Depression   . GERD (gastroesophageal reflux disease)   . H/O echocardiogram    before 2000, no need for f/u /w cardiac   . Heart murmur    MVP- echo- 2010, no symptoms   . Memory disorder 09/19/2017  . Mononucleosis 1971  . Osteoporosis   . Pinched nerve in neck   . Sleep apnea     Past Surgical History:  Procedure Laterality Date  . ANTERIOR CERVICAL DECOMP/DISCECTOMY FUSION N/A 05/20/2013   Procedure: ANTERIOR CERVICAL DECOMPRESSION/DISCECTOMY FUSION 2 LEVELS;  Surgeon:  Erline Levine, MD;  Location: [facility];  Service: Neurosurgery;  Laterality: N/A;  Cervical Seven-Thoracic One, Thoracic One-Two Anterior cervical decompression/Diskectomy/Fusion  . CARPAL TUNNEL RELEASE Right   . CATARACT EXTRACTION, BILATERAL  2013   Dr Gershon Crane, Viona Gilmore IOL  . CERVICAL FUSION     2 proceduresr Vertell Limber  . colonoscopy with polypectomy  2013   Dr Deatra Ina  . FRACTURE SURGERY     L wrist - June 2014  . HERNIA REPAIR    . KNEE SURGERY     Dr Wonda Olds ; bursa cystectomy  . LUMBAR DISC SURGERY     Dr.Stern  . ROTATOR CUFF REPAIR     Dr.Wainer  . UMBILICAL HERNIA REPAIR    . WRIST FRACTURE SURGERY Left 02/2013   Dr. Kathryne Hitch    Current Outpatient Medications  Medication Sig Dispense Refill  . alendronate (FOSAMAX) 70 MG tablet TAKE 1 TABLET EVERY SUNDAY. TAKE WITH A FULL GLASS OF  WATER ON AN EMPTY STOMACH 12 tablet 3  . aspirin 81 MG tablet Take 81 mg by mouth daily.      . Biotin 300 MCG TABS Take 300 mcg by mouth daily.      Marland Kitchen buPROPion (WELLBUTRIN XL) 300 MG 24 hr tablet Take 1 tablet (300 mg total) by mouth  daily. 90 tablet 3  . Cholecalciferol (VITAMIN D) 2000 UNITS CAPS Take by mouth.    . conjugated estrogens (PREMARIN) vaginal cream Use pea size amount to urethra twice a week at bedtime. 30 g 1  . diclofenac sodium (VOLTAREN) 1 % GEL Apply 2 g topically 4 (four) times daily. 3 Tube 3  . donepezil (ARICEPT) 5 MG tablet Take 1 tablet (5 mg total) by mouth at bedtime. 30 tablet 1  . escitalopram (LEXAPRO) 20 MG tablet Take 1 tablet (20 mg total) by mouth daily. 90 tablet 3  . Omega-3 Fatty Acids (FISH OIL PO) Take by mouth.     No current facility-administered medications for this visit.      ALLERGIES: Gabapentin  Family History  Problem Relation Age of Onset  . Diabetes Father   . Coronary artery disease Father        S/P CBAG , no MI -- Dec  . Depression Father   . Diverticulosis Father   . Breast cancer Mother        estorgen receptor positive  .  Stroke Mother 104       Dec  . Breast cancer Sister        estrogen receptor positive  . Depression Sister   . Depression Sister   . Depression Sister   . Depression Sister   . Depression Brother   . Bipolar disorder Other        Neice  . Colon polyps Sister   . Colon cancer Neg Hx   . Esophageal cancer Neg Hx   . Pancreatic cancer Neg Hx   . Rectal cancer Neg Hx   . Stomach cancer Neg Hx     Social History   Socioeconomic History  . Marital status: Married    Spouse name: Not on file  . Number of children: Not on file  . Years of education: 16+  . Highest education level: Not on file  Social Needs  . Financial resource strain: Not on file  . Food insecurity - worry: Not on file  . Food insecurity - inability: Not on file  . Transportation needs - medical: Not on file  . Transportation needs - non-medical: Not on file  Occupational History  . Occupation: Pharmacist, hospital- Retired  Tobacco Use  . Smoking status: Never Smoker  . Smokeless tobacco: Never Used  Substance and Sexual Activity  . Alcohol use: No    Alcohol/week: 0.0 oz  . Drug use: No  . Sexual activity: Yes    Partners: Male    Birth control/protection: Post-menopausal  Other Topics Concern  . Not on file  Social History Narrative   Lives   Caffeine use:    Right handed     Review of Systems  Genitourinary: Positive for dysuria, frequency and urgency.    PHYSICAL EXAMINATION:    BP (!) 142/76 (BP Location: Right Arm, Patient Position: Sitting, Cuff Size: Normal)   Pulse 88   Resp 16   Wt 138 lb (62.6 kg)   LMP 10/01/1995 (Approximate)   BMI 28.35 kg/m     General appearance: alert, cooperative and appears stated age Abdomen: soft, non-tender; non distended, no masses,  no organomegaly CVA: not tender   Urine dip: 3+ blood, moderate leuk.  ASSESSMENT Cytitis    PLAN Treat with bactrim ds and pyridium Call if not better in 48 hours Send urine for ua, c&s   An After Visit Summary was  printed and given to the patient.

## 2017-09-29 NOTE — Patient Instructions (Signed)

## 2017-09-29 NOTE — Telephone Encounter (Signed)
Spoke with patient. Patient is having urinary urgency and decreased output. Denies any burning with urination, lower back pain, fever, or chills. Advised will need to be seen for further evaluation. Patient is agreeable. Appointment scheduled for today at 3:30 pm with Dr.Jertson.  Routing to provider for final review. Patient agreeable to disposition. Will close encounter.

## 2017-09-29 NOTE — Telephone Encounter (Signed)
Patient thinks she may have a UTI.  She is having some urgency with little urine output.  No burning no fever, nausea, or vomiting.  Patient aware we do not treat over the phone.

## 2017-09-30 LAB — URINALYSIS, MICROSCOPIC ONLY
Casts: NONE SEEN /lpf
WBC, UA: >30 /hpf — AB (ref 0–?)

## 2017-10-01 NOTE — Telephone Encounter (Signed)
Noted, thank you sent the order to [place].

## 2017-10-02 LAB — URINE CULTURE

## 2017-10-07 ENCOUNTER — Encounter: Payer: Self-pay | Admitting: Family Medicine

## 2017-10-09 ENCOUNTER — Ambulatory Visit
Admission: RE | Admit: 2017-10-09 | Discharge: 2017-10-09 | Disposition: A | Discharge status: Home / Self Care | Payer: Medicare Other | Source: Ambulatory Visit | Attending: Neurology | Admitting: Neurology

## 2017-10-09 DIAGNOSIS — R413 Other amnesia: Secondary | ICD-10-CM

## 2017-10-10 ENCOUNTER — Encounter: Payer: Self-pay | Admitting: Neurology

## 2017-10-10 ENCOUNTER — Telehealth: Payer: Self-pay | Admitting: Neurology

## 2017-10-10 NOTE — Telephone Encounter (Signed)
  I called the patient.  MRI of the brain shows a moderate level of small vessel ischemic changes.  No acute changes are seen.  The patient is on low-dose aspirin.  She is getting on Aricept, she will call if she tolerates the 5 mg dose for 1 month and we will increase to a 10 mg dosing.  MRI brain 10/10/17:   IMPRESSION:  This MRI of the brain without contrast shows the following: 1.     Advanced for age chronic microvascular ischemic changes in both hemispheres, right greater than left. 2.     Brain volume is normal for age 67.     There are no acute findings. 4.     Left mastoid effusion likely representing left eustachian tube dysfunction

## 2017-10-13 ENCOUNTER — Encounter: Payer: Self-pay | Admitting: Psychology

## 2017-10-13 ENCOUNTER — Other Ambulatory Visit: Payer: Self-pay | Admitting: Neurology

## 2017-10-13 DIAGNOSIS — R413 Other amnesia: Secondary | ICD-10-CM

## 2017-10-21 ENCOUNTER — Telehealth: Payer: Self-pay | Admitting: Neurology

## 2017-10-21 MED ORDER — DONEPEZIL HCL 10 MG PO TABS: 10.0000 mg | ORAL_TABLET | Freq: Every day | ORAL | 3 refills | Status: DC

## 2017-10-21 NOTE — Telephone Encounter (Signed)
Pt request new RX for donepezil 10mg  sent to [facility] 90 day supply.

## 2017-10-21 NOTE — Telephone Encounter (Signed)
A prescription for Aricept was written.

## 2017-11-15 ENCOUNTER — Other Ambulatory Visit: Payer: Self-pay | Admitting: Neurology

## 2017-11-25 ENCOUNTER — Encounter: Payer: Self-pay | Admitting: Family Medicine

## 2017-11-25 ENCOUNTER — Ambulatory Visit: Payer: Medicare Other | Admitting: Family Medicine

## 2017-11-25 ENCOUNTER — Encounter: Payer: Self-pay | Admitting: Neurology

## 2017-11-25 ENCOUNTER — Ambulatory Visit (INDEPENDENT_AMBULATORY_CARE_PROVIDER_SITE_OTHER)
Admission: RE | Admit: 2017-11-25 | Discharge: 2017-11-25 | Disposition: A | Discharge status: Home / Self Care | Payer: Medicare Other | Source: Ambulatory Visit | Attending: Family Medicine | Admitting: Family Medicine

## 2017-11-25 VITALS — BP 120/66 | HR 94 | Temp 100.2°F | Resp 12 | Ht 58.5 in | Wt 134.4 lb

## 2017-11-25 DIAGNOSIS — R509 Fever, unspecified: Secondary | ICD-10-CM

## 2017-11-25 DIAGNOSIS — R05 Cough: Secondary | ICD-10-CM

## 2017-11-25 DIAGNOSIS — R0989 Other specified symptoms and signs involving the circulatory and respiratory systems: Secondary | ICD-10-CM

## 2017-11-25 DIAGNOSIS — R059 Cough, unspecified: Secondary | ICD-10-CM

## 2017-11-25 DIAGNOSIS — J989 Respiratory disorder, unspecified: Secondary | ICD-10-CM

## 2017-11-25 DIAGNOSIS — J988 Other specified respiratory disorders: Secondary | ICD-10-CM

## 2017-11-25 LAB — POC INFLUENZA A&B (BINAX/QUICKVUE)
Influenza A, POC: NEGATIVE
Influenza B, POC: NEGATIVE

## 2017-11-25 MED ORDER — AZITHROMYCIN 250 MG PO TABS: ORAL_TABLET | ORAL | 0 refills | Status: DC

## 2017-11-25 MED ORDER — HYDROCODONE-HOMATROPINE 5-1.5 MG/5ML PO SYRP
5.0000 mL | ORAL_SOLUTION | Freq: Three times a day (TID) | ORAL | 0 refills | Status: AC | PRN
Start: 2017-11-25 — End: 2017-12-05

## 2017-11-25 MED ORDER — ALBUTEROL SULFATE HFA 108 (90 BASE) MCG/ACT IN AERS: 2.0000 | INHALATION_SPRAY | Freq: Four times a day (QID) | RESPIRATORY_TRACT | 0 refills | Status: DC | PRN

## 2017-11-25 NOTE — Patient Instructions (Addendum)
A few things to remember from today's visit:   Fever, unspecified fever cause - Plan: POC Influenza A&B (Binax test), DG Chest 2 View  Cough - Plan: POC Influenza A&B (Binax test), HYDROcodone-homatropine (HYCODAN) 5-1.5 MG/5ML syrup, DG Chest 2 View  Respiratory tract infection - Plan: azithromycin (ZITHROMAX) 250 MG tablet  Reactive airway disease that is not asthma - Plan: albuterol (PROVENTIL HFA;VENTOLIN HFA) 108 (90 Base) MCG/ACT inhaler  Albuterol inh 2 puff every 6 hours for a week then as needed for wheezing or shortness of breath.  Today X ray was ordered.  This can be done at [facility] between 8 am and 5 pm: [place]. [phone].   Please be sure medication list is accurate. If a new problem present, please set up appointment sooner than planned today.

## 2017-11-25 NOTE — Progress Notes (Signed)
ACUTE VISIT  HPI:  Chief Complaint  Patient presents with  . Cough    started 3 weeks ago  . Fever  . Headache    [name] is a 67 y.o.female here today complaining of 3 weeks of nonproductive cough, intermittent wheezing, and body aches.   She has no noted fever at home, temp 98 F. Here today temp 100.2 F. "A little" wheezing, she denies dyspnea. No history of asthma or tobacco use.  Body aches are aggravated by coughing spells.   Cough  This is a new problem. The current episode started 1 to 4 weeks ago. The problem has been gradually worsening. The problem occurs constantly. The cough is non-productive. Associated symptoms include chills, a fever, headaches, myalgias and wheezing. Pertinent negatives include no ear congestion, ear pain, eye redness, heartburn, hemoptysis, nasal congestion, postnasal drip, rash, rhinorrhea, sore throat or shortness of breath. There is no history of environmental allergies.  Fever   Associated symptoms include coughing, headaches and wheezing. Pertinent negatives include no abdominal pain, congestion, diarrhea, ear pain, nausea, rash, sore throat, urinary pain or vomiting.  Headache   Associated symptoms include coughing and a fever. Pertinent negatives include no abdominal pain, ear pain, eye redness, nausea, neck pain, rhinorrhea, sinus pressure, sore throat, vomiting or weakness.   She has no noted heartburn or GERD symptoms.  Bitemporal and parietal "little" headache, achy-like pain.  No associated visual changes, nausea, vomiting.  No Hx of recent travel. No sick contact. No known insect bite.  Hx of allergies: No  OTC medications for this problem: None   Review of Systems  Constitutional: Positive for chills, fatigue and fever. Negative for activity change and appetite change.  HENT: Negative for congestion, ear pain, mouth sores, postnasal drip, rhinorrhea, sinus pressure, sore throat, trouble swallowing and  voice change.   Eyes: Negative for discharge, redness and visual disturbance.  Respiratory: Positive for cough and wheezing. Negative for hemoptysis and shortness of breath.   Cardiovascular: Negative for leg swelling.  Gastrointestinal: Negative for abdominal pain, diarrhea, heartburn, nausea and vomiting.  Genitourinary: Negative for decreased urine volume, dysuria and hematuria.  Musculoskeletal: Positive for myalgias. Negative for gait problem and neck pain.  Skin: Negative for rash.  Allergic/Immunologic: Negative for environmental allergies.  Neurological: Positive for headaches. Negative for syncope and weakness.  Hematological: Negative for adenopathy. Does not bruise/bleed easily.  Psychiatric/Behavioral: Positive for sleep disturbance. Negative for confusion. The patient is nervous/anxious.       Current Outpatient Medications on File Prior to Visit  Medication Sig Dispense Refill  . alendronate (FOSAMAX) 70 MG tablet TAKE 1 TABLET EVERY SUNDAY. TAKE WITH A FULL GLASS OF  WATER ON AN EMPTY STOMACH 12 tablet 3  . aspirin 81 MG tablet Take 81 mg by mouth daily.      . Biotin 300 MCG TABS Take 300 mcg by mouth daily.      Marland Kitchen buPROPion (WELLBUTRIN XL) 300 MG 24 hr tablet Take 1 tablet (300 mg total) by mouth daily. 90 tablet 3  . Cholecalciferol (VITAMIN D) 2000 UNITS CAPS Take by mouth.    . conjugated estrogens (PREMARIN) vaginal cream Use pea size amount to urethra twice a week at bedtime. 30 g 1  . diclofenac sodium (VOLTAREN) 1 % GEL Apply 2 g topically 4 (four) times daily. 3 Tube 3  . donepezil (ARICEPT) 10 MG tablet Take 1 tablet (10 mg total) by mouth at bedtime. 90 tablet 3  .  escitalopram (LEXAPRO) 20 MG tablet Take 1 tablet (20 mg total) by mouth daily. 90 tablet 3  . Omega-3 Fatty Acids (FISH OIL PO) Take by mouth.     No current facility-administered medications on file prior to visit.      Past Medical History:  Diagnosis Date  . Anxiety   . Cataract   .  Colonic polyp 2005 & 2013  . DDD (degenerative disc disease)   . Depression   . GERD (gastroesophageal reflux disease)   . H/O echocardiogram    before 2000, no need for f/u /w cardiac   . Heart murmur    MVP- echo- 2010, no symptoms   . Memory disorder 09/19/2017  . Mononucleosis 1971  . Osteoporosis   . Pinched nerve in neck   . Sleep apnea    Allergies  Allergen Reactions  . Gabapentin     REACTION: TOUNGE SWELLING, ITCHING    Social History   Socioeconomic History  . Marital status: Married    Spouse name: None  . Number of children: None  . Years of education: 16+  . Highest education level: None  Social Needs  . Financial resource strain: None  . Food insecurity - worry: None  . Food insecurity - inability: None  . Transportation needs - medical: None  . Transportation needs - non-medical: None  Occupational History  . Occupation: Pharmacist, hospital- Retired  Tobacco Use  . Smoking status: Never Smoker  . Smokeless tobacco: Never Used  Substance and Sexual Activity  . Alcohol use: No    Alcohol/week: 0.0 oz  . Drug use: No  . Sexual activity: Yes    Partners: Male    Birth control/protection: Post-menopausal  Other Topics Concern  . None  Social History Narrative   Lives   Caffeine use:    Right handed     Vitals:   11/25/17 1203  BP: 120/66  Pulse: 94  Resp: 12  Temp: 100.2 F (37.9 C)  SpO2: 95%   Body mass index is 27.61 kg/m.    Physical Exam  Nursing note and vitals reviewed. Constitutional: She is oriented to person, place, and time. She appears well-developed. She does not appear ill. No distress.  HENT:  Head: Normocephalic and atraumatic.  Right Ear: Tympanic membrane, external ear and ear canal normal.  Left Ear: Tympanic membrane, external ear and ear canal normal.  Nose: Rhinorrhea present. Right sinus exhibits no maxillary sinus tenderness and no frontal sinus tenderness. Left sinus exhibits no maxillary sinus tenderness and no  frontal sinus tenderness.  Mouth/Throat: Oropharynx is clear and moist and mucous membranes are normal.  Eyes: Conjunctivae are normal.  Neck: No muscular tenderness present.  Cardiovascular: Normal rate and regular rhythm.  No murmur heard. Respiratory: Effort normal. No stridor. No respiratory distress. She has wheezes. She has no rales.  Persistent nonproductive cough during visit. Left base course sounds after breathing treatment.  Lymphadenopathy:       Head (right side): No submandibular adenopathy present.       Head (left side): No submandibular adenopathy present.    She has no cervical adenopathy.  Neurological: She is alert and oriented to person, place, and time. She has normal strength. Gait normal.  Skin: Skin is warm. No rash noted. No erythema.  Psychiatric: She has a normal mood and affect. Her speech is normal.  Well groomed, good eye contact.     ASSESSMENT AND PLAN:   Ms. Jessica Casey was seen today for cough, fever  and headache.  Diagnoses and all orders for this visit:  Fever, unspecified fever cause  It seems that fever started today. We discussed possible etiologies, ? new viral illness. Rapid flu test here in the office negative. OTC Tylenol for symptomatic treatment.  -     POC Influenza A&B (Binax test) -     DG Chest 2 View; Future  Cough We discussed possible etiologies, including residual symptoms from prior URI infection, GERD, RAD,allergies among some. Symptomatic treatment with Hycodan were recommended, side effect discussed. Further recommendations will be given according to imaging results.  -     POC Influenza A&B (Binax test) -     HYDROcodone-homatropine (HYCODAN) 5-1.5 MG/5ML syrup; Take 5 mLs by mouth every 8 (eight) hours as needed for up to 10 days for cough. -     DG Chest 2 View; Future  Respiratory tract infection  After DuoNeb neb treatment mild coarse respiratory sounds noted in the left base.   No rales and wheezing  resolved. Because 3 weeks of cough + new onset of fever, I recommend antibiotic treatment. Further recommendation will be given according to imaging results. Instructed about warning signs. Follow-up in 2 weeks if still symptomatic, before if needed.  -     azithromycin (ZITHROMAX) 250 MG tablet; 2 tabs day one then 1 tab daily for 4 days.  Reactive airway disease that is not asthma  Wheezing improved after DuoNeb neb treatment as well as coughing spells. She will continue with Albuterol inh 2 puff every 6 hours for a week then as needed for wheezing or shortness of breath.  I do not think oral Prednisone is needed at this time. Instructed about warning signs.  -     albuterol (PROVENTIL HFA;VENTOLIN HFA) 108 (90 Base) MCG/ACT inhaler; Inhale 2 puffs into the lungs every 6 (six) hours as needed for wheezing or shortness of breath.      -Ms. Jessica Casey was advised to seek attention immediately if symptoms worsen or to follow if they persist or new concerns arise.       [name]. [name], MD  [facility]. [place] office.

## 2017-11-26 ENCOUNTER — Encounter: Payer: Self-pay | Admitting: Family Medicine

## 2017-12-05 ENCOUNTER — Encounter: Payer: Self-pay | Admitting: Family Medicine

## 2017-12-15 ENCOUNTER — Encounter: Payer: Self-pay | Admitting: Family Medicine

## 2018-01-27 ENCOUNTER — Ambulatory Visit: Payer: Self-pay

## 2018-02-03 NOTE — Progress Notes (Signed)
Subjective:   Jessica Casey is a 67 y.o. female who presents for an Initial Medicare Annual Wellness Visit.  Reports health as good  Last seen 11/25/2017 Dr. Judeth Horn 06/2018 GYN Was Dr. Linna Darner patient and moved to [facility] x 67 yo  Lives in [place] and dtr lives there with special needs child He is   78.5 yo he has a sister 60 yo younger  Has 3 other grands  Son has 49 yo; and new baby; Lena  Diet BMI 27.3  Chol/hdl 3   Exercise States she does not exercise as she did Walks generally  Did walk 7 miles and wants to start back  Health Maintenance Due  Topic Date Due  . Hepatitis C Screening  Jan 03, 1951  . PNA vac Low Risk Adult (1 of 2 - PCV13) 03/24/2015   Will have hep c at her next blood draw   Needs tdap and will get this at the pharamcy  Mammogram 04/29/2017 - annually  Breast cancer in the family  Older sister and mother  Mother died of reoccurence  Dexa August 31, 2017 -1.9 GYN: dr. Josefa Half and she follows her dexa and mammogram   Colonoscopy 06/2017; due 06/2022    Cardiac Risk Factors include: advanced age (>13men, >72 women);family history of premature cardiovascular disease     Objective:    Today's Vitals   02/04/18 1403  BP: 112/70  Pulse: 71  SpO2: 98%  Weight: 135 lb (61.2 kg)  Height: 4\' 11"  (1.499 m)   Body mass index is 27.27 kg/m.  Advanced Directives 02/04/2018 07/08/2017 05/13/2013  Does Patient Have a Medical Advance Directive? Yes Yes Patient has advance directive, copy not in chart  Type of Advance Directive - [place];Living will Living will    Current Medications (verified) Outpatient Encounter Medications as of 02/04/2018  Medication Sig  . albuterol (PROVENTIL HFA;VENTOLIN HFA) 108 (90 Base) MCG/ACT inhaler Inhale 2 puffs into the lungs every 6 (six) hours as needed for wheezing or shortness of breath.  Marland Kitchen alendronate (FOSAMAX) 70 MG tablet TAKE 1 TABLET EVERY SUNDAY. TAKE WITH A FULL GLASS OF  WATER ON  AN EMPTY STOMACH  . aspirin 81 MG tablet Take 81 mg by mouth daily.    . Biotin 300 MCG TABS Take 300 mcg by mouth daily.    Marland Kitchen buPROPion (WELLBUTRIN XL) 300 MG 24 hr tablet Take 1 tablet (300 mg total) by mouth daily.  . Cholecalciferol (VITAMIN D) 2000 UNITS CAPS Take by mouth.  . conjugated estrogens (PREMARIN) vaginal cream Use pea size amount to urethra twice a week at bedtime.  . diclofenac sodium (VOLTAREN) 1 % GEL Apply 2 g topically 4 (four) times daily.  Marland Kitchen donepezil (ARICEPT) 10 MG tablet Take 1 tablet (10 mg total) by mouth at bedtime.  Marland Kitchen escitalopram (LEXAPRO) 20 MG tablet Take 1 tablet (20 mg total) by mouth daily.  . Omega-3 Fatty Acids (FISH OIL PO) Take by mouth.  . [DISCONTINUED] azithromycin (ZITHROMAX) 250 MG tablet 2 tabs day one then 1 tab daily for 4 days.   No facility-administered encounter medications on file as of 02/04/2018.     Allergies (verified) Gabapentin   History: Past Medical History:  Diagnosis Date  . Anxiety   . Cataract   . Colonic polyp 2005 & 2013  . DDD (degenerative disc disease)   . Depression   . GERD (gastroesophageal reflux disease)   . H/O echocardiogram    before 2000, no need for  f/u /w cardiac   . Heart murmur    MVP- echo- 2010, no symptoms   . Memory disorder 09/19/2017  . Mononucleosis 1971  . Osteoporosis   . Pinched nerve in neck   . Sleep apnea    Past Surgical History:  Procedure Laterality Date  . ANTERIOR CERVICAL DECOMP/DISCECTOMY FUSION N/A 05/20/2013   Procedure: ANTERIOR CERVICAL DECOMPRESSION/DISCECTOMY FUSION 2 LEVELS;  Surgeon: Erline Levine, MD;  Location: [facility];  Service: Neurosurgery;  Laterality: N/A;  Cervical Seven-Thoracic One, Thoracic One-Two Anterior cervical decompression/Diskectomy/Fusion  . CARPAL TUNNEL RELEASE Right   . CATARACT EXTRACTION, BILATERAL  2013   Dr Gershon Crane, Viona Gilmore IOL  . CERVICAL FUSION     2 proceduresr Vertell Limber  . colonoscopy with polypectomy  2013   Dr Deatra Ina  . FRACTURE  SURGERY     L wrist - June 2014  . HERNIA REPAIR    . KNEE SURGERY     Dr Wonda Olds ; bursa cystectomy  . LUMBAR DISC SURGERY     Dr.Stern  . ROTATOR CUFF REPAIR     Dr.Wainer  . UMBILICAL HERNIA REPAIR    . WRIST FRACTURE SURGERY Left 02/2013   Dr. Kathryne Hitch   Family History  Problem Relation Age of Onset  . Diabetes Father   . Coronary artery disease Father        S/P CBAG , no MI -- Dec  . Depression Father   . Diverticulosis Father   . Breast cancer Mother        estorgen receptor positive  . Stroke Mother 24       Dec  . Breast cancer Sister        estrogen receptor positive  . Depression Sister   . Depression Sister   . Depression Sister   . Depression Sister   . Depression Brother   . Bipolar disorder Other        Neice  . Colon polyps Sister   . Colon cancer Neg Hx   . Esophageal cancer Neg Hx   . Pancreatic cancer Neg Hx   . Rectal cancer Neg Hx   . Stomach cancer Neg Hx    Social History   Socioeconomic History  . Marital status: Married    Spouse name: Not on file  . Number of children: Not on file  . Years of education: 16+  . Highest education level: Not on file  Occupational History  . 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 Financial resource strain: Not on file  . Food insecurity:    Worry: Not on file    Inability: Not on file  . Transportation needs:    Medical: Not on file    Non-medical: Not on file  Tobacco Use  . Smoking status: Never Smoker  . Smokeless tobacco: Never Used  Substance and Sexual Activity  . Alcohol use: No    Alcohol/week: 0.0 oz  . Drug use: No  . Sexual activity: Yes    Partners: Male    Birth control/protection: Post-menopausal  Lifestyle  . Physical activity:    Days per week: Not on file    Minutes per session: Not on file  . Stress: Not on file  Relationships  . Social connections:    Talks on phone: Not on file    Gets together: Not on file    Attends religious service: Not on file     Active member of club or organization: Not on file  Attends meetings of clubs or organizations: Not on file    Relationship status: Not on file  Other Topics Concern  . Not on file  Social History Narrative   Lives   Caffeine use:    Right handed     Tobacco Counseling Counseling given: Yes   Clinical Intake:    Activities of Daily Living In your present state of health, do you have any difficulty performing the following activities: 02/04/2018  Hearing? Y  Comment will shedule audiology   Vision? (No Data)  Comment contacts  Difficulty concentrating or making decisions? N  Walking or climbing stairs? N  Dressing or bathing? N  Doing errands, shopping? N  Preparing Food and eating ? N  Using the Toilet? N  In the past six months, have you accidently leaked urine? N  Do you have problems with loss of bowel control? N  Managing your Medications? N  Managing your Finances? N  Housekeeping or managing your Housekeeping? N  Some recent data might be hidden     Immunizations and Health Maintenance Immunization History  Administered Date(s) Administered  . Influenza, High Dose Seasonal PF 06/10/2016  . Influenza-Unspecified 06/10/2016  . Pneumococcal Conjugate-13 02/04/2018  . Td 06/09/2007  . Zoster Recombinat (Shingrix) 07/06/2017, 09/30/2017   Health Maintenance Due  Topic Date Due  . Hepatitis C Screening  01-20-1951  . PNA vac Low Risk Adult (1 of 2 - PCV13) 03/24/2015    Patient Care Team: [name], [name], MD as PCP - General (Family Medicine) Nunzio Cobbs, MD as Consulting Physician (Obstetrics and Gynecology)  Indicate any recent Medical Services you may have received from other than [facility] providers in the past year (date may be approximate).     Assessment:   This is a routine wellness examination for [facility].  Hearing/Vision screen  Hearing Screening   125Hz  250Hz  500Hz  1000Hz  2000Hz  3000Hz  4000Hz  6000Hz  8000Hz   Right ear:     100        Left ear:    100        Comments: Hearing issues  Going to audiologist to get hearing check   Vision Screening Comments: Vision issues  Wears contacts Was going to [employer]   Dietary issues and exercise activities discussed: Current Exercise Habits: Home exercise routine, Type of exercise: walking, Time (Minutes): 30(history of walking very fast ), Intensity: Moderate  Goals    . Exercise 150 min/wk Moderate Activity     Start walking every day in the neighborhood;       Depression Screen PHQ 2/9 Scores 02/04/2018  PHQ - 2 Score 0    Fall Risk Fall Risk  02/04/2018  Falls in the past year? No     Cognitive Function: MMSE - Mini Mental State Exam 02/04/2018 09/19/2017  Not completed: (No Data) -  Orientation to time - 5  Orientation to Place - 5  Registration - 3  Attention/ Calculation - 5  Recall - 3  Language- name 2 objects - 2  Language- repeat - 1  Language- follow 3 step command - 3  Language- read & follow direction - 1  Write a sentence - 1  Copy design - 1  Total score - 30    has been on Aricept since last June 09/19/2017 Missed apt; family stated she "forgets" things at the beach Will be having neuropsych testing soon    Screening Tests Health Maintenance  Topic Date Due  . Hepatitis C Screening  1951-06-09  . PNA vac Low Risk Adult (1 of 2 - PCV13) 03/24/2015  . TETANUS/TDAP  02/05/2019 (Originally 06/08/2017)  . INFLUENZA VACCINE  04/30/2018  . MAMMOGRAM  04/30/2019  . COLONOSCOPY  07/17/2022  . DEXA SCAN  Completed         Plan:      PCP Notes   Health Maintenance Ordered hep c for future draw Needs tdap with newborn, agreed to take it at the pharmacy  Prevnar given today   Abnormal Screens  Being followed up for memory issues per the c/o of her children. No issues noted today. To have neuropsych testing soon  Stressors; moved x 67 yo to assist with disabled grandchild   Referrals  none  Patient concerns; As  noted  Nurse Concerns; As noted  Next PCP apt neurology tomorrow       I have personally reviewed and noted the following in the patient's chart:   . Medical and social history . Use of alcohol, tobacco or illicit drugs  . Current medications and supplements . Functional ability and status . Nutritional status . Physical activity . Advanced directives . List of other physicians . Hospitalizations, surgeries, and ER visits in previous 12 months . Vitals . Screenings to include cognitive, depression, and falls . Referrals and appointments  In addition, I have reviewed and discussed with patient certain preventive protocols, quality metrics, and best practice recommendations. A written personalized care plan for preventive services as well as general preventive health recommendations were provided to patient.     Wynetta Fines, RN   02/04/2018

## 2018-02-04 ENCOUNTER — Ambulatory Visit (INDEPENDENT_AMBULATORY_CARE_PROVIDER_SITE_OTHER): Payer: Medicare Other

## 2018-02-04 VITALS — BP 112/70 | HR 71 | Ht 59.0 in | Wt 135.0 lb

## 2018-02-04 DIAGNOSIS — Z23 Encounter for immunization: Secondary | ICD-10-CM | POA: Diagnosis not present

## 2018-02-04 DIAGNOSIS — Z0000 Encounter for general adult medical examination without abnormal findings: Secondary | ICD-10-CM

## 2018-02-04 DIAGNOSIS — Z1159 Encounter for screening for other viral diseases: Secondary | ICD-10-CM

## 2018-02-04 NOTE — Patient Instructions (Addendum)
Ms. Skowron , Thank you for taking time to come for your Medicare Wellness Visit. I appreciate your ongoing commitment to your health goals. Please review the following plan we discussed and let me know if I can assist you in the future.   Medicare now request all "baby boomers" test for possible exposure to Hepatitis C. Many may have been exposed due to dental work, tatoo's, vaccinations when young. The Hepatitis C virus is dormant for many years and then sometimes will cause liver cancer. If you gave blood in the past 15 years, you were most likely checked for Hep C. If you rec'd blood; you may want to consider testing or if you are high risk for any other reason.    A Tetanus is recommended every 10 years. Medicare covers a tetanus if you have a cut or wound; otherwise, there may be a charge. If you had not had a tetanus with pertusses, known as the Tdap, you can take this anytime.   The Centers for Disease Control are now recommending 2 pneumonia vaccinations after 54. The first is the Prevnar 13. This helps to boost your immunity to community acquired pneumonia as well as some protection from bacterial pneumonia  The 2nd is the pneumovax 23, which offers more broad protection!  Please consider taking these as this is your best protection against pneumonia.  Ad8 score reviewed for issues:  Issues making decisions:  Less interest in hobbies / activities:  Repeats questions, stories (family complaining):  Trouble using ordinary gadgets (microwave, computer, phone):  Forgets the month or year:   Mismanaging finances:   Remembering appts:  Daily problems with thinking and/or memory: Ad8 score is=0   Deaf & Hard of Hearing Division Services - can assist with hearing aid x 1  No reviews  [facility]  [phone]  [phone] [address]  [email or web address]   These are the goals we discussed: Goals    . Exercise 150 min/wk Moderate Activity     Start walking every day in the neighborhood;        This is a list of the screening recommended for you and due dates:  Health Maintenance  Topic Date Due  .  Hepatitis C: One time screening is recommended by Center for Disease Control  (CDC) for  adults born from [birth year] through 1966.   1950-12-27  . Pneumonia vaccines (1 of 2 - PCV13) 03/24/2015  . Tetanus Vaccine  02/05/2019*  . Flu Shot  04/30/2018  . Mammogram  04/30/2019  . Colon Cancer Screening  07/17/2022  . DEXA scan (bone density measurement)  Completed  *Topic was postponed. The date shown is not the original due date.      Fall Prevention in the Home Falls can cause injuries. They can happen to people of all ages. There are many things you can do to make your home safe and to help prevent falls. What can I do on the outside of my home?  Regularly fix the edges of walkways and driveways and fix any cracks.  Remove anything that might make you trip as you walk through a door, such as a raised step or threshold.  Trim any bushes or trees on the path to your home.  Use bright outdoor lighting.  Clear any walking paths of anything that might make someone trip, such as rocks or tools.  Regularly check to see if handrails are loose or broken. Make sure that both sides of  any steps have handrails.  Any raised decks and porches should have guardrails on the edges.  Have any leaves, snow, or ice cleared regularly.  Use sand or salt on walking paths during winter.  Clean up any spills in your garage right away. This includes oil or grease spills. What can I do in the bathroom?  Use night lights.  Install grab bars by the toilet and in the tub and shower. Do not use towel bars as grab bars.  Use non-skid  mats or decals in the tub or shower.  If you need to sit down in the shower, use a plastic, non-slip stool.  Keep the floor dry. Clean up any water that spills on the floor as soon as it happens.  Remove soap buildup in the tub or shower regularly.  Attach bath mats securely with double-sided non-slip rug tape.  Do not have throw rugs and other things on the floor that can make you trip. What can I do in the bedroom?  Use night lights.  Make sure that you have a light by your bed that is easy to reach.  Do not use any sheets or blankets that are too big for your bed. They should not hang down onto the floor.  Have a firm chair that has side arms. You can use this for support while you get dressed.  Do not have throw rugs and other things on the floor that can make you trip. What can I do in the kitchen?  Clean up any spills right away.  Avoid walking on wet floors.  Keep items that you use a lot in easy-to-reach places.  If you need to reach something above you, use a strong step stool that has a grab bar.  Keep electrical cords out of the way.  Do not use floor polish or wax that makes floors slippery. If you must use wax, use non-skid floor wax.  Do not have throw rugs and other things on the floor that can make you trip. What can I do with my stairs?  Do not leave any items on the stairs.  Make sure that there are handrails on both sides of the stairs and use them. Fix handrails that are broken or loose. Make sure that handrails are as long as the stairways.  Check any carpeting to make sure that it is firmly attached to the stairs. Fix any carpet that is loose or worn.  Avoid having throw rugs at the top or bottom of the stairs. If you do have throw rugs, attach them to the floor with carpet tape.  Make sure that you have a light switch at the top of the stairs and the bottom of the stairs. If you do not have them, ask someone to add them for you. What else can I  do to help prevent falls?  Wear shoes that: ? Do not have high heels. ? Have rubber bottoms. ? Are comfortable and fit you well. ? Are closed at the toe. Do not wear sandals.  If you use a stepladder: ? Make sure that it is fully opened. Do not climb a closed stepladder. ? Make sure that both sides of the stepladder are locked into place. ? Ask someone to hold it for you, if possible.  Clearly mark and make sure that you can see: ? Any grab bars or handrails. ? First and last steps. ? Where the edge of each step is.  Use tools that help you  move around (mobility aids) if they are needed. These include: ? Canes. ? Walkers. ? Scooters. ? Crutches.  Turn on the lights when you go into a dark area. Replace any light bulbs as soon as they burn out.  Set up your furniture so you have a clear path. Avoid moving your furniture around.  If any of your floors are uneven, fix them.  If there are any pets around you, be aware of where they are.  Review your medicines with your doctor. Some medicines can make you feel dizzy. This can increase your chance of falling. Ask your doctor what other things that you can do to help prevent falls. This information is not intended to replace advice given to you by your health care provider. Make sure you discuss any questions you have with your health care provider. Document Released: 07/13/2009 Document Revised: 02/22/2016 Document Reviewed: 10/21/2014 Elsevier Interactive Patient Education  2018 [place] Maintenance, Female Adopting a healthy lifestyle and getting preventive care can go a long way to promote health and wellness. Talk with your health care provider about what schedule of regular examinations is right for you. This is a good chance for you to check in with your provider about disease prevention and staying healthy. In between checkups, there are plenty of things you can do on your own. Experts have done a lot of  research about which lifestyle changes and preventive measures are most likely to keep you healthy. Ask your health care provider for more information. Weight and diet Eat a healthy diet  Be sure to include plenty of vegetables, fruits, low-fat dairy products, and lean protein.  Do not eat a lot of foods high in solid fats, added sugars, or salt.  Get regular exercise. This is one of the most important things you can do for your health. ? Most adults should exercise for at least 150 minutes each week. The exercise should increase your heart rate and make you sweat (moderate-intensity exercise). ? Most adults should also do strengthening exercises at least twice a week. This is in addition to the moderate-intensity exercise.  Maintain a healthy weight  Body mass index (BMI) is a measurement that can be used to identify possible weight problems. It estimates body fat based on height and weight. Your health care provider can help determine your BMI and help you achieve or maintain a healthy weight.  For females 46 years of age and older: ? A BMI below 18.5 is considered underweight. ? A BMI of 18.5 to 24.9 is normal. ? A BMI of 25 to 29.9 is considered overweight. ? A BMI of 30 and above is considered obese.  Watch levels of cholesterol and blood lipids  You should start having your blood tested for lipids and cholesterol at 68 years of age, then have this test every 5 years.  You may need to have your cholesterol levels checked more often if: ? Your lipid or cholesterol levels are high. ? You are older than 68 years of age. ? You are at high risk for heart disease.  Cancer screening Lung Cancer  Lung cancer screening is recommended for adults 86-73 years old who are at high risk for lung cancer because of a history of smoking.  A yearly low-dose CT scan of the lungs is recommended for people who: ? Currently smoke. ? Have quit within the past 15 years. ? Have at least a  30-pack-year history of smoking. A pack year is smoking  an average of one pack of cigarettes a day for 1 year.  Yearly screening should continue until it has been 15 years since you quit.  Yearly screening should stop if you develop a health problem that would prevent you from having lung cancer treatment.  Breast Cancer  Practice breast self-awareness. This means understanding how your breasts normally appear and feel.  It also means doing regular breast self-exams. Let your health care provider know about any changes, no matter how small.  If you are in your 20s or 30s, you should have a clinical breast exam (CBE) by a health care provider every 1-3 years as part of a regular health exam.  If you are 20 or older, have a CBE every year. Also consider having a breast X-ray (mammogram) every year.  If you have a family history of breast cancer, talk to your health care provider about genetic screening.  If you are at high risk for breast cancer, talk to your health care provider about having an MRI and a mammogram every year.  Breast cancer gene (BRCA) assessment is recommended for women who have family members with BRCA-related cancers. BRCA-related cancers include: ? Breast. ? Ovarian. ? Tubal. ? Peritoneal cancers.  Results of the assessment will determine the need for genetic counseling and BRCA1 and BRCA2 testing.  Cervical Cancer Your health care provider may recommend that you be screened regularly for cancer of the pelvic organs (ovaries, uterus, and vagina). This screening involves a pelvic examination, including checking for microscopic changes to the surface of your cervix (Pap test). You may be encouraged to have this screening done every 3 years, beginning at age 28.  For women ages 22-65, health care providers may recommend pelvic exams and Pap testing every 3 years, or they may recommend the Pap and pelvic exam, combined with testing for human papilloma virus (HPV), every  5 years. Some types of HPV increase your risk of cervical cancer. Testing for HPV may also be done on women of any age with unclear Pap test results.  Other health care providers may not recommend any screening for nonpregnant women who are considered low risk for pelvic cancer and who do not have symptoms. Ask your health care provider if a screening pelvic exam is right for you.  If you have had past treatment for cervical cancer or a condition that could lead to cancer, you need Pap tests and screening for cancer for at least 20 years after your treatment. If Pap tests have been discontinued, your risk factors (such as having a new sexual partner) need to be reassessed to determine if screening should resume. Some women have medical problems that increase the chance of getting cervical cancer. In these cases, your health care provider may recommend more frequent screening and Pap tests.  Colorectal Cancer  This type of cancer can be detected and often prevented.  Routine colorectal cancer screening usually begins at 68 years of age and continues through 68 years of age.  Your health care provider may recommend screening at an earlier age if you have risk factors for colon cancer.  Your health care provider may also recommend using home test kits to check for hidden blood in the stool.  A small camera at the end of a tube can be used to examine your colon directly (sigmoidoscopy or colonoscopy). This is done to check for the earliest forms of colorectal cancer.  Routine screening usually begins at age 7.  Direct examination of the  colon should be repeated every 5-10 years through 68 years of age. However, you may need to be screened more often if early forms of precancerous polyps or small growths are found.  Skin Cancer  Check your skin from head to toe regularly.  Tell your health care provider about any new moles or changes in moles, especially if there is a change in a mole's shape  or color.  Also tell your health care provider if you have a mole that is larger than the size of a pencil eraser.  Always use sunscreen. Apply sunscreen liberally and repeatedly throughout the day.  Protect yourself by wearing long sleeves, pants, a wide-brimmed hat, and sunglasses whenever you are outside.  Heart disease, diabetes, and high blood pressure  High blood pressure causes heart disease and increases the risk of stroke. High blood pressure is more likely to develop in: ? People who have blood pressure in the high end of the normal range (130-139/85-89 mm Hg). ? People who are overweight or obese. ? People who are African American.  If you are 22-43 years of age, have your blood pressure checked every 3-5 years. If you are 37 years of age or older, have your blood pressure checked every year. You should have your blood pressure measured twice-once when you are at a hospital or clinic, and once when you are not at a hospital or clinic. Record the average of the two measurements. To check your blood pressure when you are not at a hospital or clinic, you can use: ? An automated blood pressure machine at a pharmacy. ? A home blood pressure monitor.  If you are between 55 years and 57 years old, ask your health care provider if you should take aspirin to prevent strokes.  Have regular diabetes screenings. This involves taking a blood sample to check your fasting blood sugar level. ? If you are at a normal weight and have a low risk for diabetes, have this test once every three years after 68 years of age. ? If you are overweight and have a high risk for diabetes, consider being tested at a younger age or more often. Preventing infection Hepatitis B  If you have a higher risk for hepatitis B, you should be screened for this virus. You are considered at high risk for hepatitis B if: ? You were born in a country where hepatitis B is common. Ask your health care provider which countries  are considered high risk. ? Your parents were born in a high-risk country, and you have not been immunized against hepatitis B (hepatitis B vaccine). ? You have HIV or AIDS. ? You use needles to inject street drugs. ? You live with someone who has hepatitis B. ? You have had sex with someone who has hepatitis B. ? You get hemodialysis treatment. ? You take certain medicines for conditions, including cancer, organ transplantation, and autoimmune conditions.  Hepatitis C  Blood testing is recommended for: ? Everyone born from [birth year] through 1966. ? Anyone with known risk factors for hepatitis C.  Sexually transmitted infections (STIs)  You should be screened for sexually transmitted infections (STIs) including gonorrhea and chlamydia if: ? You are sexually active and are younger than 68 years of age. ? You are older than 68 years of age and your health care provider tells you that you are at risk for this type of infection. ? Your sexual activity has changed since you were last screened and you are at an  increased risk for chlamydia or gonorrhea. Ask your health care provider if you are at risk.  If you do not have HIV, but are at risk, it may be recommended that you take a prescription medicine daily to prevent HIV infection. This is called pre-exposure prophylaxis (PrEP). You are considered at risk if: ? You are sexually active and do not regularly use condoms or know the HIV status of your partner(s). ? You take drugs by injection. ? You are sexually active with a partner who has HIV.  Talk with your health care provider about whether you are at high risk of being infected with HIV. If you choose to begin PrEP, you should first be tested for HIV. You should then be tested every 3 months for as long as you are taking PrEP. Pregnancy  If you are premenopausal and you may become pregnant, ask your health care provider about preconception counseling.  If you may become pregnant, take 400  to 800 micrograms (mcg) of folic acid every day.  If you want to prevent pregnancy, talk to your health care provider about birth control (contraception). Osteoporosis and menopause  Osteoporosis is a disease in which the bones lose minerals and strength with aging. This can result in serious bone fractures. Your risk for osteoporosis can be identified using a bone density scan.  If you are 50 years of age or older, or if you are at risk for osteoporosis and fractures, ask your health care provider if you should be screened.  Ask your health care provider whether you should take a calcium or vitamin D supplement to lower your risk for osteoporosis.  Menopause may have certain physical symptoms and risks.  Hormone replacement therapy may reduce some of these symptoms and risks. Talk to your health care provider about whether hormone replacement therapy is right for you. Follow these instructions at home:  Schedule regular health, dental, and eye exams.  Stay current with your immunizations.  Do not use any tobacco products including cigarettes, chewing tobacco, or electronic cigarettes.  If you are pregnant, do not drink alcohol.  If you are breastfeeding, limit how much and how often you drink alcohol.  Limit alcohol intake to no more than 1 drink per day for nonpregnant women. One drink equals 12 ounces of beer, 5 ounces of wine, or 1 ounces of hard liquor.  Do not use street drugs.  Do not share needles.  Ask your health care provider for help if you need support or information about quitting drugs.  Tell your health care provider if you often feel depressed.  Tell your health care provider if you have ever been abused or do not feel safe at home. This information is not intended to replace advice given to you by your health care provider. Make sure you discuss any questions you have with your health care provider. Document Released: 04/01/2011 Document Revised: 02/22/2016  Document Reviewed: 06/20/2015 Elsevier Interactive Patient Education  [employer].

## 2018-02-05 ENCOUNTER — Ambulatory Visit: Payer: Medicare Other | Admitting: Psychology

## 2018-02-05 ENCOUNTER — Encounter: Payer: Self-pay | Admitting: Psychology

## 2018-02-05 DIAGNOSIS — R413 Other amnesia: Secondary | ICD-10-CM

## 2018-02-05 NOTE — Progress Notes (Signed)
   Neuropsychology Note  Jessica Casey completed 60 minutes of neuropsychological testing with technician, Milana Kidney, BS, under the supervision of Dr. Macarthur Critchley, Licensed Psychologist. The patient did not appear overtly distressed by the testing session, per behavioral observation or via self-report to the technician. Rest breaks were offered.   Clinical Decision Making: In considering the patient's current level of functioning, level of presumed impairment, nature of symptoms, emotional and behavioral responses during the interview, level of literacy, and observed level of motivation/effort, a battery of tests was selected and communicated to the psychometrician.  Communication between the psychologist and technician was ongoing throughout the testing session and changes were made as deemed necessary based on patient performance on testing, technician observations and additional pertinent factors such as those listed above.  Jessica Casey will return within approximately 2 weeks for an interactive feedback session with Dr. Si Raider at which time her test performances, clinical impressions and treatment recommendations will be reviewed in detail. The patient understands she can contact our office should she require our assistance before this time.  35 minutes spent performing neuropsychological evaluation services/clinical decision making (psychologist). [CPT 09323] 60 minutes spent face-to-face with patient administering standardized tests, 30 minutes spent scoring (technician). [CPT [id number], 55732]  Full report to follow.
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
## 2018-02-10 NOTE — Progress Notes (Signed)
I have reviewed documentation from this visit and I agree with recommendations given.  Maire Govan G. Kemisha Bonnette, MD  [facility]. [place] office.   

## 2018-02-11 ENCOUNTER — Encounter: Payer: Self-pay | Admitting: Family Medicine

## 2018-02-11 ENCOUNTER — Ambulatory Visit: Payer: Medicare Other | Admitting: Family Medicine

## 2018-02-11 VITALS — BP 125/77 | HR 71 | Temp 98.7°F | Resp 12 | Ht 59.0 in | Wt 135.0 lb

## 2018-02-11 DIAGNOSIS — J310 Chronic rhinitis: Secondary | ICD-10-CM

## 2018-02-11 DIAGNOSIS — Z1159 Encounter for screening for other viral diseases: Secondary | ICD-10-CM

## 2018-02-11 DIAGNOSIS — R05 Cough: Secondary | ICD-10-CM

## 2018-02-11 DIAGNOSIS — H9192 Unspecified hearing loss, left ear: Secondary | ICD-10-CM | POA: Diagnosis not present

## 2018-02-11 DIAGNOSIS — R053 Chronic cough: Secondary | ICD-10-CM

## 2018-02-11 LAB — CBC WITH DIFFERENTIAL/PLATELET
BASOS ABS: 0 10*3/uL (ref 0.0–0.1)
Basophils Relative: 0.6 % (ref 0.0–3.0)
Eosinophils Absolute: 0.2 10*3/uL (ref 0.0–0.7)
Eosinophils Relative: 2.6 % (ref 0.0–5.0)
HCT: 37.2 % (ref 36.0–46.0)
HEMOGLOBIN: 12.3 g/dL (ref 12.0–15.0)
Lymphocytes Relative: 48 % — ABNORMAL HIGH (ref 12.0–46.0)
Lymphs Abs: 3.1 10*3/uL (ref 0.7–4.0)
MCHC: 33.2 g/dL (ref 30.0–36.0)
MCV: 99.2 fl (ref 78.0–100.0)
MONO ABS: 0.5 10*3/uL (ref 0.1–1.0)
MONOS PCT: 8 % (ref 3.0–12.0)
NEUTROS PCT: 40.8 % — AB (ref 43.0–77.0)
Neutro Abs: 2.7 10*3/uL (ref 1.4–7.7)
Platelets: 225 10*3/uL (ref 150.0–400.0)
RBC: 3.75 Mil/uL — AB (ref 3.87–5.11)
RDW: 14.1 % (ref 11.5–15.5)
WBC: 6.5 10*3/uL (ref 4.0–10.5)

## 2018-02-11 MED ORDER — FLUTICASONE PROPIONATE 50 MCG/ACT NA SUSP: 1.0000 | Freq: Two times a day (BID) | NASAL | 3 refills | Status: DC

## 2018-02-11 NOTE — Patient Instructions (Addendum)
A few things to remember from today's visit:   Hearing loss of left ear, unspecified hearing loss type - Plan: Ambulatory referral to ENT  Chronic cough - Plan: Ambulatory referral to ENT, CBC with Differential/Platelet  Rhinitis, unspecified type - Plan: fluticasone (FLONASE) 50 MCG/ACT nasal spray  ? Heartburn,allergies,asthma,COPD among some that can cause chronic cough.  I do not think x-ray is needed today. Because he also complains about left ear fullness sensation, I think ENT referral is more appropriate at this time. Flonase nasal spray and Zyrtec 10 mg daily recommended for now to treat possible allergies. If this does not help we could consider omeprazole.   Please be sure medication list is accurate. If a new problem present, please set up appointment sooner than planned today.

## 2018-02-11 NOTE — Progress Notes (Signed)
ACUTE VISIT  HPI:  Chief Complaint  Patient presents with  . Cough    with mucus. started off and on since February  . Nasal Congestion    left ear feels clogged, unable to hear well    [name] is a 67 y.o.female here today complaining of intermittent nonproductive cough since 10/2017. She has not noted associated wheezing or dyspnea. No chills, fever, or body aches. She has occasional heartburn, no nausea or vomiting. Mild nasal congestion and rhinorrhea. Also complaining of left ear "clugged off" also since 10/2017 but seems to be getting worse for the past 2 weeks. No ear ache or ear drainage.  Intermittent tinnitus,bilateral for the past 2-3 weeks.   She has had some night sweats for the past few weeks, no abnormal weight loss.   She has not taken any OTC medication. No known history of seasonal allergies. I saw her on 11/25/2017, when oral antibiotic was prescribed despite of negative CXR.   Cough  The current episode started more than 1 month ago. The problem has been waxing and waning. The problem occurs every few hours. The cough is non-productive. Associated symptoms include ear congestion, heartburn, nasal congestion, postnasal drip, rhinorrhea and sweats. Pertinent negatives include no chest pain, ear pain, eye redness, fever, headaches, hemoptysis, myalgias, rash, sore throat, shortness of breath, weight loss or wheezing. Risk factors for lung disease include smoking/tobacco exposure. She has tried a beta-agonist inhaler for the symptoms. The treatment provided no relief. There is no history of asthma or environmental allergies.   She has not identified exacerbating or alleviating factors. I have recommended Albuterol inhaler, she discontinued because did not notice any difference.  No history of tobacco use but has been exposed to secondhand smoking.    Lab Results  Component Value Date   WBC 10.2 02/28/2016   HGB 11.8 (L) 02/28/2016   HCT  36.0 02/28/2016   MCV 98.1 02/28/2016   PLT 249.0 02/28/2016     Review of Systems  Constitutional: Positive for fatigue. Negative for activity change, appetite change, fever and weight loss.  HENT: Positive for congestion, hearing loss, postnasal drip, rhinorrhea and tinnitus. Negative for ear pain, facial swelling, mouth sores, sinus pressure, sore throat, trouble swallowing and voice change.   Eyes: Negative for redness and itching.  Respiratory: Positive for cough. Negative for hemoptysis, shortness of breath and wheezing.   Cardiovascular: Negative for chest pain and leg swelling.  Gastrointestinal: Positive for heartburn. Negative for abdominal pain, nausea and vomiting.  Genitourinary: Negative for decreased urine volume and hematuria.  Musculoskeletal: Negative for myalgias.  Skin: Negative for rash.  Allergic/Immunologic: Negative for environmental allergies.  Neurological: Negative for weakness and headaches.  Hematological: Negative for adenopathy. Does not bruise/bleed easily.      Current Outpatient Medications on File Prior to Visit  Medication Sig Dispense Refill  . albuterol (PROVENTIL HFA;VENTOLIN HFA) 108 (90 Base) MCG/ACT inhaler Inhale 2 puffs into the lungs every 6 (six) hours as needed for wheezing or shortness of breath. 1 Inhaler 0  . alendronate (FOSAMAX) 70 MG tablet TAKE 1 TABLET EVERY SUNDAY. TAKE WITH A FULL GLASS OF  WATER ON AN EMPTY STOMACH 12 tablet 3  . aspirin 81 MG tablet Take 81 mg by mouth daily.      . Biotin 300 MCG TABS Take 300 mcg by mouth daily.      Marland Kitchen buPROPion (WELLBUTRIN XL) 300 MG 24 hr tablet Take 1 tablet (300  mg total) by mouth daily. 90 tablet 3  . Cholecalciferol (VITAMIN D) 2000 UNITS CAPS Take by mouth.    . conjugated estrogens (PREMARIN) vaginal cream Use pea size amount to urethra twice a week at bedtime. 30 g 1  . diclofenac sodium (VOLTAREN) 1 % GEL Apply 2 g topically 4 (four) times daily. 3 Tube 3  . donepezil (ARICEPT) 10  MG tablet Take 1 tablet (10 mg total) by mouth at bedtime. 90 tablet 3  . escitalopram (LEXAPRO) 20 MG tablet Take 1 tablet (20 mg total) by mouth daily. 90 tablet 3  . Omega-3 Fatty Acids (FISH OIL PO) Take by mouth.     No current facility-administered medications on file prior to visit.      Past Medical History:  Diagnosis Date  . Anxiety   . Cataract   . Colonic polyp 2005 & 2013  . DDD (degenerative disc disease)   . Depression   . GERD (gastroesophageal reflux disease)   . H/O echocardiogram    before 2000, no need for f/u /w cardiac   . Heart murmur    MVP- echo- 2010, no symptoms   . Memory disorder 09/19/2017  . Mononucleosis 1971  . Osteoporosis   . Pinched nerve in neck   . Sleep apnea    Allergies  Allergen Reactions  . Gabapentin     REACTION: TOUNGE SWELLING, ITCHING    Social History   Socioeconomic History  . Marital status: Married    Spouse name: Not on file  . Number of children: Not on file  . Years of education: 16+  . Highest education level: Not on file  Occupational History  . 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 Financial resource strain: Not on file  . Food insecurity:    Worry: Not on file    Inability: Not on file  . Transportation needs:    Medical: Not on file    Non-medical: Not on file  Tobacco Use  . Smoking status: Never Smoker  . Smokeless tobacco: Never Used  Substance and Sexual Activity  . Alcohol use: No    Alcohol/week: 0.0 oz  . Drug use: No  . Sexual activity: Yes    Partners: Male    Birth control/protection: Post-menopausal  Lifestyle  . Physical activity:    Days per week: Not on file    Minutes per session: Not on file  . Stress: Not on file  Relationships  . Social connections:    Talks on phone: Not on file    Gets together: Not on file    Attends religious service: Not on file    Active member of club or organization: Not on file    Attends meetings of clubs or organizations: Not on file      Relationship status: Not on file  Other Topics Concern  . Not on file  Social History Narrative   Lives   Caffeine use:    Right handed     Vitals:   02/11/18 1154  BP: 125/77  Pulse: 71  Resp: 12  Temp: 98.7 F (37.1 C)  SpO2: 98%   Body mass index is 27.27 kg/m.   Physical Exam  Nursing note and vitals reviewed. Constitutional: She is oriented to person, place, and time. She appears well-developed. She does not appear ill. No distress.  HENT:  Head: Normocephalic and atraumatic.  Right Ear: Tympanic membrane, external ear and ear canal normal. Decreased hearing is noted.  Left  Ear: Tympanic membrane, external ear and ear canal normal. Decreased hearing is noted.  Nose: Rhinorrhea present.  Mouth/Throat: Oropharynx is clear and moist and mucous membranes are normal.  Postnasal drainage.  Eyes: Conjunctivae are normal.  Cardiovascular: Normal rate and regular rhythm.  No murmur heard. Respiratory: Effort normal and breath sounds normal. No respiratory distress.  Musculoskeletal: She exhibits no edema.  Lymphadenopathy:    She has no cervical adenopathy.  Neurological: She is alert and oriented to person, place, and time. She has normal strength. Gait normal.  Skin: Skin is warm. No rash noted. No erythema.  Psychiatric: She has a normal mood and affect.  Well groomed, good eye contact.      ASSESSMENT AND PLAN:  Ms. Jessica Casey was seen today for cough and nasal congestion.  Diagnoses and all orders for this visit:  Hearing loss of left ear, unspecified hearing loss type  Bilateral but L>R, gradual, so we agreed on holding on oral steroids. ENT evaluation will be arranged.   Hearing Screening   125Hz  250Hz  500Hz  1000Hz  2000Hz  3000Hz  4000Hz  6000Hz  8000Hz   Right ear:  Fail Fail Fail Pass  Fail    Left ear:  FAIL Fail Fail Fail  Fail      -     Ambulatory referral to ENT  Chronic cough  Possible etiologies discussed: Allergies,COPD/asthma,GERD, post  nasal drainage among some. CXR has been done a few months ago while having symptoms and negative otherwise. ENT referral placed (hearing loss as well)..  -     Ambulatory referral to ENT -     CBC with Differential/Platelet  Rhinitis, unspecified type  Nasal irrigations with saline. Flonase nasal spray may help.  -     fluticasone (FLONASE) 50 MCG/ACT nasal spray; Place 1 spray into both nostrils 2 (two) times daily.  Encounter for hepatitis C screening test for low risk patient -     Hepatitis C Antibody        Jessica Westmoreland G. [name], MD  [facility]. [place] office.

## 2018-02-12 LAB — HEPATITIS C ANTIBODY
HEP C AB: NONREACTIVE
SIGNAL TO CUT-OFF: 0.03 (ref <1.00)

## 2018-02-15 ENCOUNTER — Encounter: Payer: Self-pay | Admitting: Family Medicine
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
## 2018-03-10 ENCOUNTER — Ambulatory Visit (INDEPENDENT_AMBULATORY_CARE_PROVIDER_SITE_OTHER): Payer: Medicare Other | Admitting: Psychology

## 2018-03-10 ENCOUNTER — Encounter: Payer: Self-pay | Admitting: Psychology

## 2018-03-10 ENCOUNTER — Encounter: Payer: Self-pay | Admitting: Family Medicine

## 2018-03-10 ENCOUNTER — Telehealth: Payer: Self-pay | Admitting: Neurology

## 2018-03-10 DIAGNOSIS — F32A Depression, unspecified: Secondary | ICD-10-CM

## 2018-03-10 DIAGNOSIS — F329 Major depressive disorder, single episode, unspecified: Secondary | ICD-10-CM

## 2018-03-10 DIAGNOSIS — R413 Other amnesia: Secondary | ICD-10-CM | POA: Diagnosis not present

## 2018-03-10 DIAGNOSIS — F411 Generalized anxiety disorder: Secondary | ICD-10-CM

## 2018-03-10 NOTE — Patient Instructions (Signed)
Clinical Impressions: No cognitive impairment appreciated on formal cognitive evaluation. Severe generalized anxiety disorder; Chronic major depressive disorder. The patient's reported significant and gradual decline in her ability to manage daily affairs is certainly concerning for underlying neurocognitive impairment, but she performed quite well overall on objective testing of cognitive function. In fact, most performances fell within the high average to very superior range. She did demonstrate subtle relative weakness in attention (working memory, initial encoding of non-contextual auditory information). However, test results weren't even indicative of a diagnosis of mild cognitive impairment. She does report severe anxiety and at least mild depression, but these were said to be chronic in nature so I would not necessarily expect them to be affecting her functioning differently now, unless these factors have worsened. I would recommend psychiatry consultation see if current medications need to be changed. I am also concerned about her lack of activity, socialization and mental stimulation. I highly recommend she create and implement a daily schedule which includes activities that provide social interaction, mental stimulation and physical exercise.    Recommendations/Plan:  In addition to the recommendations listed above:  1. She should continue wearing her CPAP nightly in order to reduce the risk apnea related cognitive impairment.  2. Neurocognitive re-evaluation in one year is highly recommended in order to identify/track any cognitive changes over time that would indicate neurodegenerative disorder. If a rapid decline in function is observed or reported, a sooner evaluation may be warranted. 3. She has not found counseling beneficial in the past but if she is open to trying with a different therapist, I am happy to make a referral for her.

## 2018-03-10 NOTE — Telephone Encounter (Signed)
The results of the neuropsychological evaluation does not show evidence of true dementia, the patient appears to have a severe underlying anxiety disorder.    Neuropsychological evaluation results March 10, 2018:   Clinical Impressions: No cognitive impairment appreciated on formal cognitive evaluation. Severe generalized anxiety disorder; Chronic major depressive disorder. The patient's reported significant and gradual decline in her ability to manage daily affairs is certainly concerning for underlying neurocognitive impairment, but she performed quite well overall on objective testing of cognitive function. In fact, most performances fell within the high average to very superior range. She did demonstrate subtle relative weakness in attention (working memory, initial encoding of non-contextual auditory information). However, test results weren't even indicative of a diagnosis of mild cognitive impairment. She does report severe anxiety and at least mild depression, but these were said to be chronic in nature so I would not necessarily expect them to be affecting her functioning differently now, unless these factors have worsened. I would recommend psychiatry consultation see if current medications need to be changed. I am also concerned about her lack of activity, socialization and mental stimulation. I highly recommend she create and implement a daily schedule which includes activities that provide social interaction, mental stimulation and physical exercise.    Recommendations/Plan:  In addition to the recommendations listed above:  1. She should continue wearing her CPAP nightly in order to reduce the risk apnea related cognitive impairment.  2. Neurocognitive re-evaluation in one year is highly recommended in order to identify/track any cognitive changes over time that would indicate neurodegenerative disorder. If a rapid decline in function is observed or reported, a sooner evaluation may be  warranted. 3. She has not found counseling beneficial in the past but if she is open to trying with a different therapist, I am happy to make a referral for her.

## 2018-03-11 ENCOUNTER — Telehealth: Payer: Self-pay | Admitting: Obstetrics and Gynecology

## 2018-03-11 NOTE — Telephone Encounter (Signed)
Please have patient come in in the morning as a work in.  She may need to fill an Rx prior to going out of town.

## 2018-03-11 NOTE — Progress Notes (Signed)
GYNECOLOGY  VISIT   HPI: 67 y.o.   Married  Caucasian  female   G2P2002 with Patient's last menstrual period was 10/01/1995 (approximate).   here for a UTI.  Having urinary frequency, urgency, and night time urination.  Denies dysuria.  No hematuria.  No fever, nausea, or vomiting.   Had diarrhea last week.   Last UTI was 09/29/18 for Klebsiella, sens to Bactrim DS.   Had a good cognitive function per patient. Started Aricept.   Leaving for the beach.     Urine: trace RBC, 2+ WBC, positive Nitrites, 8.0 pH  GYNECOLOGIC HISTORY: Patient's last menstrual period was 10/01/1995 (approximate). Contraception:  Postmenopasual Menopausal hormone therapy:  Premarin Last mammogram:  04/29/17 BIRADS 1 negative/density b Last pap smear:   07/05/16 Pap smear Negative        OB History    Gravida  2   Para  2   Term  2   Preterm      AB      Living  2     SAB      TAB      Ectopic      Multiple      Live Births                 Patient Active Problem List   Diagnosis Date Noted  . Memory disorder 09/19/2017  . OSA (obstructive sleep apnea) 02/04/2017  . Osteoarthritis, multiple sites 08/06/2016  . Cough 02/28/2016  . Pain of left lower leg 08/08/2015  . Anemia 02/17/2013  . Hypoalbuminemia 02/17/2013  . Unspecified vitamin D deficiency 02/17/2013  . Depression, major, in remission ([place]) 08/02/2009  . Osteopenia 08/02/2009  . Fatigue 08/02/2009  . COLONIC POLYPS, HX OF 08/02/2009  . DEGENERATIVE DISC DISEASE 10/27/2008  . Brachial neuritis or radiculitis 10/27/2008    Past Medical History:  Diagnosis Date  . Anxiety   . Cataract   . Colonic polyp 2005 & 2013  . DDD (degenerative disc disease)   . Depression   . GERD (gastroesophageal reflux disease)   . H/O echocardiogram    before 2000, no need for f/u /w cardiac   . Heart murmur    MVP- echo- 2010, no symptoms   . Memory disorder 09/19/2017  . Mononucleosis 1971  . Osteoporosis   . Pinched  nerve in neck   . Sleep apnea     Past Surgical History:  Procedure Laterality Date  . ANTERIOR CERVICAL DECOMP/DISCECTOMY FUSION N/A 05/20/2013   Procedure: ANTERIOR CERVICAL DECOMPRESSION/DISCECTOMY FUSION 2 LEVELS;  Surgeon: Erline Levine, MD;  Location: [facility];  Service: Neurosurgery;  Laterality: N/A;  Cervical Seven-Thoracic One, Thoracic One-Two Anterior cervical decompression/Diskectomy/Fusion  . CARPAL TUNNEL RELEASE Right   . CATARACT EXTRACTION, BILATERAL  2013   Dr Gershon Crane, V[facility] Gilmore IOL  . CERVICAL FUSION     2 proceduresr Vertell Limber  . colonoscopy with polypectomy  2013   Dr Deatra Ina  . FRACTURE SURGERY     L wrist - June 2014  . HERNIA REPAIR    . KNEE SURGERY     Dr Wonda Olds ; bursa cystectomy  . LUMBAR DISC SURGERY     Dr.Stern  . ROTATOR CUFF REPAIR     Dr.Wainer  . UMBILICAL HERNIA REPAIR    . WRIST FRACTURE SURGERY Left 02/2013   Dr. Kathryne Hitch    Current Outpatient Medications  Medication Sig Dispense Refill  . albuterol (PROVENTIL HFA;VENTOLIN HFA) 108 (90 Base) MCG/ACT inhaler Inhale 2  puffs into the lungs every 6 (six) hours as needed for wheezing or shortness of breath. 1 Inhaler 0  . alendronate (FOSAMAX) 70 MG tablet TAKE 1 TABLET EVERY SUNDAY. TAKE WITH A FULL GLASS OF  WATER ON AN EMPTY STOMACH 12 tablet 3  . aspirin 81 MG tablet Take 81 mg by mouth daily.      . Biotin 300 MCG TABS Take 300 mcg by mouth daily.      Marland Kitchen buPROPion (WELLBUTRIN XL) 300 MG 24 hr tablet Take 1 tablet (300 mg total) by mouth daily. 90 tablet 3  . Cholecalciferol (VITAMIN D) 2000 UNITS CAPS Take by mouth.    . conjugated estrogens (PREMARIN) vaginal cream Use pea size amount to urethra twice a week at bedtime. 30 g 1  . diclofenac sodium (VOLTAREN) 1 % GEL Apply 2 g topically 4 (four) times daily. 3 Tube 3  . donepezil (ARICEPT) 10 MG tablet Take 1 tablet (10 mg total) by mouth at bedtime. 90 tablet 3  . escitalopram (LEXAPRO) 20 MG tablet Take 1 tablet (20 mg total) by mouth daily.  90 tablet 3  . fluticasone (FLONASE) 50 MCG/ACT nasal spray Place 1 spray into both nostrils 2 (two) times daily. 16 g 3  . Omega-3 Fatty Acids (FISH OIL PO) Take by mouth.     No current facility-administered medications for this visit.      ALLERGIES: Gabapentin  Family History  Problem Relation Age of Onset  . Diabetes Father   . Coronary artery disease Father        S/P CBAG , no MI -- Dec  . Depression Father   . Diverticulosis Father   . Breast cancer Mother        estorgen receptor positive  . Stroke Mother 38       Dec  . Breast cancer Sister        estrogen receptor positive  . Depression Sister   . Depression Sister   . Depression Sister   . Depression Sister   . Depression Brother   . Bipolar disorder Other        Neice  . Colon polyps Sister   . Colon cancer Neg Hx   . Esophageal cancer Neg Hx   . Pancreatic cancer Neg Hx   . Rectal cancer Neg Hx   . Stomach cancer Neg Hx     Social History   Socioeconomic History  . Marital status: Married    Spouse name: Not on file  . Number of children: Not on file  . Years of education: 16+  . Highest education level: Not on file  Occupational History  . 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 Financial resource strain: Not on file  . Food insecurity:    Worry: Not on file    Inability: Not on file  . Transportation needs:    Medical: Not on file    Non-medical: Not on file  Tobacco Use  . Smoking status: Never Smoker  . Smokeless tobacco: Never Used  Substance and Sexual Activity  . Alcohol use: No    Alcohol/week: 0.0 oz  . Drug use: No  . Sexual activity: Yes    Partners: Male    Birth control/protection: Post-menopausal  Lifestyle  . Physical activity:    Days per week: Not on file    Minutes per session: Not on file  . Stress: Not on file  Relationships  . Social connections:    Talks on  phone: Not on file    Gets together: Not on file    Attends religious service: Not on file     Active member of club or organization: Not on file    Attends meetings of clubs or organizations: Not on file    Relationship status: Not on file  . Intimate partner violence:    Fear of current or ex partner: Not on file    Emotionally abused: Not on file    Physically abused: Not on file    Forced sexual activity: Not on file  Other Topics Concern  . Not on file  Social History Narrative   Lives   Caffeine use:    Right handed     Review of Systems  Constitutional: Negative.   HENT: Negative.   Eyes: Negative.   Respiratory: Negative.   Cardiovascular: Negative.   Gastrointestinal: Negative.   Endocrine: Negative.   Genitourinary: Positive for frequency and urgency.       Loss of urine with sneeze or cough Night urination  Musculoskeletal: Negative.   Skin: Negative.   Allergic/Immunologic: Negative.   Neurological: Negative.   Hematological: Negative.   Psychiatric/Behavioral: Negative.     PHYSICAL EXAMINATION:    BP (!) 108/58 (BP Location: Right Arm, Patient Position: Sitting, Cuff Size: Normal)   Pulse 92   Temp 98.3 F (36.8 C) (Oral)   Resp 16   Ht 4' 10.5" (1.486 m)   Wt 133 lb (60.3 kg)   LMP 10/01/1995 (Approximate)   BMI 27.32 kg/m     General appearance: alert, cooperative and appears stated age   Pelvic: External genitalia:  no lesions              Urethra:  normal appearing urethra with no masses, tenderness or lesions              Bartholins and Skenes: normal                 Vagina: normal appearing vagina with normal color and discharge, no lesions              Cervix: no lesions                Bimanual Exam:  Uterus:  normal size, contour, position, consistency, mobility, non-tender              Adnexa: no mass, fullness, tenderness       Chaperone was present for exam.  ASSESSMENT  Urinary frequency.  UTI.   PLAN  Urine micro and culture.  Bactrim DS po bid x 3 days.  Hydrate well.  To urgent care if no improvement or  worsening.   An After Visit Summary was printed and given to the patient.  15______ minutes face to face time of which over 50% was spent in counseling.

## 2018-03-11 NOTE — Telephone Encounter (Signed)
Patient thinks she may have a uti and want to come in and leave a urine sample in the morning. An appointment was offered to patient and she declined.

## 2018-03-11 NOTE — Telephone Encounter (Signed)
Spoke with patient, OV rescheduled for 8:15am, is aware will be worked into schedule and is agreeable. Encounter closed.

## 2018-03-11 NOTE — Telephone Encounter (Signed)
Spoke with patient. Reports urinary frequency, pain with urination, urgency and only voiding small amounts. Symptoms started 2 days ago, is leaving out of town tomorrow.   Denies blood in urine, lower back pain, fever/chills.   Recommended OV for further evaluation, OV scheduled for 6/13 at 3:45pm with Dr. Quincy Simmonds. ER precautions reviewed. Advised Dr. Quincy Simmonds will review, I return call if any additional recommendations. Patient verbalizes understanding.   Routing to provider for final review. Patient is agreeable to disposition. Will close encounter.

## 2018-03-12 ENCOUNTER — Other Ambulatory Visit: Payer: Self-pay

## 2018-03-12 ENCOUNTER — Encounter: Payer: Self-pay | Admitting: Obstetrics and Gynecology

## 2018-03-12 ENCOUNTER — Ambulatory Visit: Payer: Medicare Other | Admitting: Obstetrics and Gynecology

## 2018-03-12 ENCOUNTER — Ambulatory Visit: Payer: Self-pay | Admitting: Obstetrics and Gynecology

## 2018-03-12 VITALS — BP 108/58 | HR 92 | Temp 98.3°F | Resp 16 | Ht 58.5 in | Wt 133.0 lb

## 2018-03-12 DIAGNOSIS — R350 Frequency of micturition: Secondary | ICD-10-CM | POA: Diagnosis not present

## 2018-03-12 LAB — POCT URINALYSIS DIPSTICK
BILIRUBIN UA: NEGATIVE
GLUCOSE UA: NEGATIVE
Ketones, UA: NEGATIVE
Nitrite, UA: POSITIVE
Protein, UA: NEGATIVE
Urobilinogen, UA: 0.2 E.U./dL
pH, UA: 8 (ref 5.0–8.0)

## 2018-03-12 MED ORDER — SULFAMETHOXAZOLE-TRIMETHOPRIM 800-160 MG PO TABS: 1.0000 | ORAL_TABLET | Freq: Two times a day (BID) | ORAL | 0 refills | Status: DC

## 2018-03-12 NOTE — Addendum Note (Signed)
Addended by: Terence Lux A on: 03/12/2018 09:21 AM   Modules accepted: Orders

## 2018-03-12 NOTE — Patient Instructions (Signed)

## 2018-03-13 LAB — URINALYSIS, MICROSCOPIC ONLY: CASTS: NONE SEEN /LPF

## 2018-03-16 LAB — URINE CULTURE

## 2018-03-17 ENCOUNTER — Telehealth: Payer: Self-pay

## 2018-03-17 NOTE — Telephone Encounter (Signed)
-----   Message from Nunzio Cobbs, MD sent at 03/16/2018  5:16 PM EDT ----- Please inform patient of her UTI showing Klebsiella, sensitive to the Bactrim DS.  Her urine also showed some crystals, which can be associated with kidney stones.  She needs to hydrate well and go to urgent care while she is on vacation if she is not improving.

## 2018-03-17 NOTE — Telephone Encounter (Signed)
Left message to call Kaitlyn at [phone]. 

## 2018-03-18 NOTE — Telephone Encounter (Signed)
Patient returning call.

## 2018-03-18 NOTE — Telephone Encounter (Signed)
Spoke with patient. Results given. Patient verbalizes understanding. Patient has completed Bactrim and is no longer symptomatic.Encounter closed.

## 2018-03-19 ENCOUNTER — Encounter: Payer: Self-pay | Admitting: Obstetrics and Gynecology

## 2018-03-24 ENCOUNTER — Ambulatory Visit: Payer: Medicare Other | Admitting: Adult Health

## 2018-03-24 ENCOUNTER — Encounter: Payer: Self-pay | Admitting: Neurology

## 2018-04-05 ENCOUNTER — Encounter: Payer: Self-pay | Admitting: Psychology

## 2018-04-07 DIAGNOSIS — H9192 Unspecified hearing loss, left ear: Secondary | ICD-10-CM | POA: Insufficient documentation

## 2018-04-15 ENCOUNTER — Encounter: Payer: Self-pay | Admitting: Psychology

## 2018-05-01 ENCOUNTER — Ambulatory Visit: Payer: Medicare Other | Admitting: Family Medicine

## 2018-05-01 ENCOUNTER — Encounter: Payer: Self-pay | Admitting: Family Medicine

## 2018-05-01 VITALS — BP 110/62 | HR 99 | Temp 98.6°F | Wt 136.6 lb

## 2018-05-01 DIAGNOSIS — M159 Polyosteoarthritis, unspecified: Secondary | ICD-10-CM

## 2018-05-01 DIAGNOSIS — M150 Primary generalized (osteo)arthritis: Secondary | ICD-10-CM

## 2018-05-01 DIAGNOSIS — M542 Cervicalgia: Secondary | ICD-10-CM | POA: Diagnosis not present

## 2018-05-01 MED ORDER — BACLOFEN 10 MG PO TABS: 10.0000 mg | ORAL_TABLET | Freq: Every evening | ORAL | 0 refills | Status: DC | PRN

## 2018-05-01 MED ORDER — DICLOFENAC SODIUM 1 % TD GEL: 2.0000 g | Freq: Four times a day (QID) | TRANSDERMAL | 3 refills | Status: AC

## 2018-05-01 NOTE — Patient Instructions (Addendum)
Consider thermacare wraps to neck to help with muscle tightness.   Voltaren gel to neck 2-4 times daily to help with inflammation for up to 2 weeks. Let me know if symptoms are not improved in that time. Baclofen (muscle relaxer) at bedtime for next few nights to help relieve muscle tension/spasm; then just as needed.  Stretches 2-3 times daily  Cervical Strain and Sprain Rehab Ask your health care provider which exercises are safe for you. Do exercises exactly as told by your health care provider and adjust them as directed. It is normal to feel mild stretching, pulling, tightness, or discomfort as you do these exercises, but you should stop right away if you feel sudden pain or your pain gets worse.Do not begin these exercises until told by your health care provider. Stretching and range of motion exercises These exercises warm up your muscles and joints and improve the movement and flexibility of your neck. These exercises also help to relieve pain, numbness, and tingling. Exercise A: Cervical side bend  1. Using good posture, sit on a stable chair or stand up. 2. Without moving your shoulders, slowly tilt your left / right ear to your shoulder until you feel a stretch in your neck muscles. You should be looking straight ahead. 3. Hold for ______15____ seconds. 4. Repeat with the other side of your neck. Exercise B: Cervical rotation  1. Using good posture, sit on a stable chair or stand up. 2. Slowly turn your head to the side as if you are looking over your left / right shoulder. ? Keep your eyes level with the ground. ? Stop when you feel a stretch along the side and the back of your neck. 3. Hold for _____15_____ seconds. 4. Repeat this by turning to your other side. Exercise C: Thoracic extension and pectoral stretch 1. Roll a towel or a small blanket so it is about 4 inches (10 cm) in diameter. 2. Lie down on your back on a firm surface. 3. Put the towel lengthwise, under your  spine in the middle of your back. It should not be not under your shoulder blades. The towel should line up with your spine from your middle back to your lower back. 4. Put your hands behind your head and let your elbows fall out to your sides. 5. Hold for ___15_______ seconds. Posture and body mechanics  Body mechanics refers to the movements and positions of your body while you do your daily activities. Posture is part of body mechanics. Good posture and healthy body mechanics can help to relieve stress in your body's tissues and joints. Good posture means that your spine is in its natural S-curve position (your spine is neutral), your shoulders are pulled back slightly, and your head is not tipped forward. The following are general guidelines for applying improved posture and body mechanics to your everyday activities. Standing  When standing, keep your spine neutral and keep your feet about hip-width apart. Keep a slight bend in your knees. Your ears, shoulders, and hips should line up.  When you do a task in which you stand in one place for a long time, place one foot up on a stable object that is 2-4 inches (5-10 cm) high, such as a footstool. This helps keep your spine neutral. Sitting   When sitting, keep your spine neutral and your keep feet flat on the floor. Use a footrest, if necessary, and keep your thighs parallel to the floor. Avoid rounding your shoulders, and avoid  tilting your head forward.  When working at a desk or a computer, keep your desk at a height where your hands are slightly lower than your elbows. Slide your chair under your desk so you are close enough to maintain good posture.  When working at a computer, place your monitor at a height where you are looking straight ahead and you do not have to tilt your head forward or downward to look at the screen. Resting When lying down and resting, avoid positions that are most painful for you. Try to support your neck in a  neutral position. You can use a contour pillow or a small rolled-up towel. Your pillow should support your neck but not push on it. This information is not intended to replace advice given to you by your health care provider. Make sure you discuss any questions you have with your health care provider. Document Released: 09/16/2005 Document Revised: 05/23/2016 Document Reviewed: 08/23/2015 Elsevier Interactive Patient Education  [employer].

## 2018-05-01 NOTE — Progress Notes (Signed)
Jessica Casey DOB: September 19, 1951 Encounter date: 05/01/2018  This is a 67 y.o. female who presents with Chief Complaint  Patient presents with  . Neck Pain    "stiff neck", hurts to turn to the right, patient states "the last few days i feel like something is crawling up my head, like a bug crawling on me"  sensation is not constant, neck pain 5/10 pain scale,     History of present illness:  Has had neck pain for awhile; thought initially way she was sleeping, but not sure now.  Does use CPAP.  Last couple of days now feels that something is crawling up neck up right side. Pain wasn't bad enough to get checked out, but now with crawling sensation wanted to get things looked at. Pain is limited to neck. Feels like "stiff neck" but duration is worrying her.   Has tried tylenol which helps some.   Turning to right is what bothers her most. Turning does not make crawling sensation worse.    Hx of multiple neck fusions; has done well with each of these.  Allergies  Allergen Reactions  . Gabapentin     REACTION: TOUNGE SWELLING, ITCHING   Current Meds  Medication Sig  . albuterol (PROVENTIL HFA;VENTOLIN HFA) 108 (90 Base) MCG/ACT inhaler Inhale 2 puffs into the lungs every 6 (six) hours as needed for wheezing or shortness of breath.  Marland Kitchen alendronate (FOSAMAX) 70 MG tablet TAKE 1 TABLET EVERY SUNDAY. TAKE WITH A FULL GLASS OF  WATER ON AN EMPTY STOMACH  . aspirin 81 MG tablet Take 81 mg by mouth daily.    . Biotin 300 MCG TABS Take 300 mcg by mouth daily.    Marland Kitchen buPROPion (WELLBUTRIN XL) 300 MG 24 hr tablet Take 1 tablet (300 mg total) by mouth daily.  . Cholecalciferol (VITAMIN D) 2000 UNITS CAPS Take by mouth.  . conjugated estrogens (PREMARIN) vaginal cream Use pea size amount to urethra twice a week at bedtime.  . diclofenac sodium (VOLTAREN) 1 % GEL Apply 2 g topically 4 (four) times daily.  Marland Kitchen donepezil (ARICEPT) 10 MG tablet Take 1 tablet (10 mg total) by mouth at bedtime.  Marland Kitchen  escitalopram (LEXAPRO) 20 MG tablet Take 1 tablet (20 mg total) by mouth daily.  . fluticasone (FLONASE) 50 MCG/ACT nasal spray Place 1 spray into both nostrils 2 (two) times daily.  . Omega-3 Fatty Acids (FISH OIL PO) Take by mouth.  . sulfamethoxazole-trimethoprim (BACTRIM DS) 800-160 MG tablet Take 1 tablet by mouth 2 (two) times daily. One PO BID x 3 days  . [DISCONTINUED] diclofenac sodium (VOLTAREN) 1 % GEL Apply 2 g topically 4 (four) times daily.    Review of Systems  Constitutional: Negative for chills, fatigue and fever.  Musculoskeletal: Positive for neck stiffness. Negative for neck pain.  Skin: Negative for color change and rash.  Neurological: Negative for dizziness, weakness, light-headedness, numbness and headaches.    Objective:  BP 110/62 (BP Location: Left Arm, Patient Position: Sitting, Cuff Size: Normal)   Pulse 99   Temp 98.6 F (37 C) (Oral)   Wt 136 lb 9.6 oz (62 kg)   LMP 10/01/1995 (Approximate)   SpO2 97%   BMI 28.06 kg/m   Weight: 136 lb 9.6 oz (62 kg)   BP Readings from Last 3 Encounters:  05/01/18 110/62  03/12/18 (!) 108/58  02/11/18 125/77   Wt Readings from Last 3 Encounters:  05/01/18 136 lb 9.6 oz (62 kg)  03/12/18 133 lb (  60.3 kg)  02/11/18 135 lb (61.2 kg)    Physical Exam  Constitutional: She is oriented to person, place, and time.  Eyes: Pupils are equal, round, and reactive to light. Conjunctivae and lids are normal.  Musculoskeletal:  There is some spasm paracervical musculature on right. Palpation of this does reproduce her symptoms. Slightly decreased extension ability (no pain); full flexion. Pain with right head turn; limited to about 80 degrees. Full ROM of arms, shoulders. Full strength hands, arms, shoulders.  Neurological: She is alert and oriented to person, place, and time. She has normal strength. She displays normal reflexes.  Reflex Scores:      Tricep reflexes are 2+ on the right side and 2+ on the left side.       Bicep reflexes are 2+ on the right side and 2+ on the left side.      Brachioradialis reflexes are 2+ on the right side and 2+ on the left side. Skin:  Palp is globally dry which she states is normal for her.  No obvious rash or irritation of the scalp in the area where she is having the "crawling" sensation.    Assessment/Plan  There are no diagnoses linked to this encounter.  1. Neck pain on right side Neck stretches demonstrated in the office today.  Okay to use heat or ice for comfort.  We discussed using baclofen at bedtime to help with muscle relaxation.  This will be short-term only.  Let us know if any worsening of pain or if not improved in 2 weeks time. I suspect that nerve sensation is coming from muscle spasm/inflammation. If this does not resolve with above, will need further evaluation. - baclofen (LIORESAL) 10 MG tablet; Take 1 tablet (10 mg total) by mouth at bedtime as needed for muscle spasms.  Dispense: 20 each; Refill: 0  2. Primary osteoarthritis involving multiple joints Voltaren gel was refilled today.  She uses this for chronic arthritis.  I have also encouraged her to use this on the neck to help with muscle inflammation. - diclofenac sodium (VOLTAREN) 1 % GEL; Apply 2 g topically 4 (four) times daily.  Dispense: 3 Tube; Refill: 3       Micheline Rough, MD

## 2018-05-26 ENCOUNTER — Encounter: Payer: Self-pay | Admitting: Obstetrics and Gynecology

## 2018-07-23 ENCOUNTER — Ambulatory Visit: Payer: Medicare Other | Admitting: Obstetrics and Gynecology

## 2018-07-28 ENCOUNTER — Other Ambulatory Visit: Payer: Self-pay

## 2018-07-28 ENCOUNTER — Encounter: Payer: Self-pay | Admitting: Obstetrics and Gynecology

## 2018-07-28 ENCOUNTER — Ambulatory Visit: Payer: Medicare Other | Admitting: Obstetrics and Gynecology

## 2018-07-28 ENCOUNTER — Other Ambulatory Visit (HOSPITAL_COMMUNITY)
Admission: RE | Admit: 2018-07-28 | Discharge: 2018-07-28 | Disposition: A | Discharge status: Home / Self Care | Payer: Medicare Other | Source: Ambulatory Visit | Attending: Obstetrics and Gynecology | Admitting: Obstetrics and Gynecology

## 2018-07-28 VITALS — BP 120/60 | HR 88 | Resp 16 | Ht 58.25 in | Wt 138.0 lb

## 2018-07-28 DIAGNOSIS — Z01419 Encounter for gynecological examination (general) (routine) without abnormal findings: Secondary | ICD-10-CM | POA: Diagnosis not present

## 2018-07-28 MED ORDER — ALENDRONATE SODIUM 70 MG PO TABS: ORAL_TABLET | ORAL | 3 refills | Status: DC

## 2018-07-28 NOTE — Patient Instructions (Signed)

## 2018-07-28 NOTE — Progress Notes (Signed)
67 y.o. G43P2002 Married Caucasian female here for annual exam.    On Fosamax for about 7 years.  Hx wrist fracture.   Had neuropsychiatric evaluation showing severe anxiety.  Was referred to a psychiatrist in [place], [place] but this person is not accepting new patient.  Does not need refill of the Lexapro and Wellbutrin.   Stopped Aricept due to nightmares and hot flashes.   PCP: Betty [name], MD    Patient's last menstrual period was 10/01/1995 (approximate).           Sexually active: No.  The current method of family planning is post menopausal status.    Exercising: Yes.    some Smoker:  no  Health Maintenance: Pap:  07/05/16 Neg   05/14/13 Neg. HR HPV:neg  History of abnormal Pap:  no MMG:  05/04/18 BIRADS1:Neg  Colonoscopy:  07/17/17 Polyps. F/u 5 years  BMD:   08/12/17   Result: osteopenia TDaP:  2008.  She will do at her pharmacy.  Gardasil: n/a HIV: No Hep C: 02/11/18 Neg  Screening Labs: PCP   reports that she has never smoked. She has never used smokeless tobacco. She reports that she does not drink alcohol or use drugs.  Past Medical History:  Diagnosis Date  . Anxiety   . Cataract   . Colonic polyp 2005 & 2013  . DDD (degenerative disc disease)   . Depression   . GERD (gastroesophageal reflux disease)   . H/O echocardiogram    before 2000, no need for f/u /w cardiac   . Heart murmur    MVP- echo- 2010, no symptoms   . Memory disorder 09/19/2017  . Mononucleosis 1971  . Osteoporosis   . Pinched nerve in neck   . Sleep apnea     Past Surgical History:  Procedure Laterality Date  . ANTERIOR CERVICAL DECOMP/DISCECTOMY FUSION N/A 05/20/2013   Procedure: ANTERIOR CERVICAL DECOMPRESSION/DISCECTOMY FUSION 2 LEVELS;  Surgeon: Erline Levine, MD;  Location: [facility];  Service: Neurosurgery;  Laterality: N/A;  Cervical Seven-Thoracic One, Thoracic One-Two Anterior cervical decompression/Diskectomy/Fusion  . CARPAL TUNNEL RELEASE Right   . CATARACT EXTRACTION,  BILATERAL  2013   Dr Gershon Crane, Viona Gilmore IOL  . CERVICAL FUSION     2 proceduresr Vertell Limber  . colonoscopy with polypectomy  2013   Dr Deatra Ina  . FRACTURE SURGERY     L wrist - June 2014  . HERNIA REPAIR    . KNEE SURGERY     Dr Wonda Olds ; bursa cystectomy  . LUMBAR DISC SURGERY     Dr.Stern  . ROTATOR CUFF REPAIR     Dr.Wainer  . UMBILICAL HERNIA REPAIR    . WRIST FRACTURE SURGERY Left 02/2013   Dr. Kathryne Hitch    Current Outpatient Medications  Medication Sig Dispense Refill  . albuterol (PROVENTIL HFA;VENTOLIN HFA) 108 (90 Base) MCG/ACT inhaler Inhale 2 puffs into the lungs every 6 (six) hours as needed for wheezing or shortness of breath. 1 Inhaler 0  . alendronate (FOSAMAX) 70 MG tablet TAKE 1 TABLET EVERY SUNDAY. TAKE WITH A FULL GLASS OF  WATER ON AN EMPTY STOMACH 12 tablet 3  . aspirin 81 MG tablet Take 81 mg by mouth daily.      . baclofen (LIORESAL) 10 MG tablet Take 1 tablet (10 mg total) by mouth at bedtime as needed for muscle spasms. 20 each 0  . Biotin 300 MCG TABS Take 300 mcg by mouth daily.      Marland Kitchen buPROPion [facility]  XL) 300 MG 24 hr tablet Take 1 tablet (300 mg total) by mouth daily. 90 tablet 3  . Calcium Carb-Cholecalciferol (CALCIUM 1000 + D PO) Take by mouth daily.    . Cholecalciferol (VITAMIN D) 2000 UNITS CAPS Take by mouth.    . diclofenac sodium (VOLTAREN) 1 % GEL Apply 2 g topically 4 (four) times daily. 3 Tube 3  . escitalopram (LEXAPRO) 20 MG tablet Take 1 tablet (20 mg total) by mouth daily. 90 tablet 3  . fluticasone (FLONASE) 50 MCG/ACT nasal spray Place 1 spray into both nostrils 2 (two) times daily. 16 g 3  . Multiple Vitamin (MULTI-VITAMIN DAILY PO) Take by mouth daily.    . Omega-3 Fatty Acids (FISH OIL PO) Take by mouth.    . Vaginal Lubricant (REPLENS [facility]) Place vaginally every 3 (three) days.     No current facility-administered medications for this visit.     Family History  Problem Relation Age of Onset  . Diabetes Father   . Coronary artery  disease Father        S/P CBAG , no MI -- Dec  . Depression Father   . Diverticulosis Father   . Breast cancer Mother        estorgen receptor positive  . Stroke Mother 55       Dec  . Breast cancer Sister        estrogen receptor positive  . Depression Sister   . Depression Sister   . Depression Sister   . Depression Sister   . Depression Brother   . Bipolar disorder Other        Neice  . Colon polyps Sister   . Colon cancer Neg Hx   . Esophageal cancer Neg Hx   . Pancreatic cancer Neg Hx   . Rectal cancer Neg Hx   . Stomach cancer Neg Hx     Review of Systems  All other systems reviewed and are negative.   Exam:   BP 120/60 (BP Location: Left Arm, Patient Position: Sitting, Cuff Size: Large)   Pulse 88   Resp 16   Ht 4' 10.25" (1.48 m)   Wt 138 lb (62.6 kg)   LMP 10/01/1995 (Approximate)   BMI 28.59 kg/m     General appearance: alert, cooperative and appears stated age Head: Normocephalic, without obvious abnormality, atraumatic Neck: no adenopathy, supple, symmetrical, trachea midline and thyroid normal to inspection and palpation Lungs: clear to auscultation bilaterally Breasts: normal appearance, no masses or tenderness, No nipple retraction or dimpling, No nipple discharge or bleeding, No axillary or supraclavicular adenopathy Heart: regular rate and rhythm Abdomen: soft, non-tender; no masses, no organomegaly Extremities: extremities normal, atraumatic, no cyanosis or edema Skin: Skin color, texture, turgor normal. No rashes or lesions Lymph nodes: Cervical, supraclavicular, and axillary nodes normal. No abnormal inguinal nodes palpated Neurologic: Grossly normal  Pelvic: External genitalia:  no lesions              Urethra:  normal appearing urethra with no masses, tenderness or lesions              Bartholins and Skenes: normal                 Vagina: normal appearing vagina with normal color and discharge, no lesions              Cervix: no lesions               Pap taken: Yes.  Bimanual Exam:  Uterus:  normal size, contour, position, consistency, mobility, non-tender              Adnexa: no mass, fullness, tenderness              Rectal exam: Yes.  .  Confirms.              Anus:  normal sphincter tone, no lesions  Chaperone was present for exam.  Assessment:   Well woman visit with normal exam. Osteopenia.  On Fosamax for 7 years.  Hx wrist fracture.  Hx anxiety and depression.   Plan: Mammogram screening. Recommended self breast awareness. Pap and HR HPV as above. Guidelines for Calcium, Vitamin D, regular exercise program including cardiovascular and weight bearing exercise. BMD next year.  She is pursuing consultation with psychiatry.  She declines refills of Lexapro and Wellbutrin.  Follow up annually and prn.   After visit summary provided.

## 2018-07-30 LAB — CYTOLOGY - PAP: Diagnosis: NEGATIVE

## 2018-08-12 ENCOUNTER — Telehealth: Payer: Self-pay | Admitting: Psychology

## 2018-08-12 NOTE — Telephone Encounter (Signed)
Dr Bailar is leaving our office to take a job closer to where she lives. We have mailed a letter to patient to let her know we have canceled all appointments with Bailar. Thank you for your understanding °

## 2018-09-14 ENCOUNTER — Encounter: Payer: Self-pay | Admitting: Family Medicine

## 2018-09-14 ENCOUNTER — Telehealth: Payer: Self-pay | Admitting: Psychology

## 2018-09-14 NOTE — Telephone Encounter (Signed)
Left a message for patient regarding the letter that was mailed (Dr. Si Raider leaving). Explained to patient that we would be happy to refer to another neuropsychologist. Asked patient to call if ready to proceed with referral or if has any questions.

## 2018-09-16 ENCOUNTER — Encounter: Payer: Self-pay | Admitting: Family Medicine

## 2018-09-16 ENCOUNTER — Ambulatory Visit: Payer: Medicare Other | Admitting: Family Medicine

## 2018-09-16 VITALS — BP 124/80 | HR 96 | Temp 98.1°F | Resp 12 | Ht 58.25 in | Wt 136.2 lb

## 2018-09-16 DIAGNOSIS — R059 Cough, unspecified: Secondary | ICD-10-CM

## 2018-09-16 DIAGNOSIS — R05 Cough: Secondary | ICD-10-CM | POA: Diagnosis not present

## 2018-09-16 DIAGNOSIS — J069 Acute upper respiratory infection, unspecified: Secondary | ICD-10-CM | POA: Diagnosis not present

## 2018-09-16 MED ORDER — BENZONATATE 100 MG PO CAPS: 200.0000 mg | ORAL_CAPSULE | Freq: Two times a day (BID) | ORAL | 0 refills | Status: AC | PRN

## 2018-09-16 MED ORDER — HYDROCODONE-HOMATROPINE 5-1.5 MG/5ML PO SYRP: 5.0000 mL | ORAL_SOLUTION | Freq: Two times a day (BID) | ORAL | 0 refills | Status: AC | PRN

## 2018-09-16 NOTE — Patient Instructions (Addendum)
A few things to remember from today's visit:   Cough - Plan: benzonatate (TESSALON) 100 MG capsule, HYDROcodone-homatropine (HYCODAN) 5-1.5 MG/5ML syrup I do not think antibiotic is needed at this time. GERD can also aggravate or cause cough, recommend taking over-the-counter Zantac 150 mg at bedtime.  Monitor for fever, shortness of breath, or wheezing. Albuterol inh 2 puff every 6 hours for a week then as needed for wheezing or shortness of breath.    Please be sure medication list is accurate. If a new problem present, please set up appointment sooner than planned today.

## 2018-09-16 NOTE — Progress Notes (Signed)
ACUTE VISIT  HPI:  Chief Complaint  Patient presents with  . Cough    sx started Friday  . Chest Congestion  . Nasal Congestion    Jessica Casey is a 67 y.o.female here today complaining of 5 days of respiratory symptoms. Non productive cough,sometimes she has productive cough but not able to bring sputum up. Sore throat, nasal congestion, and post nasal drainage. SOB with coughing spells. Negative for chest pain or wheezing.  No fever,chills,or body aches.  Symptoms are worse at night. She has not identified alleviating factors.  URI   This is a new problem. The current episode started in the past 7 days. The problem has been unchanged. There has been no fever. Associated symptoms include congestion, coughing, rhinorrhea and a sore throat. Pertinent negatives include no abdominal pain, chest pain, diarrhea, ear pain, headaches, nausea, neck pain, plugged ear sensation, rash, sinus pain, swollen glands, vomiting or wheezing. She has tried decongestant for the symptoms. The treatment provided no relief.   No Hx of recent travel. Her husband was sick with similar symptoms. No known insect bite.  Hx of allergies: Seasonal. She uses Flonase as needed.   OTC medications for this problem: Coricidin and mucinex.  Review of Systems  Constitutional: Positive for fatigue. Negative for activity change, appetite change and fever.  HENT: Positive for congestion, postnasal drip, rhinorrhea, sore throat and voice change. Negative for ear pain, mouth sores, sinus pressure, sinus pain and trouble swallowing.   Eyes: Negative for discharge, redness and itching.  Respiratory: Positive for cough. Negative for wheezing.   Cardiovascular: Negative for chest pain.  Gastrointestinal: Negative for abdominal pain, diarrhea, nausea and vomiting.  Musculoskeletal: Negative for gait problem, myalgias and neck pain.  Skin: Negative for rash.  Allergic/Immunologic: Positive for  environmental allergies.  Neurological: Negative for syncope, weakness and headaches.  Hematological: Negative for adenopathy. Does not bruise/bleed easily.  Psychiatric/Behavioral: The patient is nervous/anxious.       Current Outpatient Medications on File Prior to Visit  Medication Sig Dispense Refill  . alendronate (FOSAMAX) 70 MG tablet TAKE 1 TABLET EVERY SUNDAY. TAKE WITH A FULL GLASS OF  WATER ON AN EMPTY STOMACH 12 tablet 3  . aspirin 81 MG tablet Take 81 mg by mouth daily.      . Biotin 300 MCG TABS Take 300 mcg by mouth daily.      Marland Kitchen buPROPion (WELLBUTRIN XL) 300 MG 24 hr tablet Take 1 tablet (300 mg total) by mouth daily. 90 tablet 3  . Calcium Carb-Cholecalciferol (CALCIUM 1000 + D PO) Take by mouth daily.    . Cholecalciferol (VITAMIN D) 2000 UNITS CAPS Take by mouth.    . diclofenac sodium (VOLTAREN) 1 % GEL Apply 2 g topically 4 (four) times daily. 3 Tube 3  . escitalopram (LEXAPRO) 20 MG tablet Take 1 tablet (20 mg total) by mouth daily. 90 tablet 3  . Multiple Vitamin (MULTI-VITAMIN DAILY PO) Take by mouth daily.    . Omega-3 Fatty Acids (FISH OIL PO) Take by mouth.    . Vaginal Lubricant (REPLENS VA) Place vaginally every 3 (three) days.    Marland Kitchen albuterol (PROVENTIL HFA;VENTOLIN HFA) 108 (90 Base) MCG/ACT inhaler Inhale 2 puffs into the lungs every 6 (six) hours as needed for wheezing or shortness of breath. (Patient not taking: Reported on 09/16/2018) 1 Inhaler 0  . baclofen (LIORESAL) 10 MG tablet Take 1 tablet (10 mg total) by mouth at bedtime as needed  for muscle spasms. (Patient not taking: Reported on 09/16/2018) 20 each 0  . fluticasone (FLONASE) 50 MCG/ACT nasal spray Place 1 spray into both nostrils 2 (two) times daily. (Patient not taking: Reported on 09/16/2018) 16 g 3   No current facility-administered medications on file prior to visit.      Past Medical History:  Diagnosis Date  . Anxiety   . Cataract   . Colonic polyp 2005 & 2013  . DDD (degenerative  disc disease)   . Depression   . GERD (gastroesophageal reflux disease)   . H/O echocardiogram    before 2000, no need for f/u /w cardiac   . Heart murmur    MVP- echo- 2010, no symptoms   . Memory disorder 09/19/2017  . Mononucleosis 1971  . Osteoporosis   . Pinched nerve in neck   . Sleep apnea    Allergies  Allergen Reactions  . Gabapentin     REACTION: TOUNGE SWELLING, ITCHING    Social History   Socioeconomic History  . Marital status: Married    Spouse name: Not on file  . Number of children: Not on file  . Years of education: 16+  . Highest education level: Not on file  Occupational History  . 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 Financial resource strain: Not on file  . Food insecurity:    Worry: Not on file    Inability: Not on file  . Transportation needs:    Medical: Not on file    Non-medical: Not on file  Tobacco Use  . Smoking status: Never Smoker  . Smokeless tobacco: Never Used  Substance and Sexual Activity  . Alcohol use: No    Alcohol/week: 0.0 standard drinks  . Drug use: No  . Sexual activity: Not Currently    Partners: Male    Birth control/protection: Post-menopausal  Lifestyle  . Physical activity:    Days per week: Not on file    Minutes per session: Not on file  . Stress: Not on file  Relationships  . Social connections:    Talks on phone: Not on file    Gets together: Not on file    Attends religious service: Not on file    Active member of club or organization: Not on file    Attends meetings of clubs or organizations: Not on file    Relationship status: Not on file  Other Topics Concern  . Not on file  Social History Narrative   Lives   Caffeine use:    Right handed     Vitals:   09/16/18 1024  BP: 124/80  Pulse: 96  Resp: 12  Temp: 98.1 F (36.7 C)  SpO2: 98%   Body mass index is 28.23 kg/m.   Physical Exam  Nursing note and vitals reviewed. Constitutional: She is oriented to person, place, and  time. She appears well-developed. She does not appear ill. No distress.  HENT:  Head: Normocephalic and atraumatic.  Right Ear: Tympanic membrane, external ear and ear canal normal.  Left Ear: Tympanic membrane, external ear and ear canal normal.  Nose: Rhinorrhea present. Right sinus exhibits no maxillary sinus tenderness and no frontal sinus tenderness. Left sinus exhibits no maxillary sinus tenderness and no frontal sinus tenderness.  Mouth/Throat: Oropharynx is clear and moist and mucous membranes are normal.  Mild dysphonia.  Eyes: Conjunctivae are normal.  Neck: No muscular tenderness present. No edema and no erythema present.  Cardiovascular: Normal rate and regular rhythm.  No murmur heard. Respiratory: Effort normal and breath sounds normal. No stridor. No respiratory distress.  Coughing spells a few times during visit. Prolonged expiration.  Lymphadenopathy:       Head (right side): No submandibular adenopathy present.       Head (left side): No submandibular adenopathy present.    She has no cervical adenopathy.  Neurological: She is alert and oriented to person, place, and time. She has normal strength.  Skin: Skin is warm. No rash noted. No erythema.  Psychiatric: She has a normal mood and affect.  Well groomed, good eye contact.      ASSESSMENT AND PLAN:  Ms. Jessica Casey was seen today for cough, chest congestion and nasal congestion.  Diagnoses and all orders for this visit:  URI, acute Symptoms suggests a viral etiology,so symptoms treatment recommended. Instructed to monitor for signs of complications, including new onset of fever among some, clearly instructed about warning signs. I also explained that cough and nasal congestion can last a few days and sometimes weeks. F/U as needed.   Cough Lung auscultation after albuterol treatment still clear, it did help some with coughing spells. I do not think imaging is needed today.  She has Albuterol inh at home,  recommended Albuterol inh 2 puff every 6 hours for a week then as needed for wheezing or shortness of breath.  Side effects of Hydrocodone discussed.  -     benzonatate (TESSALON) 100 MG capsule; Take 2 capsules (200 mg total) by mouth 2 (two) times daily as needed for up to 10 days. -     HYDROcodone-homatropine (HYCODAN) 5-1.5 MG/5ML syrup; Take 5 mLs by mouth every 12 (twelve) hours as needed for up to 10 days for cough.      Return if symptoms worsen or fail to improve.    Jessica Vitelli G. [name], MD  [facility]. [place] office.

## 2018-10-20 ENCOUNTER — Ambulatory Visit: Payer: Medicare Other | Admitting: Adult Health

## 2018-10-20 ENCOUNTER — Encounter

## 2018-10-26 ENCOUNTER — Other Ambulatory Visit: Payer: Self-pay | Admitting: Obstetrics and Gynecology

## 2018-10-26 NOTE — Telephone Encounter (Signed)
Medication refill request: Bupropion and Escitalopram Last AEX:  07/28/18 BS Next AEX: 07/30/19 Last MMG (if hormonal medication request): 05/04/18 BIRADS1:Neg  Refill authorized: 07/11/17 #90 w/3 refills; Order pended for both prescriptions #90 w/3 refills if authorized.

## 2018-12-23 ENCOUNTER — Ambulatory Visit: Payer: Medicare Other | Admitting: Obstetrics and Gynecology

## 2018-12-23 ENCOUNTER — Other Ambulatory Visit: Payer: Self-pay

## 2018-12-23 ENCOUNTER — Telehealth: Payer: Self-pay | Admitting: Obstetrics and Gynecology

## 2018-12-23 ENCOUNTER — Encounter: Payer: Self-pay | Admitting: Obstetrics and Gynecology

## 2018-12-23 VITALS — BP 136/80 | HR 80 | Temp 98.1°F | Resp 16 | Ht 58.25 in | Wt 140.0 lb

## 2018-12-23 DIAGNOSIS — R3915 Urgency of urination: Secondary | ICD-10-CM

## 2018-12-23 DIAGNOSIS — N3090 Cystitis, unspecified without hematuria: Secondary | ICD-10-CM

## 2018-12-23 LAB — POCT URINALYSIS DIPSTICK
Bilirubin, UA: NEGATIVE
GLUCOSE UA: NEGATIVE
Ketones, UA: NEGATIVE
Nitrite, UA: NEGATIVE
Protein, UA: NEGATIVE
Urobilinogen, UA: 0.2 E.U./dL
pH, UA: 7 (ref 5.0–8.0)

## 2018-12-23 MED ORDER — PHENAZOPYRIDINE HCL 200 MG PO TABS: 200.0000 mg | ORAL_TABLET | Freq: Three times a day (TID) | ORAL | 0 refills | Status: DC | PRN

## 2018-12-23 MED ORDER — SULFAMETHOXAZOLE-TRIMETHOPRIM 800-160 MG PO TABS: 1.0000 | ORAL_TABLET | Freq: Two times a day (BID) | ORAL | 0 refills | Status: DC

## 2018-12-23 NOTE — Telephone Encounter (Signed)
Spoke with patient. Patient states that for 3 days she has been experiencing pain with urination, frequency, and urgency. Denies fever, chills, or back pain.  Reports symptoms are worsening. Appointment scheduled for today at 3:30 pm with Dr.Jertson. Patient is agreeable to date and time. Encounter closed.

## 2018-12-23 NOTE — Patient Instructions (Signed)

## 2018-12-23 NOTE — Telephone Encounter (Signed)
Patient is calling regarding possibly having a UTI. Patient stated that she is currently experiencing urgency and pain.

## 2018-12-23 NOTE — Progress Notes (Signed)
GYNECOLOGY  VISIT   HPI: 68 y.o.   Married White or Caucasian Not Hispanic or Latino  female   [phone] with Patient's last menstrual period was 10/01/1995 (approximate).   here for uti. She c/o a 2 day h/o urinary urgency, voiding small amounts, dysuria.  In the past she saw a Urologist for recurrent UTI's and was on antibiotics for 3 months straight.  Not sexually active. No fever, no change in back pain.  No vaginal discharge or c/o.  GYNECOLOGIC HISTORY: Patient's last menstrual period was 10/01/1995 (approximate). Contraception: postmenopausal Menopausal hormone therapy: none        OB History    Gravida  2   Para  2   Term  2   Preterm      AB      Living  2     SAB      TAB      Ectopic      Multiple      Live Births                 Patient Active Problem List   Diagnosis Date Noted  . Memory disorder 09/19/2017  . OSA (obstructive sleep apnea) 02/04/2017  . Osteoarthritis, multiple sites 08/06/2016  . Cough 02/28/2016  . Pain of left lower leg 08/08/2015  . Anemia 02/17/2013  . Hypoalbuminemia 02/17/2013  . Unspecified vitamin D deficiency 02/17/2013  . Depression, major, in remission ([place]) 08/02/2009  . Osteopenia 08/02/2009  . Fatigue 08/02/2009  . COLONIC POLYPS, HX OF 08/02/2009  . DEGENERATIVE DISC DISEASE 10/27/2008  . Brachial neuritis or radiculitis 10/27/2008    Past Medical History:  Diagnosis Date  . Anxiety   . Cataract   . Colonic polyp 2005 & 2013  . DDD (degenerative disc disease)   . Depression   . GERD (gastroesophageal reflux disease)   . H/O echocardiogram    before 2000, no need for f/u /w cardiac   . Heart murmur    MVP- echo- 2010, no symptoms   . Memory disorder 09/19/2017  . Mononucleosis 1971  . Osteoporosis   . Pinched nerve in neck   . Sleep apnea     Past Surgical History:  Procedure Laterality Date  . ANTERIOR CERVICAL DECOMP/DISCECTOMY FUSION N/A 05/20/2013   Procedure: ANTERIOR CERVICAL  DECOMPRESSION/DISCECTOMY FUSION 2 LEVELS;  Surgeon: Erline Levine, MD;  Location: [facility];  Service: Neurosurgery;  Laterality: N/A;  Cervical Seven-Thoracic One, Thoracic One-Two Anterior cervical decompression/Diskectomy/Fusion  . CARPAL TUNNEL RELEASE Right   . CATARACT EXTRACTION, BILATERAL  2013   Dr Gershon Crane, Viona Gilmore IOL  . CERVICAL FUSION     2 proceduresr Vertell Limber  . colonoscopy with polypectomy  2013   Dr Deatra Ina  . FRACTURE SURGERY     L wrist - June 2014  . HERNIA REPAIR    . KNEE SURGERY     Dr Wonda Olds ; bursa cystectomy  . LUMBAR DISC SURGERY     Dr.Stern  . ROTATOR CUFF REPAIR     Dr.Wainer  . UMBILICAL HERNIA REPAIR    . WRIST FRACTURE SURGERY Left 02/2013   Dr. Kathryne Hitch    Current Outpatient Medications  Medication Sig Dispense Refill  . alendronate (FOSAMAX) 70 MG tablet TAKE 1 TABLET EVERY SUNDAY. TAKE WITH A FULL GLASS OF  WATER ON AN EMPTY STOMACH 12 tablet 3  . aspirin 81 MG tablet Take 81 mg by mouth daily.      . Biotin 300 MCG TABS  Take 300 mcg by mouth daily.      Marland Kitchen buPROPion (WELLBUTRIN XL) 300 MG 24 hr tablet TAKE 1 TABLET BY MOUTH  DAILY 90 tablet 2  . Calcium Carb-Cholecalciferol (CALCIUM 1000 + D PO) Take by mouth daily.    . Cholecalciferol (VITAMIN D) 2000 UNITS CAPS Take by mouth.    . diclofenac sodium (VOLTAREN) 1 % GEL Apply 2 g topically 4 (four) times daily. 3 Tube 3  . escitalopram (LEXAPRO) 20 MG tablet TAKE 1 TABLET BY MOUTH  DAILY 90 tablet 2  . Multiple Vitamin (MULTI-VITAMIN DAILY PO) Take by mouth daily.    . Omega-3 Fatty Acids (FISH OIL PO) Take by mouth.    . Vaginal Lubricant (REPLENS VA) Place vaginally every 3 (three) days.     No current facility-administered medications for this visit.      ALLERGIES: Gabapentin  Family History  Problem Relation Age of Onset  . Diabetes Father   . Coronary artery disease Father        S/P CBAG , no MI -- Dec  . Depression Father   . Diverticulosis Father   . Breast cancer Mother         estorgen receptor positive  . Stroke Mother 51       Dec  . Breast cancer Sister        estrogen receptor positive  . Depression Sister   . Depression Sister   . Depression Sister   . Depression Sister   . Depression Brother   . Bipolar disorder Other        Neice  . Colon polyps Sister   . Colon cancer Neg Hx   . Esophageal cancer Neg Hx   . Pancreatic cancer Neg Hx   . Rectal cancer Neg Hx   . Stomach cancer Neg Hx     Social History   Socioeconomic History  . Marital status: Married    Spouse name: Not on file  . Number of children: Not on file  . Years of education: 16+  . Highest education level: Not on file  Occupational History  . 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 Financial resource strain: Not on file  . Food insecurity:    Worry: Not on file    Inability: Not on file  . Transportation needs:    Medical: Not on file    Non-medical: Not on file  Tobacco Use  . Smoking status: Never Smoker  . Smokeless tobacco: Never Used  Substance and Sexual Activity  . Alcohol use: No    Alcohol/week: 0.0 standard drinks  . Drug use: No  . Sexual activity: Not Currently    Partners: Male    Birth control/protection: Post-menopausal  Lifestyle  . Physical activity:    Days per week: Not on file    Minutes per session: Not on file  . Stress: Not on file  Relationships  . Social connections:    Talks on phone: Not on file    Gets together: Not on file    Attends religious service: Not on file    Active member of club or organization: Not on file    Attends meetings of clubs or organizations: Not on file    Relationship status: Not on file  . Intimate partner violence:    Fear of current or ex partner: Not on file    Emotionally abused: Not on file    Physically abused: Not on file    Forced  sexual activity: Not on file  Other Topics Concern  . Not on file  Social History Narrative   Lives   Caffeine use:    Right handed     Review of Systems   Constitutional: Negative.   HENT: Negative.   Eyes: Negative.   Respiratory: Negative.   Cardiovascular: Negative.   Gastrointestinal: Negative.   Genitourinary: Positive for frequency and urgency.  Musculoskeletal: Negative.   Skin: Negative.   Neurological: Negative.   Endo/Heme/Allergies: Negative.   Psychiatric/Behavioral: Negative.   All other systems reviewed and are negative.   PHYSICAL EXAMINATION:    BP 136/80   Pulse 80   Temp 98.1 F (36.7 C) (Oral)   Resp 16   Ht 4' 10.25" (1.48 m)   Wt 140 lb (63.5 kg)   LMP 10/01/1995 (Approximate)   BMI 29.01 kg/m     General appearance: alert, cooperative and appears stated age CVA: not tender Abdomen: soft, non-tender; non distended, no masses,  no organomegaly   ASSESSMENT UTI    PLAN Send urine for ua, c&s Treat with Bactrim DS and pyridium   An After Visit Summary was printed and given to the patient.

## 2018-12-24 LAB — URINALYSIS, MICROSCOPIC ONLY
Casts: NONE SEEN /lpf
WBC, UA: >30 /hpf — AB (ref 0–5)

## 2018-12-26 LAB — URINE CULTURE

## 2019-01-07 ENCOUNTER — Encounter: Payer: Self-pay | Admitting: Obstetrics and Gynecology

## 2019-01-07 ENCOUNTER — Other Ambulatory Visit: Payer: Self-pay

## 2019-01-07 ENCOUNTER — Telehealth: Payer: Self-pay | Admitting: Obstetrics and Gynecology

## 2019-01-07 ENCOUNTER — Ambulatory Visit: Payer: Medicare Other | Admitting: Obstetrics and Gynecology

## 2019-01-07 VITALS — BP 116/66 | HR 88 | Temp 98.5°F | Ht 58.25 in | Wt 137.4 lb

## 2019-01-07 DIAGNOSIS — N3001 Acute cystitis with hematuria: Secondary | ICD-10-CM | POA: Diagnosis not present

## 2019-01-07 LAB — POCT URINALYSIS DIPSTICK
Bilirubin, UA: NEGATIVE
Glucose, UA: NEGATIVE
Ketones, UA: NEGATIVE
Nitrite, UA: POSITIVE
Protein, UA: POSITIVE — AB
Urobilinogen, UA: 0.2 E.U./dL
pH, UA: 6 (ref 5.0–8.0)

## 2019-01-07 MED ORDER — PHENAZOPYRIDINE HCL 200 MG PO TABS: 200.0000 mg | ORAL_TABLET | Freq: Three times a day (TID) | ORAL | 0 refills | Status: DC | PRN

## 2019-01-07 MED ORDER — AMOXICILLIN-POT CLAVULANATE 500-125 MG PO TABS: 1.0000 | ORAL_TABLET | Freq: Three times a day (TID) | ORAL | 0 refills | Status: DC

## 2019-01-07 NOTE — Progress Notes (Signed)
GYNECOLOGY  VISIT   HPI: 68 y.o.   Married White or Caucasian Not Hispanic or Latino  female   [phone] with Patient's last menstrual period was 10/01/1995 (approximate).   here for dysuria since yesterday    She was treated on 12/23/18 for a UTI with Bactrim (sensitive). Symptoms resolved with the antibiotic. C/O dysuria since yesterday, with frequency and urgency, urine is cloudy.   GYNECOLOGIC HISTORY: Patient's last menstrual period was 10/01/1995 (approximate). Contraception: PMP Menopausal hormone therapy: none        OB History    Gravida  2   Para  2   Term  2   Preterm      AB      Living  2     SAB      TAB      Ectopic      Multiple      Live Births                 Patient Active Problem List   Diagnosis Date Noted  . Memory disorder 09/19/2017  . OSA (obstructive sleep apnea) 02/04/2017  . Osteoarthritis, multiple sites 08/06/2016  . Cough 02/28/2016  . Pain of left lower leg 08/08/2015  . Anemia 02/17/2013  . Hypoalbuminemia 02/17/2013  . Unspecified vitamin D deficiency 02/17/2013  . Depression, major, in remission ([place]) 08/02/2009  . Osteopenia 08/02/2009  . Fatigue 08/02/2009  . COLONIC POLYPS, HX OF 08/02/2009  . DEGENERATIVE DISC DISEASE 10/27/2008  . Brachial neuritis or radiculitis 10/27/2008    Past Medical History:  Diagnosis Date  . Anxiety   . Cataract   . Colonic polyp 2005 & 2013  . DDD (degenerative disc disease)   . Depression   . GERD (gastroesophageal reflux disease)   . H/O echocardiogram    before 2000, no need for f/u /w cardiac   . Heart murmur    MVP- echo- 2010, no symptoms   . Memory disorder 09/19/2017  . Mononucleosis 1971  . Osteoporosis   . Pinched nerve in neck   . Sleep apnea     Past Surgical History:  Procedure Laterality Date  . ANTERIOR CERVICAL DECOMP/DISCECTOMY FUSION N/A 05/20/2013   Procedure: ANTERIOR CERVICAL DECOMPRESSION/DISCECTOMY FUSION 2 LEVELS;  Surgeon: Erline Levine, MD;   Location: [facility];  Service: Neurosurgery;  Laterality: N/A;  Cervical Seven-Thoracic One, Thoracic One-Two Anterior cervical decompression/Diskectomy/Fusion  . CARPAL TUNNEL RELEASE Right   . CATARACT EXTRACTION, BILATERAL  2013   Dr Gershon Crane, Viona Gilmore IOL  . CERVICAL FUSION     2 proceduresr Vertell Limber  . colonoscopy with polypectomy  2013   Dr Deatra Ina  . FRACTURE SURGERY     L wrist - June 2014  . HERNIA REPAIR    . KNEE SURGERY     Dr Wonda Olds ; bursa cystectomy  . LUMBAR DISC SURGERY     Dr.Stern  . ROTATOR CUFF REPAIR     Dr.Wainer  . UMBILICAL HERNIA REPAIR    . WRIST FRACTURE SURGERY Left 02/2013   Dr. Kathryne Hitch    Current Outpatient Medications  Medication Sig Dispense Refill  . alendronate (FOSAMAX) 70 MG tablet TAKE 1 TABLET EVERY SUNDAY. TAKE WITH A FULL GLASS OF  WATER ON AN EMPTY STOMACH 12 tablet 3  . aspirin 81 MG tablet Take 81 mg by mouth daily.      . Biotin 300 MCG TABS Take 300 mcg by mouth daily.      Marland Kitchen buPROPion (WELLBUTRIN XL)  300 MG 24 hr tablet TAKE 1 TABLET BY MOUTH  DAILY 90 tablet 2  . Calcium Carb-Cholecalciferol (CALCIUM 1000 + D PO) Take by mouth daily.    . Cholecalciferol (VITAMIN D) 2000 UNITS CAPS Take by mouth.    . diclofenac sodium (VOLTAREN) 1 % GEL Apply 2 g topically 4 (four) times daily. 3 Tube 3  . escitalopram (LEXAPRO) 20 MG tablet TAKE 1 TABLET BY MOUTH  DAILY 90 tablet 2  . Multiple Vitamin (MULTI-VITAMIN DAILY PO) Take by mouth daily.    . Omega-3 Fatty Acids (FISH OIL PO) Take by mouth.    . Vaginal Lubricant (REPLENS VA) Place vaginally every 3 (three) days.     No current facility-administered medications for this visit.      ALLERGIES: Gabapentin  Family History  Problem Relation Age of Onset  . Diabetes Father   . Coronary artery disease Father        S/P CBAG , no MI -- Dec  . Depression Father   . Diverticulosis Father   . Breast cancer Mother        estorgen receptor positive  . Stroke Mother 45       Dec  . Breast  cancer Sister        estrogen receptor positive  . Depression Sister   . Depression Sister   . Depression Sister   . Depression Sister   . Depression Brother   . Bipolar disorder Other        Neice  . Colon polyps Sister   . Colon cancer Neg Hx   . Esophageal cancer Neg Hx   . Pancreatic cancer Neg Hx   . Rectal cancer Neg Hx   . Stomach cancer Neg Hx     Social History   Socioeconomic History  . Marital status: Married    Spouse name: Not on file  . Number of children: Not on file  . Years of education: 16+  . Highest education level: Not on file  Occupational History  . 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 Financial resource strain: Not on file  . Food insecurity:    Worry: Not on file    Inability: Not on file  . Transportation needs:    Medical: Not on file    Non-medical: Not on file  Tobacco Use  . Smoking status: Never Smoker  . Smokeless tobacco: Never Used  Substance and Sexual Activity  . Alcohol use: No    Alcohol/week: 0.0 standard drinks  . Drug use: No  . Sexual activity: Not Currently    Partners: Male    Birth control/protection: Post-menopausal  Lifestyle  . Physical activity:    Days per week: Not on file    Minutes per session: Not on file  . Stress: Not on file  Relationships  . Social connections:    Talks on phone: Not on file    Gets together: Not on file    Attends religious service: Not on file    Active member of club or organization: Not on file    Attends meetings of clubs or organizations: Not on file    Relationship status: Not on file  . Intimate partner violence:    Fear of current or ex partner: Not on file    Emotionally abused: Not on file    Physically abused: Not on file    Forced sexual activity: Not on file  Other Topics Concern  . Not on file  Social History Narrative   Lives   Caffeine use:    Right handed     Review of Systems  Constitutional: Negative.   HENT: Negative.   Eyes: Negative.    Respiratory: Negative.   Cardiovascular: Negative.   Gastrointestinal: Negative.   Genitourinary: Positive for dysuria, frequency and urgency.       Loss of urine spontaneously  Night urination   Musculoskeletal: Negative.   Skin: Negative.   Neurological: Negative.   Endo/Heme/Allergies: Negative.   Psychiatric/Behavioral: Negative.   All other systems reviewed and are negative.   PHYSICAL EXAMINATION:    BP 116/66   Pulse 88   Temp 98.5 F (36.9 C) (Oral)   Ht 4' 10.25" (1.48 m)   Wt 137 lb 6.4 oz (62.3 kg)   LMP 10/01/1995 (Approximate)   BMI 28.47 kg/m     General appearance: alert, cooperative and appears stated age Abdomen: soft, non-tender; non distended, no masses,  no organomegaly CVA: not tender  Urine dip: moderate blood, 2+leuk, positive nitrate  ASSESSMENT Recurrent vs partially treated UTI    PLAN Treat with Augmentin x 7 days Pyridium Send urine for ua, c&s Call with worsening symptoms   An After Visit Summary was printed and given to the patient.

## 2019-01-07 NOTE — Telephone Encounter (Signed)
Call to patient. Reports return of UTI symptoms after completing medication from visit on 12-23-18. Reports urinary urgency, hesitancy, cloudy urine with odor and "awful feeling."  Denies fever or back pain. No recent exposures to illness.  Office visit today at [facility] to Dr Talbert Nan, Juluis Rainier. Encounter closed.

## 2019-01-07 NOTE — Patient Instructions (Signed)

## 2019-01-07 NOTE — Telephone Encounter (Signed)
Patient is still experiencing symptoms after being treated for a UTI .

## 2019-01-08 LAB — URINALYSIS, MICROSCOPIC ONLY
Casts: NONE SEEN /lpf
RBC: >30 /hpf — AB (ref 0–2)
WBC, UA: >30 /hpf — AB (ref 0–5)

## 2019-01-10 LAB — URINE CULTURE

## 2019-01-11 ENCOUNTER — Telehealth: Payer: Self-pay

## 2019-01-11 MED ORDER — NITROFURANTOIN MONOHYD MACRO 100 MG PO CAPS: 100.0000 mg | ORAL_CAPSULE | Freq: Two times a day (BID) | ORAL | 0 refills | Status: DC

## 2019-01-11 NOTE — Telephone Encounter (Signed)
Spoke with patient. Results given. Patient verbalizes understanding. Patient denies any worsening symptoms, fever, chills, or back pain. Rx for Macrobid 100 mg po BID x 7 days #14 0RF sent to pharmacy on file. Patient verbalizes understanding.

## 2019-01-11 NOTE — Telephone Encounter (Signed)
-----   Message from Salvadore Dom, MD sent at 01/11/2019  8:44 AM EDT ----- Please inform the patient that she has a different bacteria causing her infection this time and needs a different antibiotic (the bacteria is only mildly sensitive to the antibiotic she is on). Please call in macrobid 100mg  BID for 7 days.  Please make sure she is not having fever or flank pain.

## 2019-01-22 ENCOUNTER — Ambulatory Visit: Payer: Self-pay | Admitting: *Deleted

## 2019-01-22 NOTE — Telephone Encounter (Signed)
Spoke with patient and offered virtual visit. Patient declined at this time, she stated that if she needed a visit by Monday then she would call to schedule. Patient given directions per Dr. [name] to go to urgent care if bleeding gets worse.

## 2019-01-22 NOTE — Telephone Encounter (Signed)
Message sent to Dr. Jordan for review and approval. 

## 2019-01-22 NOTE — Telephone Encounter (Signed)
Patient is calling with concerns that she is having rectal bleeding for the first time. Patient states she has only had this happen once early this morning and she has not had a BM since. Patient advised of virtual visit and she will be scheduled to discuss symptoms with her provider.  Reason for Disposition . MILD rectal bleeding (more than just a few drops or streaks)  Answer Assessment - Initial Assessment Questions 1. APPEARANCE of BLOOD: "What color is it?" "Is it passed separately, on the surface of the stool, or mixed in with the stool?"      On surface of stool, bright red 2. AMOUNT: "How much blood was passed?"      Not a lot- tiny bit 3. FREQUENCY: "How many times has blood been passed with the stools?"      once 4. ONSET: "When was the blood first seen in the stools?" (Days or weeks)      First time 5. DIARRHEA: "Is there also some diarrhea?" If so, ask: "How many diarrhea stools were passed in past 24 hours?"      no 6. CONSTIPATION: "Do you have constipation?" If so, "How bad is it?"     Constipation last week- gone 4-5 days 7. RECURRENT SYMPTOMS: "Have you had blood in your stools before?" If so, ask: "When was the last time?" and "What happened that time?"      no 8. BLOOD THINNERS: "Do you take any blood thinners?" (e.g., Coumadin/warfarin, Pradaxa/dabigatran, aspirin)     ASA-81 mg 9. OTHER SYMPTOMS: "Do you have any other symptoms?"  (e.g., abdominal pain, vomiting, dizziness, fever)     No- patient had unsteadiness this morning that was short lived today 10. PREGNANCY: "Is there any chance you are pregnant?" "When was your last menstrual period?"       n/a  Protocols used: RECTAL BLEEDING-A-AH

## 2019-02-09 ENCOUNTER — Encounter: Payer: Self-pay | Admitting: Family Medicine

## 2019-02-09 ENCOUNTER — Other Ambulatory Visit: Payer: Self-pay

## 2019-02-09 ENCOUNTER — Ambulatory Visit: Payer: Medicare Other

## 2019-02-09 ENCOUNTER — Ambulatory Visit (INDEPENDENT_AMBULATORY_CARE_PROVIDER_SITE_OTHER): Payer: Medicare Other | Admitting: Family Medicine

## 2019-02-09 DIAGNOSIS — F325 Major depressive disorder, single episode, in full remission: Secondary | ICD-10-CM

## 2019-02-09 DIAGNOSIS — E559 Vitamin D deficiency, unspecified: Secondary | ICD-10-CM | POA: Diagnosis not present

## 2019-02-09 DIAGNOSIS — M85859 Other specified disorders of bone density and structure, unspecified thigh: Secondary | ICD-10-CM

## 2019-02-09 DIAGNOSIS — Z0000 Encounter for general adult medical examination without abnormal findings: Secondary | ICD-10-CM

## 2019-02-09 MED ORDER — ESCITALOPRAM OXALATE 20 MG PO TABS: 10.0000 mg | ORAL_TABLET | Freq: Every day | ORAL | 0 refills | Status: DC

## 2019-02-09 NOTE — Progress Notes (Signed)
Virtual Visit via Video Note   I connected with Jessica Casey on 02/09/19 at 10:30 AM EDT by a video enabled telemedicine application and verified that I am speaking with the correct person using two identifiers.  Location patient: home Location provider:home office Persons participating in the virtual visit: patient, provider  I discussed the limitations of evaluation and management by telemedicine and the availability of in person appointments. She expressed understanding and agreed to proceed.   HPI: Jessica Casey is a 67 yo female with Hx of anxiety,depression,vit D deficiency,OA,and osteoporosis among some.  She lives with her husband. Independent ADL's and IADL's. No falls in the past year and denies depression symptoms.  Functional Status Survey: Is the patient deaf or have difficulty hearing?: Yes(Hearing aids) Does the patient have difficulty seeing, even when wearing glasses/contacts?: No Does the patient have difficulty concentrating, remembering, or making decisions?: No Does the patient have difficulty walking or climbing stairs?: No Does the patient have difficulty dressing or bathing?: No Does the patient have difficulty doing errands alone such as visiting a doctor's office or shopping?: No  Fall Risk  02/09/2019 02/04/2018  Falls in the past year? 0 No  Number falls in past yr: 0 -  Injury with Fall? 0 -  Risk for fall due to : Orthopedic patient -  Follow up Education provided -    Providers she sees regularly:  Gyn,Dr Amundson,next appt 07/2019 Eye care provider: Dr. Gershon Crane. She is trying to establish with a new eye care provider.  She no longer following with psychiatrist, her gynecologist has been failing her prescription for Lexapro 20 mg and Wellbutrin XL 300 mg daily. She wonders if she needs to change to a different medication, denies depressed mood or suicidal thoughts. She does not feel depressed  But feels like she does not care,flat. Some anxiety due to  current COVID-19 pandemia.   Depression screen [facility] 2/9 02/09/2019  Decreased Interest 0  Down, Depressed, Hopeless 0  PHQ - 2 Score 0   Mini-Cog - 02/09/19 1230    Normal clock drawing test?  yes    How many words correct?  3       Vision Screening Comments: Virtual visit.   Negative for history of hypertension, hyperlipidemia, DM, CVA, or CAD. Currently she is on Aspirin 81 mg because she has family history of CVD.  Osteoporosis, currently she is on Fosamax 70 mg weekly, she has been taking it for 5 years. According to patient, her gynecologist decided to continue medication. Last DEXA 07/2017, osteopenia. She is currently on calcium supplementation. Vitamin D deficiency, she is on vitamin D 2000 units daily.  She is exercising regularly, walking 2 miles 3 times per week. She has not been very consistent with a healthful diet. Last mammogram on 05/04/2018, BI-RADS 1. Last colonoscopy 07/17/2017, five-year follow-up was recommended because polypectomy.   ROS: See pertinent positives and negatives per HPI.  Past Medical History:  Diagnosis Date  . Anxiety   . Cataract   . Colonic polyp 2005 & 2013  . DDD (degenerative disc disease)   . Depression   . GERD (gastroesophageal reflux disease)   . H/O echocardiogram    before 2000, no need for f/u /w cardiac   . Heart murmur    MVP- echo- 2010, no symptoms   . Memory disorder 09/19/2017  . Mononucleosis 1971  . Osteoporosis   . Pinched nerve in neck   . Sleep apnea     Past Surgical History:  Procedure Laterality Date  . ANTERIOR CERVICAL DECOMP/DISCECTOMY FUSION N/A 05/20/2013   Procedure: ANTERIOR CERVICAL DECOMPRESSION/DISCECTOMY FUSION 2 LEVELS;  Surgeon: Erline Levine, Jessica Casey;  Location: [facility];  Service: Neurosurgery;  Laterality: N/A;  Cervical Seven-Thoracic One, Thoracic One-Two Anterior cervical decompression/Diskectomy/Fusion  . CARPAL TUNNEL RELEASE Right   . CATARACT EXTRACTION, BILATERAL  2013   Dr  Gershon Crane, Viona Gilmore IOL  . CERVICAL FUSION     2 proceduresr Vertell Limber  . colonoscopy with polypectomy  2013   Dr Deatra Ina  . FRACTURE SURGERY     L wrist - June 2014  . HERNIA REPAIR    . KNEE SURGERY     Dr Wonda Olds ; bursa cystectomy  . LUMBAR DISC SURGERY     Dr.Stern  . ROTATOR CUFF REPAIR     Dr.Wainer  . UMBILICAL HERNIA REPAIR    . WRIST FRACTURE SURGERY Left 02/2013   Dr. Kathryne Hitch    Family History  Problem Relation Age of Onset  . Diabetes Father   . Coronary artery disease Father        S/P CBAG , no MI -- Dec  . Depression Father   . Diverticulosis Father   . Breast cancer Mother        estorgen receptor positive  . Stroke Mother 41       Dec  . Breast cancer Sister        estrogen receptor positive  . Depression Sister   . Depression Sister   . Depression Sister   . Depression Sister   . Depression Brother   . Bipolar disorder Other        Neice  . Colon polyps Sister   . Colon cancer Neg Hx   . Esophageal cancer Neg Hx   . Pancreatic cancer Neg Hx   . Rectal cancer Neg Hx   . Stomach cancer Neg Hx     Social History   Socioeconomic History  . Marital status: Married    Spouse name: Not on file  . Number of children: Not on file  . Years of education: 16+  . Highest education level: Not on file  Occupational History  . 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 Financial resource strain: Not on file  . Food insecurity:    Worry: Not on file    Inability: Not on file  . Transportation needs:    Medical: Not on file    Non-medical: Not on file  Tobacco Use  . Smoking status: Never Smoker  . Smokeless tobacco: Never Used  Substance and Sexual Activity  . Alcohol use: No    Alcohol/week: 0.0 standard drinks  . Drug use: No  . Sexual activity: Not Currently    Partners: Male    Birth control/protection: Post-menopausal  Lifestyle  . Physical activity:    Days per week: Not on file    Minutes per session: Not on file  . Stress: Not on file   Relationships  . Social connections:    Talks on phone: Not on file    Gets together: Not on file    Attends religious service: Not on file    Active member of club or organization: Not on file    Attends meetings of clubs or organizations: Not on file    Relationship status: Not on file  . Intimate partner violence:    Fear of current or ex partner: Not on file    Emotionally abused: Not on file  Physically abused: Not on file    Forced sexual activity: Not on file  Other Topics Concern  . Not on file  Social History Narrative   Lives   Caffeine use:    Right handed      Current Outpatient Medications:  .  alendronate (FOSAMAX) 70 MG tablet, TAKE 1 TABLET EVERY SUNDAY. TAKE WITH A FULL GLASS OF  WATER ON AN EMPTY STOMACH, Disp: 12 tablet, Rfl: 3 .  Biotin 300 MCG TABS, Take 300 mcg by mouth daily.  , Disp: , Rfl:  .  buPROPion (WELLBUTRIN XL) 300 MG 24 hr tablet, TAKE 1 TABLET BY MOUTH  DAILY, Disp: 90 tablet, Rfl: 2 .  Calcium Carb-Cholecalciferol (CALCIUM 1000 + D PO), Take by mouth daily., Disp: , Rfl:  .  Cholecalciferol (VITAMIN D) 2000 UNITS CAPS, Take by mouth., Disp: , Rfl:  .  diclofenac sodium (VOLTAREN) 1 % GEL, Apply 2 g topically 4 (four) times daily., Disp: 3 Tube, Rfl: 3 .  escitalopram (LEXAPRO) 20 MG tablet, Take 0.5 tablets (10 mg total) by mouth daily., Disp: 90 tablet, Rfl: 0 .  Multiple Vitamin (MULTI-VITAMIN DAILY PO), Take by mouth daily., Disp: , Rfl:  .  Omega-3 Fatty Acids (FISH OIL PO), Take by mouth., Disp: , Rfl:  .  Vaginal Lubricant (REPLENS VA), Place vaginally every 3 (three) days., Disp: , Rfl:   EXAM:  VITALS per patient if applicable:N/A  GENERAL: alert, oriented, appears well and in no acute distress  HEENT: atraumatic, conjunctiva clear, no obvious facial abnormalities on inspection.  NECK: normal movements of the head and neck  LUNGS: on inspection no signs of respiratory distress, breathing rate appears normal, no obvious gross  SOB, gasping or wheezing  CV: no obvious cyanosis  Jessica: moves all visible extremities without noticeable abnormality. No signs of synovitis.  PSYCH/NEURO: pleasant and cooperative, no obvious depression or anxiety, speech and thought processing grossly intact  ASSESSMENT AND PLAN:  Discussed the following assessment and plan: Orders Placed This Encounter  Procedures  . Basic metabolic panel  . VITAMIN D 25 Hydroxy (Vit-D Deficiency, Fractures)    Medicare annual wellness visit, subsequent We discussed the importance of staying active, physically and mentally, as well as the benefits of a healthy/balnace diet. Low impact exercise that involve stretching and strengthing are ideal. Vaccines: Due for Pneumovax and Tdap,she is planning on going to her pharmacy for vaccination.  We discussed preventive screening for the next 5-10 years, summery of recommendations discussed during visit. Colonoscopy is due in 2022. Mammogram due in 06/2019. DEXA is due in 08/20/2019 or 2021. Eye exam and glaucoma screening annually. Continue vitamin D and calcium supplementation. Fall prevention.  We discussed current recommendations in regard to Aspirin for primary prevention as well as side effects.  Advance directives and end of life discussed, she has POA and living will.   Depression, major, in remission ([place]) She is not having depression like symptoms but most like flat mood. She agrees with trying to decrease dose of Lexapro from 20 mg to 10 mg daily. No changes in Wellbutrin XL 300 mg daily. Instructed about warning signs. F/U in 2-3 months,before if needed.  Osteopenia Last DEXA 07/2017 osteopenia. She already completed 5 years of Fosamax. Continue following with gyn. Fall precaution and no changes in Ca++ and vit D supplementation.  Vitamin D deficiency, unspecified Continue Vt D 2000 U daily. Further recommendations will be given according to 25 [place] vit D results.     I  discussed  the assessment and treatment plan with the patient. She was provided an opportunity to ask questions and all were answered. The patient agreed with the plan and demonstrated an understanding of the instructions.     Return in about 3 months (around 05/12/2019) for depression.    Jessica Hogan [name], Jessica Casey

## 2019-02-09 NOTE — Assessment & Plan Note (Signed)
Last DEXA 07/2017 osteopenia. She already completed 5 years of Fosamax. Continue following with gyn. Fall precaution and no changes in Ca++ and vit D supplementation.

## 2019-02-09 NOTE — Assessment & Plan Note (Signed)
Continue Vt D 2000 U daily. Further recommendations will be given according to 25 [place] vit D results.

## 2019-02-09 NOTE — Assessment & Plan Note (Signed)
She is not having depression like symptoms but most like flat mood. She agrees with trying to decrease dose of Lexapro from 20 mg to 10 mg daily. No changes in Wellbutrin XL 300 mg daily. Instructed about warning signs. F/U in 2-3 months,before if needed.

## 2019-02-10 ENCOUNTER — Other Ambulatory Visit: Payer: Self-pay

## 2019-02-10 ENCOUNTER — Other Ambulatory Visit: Payer: Medicare Other

## 2019-02-10 ENCOUNTER — Other Ambulatory Visit (INDEPENDENT_AMBULATORY_CARE_PROVIDER_SITE_OTHER): Payer: Medicare Other

## 2019-02-10 DIAGNOSIS — E559 Vitamin D deficiency, unspecified: Secondary | ICD-10-CM

## 2019-02-11 ENCOUNTER — Encounter: Payer: Self-pay | Admitting: Family Medicine

## 2019-02-11 LAB — BASIC METABOLIC PANEL
BUN: 16 mg/dL (ref 6–23)
CO2: 29 mEq/L (ref 19–32)
Calcium: 9.3 mg/dL (ref 8.4–10.5)
Chloride: 104 mEq/L (ref 96–112)
Creatinine, Ser: 1.14 mg/dL (ref 0.40–1.20)
GFR: 47.27 mL/min — ABNORMAL LOW (ref >60.00)
Glucose, Bld: 90 mg/dL (ref 70–99)
Potassium: 4.3 mEq/L (ref 3.5–5.1)
Sodium: 140 mEq/L (ref 135–145)

## 2019-02-11 LAB — VITAMIN D 25 HYDROXY (VIT D DEFICIENCY, FRACTURES): VITD: 79.96 ng/mL (ref 30.00–100.00)

## 2019-02-15 ENCOUNTER — Encounter: Payer: Self-pay | Admitting: Family Medicine

## 2019-02-16 ENCOUNTER — Encounter: Payer: Self-pay | Admitting: Family Medicine

## 2019-03-11 ENCOUNTER — Encounter: Payer: Self-pay | Admitting: Psychology

## 2019-03-23 ENCOUNTER — Encounter: Payer: Self-pay | Admitting: Psychology

## 2019-04-20 ENCOUNTER — Other Ambulatory Visit: Payer: Self-pay | Admitting: Obstetrics and Gynecology

## 2019-05-06 ENCOUNTER — Other Ambulatory Visit: Payer: Self-pay | Admitting: Obstetrics and Gynecology

## 2019-05-17 ENCOUNTER — Encounter: Payer: Self-pay | Admitting: Obstetrics and Gynecology

## 2019-06-08 ENCOUNTER — Other Ambulatory Visit: Payer: Self-pay | Admitting: Obstetrics and Gynecology

## 2019-06-09 NOTE — Telephone Encounter (Signed)
Spoke with patient. Patient states she wants Dr. Quincy Simmonds to manage lexapro and Wellbutrin. Patient is currently taking Lexapro 10 mg daily, 1/2 of 20 mg tab. Patient request Rx for Lexapro 10 mg tab. Confirmed Wellbutrin XL 300 mg po daily. Advised I will forward to Dr. Quincy Simmonds to review, f/u with pharmacy for filling.   Lexapro Rx changed to one 10 mg tab PO daily #90/0RF.   Rx pended.   Routing to Dr. Quincy Simmonds

## 2019-06-09 NOTE — Telephone Encounter (Signed)
Please contact patient to determine who will be prescribing her Lexapro and her Wellbutrin.  Her PCP prescribed Lexapro for her earlier this year and lowered the dosage to 10 mg daily.

## 2019-06-09 NOTE — Telephone Encounter (Signed)
Medication refill request: Lexapro Last AEX:  07/28/2018 BS Next AEX: 08/13/2019 Last MMG (if hormonal medication request): 05/04/2018 BIRADS 1 Negative Density C Refill authorized: Pending authorization. #90 with no refills if appropriate. Please advise.   Medication refill request: Wellbutrin Last AEX:  07/28/2018 BS Next AEX: 08/13/2019 Last MMG (if hormonal medication request): BIRADS 1 Negative Density C Refill authorized: Pending authorization. #90 with no refills if appropriate. Please advise.

## 2019-06-11 ENCOUNTER — Encounter: Payer: Self-pay | Admitting: Family Medicine

## 2019-06-28 ENCOUNTER — Other Ambulatory Visit: Payer: Self-pay

## 2019-06-28 ENCOUNTER — Other Ambulatory Visit: Payer: Self-pay | Admitting: Podiatry

## 2019-06-28 ENCOUNTER — Ambulatory Visit (INDEPENDENT_AMBULATORY_CARE_PROVIDER_SITE_OTHER): Payer: Medicare Other

## 2019-06-28 ENCOUNTER — Ambulatory Visit (INDEPENDENT_AMBULATORY_CARE_PROVIDER_SITE_OTHER): Payer: Medicare Other | Admitting: Podiatry

## 2019-06-28 ENCOUNTER — Telehealth: Payer: Self-pay | Admitting: *Deleted

## 2019-06-28 DIAGNOSIS — M21619 Bunion of unspecified foot: Secondary | ICD-10-CM

## 2019-06-28 DIAGNOSIS — M2041 Other hammer toe(s) (acquired), right foot: Secondary | ICD-10-CM | POA: Diagnosis not present

## 2019-06-28 NOTE — Patient Instructions (Signed)
Pre-Operative Instructions  Congratulations, you have decided to take an important step towards improving your quality of life.  You can be assured that the doctors and staff at [facility] will be with you every step of the way.  Here are some important things you should know:  1. Plan to be at the surgery center/hospital at least 1 (one) hour prior to your scheduled time, unless otherwise directed by the surgical center/hospital staff.  You must have a responsible adult accompany you, remain during the surgery and drive you home.  Make sure you have directions to the surgical center/hospital to ensure you arrive on time. 2. If you are having surgery at [facility] or [facility], you will need a copy of your medical history and physical form from your family physician within one month prior to the date of surgery. We will give you a form for your primary physician to complete.  3. We make every effort to accommodate the date you request for surgery.  However, there are times where surgery dates or times have to be moved.  We will contact you as soon as possible if a change in schedule is required.   4. No aspirin/ibuprofen for one week before surgery.  If you are on aspirin, any non-steroidal anti-inflammatory medications (Mobic, Aleve, Ibuprofen) should not be taken seven (7) days prior to your surgery.  You make take Tylenol for pain prior to surgery.  5. Medications - If you are taking daily heart and blood pressure medications, seizure, reflux, allergy, asthma, anxiety, pain or diabetes medications, make sure you notify the surgery center/hospital before the day of surgery so they can tell you which medications you should take or avoid the day of surgery. 6. No food or drink after midnight the night before surgery unless directed otherwise by surgical center/hospital staff. 7. No alcoholic beverages 24-hours prior to surgery.  No smoking 24-hours prior or 24-hours after  surgery. 8. Wear loose pants or shorts. They should be loose enough to fit over bandages, boots, and casts. 9. Don't wear slip-on shoes. Sneakers are preferred. 10. Bring your boot with you to the surgery center/hospital.  Also bring crutches or a walker if your physician has prescribed it for you.  If you do not have this equipment, it will be provided for you after surgery. 11. If you have not been contacted by the surgery center/hospital by the day before your surgery, call to confirm the date and time of your surgery. 12. Leave-time from work may vary depending on the type of surgery you have.  Appropriate arrangements should be made prior to surgery with your employer. 13. Prescriptions will be provided immediately following surgery by your doctor.  Fill these as soon as possible after surgery and take the medication as directed. Pain medications will not be refilled on weekends and must be approved by the doctor. 14. Remove nail polish on the operative foot and avoid getting pedicures prior to surgery. 15. Wash the night before surgery.  The night before surgery wash the foot and leg well with water and the antibacterial soap provided. Be sure to pay special attention to beneath the toenails and in between the toes.  Wash for at least three (3) minutes. Rinse thoroughly with water and dry well with a towel.  Perform this wash unless told not to do so by your physician.  Enclosed: 1 Ice pack (please put in freezer the night before surgery)   1 Hibiclens skin cleaner     Pre-op instructions  If you have any questions regarding the instructions, please do not hesitate to call our office.  [place]: [address], [place], [place] [address] -- [phone]  [place]: [address] [place], [place] [address] -- [phone]  [place]: [address]  [place], [place] [address] -- [phone]   Website: [email or web address] 

## 2019-06-28 NOTE — Progress Notes (Signed)
   Subjective: 68 y.o. female presenting today as a new patient with a chief complaint of an intermittent painful bunion of the right foot that has been present for the past few years. He states the bunion is causing the toes to change direction. Wearing shoes increases the pain. He has not had any treatment for the complaint but has been taking OTC pain medication and wearing supportive shoes with no significant relief. Patient is here for further evaluation and treatment.   Past Medical History:  Diagnosis Date  . Anxiety   . Cataract   . Colonic polyp 2005 & 2013  . DDD (degenerative disc disease)   . Depression   . GERD (gastroesophageal reflux disease)   . H/O echocardiogram    before 2000, no need for f/u /w cardiac   . Heart murmur    MVP- echo- 2010, no symptoms   . Memory disorder 09/19/2017  . Mononucleosis 1971  . Osteoporosis   . Pinched nerve in neck   . Sleep apnea      Objective: Physical Exam General: The patient is alert and oriented x3 in no acute distress.  Dermatology: Skin is cool, dry and supple bilateral lower extremities. Negative for open lesions or macerations.  Vascular: Palpable pedal pulses bilaterally. No edema or erythema noted. Capillary refill within normal limits.  Neurological: Epicritic and protective threshold grossly intact bilaterally.   Musculoskeletal Exam: Clinical evidence of bunion deformity noted to the respective foot. There is moderate pain on palpation range of motion of the first MPJ. Lateral deviation of the hallux noted consistent with hallux abductovalgus. Hammertoe contracture also noted on clinical exam to the 2nd digit of the right foot. Symptomatic pain on palpation and range of motion also noted to the metatarsal phalangeal joints of the respective hammertoe digits.    Radiographic Exam: Increased intermetatarsal angle greater than 15 with a hallux abductus angle greater than 30 noted on AP view. Moderate degenerative  changes noted within the first MPJ. Contracture deformity also noted to the interphalangeal joints and MPJs of the digits of the respective hammertoes.    Assessment: 1. HAV w/ bunion deformity right 2. Hammertoe deformity 2nd digit right     Plan of Care:  1. Patient was evaluated. X-Rays reviewed. 2. Today we discussed the conservative versus surgical management of the presenting pathology. The patient opts for surgical management. All possible complications and details of the procedure were explained. All patient questions were answered. No guarantees were expressed or implied. 3. Authorization for surgery was initiated today. Surgery will consist of bunionectomy with osteotomy right; PIPJ arthroplasty with MPJ capsulitis 2nd digit right.  4. CAM boot dispensed.  5. Return to clinic one week post op.    Helps care for her special needs 47 year old grandson.    Edrick Kins, DPM [facility]  Dr. Edrick Kins, [place]                                        [place], [place] [address]                Office [phone]  Fax [phone]

## 2019-06-28 NOTE — Telephone Encounter (Signed)
I was there today and scheduled surgery with Dr. Amalia Hailey.  I'd like to change that date to the following week to August 19, 2019.  I went home and looked at my calendar and realized I already have something scheduled for November 12."  I'll reschedule it to 08/19/2019.  "Do you have a time for me?"  I cannot give you a time.  Someone from the surgical center will give you a call a day or two prior to your surgery date.  They do not give times sooner because they may have cancellations and they like to do children and diabetic patients first.  "Okay, I understand."

## 2019-07-12 ENCOUNTER — Telehealth: Payer: Self-pay | Admitting: *Deleted

## 2019-07-12 NOTE — Telephone Encounter (Signed)
"  I'm scheduled for surgery on November 19 with Dr. Amalia Hailey.  I need to reschedule that.  What's his next available date?"  His next available date is September 16, 2019.  "Reschedule me to that date please.  I hate to keep changing the date."  I will get it rescheduled from 08/19/2019 to 09/16/2019.  I called and asked Caren Griffins at [facility] to reschedule Ms. Vonbehren's surgery from 08/19/2019 to 09/16/2019.

## 2019-07-30 ENCOUNTER — Other Ambulatory Visit: Payer: Self-pay | Admitting: Obstetrics and Gynecology

## 2019-07-30 ENCOUNTER — Ambulatory Visit: Payer: Medicare Other | Admitting: Obstetrics and Gynecology

## 2019-08-02 NOTE — Telephone Encounter (Signed)
Medication refill request: Lexapro Last AEX:  07/29/19 Next AEX: 10/13/19  Last MMG (if hormonal medication request): NA Refill authorized: #[id number] with 1 RF

## 2019-08-13 ENCOUNTER — Telehealth: Payer: Self-pay | Admitting: Obstetrics and Gynecology

## 2019-08-13 ENCOUNTER — Ambulatory Visit: Payer: Medicare Other | Admitting: Obstetrics and Gynecology

## 2019-08-13 NOTE — Telephone Encounter (Signed)
Patient was around grandson who was exposed to covid. Message sent to triage to call and assess patient.

## 2019-08-13 NOTE — Telephone Encounter (Signed)
Spoke with pt. Pt states being exposed to grandson that was exposed to Covid at school. They were around each other 15 mins without masks while pt took him to school in car. Pt states grandson is being tested today 08/13/19 and will call back to let us know what his results are next week. Pt rescheduled AEX from 08/17/19 to 08/23/19. Pt agreeable.  Routing to provider for final review. Patient is agreeable to disposition. Will close encounter.

## 2019-08-17 ENCOUNTER — Other Ambulatory Visit: Payer: Self-pay

## 2019-08-17 ENCOUNTER — Ambulatory Visit: Payer: Medicare Other | Admitting: Obstetrics and Gynecology

## 2019-08-17 NOTE — Progress Notes (Signed)
GYNECOLOGY  VISIT   HPI: 68 y.o.   Married  Caucasian  female   G2P2002 with Patient's last menstrual period was 10/01/1995 (approximate).   here for   UTI symptoms that started last Friday. Complaining of dysuria and urgency.   No back pain, fever, or chills.  UTI 12/23/18 - Klebsiella.  Tx with Bactrim DS. UTI 01/07/19 - E Coli and Group B strep.  Tx with Macrobid.  She took a 3 month course of abx in the past through urology.  She has taken Nitrofurantoin. She had a work up with negative cystoscopy and ultrasound.   Has vaginal estrogen cream.  Her sister and mother had estrogen receptor positive breast cancer.  Asking about her refill of Fosamax.  She is due for her BMD.  Urine dip - 3+ leukocytes, moderate blood, positive protein.  PCP:  Betty [name]  GYNECOLOGIC HISTORY: Patient's last menstrual period was 10/01/1995 (approximate). Contraception:  PMP Menopausal hormone therapy:  none Last mammogram: 05-17-19 3D/Neg/density B/BiRads1 Last pap smear: 07-28-18 Neg, 07/05/16 Neg,05-14-13 Neg:Neg HR HPV        OB History    Gravida  2   Para  2   Term  2   Preterm      AB      Living  2     SAB      TAB      Ectopic      Multiple      Live Births                 Patient Active Problem List   Diagnosis Date Noted  . Memory disorder 09/19/2017  . OSA (obstructive sleep apnea) 02/04/2017  . Osteoarthritis, multiple sites 08/06/2016  . Cough 02/28/2016  . Pain of left lower leg 08/08/2015  . Anemia 02/17/2013  . Hypoalbuminemia 02/17/2013  . Vitamin D deficiency, unspecified 02/17/2013  . Depression, major, in remission ([place]) 08/02/2009  . Osteopenia 08/02/2009  . Fatigue 08/02/2009  . COLONIC POLYPS, HX OF 08/02/2009  . DEGENERATIVE DISC DISEASE 10/27/2008  . Brachial neuritis or radiculitis 10/27/2008    Past Medical History:  Diagnosis Date  . Anxiety   . Cataract   . Colonic polyp 2005 & 2013  . DDD (degenerative disc disease)   .  Depression   . GERD (gastroesophageal reflux disease)   . H/O echocardiogram    before 2000, no need for f/u /w cardiac   . Heart murmur    MVP- echo- 2010, no symptoms   . Memory disorder 09/19/2017  . Mononucleosis 1971  . Osteoporosis   . Pinched nerve in neck   . Sleep apnea     Past Surgical History:  Procedure Laterality Date  . ANTERIOR CERVICAL DECOMP/DISCECTOMY FUSION N/A 05/20/2013   Procedure: ANTERIOR CERVICAL DECOMPRESSION/DISCECTOMY FUSION 2 LEVELS;  Surgeon: Erline Levine, MD;  Location: [facility];  Service: Neurosurgery;  Laterality: N/A;  Cervical Seven-Thoracic One, Thoracic One-Two Anterior cervical decompression/Diskectomy/Fusion  . CARPAL TUNNEL RELEASE Right   . CATARACT EXTRACTION, BILATERAL  2013   Dr Gershon Crane, Viona Gilmore IOL  . CERVICAL FUSION     2 proceduresr Vertell Limber  . colonoscopy with polypectomy  2013   Dr Deatra Ina  . FRACTURE SURGERY     L wrist - June 2014  . HERNIA REPAIR    . KNEE SURGERY     Dr Wonda Olds ; bursa cystectomy  . LUMBAR DISC SURGERY     Dr.Stern  . ROTATOR CUFF REPAIR  Dr.Wainer  . UMBILICAL HERNIA REPAIR    . WRIST FRACTURE SURGERY Left 02/2013   Dr. Kathryne Hitch    Current Outpatient Medications  Medication Sig Dispense Refill  . Acetaminophen (TYLENOL PO) Take by mouth.    Marland Kitchen alendronate (FOSAMAX) 70 MG tablet TAKE 1 TABLET EVERY SUNDAY. TAKE WITH A FULL GLASS OF  WATER ON AN EMPTY STOMACH 12 tablet 3  . Biotin 300 MCG TABS Take 300 mcg by mouth daily.      Marland Kitchen buPROPion (WELLBUTRIN XL) 300 MG 24 hr tablet TAKE 1 TABLET BY MOUTH  DAILY 90 tablet 0  . Calcium Carb-Cholecalciferol (CALCIUM 1000 + D PO) Take by mouth daily.    . Cholecalciferol (VITAMIN D) 2000 UNITS CAPS Take by mouth.    . diclofenac sodium (VOLTAREN) 1 % GEL Apply 2 g topically 4 (four) times daily. 3 Tube 3  . escitalopram (LEXAPRO) 10 MG tablet TAKE 1 TABLET BY MOUTH  DAILY 90 tablet 0  . Multiple Vitamin (MULTI-VITAMIN DAILY PO) Take by mouth daily.    . Omega-3  Fatty Acids (FISH OIL PO) Take by mouth.    . Vaginal Lubricant (REPLENS VA) Place vaginally every 3 (three) days.     No current facility-administered medications for this visit.      ALLERGIES: Gabapentin  Family History  Problem Relation Age of Onset  . Diabetes Father   . Coronary artery disease Father        S/P CBAG , no MI -- Dec  . Depression Father   . Diverticulosis Father   . Breast cancer Mother        estorgen receptor positive  . Stroke Mother 70       Dec  . Breast cancer Sister        estrogen receptor positive  . Depression Sister   . Depression Sister   . Depression Sister   . Depression Sister   . Depression Brother   . Bipolar disorder Other        Neice  . Colon polyps Sister   . Colon cancer Neg Hx   . Esophageal cancer Neg Hx   . Pancreatic cancer Neg Hx   . Rectal cancer Neg Hx   . Stomach cancer Neg Hx     Social History   Socioeconomic History  . Marital status: Married    Spouse name: Not on file  . Number of children: Not on file  . Years of education: 16+  . Highest education level: Not on file  Occupational History  . 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 Financial resource strain: Not on file  . Food insecurity    Worry: Not on file    Inability: Not on file  . Transportation needs    Medical: Not on file    Non-medical: Not on file  Tobacco Use  . Smoking status: Never Smoker  . Smokeless tobacco: Never Used  Substance and Sexual Activity  . Alcohol use: No    Alcohol/week: 0.0 standard drinks  . Drug use: No  . Sexual activity: Not Currently    Partners: Male    Birth control/protection: Post-menopausal  Lifestyle  . Physical activity    Days per week: Not on file    Minutes per session: Not on file  . Stress: Not on file  Relationships  . Social Herbalist on phone: Not on file    Gets together: Not on file    Attends  religious service: Not on file    Active member of club or organization:  Not on file    Attends meetings of clubs or organizations: Not on file    Relationship status: Not on file  . Intimate partner violence    Fear of current or ex partner: Not on file    Emotionally abused: Not on file    Physically abused: Not on file    Forced sexual activity: Not on file  Other Topics Concern  . Not on file  Social History Narrative   Lives   Caffeine use:    Right handed     Review of Systems  Constitutional: Negative.   HENT: Negative.   Eyes: Negative.   Respiratory: Negative.   Cardiovascular: Negative.   Gastrointestinal: Negative.   Endocrine: Negative.   Genitourinary: Positive for dysuria and urgency.  Musculoskeletal: Negative.   Skin: Negative.   Allergic/Immunologic: Negative.   Neurological: Negative.   Hematological: Negative.   Psychiatric/Behavioral: Negative.     PHYSICAL EXAMINATION:    BP 120/60 (BP Location: Right Arm, Patient Position: Sitting, Cuff Size: Normal)   Pulse 78   Temp 98.6 F (37 C) (Skin)   Resp 14   Ht 4\' 11"  (1.499 m)   Wt 127 lb 4 oz (57.7 kg)   LMP 10/01/1995 (Approximate)   BMI 25.70 kg/m     General appearance: alert, cooperative and appears stated age   Pelvic: External genitalia:  no lesions              Urethra:  normal appearing urethra with no masses, tenderness or lesions              Bartholins and Skenes: normal                 Vagina: normal appearing vagina with normal color and discharge, no lesions              Cervix: no lesions                Bimanual Exam:  Uterus:  normal size, contour, position, consistency, mobility, non-tender              Adnexa: no mass, fullness, tenderness          Chaperone was present for exam.  ASSESSMENT  Recurrent UTI. Osteopenia.   PLAN  Bactrim DS po bid x 3 days.  Pyridium 200 mg po tid prn.  Will start UTI prophylaxis after tx of this infection.  Call for worsening of symptoms.  Refill of Fosamax weekly for 3 months.  She will get her BMD  done at [facility] and then we will decide about continuation or not of this medication.  FU next week for annual exam.   An After Visit Summary was printed and given to the patient.  _15_____ minutes face to face time of which over 50% was spent in counseling.

## 2019-08-18 ENCOUNTER — Encounter: Payer: Self-pay | Admitting: Obstetrics and Gynecology

## 2019-08-18 ENCOUNTER — Ambulatory Visit: Payer: Medicare Other | Admitting: Obstetrics and Gynecology

## 2019-08-18 VITALS — BP 120/60 | HR 78 | Temp 98.6°F | Resp 14 | Ht 59.0 in | Wt 127.2 lb

## 2019-08-18 DIAGNOSIS — M8580 Other specified disorders of bone density and structure, unspecified site: Secondary | ICD-10-CM | POA: Diagnosis not present

## 2019-08-18 DIAGNOSIS — N3090 Cystitis, unspecified without hematuria: Secondary | ICD-10-CM

## 2019-08-18 DIAGNOSIS — N390 Urinary tract infection, site not specified: Secondary | ICD-10-CM

## 2019-08-18 DIAGNOSIS — R3915 Urgency of urination: Secondary | ICD-10-CM

## 2019-08-18 LAB — POCT URINALYSIS DIPSTICK
Bilirubin, UA: NEGATIVE
Glucose, UA: NEGATIVE
Ketones, UA: NEGATIVE
Nitrite, UA: POSITIVE
Protein, UA: POSITIVE — AB
Urobilinogen, UA: 0.2 E.U./dL
pH, UA: 5 (ref 5.0–8.0)

## 2019-08-18 MED ORDER — SULFAMETHOXAZOLE-TRIMETHOPRIM 800-160 MG PO TABS: 1.0000 | ORAL_TABLET | Freq: Two times a day (BID) | ORAL | 0 refills | Status: DC

## 2019-08-18 MED ORDER — PHENAZOPYRIDINE HCL 200 MG PO TABS: 200.0000 mg | ORAL_TABLET | Freq: Three times a day (TID) | ORAL | 0 refills | Status: DC | PRN

## 2019-08-18 MED ORDER — ALENDRONATE SODIUM 70 MG PO TABS: ORAL_TABLET | ORAL | 0 refills | Status: DC

## 2019-08-20 LAB — URINE CULTURE

## 2019-08-22 ENCOUNTER — Encounter: Payer: Self-pay | Admitting: Obstetrics and Gynecology

## 2019-08-23 ENCOUNTER — Encounter: Payer: Self-pay | Admitting: Internal Medicine

## 2019-08-23 ENCOUNTER — Ambulatory Visit (INDEPENDENT_AMBULATORY_CARE_PROVIDER_SITE_OTHER): Payer: Medicare Other | Admitting: Obstetrics and Gynecology

## 2019-08-23 ENCOUNTER — Other Ambulatory Visit: Payer: Self-pay

## 2019-08-23 ENCOUNTER — Telehealth: Payer: Self-pay | Admitting: Obstetrics and Gynecology

## 2019-08-23 ENCOUNTER — Encounter: Payer: Self-pay | Admitting: Obstetrics and Gynecology

## 2019-08-23 VITALS — BP 132/70 | HR 76 | Temp 97.4°F | Resp 12 | Ht <= 58 in | Wt 126.6 lb

## 2019-08-23 DIAGNOSIS — Z01419 Encounter for gynecological examination (general) (routine) without abnormal findings: Secondary | ICD-10-CM | POA: Diagnosis not present

## 2019-08-23 MED ORDER — ESCITALOPRAM OXALATE 10 MG PO TABS: 10.0000 mg | ORAL_TABLET | Freq: Every day | ORAL | 3 refills | Status: DC

## 2019-08-23 MED ORDER — NITROFURANTOIN MACROCRYSTAL 50 MG PO CAPS: ORAL_CAPSULE | ORAL | 0 refills | Status: DC

## 2019-08-23 MED ORDER — BUPROPION HCL ER (XL) 300 MG PO TB24: 300.0000 mg | ORAL_TABLET | Freq: Every day | ORAL | 3 refills | Status: DC

## 2019-08-23 NOTE — Patient Instructions (Signed)

## 2019-08-23 NOTE — Telephone Encounter (Signed)
Patient currently in office for aex appointment. Estill Bamberg, CMA updated patient sent MyChart message and will have patient discuss with Dr. Quincy Simmonds at appointment.   Routing to provider and will close encounter.

## 2019-08-23 NOTE — Progress Notes (Signed)
69 y.o. G34P2002 Married Caucasian female here for annual exam.    Has her bone density today.  Has been off the Fosamax for one or more months.   Needs refill of the Lexapro 10 mg and the Wellbutrin XL 300 mg.  Does best on Lexapro at 10 mg.  If she takes a higher dosage, she feels disconnected.   Just treated last week for E Coli UTI.  Took Nitrofurantoin for   There was a family gathering outside this last weekend.   PCP:  Betty [name], MD    Patient's last menstrual period was 10/01/1995 (approximate).            Sexually active: No.  The current method of family planning is post menopausal status.    Exercising: Yes.    walking Smoker:  no  Health Maintenance:  Pap:07-28-18 Neg,07/05/16 Neg, 05/14/13 Neg: HR HPV:neg  History of abnormal Pap:  no MMG: 05-17-19 3D/Neg/density B/BiRads1 Colonoscopy:  07/17/17 Polyps. F/u 5 years  BMD: 08-12-17  Result :Osteopenia TDaP: 06/2019 Gardasil:   no HIV: no Hep C:02-11-18 Neg Screening Labs:  PCP.  Flu vaccine:  Completed.  Shingrix:  Completed.    reports that she has never smoked. She has never used smokeless tobacco. She reports that she does not drink alcohol or use drugs.  Past Medical History:  Diagnosis Date  . Anxiety   . Cataract   . Colonic polyp 2005 & 2013  . DDD (degenerative disc disease)   . Depression   . GERD (gastroesophageal reflux disease)   . H/O echocardiogram    before 2000, no need for f/u /w cardiac   . Heart murmur    MVP- echo- 2010, no symptoms   . Memory disorder 09/19/2017  . Mononucleosis 1971  . Osteoporosis   . Pinched nerve in neck   . Sleep apnea     Past Surgical History:  Procedure Laterality Date  . ANTERIOR CERVICAL DECOMP/DISCECTOMY FUSION N/A 05/20/2013   Procedure: ANTERIOR CERVICAL DECOMPRESSION/DISCECTOMY FUSION 2 LEVELS;  Surgeon: Erline Levine, MD;  Location: [facility];  Service: Neurosurgery;  Laterality: N/A;  Cervical Seven-Thoracic One, Thoracic One-Two Anterior  cervical decompression/Diskectomy/Fusion  . CARPAL TUNNEL RELEASE Right   . CATARACT EXTRACTION, BILATERAL  2013   Dr Gershon Crane, Viona Gilmore IOL  . CERVICAL FUSION     2 proceduresr Vertell Limber  . colonoscopy with polypectomy  2013   Dr Deatra Ina  . FRACTURE SURGERY     L wrist - June 2014  . HERNIA REPAIR    . KNEE SURGERY     Dr Wonda Olds ; bursa cystectomy  . LUMBAR DISC SURGERY     Dr.Stern  . ROTATOR CUFF REPAIR     Dr.Wainer  . UMBILICAL HERNIA REPAIR    . WRIST FRACTURE SURGERY Left 02/2013   Dr. Kathryne Hitch    Current Outpatient Medications  Medication Sig Dispense Refill  . Acetaminophen (TYLENOL PO) Take by mouth.    Marland Kitchen alendronate (FOSAMAX) 70 MG tablet TAKE 1 TABLET EVERY SUNDAY. TAKE WITH A FULL GLASS OF  WATER ON AN EMPTY STOMACH 12 tablet 0  . Biotin 300 MCG TABS Take 300 mcg by mouth daily.      Marland Kitchen buPROPion (WELLBUTRIN XL) 300 MG 24 hr tablet TAKE 1 TABLET BY MOUTH  DAILY 90 tablet 0  . Calcium Carb-Cholecalciferol (CALCIUM 1000 + D PO) Take by mouth daily.    . Cholecalciferol (VITAMIN D) 2000 UNITS CAPS Take by mouth.    Marland Kitchen  diclofenac sodium (VOLTAREN) 1 % GEL Apply 2 g topically 4 (four) times daily. 3 Tube 3  . escitalopram (LEXAPRO) 10 MG tablet TAKE 1 TABLET BY MOUTH  DAILY 90 tablet 0  . Multiple Vitamin (MULTI-VITAMIN DAILY PO) Take by mouth daily.    . Omega-3 Fatty Acids (FISH OIL PO) Take by mouth.    . Vaginal Lubricant (REPLENS VA) Place vaginally every 3 (three) days.     No current facility-administered medications for this visit.     Family History  Problem Relation Age of Onset  . Diabetes Father   . Coronary artery disease Father        S/P CBAG , no MI -- Dec  . Depression Father   . Diverticulosis Father   . Breast cancer Mother        estorgen receptor positive  . Stroke Mother 64       Dec  . Breast cancer Sister        estrogen receptor positive  . Depression Sister   . Depression Sister   . Depression Sister   . Depression Sister   . Depression  Brother   . Bipolar disorder Other        Neice  . Colon polyps Sister   . Colon cancer Neg Hx   . Esophageal cancer Neg Hx   . Pancreatic cancer Neg Hx   . Rectal cancer Neg Hx   . Stomach cancer Neg Hx     Review of Systems  All other systems reviewed and are negative.   Exam:   BP 132/70   Pulse 76   Temp (!) 97.4 F (36.3 C) (Temporal)   Resp 12   Ht 4\' 10"  (1.473 m)   Wt 126 lb 9.6 oz (57.4 kg)   LMP 10/01/1995 (Approximate)   BMI 26.46 kg/m     General appearance: alert, cooperative and appears stated age Head: normocephalic, without obvious abnormality, atraumatic Neck: no adenopathy, supple, symmetrical, trachea midline and thyroid normal to inspection and palpation Lungs: clear to auscultation bilaterally Breasts: normal appearance, no masses or tenderness, No nipple retraction or dimpling, No nipple discharge or bleeding, No axillary adenopathy Heart: regular rate and rhythm Abdomen: soft, non-tender; no masses, no organomegaly Extremities: extremities normal, atraumatic, no cyanosis or edema Skin: skin color, texture, turgor normal. No rashes or lesions Lymph nodes: cervical, supraclavicular, and axillary nodes normal. Neurologic: grossly normal  Pelvic: External genitalia:  no lesions              No abnormal inguinal nodes palpated.              Urethra:  normal appearing urethra with no masses, tenderness or lesions              Bartholins and Skenes: normal                 Vagina: normal appearing vagina with normal color and discharge, no lesions              Cervix: no lesions              Pap taken: No. Bimanual Exam:  Uterus:  normal size, contour, position, consistency, mobility, non-tender              Adnexa: no mass, fullness, tenderness              Rectal exam: Yes.  .  Confirms.  Anus:  normal sphincter tone, no lesions  Chaperone was present for exam.  Assessment:   Well woman visit with normal exam. Recurrent UTI.   Osteopenia.  On Fosamax for 8 years.  Hx fracture of wrist. Hx anxiety and depression.   Plan: Mammogram screening discussed. Self breast awareness reviewed. Pap and HR HPV as above. Guidelines for Calcium, Vitamin D, regular exercise program including cardiovascular and weight bearing exercise. Fu BMD from today to determine length of treatment with Fosamax.  Rx for Nitrofurantoin 50 mg daily.  Refill of Wellbutrin XL and Lexapro.  Follow up annually and prn.   After visit summary provided.

## 2019-08-23 NOTE — Telephone Encounter (Signed)
Visit Follow-Up Question Received: Yesterday Message Contents  Tene, Amicucci M sent to [facility]  Phone Number: [phone]        Jerene Pitch,  I think the medication that I took for prevention of UTI was Nitrofurantoin. The prescription was given to me in 2016. See you Monday.

## 2019-08-25 ENCOUNTER — Telehealth: Payer: Self-pay | Admitting: Obstetrics and Gynecology

## 2019-08-25 DIAGNOSIS — M8580 Other specified disorders of bone density and structure, unspecified site: Secondary | ICD-10-CM

## 2019-08-25 NOTE — Telephone Encounter (Signed)
Please let patient know that her BMD from [facility] 08/23/19 shows osteopenia and not osteoporosis.   She lost significant bone density of both hips and her radius is stable.  Being off the Fosamax for one month will not cause this decline.  She has been on Fosamax for 8 years, and she is loosing bone density.   I think it is reasonable for her to have a consultation with an endocrinologist and determine if she needs another treatment option like Reclast or Prolia.  I am happy to make a referral for her as our office does not do injectables for osteopenia/osteoporosis.

## 2019-08-30 NOTE — Telephone Encounter (Signed)
Spoke with patient, advised per Dr. Quincy Simmonds. Patient is agreeable to proceed with referral to endocrinology. Reviewed options, order placed for [facility], Dr. Cruzita Lederer. Advised patient she will be contacted directly by their office to be scheduled. Patient verbalizes understanding and is agreeable.   Routing to provider for final review. Patient is agreeable to disposition. Will close encounter.  Cc: Magdalene Patricia

## 2019-09-13 ENCOUNTER — Other Ambulatory Visit: Payer: Self-pay

## 2019-09-13 ENCOUNTER — Ambulatory Visit (INDEPENDENT_AMBULATORY_CARE_PROVIDER_SITE_OTHER): Payer: Medicare Other | Admitting: Internal Medicine

## 2019-09-13 ENCOUNTER — Encounter: Payer: Self-pay | Admitting: Internal Medicine

## 2019-09-13 VITALS — BP 120/60 | HR 96 | Ht 58.86 in | Wt 130.0 lb

## 2019-09-13 DIAGNOSIS — E559 Vitamin D deficiency, unspecified: Secondary | ICD-10-CM

## 2019-09-13 DIAGNOSIS — M85859 Other specified disorders of bone density and structure, unspecified thigh: Secondary | ICD-10-CM | POA: Diagnosis not present

## 2019-09-13 LAB — VITAMIN D 25 HYDROXY (VIT D DEFICIENCY, FRACTURES): VITD: 92.12 ng/mL (ref 30.00–100.00)

## 2019-09-13 NOTE — Progress Notes (Signed)
Patient ID: Jessica Casey, female   DOB: 03/17/1951, 68 y.o.   MRN: [id number]   This visit occurred during the SARS-CoV-2 public health emergency.  Safety protocols were in place, including screening questions prior to the visit, additional usage of staff PPE, and extensive cleaning of exam room while observing appropriate contact time as indicated for disinfecting solutions.   HPI  Jessica Casey is a 68 y.o.-year-old female, referred by her OB/GYN doctor, Dr. Judeth Horn, for management of osteopenia (Op).  Pt was dx with osteoporosis in 2012, then osteopenia in 2016 onward.  I reviewed pt's DXA scans: Date L1-L4 T score FN T score 33% distal Radius  08/23/2019 ([facility]) n/a RFN: -1.9 LFN: -2.0 -1.4  08/12/2017 ([facility]) n/a RFN: -1.6 LFN: -1.6  -1.6  08/07/2015 ([facility]) n/a RFN: -1.9 LFN: -1.8  -0.7  05/07/2011 ([facility]) n/a RFN: -2.3 LFN: -2.5  -0.9   No history of frequent falls. No dizziness/vertigo/orthostasis/poor vision.   She has a history of fracture: - wrist in 2015 (lost balance on concrete)  Op treatments:  - Fosamax 70 mg weekly  - started 2012  She tolerates the bisphosphonate well, without jaw, hip, thigh pain.  No dental work in progress or coming up.  She has a h/o vitamin D deficiency. Reviewed available vit D levels: Lab Results  Component Value Date   VD25OH 79.96 02/10/2019   VD25OH 61 05/16/2014   VD25OH 28 (L) 11/01/2010   VD25OH 30 08/02/2009   Pt is on calcium and vitamin D: vit. D 2000 units + MVI + calcium-vitamin D  No weight bearing exercises except walking - 1.65 mi 3-4 a week.   She has a h/o 3 back surgeries - cervical area and also spinal fusion.  Back pain has  She does not take high vitamin A doses.  Menopause was at 68 y/o. She was on HRT x 2 years. However, BrCA in sister and mother (Estrogen R +) >> she was taken off the HRT.  FH of osteoporosis: sister - Op.  No h/o hyper/hypocalcemia or hyperparathyroidism. No h/o kidney stones. Lab  Results  Component Value Date   CALCIUM 9.3 02/10/2019   CALCIUM 9.4 08/06/2016   CALCIUM 9.4 02/28/2016   CALCIUM 9.6 08/08/2015   CALCIUM 9.0 05/16/2014   CALCIUM 9.6 05/13/2013   CALCIUM 8.9 02/17/2013   CALCIUM 7.8 (L) 06/28/2011   CALCIUM 9.2 11/01/2010   No h/o thyrotoxicosis. Reviewed TSH recent levels:  Lab Results  Component Value Date   TSH 2.04 08/06/2016   TSH 1.28 08/08/2015   TSH 2.109 05/16/2014   TSH 1.41 02/17/2013   TSH 1.57 11/01/2010   Of note, she is on biotin.  No h/o CKD. Last BUN/Cr: Lab Results  Component Value Date   BUN 16 02/10/2019   CREATININE 1.14 02/10/2019   She has OA.   ROS: Constitutional: no weight gain, no weight loss, no fatigue, no subjective hyperthermia, no subjective hypothermia, no nocturia Eyes: no blurry vision, no xerophthalmia ENT: no sore throat, no nodules palpated in neck, no dysphagia, no odynophagia, no hoarseness, no tinnitus, no hypoacusis Cardiovascular: no CP, no SOB, no palpitations, no leg swelling Respiratory: no cough, no SOB, no wheezing Gastrointestinal: no N, no V, no D, no C, no acid reflux Musculoskeletal: no muscle, no joint aches, + joint swelling (OA) Skin: no rash, no hair loss Neurological: no tremors, no numbness or tingling/no dizziness/no HAs Psychiatric: + depression, no anxiety  I reviewed pt's medications, allergies, PMH, social hx,  family hx, and changes were documented in the history of present illness. Otherwise, unchanged from my initial visit note.  Past Medical History:  Diagnosis Date  . Anxiety   . Cataract   . Colonic polyp 2005 & 2013  . DDD (degenerative disc disease)   . Depression   . GERD (gastroesophageal reflux disease)   . H/O echocardiogram    before 2000, no need for f/u /w cardiac   . Heart murmur    MVP- echo- 2010, no symptoms   . Memory disorder 09/19/2017  . Mononucleosis 1971  . Osteoporosis   . Pinched nerve in neck   . Sleep apnea    Past Surgical  History:  Procedure Laterality Date  . ANTERIOR CERVICAL DECOMP/DISCECTOMY FUSION N/A 05/20/2013   Procedure: ANTERIOR CERVICAL DECOMPRESSION/DISCECTOMY FUSION 2 LEVELS;  Surgeon: Erline Levine, MD;  Location: [facility];  Service: Neurosurgery;  Laterality: N/A;  Cervical Seven-Thoracic One, Thoracic One-Two Anterior cervical decompression/Diskectomy/Fusion  . CARPAL TUNNEL RELEASE Right   . CATARACT EXTRACTION, BILATERAL  2013   Dr Gershon Crane, Viona Gilmore IOL  . CERVICAL FUSION     2 proceduresr Vertell Limber  . colonoscopy with polypectomy  2013   Dr Deatra Ina  . FRACTURE SURGERY     L wrist - June 2014  . HERNIA REPAIR    . KNEE SURGERY     Dr Wonda Olds ; bursa cystectomy  . LUMBAR DISC SURGERY     Dr.Stern  . ROTATOR CUFF REPAIR     Dr.Wainer  . UMBILICAL HERNIA REPAIR    . WRIST FRACTURE SURGERY Left 02/2013   Dr. Kathryne Hitch   Social History   Socioeconomic History  . Marital status: Married    Spouse name: Not on file  . Number of children: 2  . Years of education: 16+  . Highest education level: Not on file  Occupational History  . Occupation: Music therapist- Retired  Tobacco Use  . Smoking status: Never Smoker  . Smokeless tobacco: Never Used  Substance and Sexual Activity  . Alcohol use: No    Alcohol/week: 0.0 standard drinks  . Drug use: No  . Sexual activity: Not Currently    Partners: Male    Birth control/protection: Post-menopausal  Other Topics Concern  . Not on file  Social History Narrative   Lives   Caffeine use:    Right handed    Social Determinants of Health   Financial Resource Strain:   . Difficulty of Paying Living Expenses: Not on file  Food Insecurity:   . Worried About Charity fundraiser in the Last Year: Not on file  . Ran Out of Food in the Last Year: Not on file  Transportation Needs:   . Lack of Transportation (Medical): Not on file  . Lack of Transportation (Non-Medical): Not on file  Physical Activity:   . Days of Exercise per Week: Not on file   . Minutes of Exercise per Session: Not on file  Stress:   . Feeling of Stress : Not on file  Social Connections:   . Frequency of Communication with Friends and Family: Not on file  . Frequency of Social Gatherings with Friends and Family: Not on file  . Attends Religious Services: Not on file  . Active Member of Clubs or Organizations: Not on file  . Attends Archivist Meetings: Not on file  . Marital Status: Not on file  Intimate Partner Violence:   . Fear of Current or Ex-Partner: Not on  file  . Emotionally Abused: Not on file  . Physically Abused: Not on file  . Sexually Abused: Not on file   Current Outpatient Medications on File Prior to Visit  Medication Sig Dispense Refill  . Acetaminophen (TYLENOL PO) Take by mouth.    Marland Kitchen alendronate (FOSAMAX) 70 MG tablet TAKE 1 TABLET EVERY SUNDAY. TAKE WITH A FULL GLASS OF  WATER ON AN EMPTY STOMACH 12 tablet 0  . Biotin 300 MCG TABS Take 300 mcg by mouth daily.      Marland Kitchen buPROPion (WELLBUTRIN XL) 300 MG 24 hr tablet Take 1 tablet (300 mg total) by mouth daily. 90 tablet 3  . Calcium Carb-Cholecalciferol (CALCIUM 1000 + D PO) Take by mouth daily.    . Cholecalciferol (VITAMIN D) 2000 UNITS CAPS Take by mouth.    . diclofenac sodium (VOLTAREN) 1 % GEL Apply 2 g topically 4 (four) times daily. 3 Tube 3  . escitalopram (LEXAPRO) 10 MG tablet Take 1 tablet (10 mg total) by mouth daily. 90 tablet 3  . Multiple Vitamin (MULTI-VITAMIN DAILY PO) Take by mouth daily.    . nitrofurantoin (MACRODANTIN) 50 MG capsule Take one capsule by mouth daily. 90 capsule 0  . Omega-3 Fatty Acids (FISH OIL PO) Take by mouth.    . Vaginal Lubricant (REPLENS VA) Place vaginally every 3 (three) days.     No current facility-administered medications on file prior to visit.   Allergies  Allergen Reactions  . Gabapentin     REACTION: TOUNGE SWELLING, ITCHING   Family History  Problem Relation Age of Onset  . Diabetes Father   . Coronary artery disease  Father        S/P CBAG , no MI -- Dec  . Depression Father   . Diverticulosis Father   . Breast cancer Mother        estorgen receptor positive  . Stroke Mother 69       Dec  . Breast cancer Sister        estrogen receptor positive  . Depression Sister   . Depression Sister   . Depression Sister   . Depression Sister   . Depression Brother   . Bipolar disorder Other        Neice  . Colon polyps Sister   . Colon cancer Neg Hx   . Esophageal cancer Neg Hx   . Pancreatic cancer Neg Hx   . Rectal cancer Neg Hx   . Stomach cancer Neg Hx     PE: BP 120/60   Pulse 96   Ht 4' 10.86" (1.495 m) Comment: measured today  Wt 130 lb (59 kg)   LMP 10/01/1995 (Approximate)   SpO2 98%   BMI 26.38 kg/m  Wt Readings from Last 3 Encounters:  09/13/19 130 lb (59 kg)  08/23/19 126 lb 9.6 oz (57.4 kg)  08/18/19 127 lb 4 oz (57.7 kg)   Constitutional: Normal weight, in NAD. No kyphosis. Eyes: PERRLA, EOMI, no exophthalmos ENT: moist mucous membranes, no thyromegaly, no cervical lymphadenopathy Cardiovascular: tachycardia, RR, No MRG Respiratory: CTA B Gastrointestinal: abdomen soft, NT, ND, BS+ Musculoskeletal: no deformities, strength intact in all 4 Skin: moist, warm, no rashes Neurological: no tremor with outstretched hands, DTR normal in all 4  Assessment: 1. Osteoporosis  2.  Vitamin D deficiency  Plan: 1. Osteoporosis - likely postmenopausal (she had early menopause) and age-related, she also has FH of Op in sister -The risk of fracture is greatly increased when the T  score is lower than -2.5, but it is actually a continuum and -2.5 should not be regarded as an absolute threshold. We reviewed her DXA scan report together, and I explained that based on the latest, worsened, femoral neck T scores, and also due to her 2015 fragility fracture, she has an increased risk for fractures.  - we reviewed her dietary and supplemental calcium and vitamin D intake. I recommended to make  sure she gets 1000-1200 mg of calcium daily preferentially from diet and I will check vit D today to see if she needs further supplementation.  As of now, she is getting more calcium from supplements and we discussed about reducing these - discussed fall precautions   - given handout from [place] Re: weight bearing exercises - advised to do this every day or at least 5/7 days - we discussed about maintaining a good amount of protein in her diet. The recommended daily protein intake is ~0.8 g per kilogram per day. I advised her to try to aim for this amount, since a diet low in proteins can exacerbate osteoporosis. Also, avoid smoking or >2 drinks of alcohol a day. - We discussed about the different medication classes, benefits and side effects (including atypical fractures and ONJ - no dental workup in progress or planned).  - Since she has been on Fosamax for 8 years and her T-scores are worse at the last check, the first options would be sq denosumab (Prolia) sq every 6 months. I would use Teriparatide/Abaloparatide (which are daily subcu medication) or Romosozumab (monthly) as a last resort, in case Prolia does not work well for her. - Pt was given reading information about Prolia, and I explained the mechanism of action and expected benefits.  - Will check the following labs today: Orders Placed This Encounter  Procedures  . VITAMIN D 25 Hydroxy (Vit-D Deficiency, Fractures)  . BASIC METABOLIC PANEL WITH GFR  - if labs normal, will arrange for a Prolia inj - will check a new DXA scan in 2 years after starting Prolia -  I explained that the first indication that the treatment is working is her not having anymore fractures. DEXA scan changes are secondary: unchanged or slightly higher T-scores are desirable - will see pt back in a year  2.  Vitamin D deficiency -She has a history of vitamin D deficiency and has been on supplementation for many years -Latest level from  01/2019 reviewed and this was normal -We will recheck this today -We will continue her vitamin D supplementation (at least 3000 units daily)  Component     Latest Ref Rng & Units 09/13/2019  Glucose     65 - 99 mg/dL 84  BUN     7 - 25 mg/dL 17  Creatinine     0.50 - 0.99 mg/dL 1.11 (H)  GFR, Est Non African American     > OR = 60 mL/min/1.44m 51 (L)  GFR, Est African American     > OR = 60 mL/min/1.750m59 (L)  BUN/Creatinine Ratio     6 - 22 (calc) 15  Sodium     135 - 146 mmol/L 142  Potassium     3.5 - 5.3 mmol/L 4.1  Chloride     98 - 110 mmol/L 105  CO2     20 - 32 mmol/L 28  Calcium     8.6 - 10.4 mg/dL 9.2  VITD     30.00 - 100.00 ng/mL 92.12  Kidney function still slightly low, but it allow [place] to use Prolia.  Vitamin D and calcium normal.  Philemon Kingdom, MD PhD [facility]

## 2019-09-13 NOTE — Patient Instructions (Addendum)
Please stop at the lab.  Please decrease calcium to 1200 mg daily (from diet and supplements).  Please come back for a follow-up appointment in 1 year.   How Can I Prevent Falls? Men and women with osteoporosis need to take care not to fall down. Falls can break bones. Some reasons people fall are: Poor vision  Poor balance  Certain diseases that affect how you walk  Some types of medicine, such as sleeping pills.  Some tips to help prevent falls outdoors are: Use a cane or walker  Wear rubber-soled shoes so you don't slip  Walk on grass when sidewalks are slippery  In winter, put salt or kitty litter on icy sidewalks.  Some ways to help prevent falls indoors are: Keep rooms free of clutter, especially on floors  Use plastic or carpet runners on slippery floors  Wear low-heeled shoes that provide good support  Do not walk in socks, stockings, or slippers  Be sure carpets and area rugs have skid-proof backs or are tacked to the floor  Be sure stairs are well lit and have rails on both sides  Put grab bars on bathroom walls near tub, shower, and toilet  Use a rubber bath mat in the shower or tub  Keep a flashlight next to your bed  Use a sturdy step stool with a handrail and wide steps  Add more lights in rooms (and night lights) Buy a cordless phone to keep with you so that you don't have to rush to the phone       when it rings and so that you can call for help if you fall.   (adapted from http://www.niams.NightlifePreviews.se)   Exercise for Strong Bones (from [employer]) There are two types of exercises that are important for building and maintaining bone density:  weight-bearing and muscle-strengthening exercises. Weight-bearing Exercises These exercises include activities that make you move against gravity while staying upright. Weight-bearing exercises can be high-impact or low-impact. High-impact weight-bearing  exercises help build bones and keep them strong. If you have broken a bone due to osteoporosis or are at risk of breaking a bone, you may need to avoid high-impact exercises. If you're not sure, you should check with your healthcare provider. Examples of high-impact weight-bearing exercises are: . Dancing . Doing high-impact aerobics . Hiking . Jogging/running . Jumping Rope . Stair climbing . Tennis Low-impact weight-bearing exercises can also help keep bones strong and are a safe alternative if you cannot do high-impact exercises. Examples of low-impact weight-bearing exercises are: . Using elliptical training machines . Doing low-impact aerobics . Using stair-step machines . Fast walking on a treadmill or outside Muscle-Strengthening Exercises These exercises include activities where you move your body, a weight or some other resistance against gravity. They are also known as resistance exercises and include: . Lifting weights . Using elastic exercise bands . Using weight machines . Lifting your own body weight . Functional movements, such as standing and rising up on your toes Yoga and Pilates can also improve strength, balance and flexibility. However, certain positions may not be safe for people with osteoporosis or those at increased risk of broken bones. For example, exercises that have you bend forward may increase the chance of breaking a bone in the spine. A physical therapist should be able to help you learn which exercises are safe and appropriate for you. Non-Impact Exercises Non-impact exercises can help you to improve balance, posture and how well you move in everyday activities. These  exercises can also help to increase muscle strength and decrease the risk of falls and broken bones. Some of these exercises include: . Balance exercises that strengthen your legs and test your balance, such as Tai Chi, can decrease your risk of falls. . Posture exercises that improve your  posture and reduce rounded or "sloping" shoulders can help you decrease the chance of breaking a bone, especially in the spine. . Functional exercises that improve how well you move can help you with everyday activities and decrease your chance of falling and breaking a bone. For example, if you have trouble getting up from a chair or climbing stairs, you should do these activities as exercises. A physical therapist can teach you balance, posture and functional exercises. Starting a New Exercise Program If you haven't exercised regularly for a while, check with your healthcare provider before beginning a new exercise program--particularly if you have health problems such as heart disease, diabetes or high blood pressure. If you're at high risk of breaking a bone, you should work with a physical therapist to develop a safe exercise program. Once you have your healthcare provider's approval, start slowly. If you've already broken bones in the spine because of osteoporosis, be very careful to avoid activities that require reaching down, bending forward, rapid twisting motions, heavy lifting and those that increase your chance of a fall. As you get started, your muscles may feel sore for a day or two after you exercise. If soreness lasts longer, you may be working too hard and need to ease up. Exercises should be done in a pain-free range of motion. How Much Exercise Do You Need? Weight-bearing exercises 30 minutes on most days of the week. Do a 30-minutesession or multiple sessions spread out throughout the day. The benefits to your bones are the same.   Muscle-strengthening exercises Two to three days per week. If you don't have much time for strengthening/resistance training, do small amounts at a time. You can do just one body part each day. For example do arms one day, legs the next and trunk the next. You can also spread these exercises out during your normal day.  Balance, posture and functional  exercises Every day or as often as needed. You may want to focus on one area more than the others. If you have fallen or lose your balance, spend time doing balance exercises. If you are getting rounded shoulders, work more on posture exercises. If you have trouble climbing stairs or getting up from the couch, do more functional exercises. You can also perform these exercises at one time or spread them during your day. Work with a phyiscal therapist to learn the right exercises for you.   Denosumab: Patient drug information (Up-to-date) Copyright 501-638-0994 [place] rights reserved.  Brand Names: [place]  ProliaDelton See What is this drug used for?  .It is used to treat soft, brittle bones (osteoporosis).  .It is used for bone growth.  .It is used when treating some cancers.  .It may be given to you for other reasons. Talk with the doctor. What do I need to tell my doctor BEFORE I take this drug?  All products:  .If you have an allergy to denosumab or any other part of this drug.  .If you are allergic to any drugs like this one, any other drugs, foods, or other substances. Tell your doctor about the allergy and what signs you had, like rash; hives; itching; shortness of breath; wheezing; cough; swelling of  face, lips, tongue, or throat; or any other signs.  .If you have low calcium levels.  ProliaT:  .If you are pregnant or may be pregnant. Do not take this drug if you are pregnant.  This is not a list of all drugs or health problems that interact with this drug.  Tell your doctor and pharmacist about all of your drugs (prescription or OTC, natural products, vitamins) and health problems. You must check to make sure that it is safe for you to take this drug with all of your drugs and health problems. Do not start, stop, or change the dose of any drug without checking with your doctor. What are some things I need to know or do while I take this drug?  All products:  .Tell dentists,  surgeons, and other doctors that you use this drug.  .This drug may raise the chance of a broken leg. Talk with your doctor.  .Have your blood work checked. Talk with your doctor.  .Have a bone density test. Talk with your doctor.  .Take calcium and vitamin D as you were told by your doctor.  .Have a dental exam before starting this drug.  .Take good care of your teeth. See a dentist often.  .If you smoke, talk with your doctor.  .Do not give to a child. Talk with your doctor.  .Tell your doctor if you are breast-feeding. You will need to talk about any risks to your baby.  Xgeva:  .This drug may cause harm to the unborn baby if you take it while you are pregnant. If you get pregnant while taking this drug, call your doctor right away.  ProliaT:  .Very bad infections have been reported with use of this drug. If you have any infection, are taking antibiotics now or in the recent past, or have many infections, talk with your doctor.  .You may have more chance of getting an infection. Wash hands often. Stay away from people with infections, colds, or flu.  .Use birth control that you can trust to prevent pregnancy while taking this drug.  .If you are a man and your sex partner is pregnant or gets pregnant at any time while you are being treated, talk with your doctor. What are some side effects that I need to call my doctor about right away?  WARNING/CAUTION: Even though it may be rare, some people may have very bad and sometimes deadly side effects when taking a drug. Tell your doctor or get medical help right away if you have any of the following signs or symptoms that may be related to a very bad side effect:  All products:  .Signs of an allergic reaction, like rash; hives; itching; red, swollen, blistered, or peeling skin with or without fever; wheezing; tightness in the chest or throat; trouble breathing or talking; unusual hoarseness; or swelling of the mouth, face, lips, tongue, or throat.   .Signs of low calcium levels like muscle cramps or spasms, numbness and tingling, or seizures.  .Mouth sores.  .Any new or strange groin, hip, or thigh pain.  .This drug may cause jawbone problems. The chance may be higher the longer you take this drug. The chance may be higher if you have cancer, dental problems, dentures that do not fit well, anemia, blood clotting problems, or an infection. The chance may also be higher if you are having dental work or if you are getting chemo, some steroid drugs, or radiation. Call your doctor right away if you have jaw  swelling or pain.  Xgeva:  .Not hungry.  .Muscle pain or weakness.  .Seizures.  .Shortness of breath.  ProliaT:  .Signs of infection. These include a fever of 100.91F (38C) or higher, chills, very bad sore throat, ear or sinus pain, cough, more sputum or change in color of sputum, pain with passing urine, mouth sores, wound that will not heal, or anal itching or pain.  .Signs of a pancreas problem (pancreatitis) like very bad stomach pain, very bad back pain, or very bad upset stomach or throwing up.  .Chest pain.  .A heartbeat that does not feel normal.  .Very bad skin irritation.  .Feeling very tired or weak.  .Bladder pain or pain when passing urine or change in how much urine is passed.  .Passing urine often.  .Swelling in the arms or legs. What are some other side effects of this drug?  All drugs may cause side effects. However, many people have no side effects or only have minor side effects. Call your doctor or get medical help if any of these side effects or any other side effects bother you or do not go away:  Xgeva:  .Feeling tired or weak.  [place] KitchenHeadache.  Mar[place] KitchenUpset stomach or throwing up.  .Loose stools (diarrhea).  .Cough.  ProliaT:  .Back pain.  .Muscle or joint pain.  .Sore throat.  .Runny nose.  .Pain in arms or legs.  These are not all of the side effects that may occur. If you have questions about side effects,  call your doctor. Call your doctor for medical advice about side effects.  You may report side effects to your national health agency. How is this drug best taken?  Use this drug as ordered by your doctor. Read and follow the dosing on the label closely.  .It is given as a shot into the fatty part of the skin. What do I do if I miss a dose?  .Call the doctor to find out what to do. How do I store and/or throw out this drug?  Marland KitchenThis drug will be given to you in a hospital or doctor's office. You will not store it at home.  Marland KitchenKeep all drugs out of the reach of children and pets.  .Check with your pharmacist about how to throw out unused drugs.  General drug facts  .If your symptoms or health problems do not get better or if they become worse, call your doctor.  .Do not share your drugs with others and do not take anyone else's drugs.  Marland KitchenKeep a list of all your drugs (prescription, natural products, vitamins, OTC) with you. Give this list to your doctor.  .Talk with the doctor before starting any new drug, including prescription or OTC, natural products, or vitamins.  .Some drugs may have another patient information leaflet. If you have any questions about this drug, please talk with your doctor, pharmacist, or other health care provider.  .If you think there has been an overdose, call your poison control center or get medical care right away. Be ready to tell or show what was taken, how much, and when it happened.

## 2019-09-14 ENCOUNTER — Encounter: Payer: Self-pay | Admitting: Internal Medicine

## 2019-09-14 ENCOUNTER — Telehealth: Payer: Self-pay | Admitting: *Deleted

## 2019-09-14 LAB — BASIC METABOLIC PANEL WITH GFR
BUN/Creatinine Ratio: 15 (calc) (ref 6–22)
BUN: 17 mg/dL (ref 7–25)
CO2: 28 mmol/L (ref 20–32)
Calcium: 9.2 mg/dL (ref 8.6–10.4)
Chloride: 105 mmol/L (ref 98–110)
Creat: 1.11 mg/dL — ABNORMAL HIGH (ref 0.50–0.99)
GFR, Est African American: 59 mL/min/{1.73_m2} — ABNORMAL LOW (ref >=60)
GFR, Est Non African American: 51 mL/min/{1.73_m2} — ABNORMAL LOW (ref >=60)
Glucose, Bld: 84 mg/dL (ref 65–99)
Potassium: 4.1 mmol/L (ref 3.5–5.3)
Sodium: 142 mmol/L (ref 135–146)

## 2019-09-14 NOTE — Telephone Encounter (Signed)
DOS 09/16/2019 [facility] [place] - [phone], HAMMER TOE REPAIR 2ND - 91225, AND CAPSULOTOMY MPJ RELEASE JOINT 2ND - 83462 OF THE RIGHT FOOT  UHC MEDICARE: Eligibility Date - 09/30/2018 - 09/30/2019  Member's plan does not have a deductible.  Out-of-Pocket Maximum Per Service Year $496.17 of $3,300.00 Met Remaining: $2,803.83  Co-pay $250 / Day OUTPATIENT SURGERY  $100 / [place]  Co-Insurance 0% / Day OUTPATIENT SURGERY  0% / [place]  This [employer] Medicare Advantage members plan does not currently require a prior authorization for these services. If you have general questions about the prior authorization requirements, please call us at [phone] or visit [email or web address] > Clinician Resources > Advance and Admission Notification Requirements. The number above acknowledges your notification. Please write this number down for future reference. Notification is not a guarantee of coverage or payment.  Decision ID #:[id number]

## 2019-09-16 ENCOUNTER — Other Ambulatory Visit: Payer: Self-pay | Admitting: Podiatry

## 2019-09-16 ENCOUNTER — Encounter: Payer: Self-pay | Admitting: Podiatry

## 2019-09-16 DIAGNOSIS — M7751 Other enthesopathy of right foot: Secondary | ICD-10-CM | POA: Diagnosis not present

## 2019-09-16 DIAGNOSIS — M2011 Hallux valgus (acquired), right foot: Secondary | ICD-10-CM | POA: Diagnosis not present

## 2019-09-16 DIAGNOSIS — M2041 Other hammer toe(s) (acquired), right foot: Secondary | ICD-10-CM | POA: Diagnosis not present

## 2019-09-16 MED ORDER — MELOXICAM 15 MG PO TABS: 15.0000 mg | ORAL_TABLET | Freq: Every day | ORAL | 1 refills | Status: DC

## 2019-09-16 MED ORDER — OXYCODONE-ACETAMINOPHEN 5-325 MG PO TABS: 1.0000 | ORAL_TABLET | Freq: Four times a day (QID) | ORAL | 0 refills | Status: DC | PRN

## 2019-09-16 NOTE — Progress Notes (Signed)
PRN postop 

## 2019-09-20 ENCOUNTER — Ambulatory Visit (INDEPENDENT_AMBULATORY_CARE_PROVIDER_SITE_OTHER): Payer: Medicare Other | Admitting: Podiatry

## 2019-09-20 ENCOUNTER — Other Ambulatory Visit: Payer: Self-pay

## 2019-09-20 ENCOUNTER — Ambulatory Visit (INDEPENDENT_AMBULATORY_CARE_PROVIDER_SITE_OTHER): Payer: Medicare Other

## 2019-09-20 DIAGNOSIS — M2041 Other hammer toe(s) (acquired), right foot: Secondary | ICD-10-CM | POA: Diagnosis not present

## 2019-09-20 DIAGNOSIS — M21619 Bunion of unspecified foot: Secondary | ICD-10-CM

## 2019-09-20 DIAGNOSIS — Z9889 Other specified postprocedural states: Secondary | ICD-10-CM

## 2019-09-21 ENCOUNTER — Telehealth: Payer: Self-pay | Admitting: Internal Medicine

## 2019-09-21 NOTE — Telephone Encounter (Signed)
Still awaiting SOB from [employer]

## 2019-09-21 NOTE — Telephone Encounter (Signed)
-----   Message from Philemon Kingdom, MD sent at 09/15/2019 10:28 AM EST ----- Doristine Devoid! Thank you for the update! C

## 2019-09-24 ENCOUNTER — Encounter: Payer: Self-pay | Admitting: Podiatry

## 2019-09-25 NOTE — Progress Notes (Signed)
   Subjective:  Patient presents today status post bunionectomy and hammertoe repair 2nd digit right. DOS: 09/16/2019. She states she is doing well. She denies any significant pain or modifying factors. She has been using the CAM boot and taking Meloxicam as needed. Patient is here for further evaluation and treatment.    Past Medical History:  Diagnosis Date  . Anxiety   . Cataract   . Colonic polyp 2005 & 2013  . DDD (degenerative disc disease)   . Depression   . GERD (gastroesophageal reflux disease)   . H/O echocardiogram    before 2000, no need for f/u /w cardiac   . Heart murmur    MVP- echo- 2010, no symptoms   . Memory disorder 09/19/2017  . Mononucleosis 1971  . Osteoporosis   . Pinched nerve in neck   . Sleep apnea       Objective/Physical Exam Neurovascular status intact.  Skin incisions appear to be well coapted with sutures and staples intact. No sign of infectious process noted. No dehiscence. No active bleeding noted. Moderate edema noted to the surgical extremity.  Radiographic Exam:  Orthopedic hardware and osteotomies sites appear to be stable with routine healing.  Assessment: 1. s/p bunionectomy and hammertoe repair 2nd toe right. DOS: 09/16/2019   Plan of Care:  1. Patient was evaluated. X-rays reviewed 2. Dressing changed. Keep clean, dry and intact for one week.  3. Continue weightbearing in CAM boot.  4. Continue taking Meloxicam as needed.  5. Return to clinic in one week for staple/suture removal.    Edrick Kins, DPM [facility]  Dr. Edrick Kins, [address]                                        [place], [place] [address]                Office [phone]  Fax [phone]

## 2019-09-29 ENCOUNTER — Other Ambulatory Visit: Payer: Self-pay

## 2019-09-29 ENCOUNTER — Ambulatory Visit (INDEPENDENT_AMBULATORY_CARE_PROVIDER_SITE_OTHER): Payer: Medicare Other | Admitting: Podiatry

## 2019-09-29 DIAGNOSIS — Z9889 Other specified postprocedural states: Secondary | ICD-10-CM

## 2019-09-29 DIAGNOSIS — M21619 Bunion of unspecified foot: Secondary | ICD-10-CM

## 2019-09-29 DIAGNOSIS — M2041 Other hammer toe(s) (acquired), right foot: Secondary | ICD-10-CM

## 2019-09-30 ENCOUNTER — Telehealth: Payer: Self-pay

## 2019-09-30 NOTE — Telephone Encounter (Signed)
Patient had appointment yesterday, but the note is not in the chart yet. She called stating she was supposed to get an antibiotic, but nothing has been called in to her pharmacy. Allergy to gabapentin  Please send Thanks, Nira Conn

## 2019-10-02 ENCOUNTER — Other Ambulatory Visit: Payer: Self-pay | Admitting: Obstetrics and Gynecology

## 2019-10-04 NOTE — Telephone Encounter (Signed)
Medication refill request: Fosamax  Last AEX:  08/23/19 Next AEX: 08/28/20 Last MMG (if hormonal medication request): 05/17/19 Bi-rads 1 neg  Refill authorized:  #12 tab with 1 RF please advise.

## 2019-10-05 NOTE — Telephone Encounter (Signed)
Please contact patient in follow up to this request for Fosamax.   Patient was referred to endocrinology for her worsening bone density on Fosamax, and she is expected to be starting Prolia through that provider.

## 2019-10-06 ENCOUNTER — Ambulatory Visit (INDEPENDENT_AMBULATORY_CARE_PROVIDER_SITE_OTHER): Payer: Self-pay | Admitting: Podiatry

## 2019-10-06 ENCOUNTER — Other Ambulatory Visit: Payer: Self-pay

## 2019-10-06 DIAGNOSIS — Z9889 Other specified postprocedural states: Secondary | ICD-10-CM

## 2019-10-06 DIAGNOSIS — M2041 Other hammer toe(s) (acquired), right foot: Secondary | ICD-10-CM

## 2019-10-06 MED ORDER — DOXYCYCLINE HYCLATE 100 MG PO TABS: 100.0000 mg | ORAL_TABLET | Freq: Two times a day (BID) | ORAL | 0 refills | Status: DC

## 2019-10-06 NOTE — Telephone Encounter (Signed)
Spoke to pt. Pt states saw Dr Cruzita Lederer at [facility] and is waiting on insurance approval for Prolia. Pt is no longer taking Fosamax.   Will route to Dr Quincy Simmonds for final review and will close encounter. Rx refused and taken out of pt's med list.

## 2019-10-06 NOTE — Progress Notes (Signed)
   Subjective:  Patient presents today status post bunionectomy and hammertoe repair 2nd digit right. DOS: 09/16/2019. She reports some soreness to the dorsal midfoot. She has been using the CAM boot as directed. There are no aggravating factors noted. Patient is here for further evaluation and treatment.   Past Medical History:  Diagnosis Date  . Anxiety   . Cataract   . Colonic polyp 2005 & 2013  . DDD (degenerative disc disease)   . Depression   . GERD (gastroesophageal reflux disease)   . H/O echocardiogram    before 2000, no need for f/u /w cardiac   . Heart murmur    MVP- echo- 2010, no symptoms   . Memory disorder 09/19/2017  . Mononucleosis 1971  . Osteoporosis   . Pinched nerve in neck   . Sleep apnea       Objective/Physical Exam Neurovascular status intact.  Skin incisions appear to be well coapted with sutures and staples intact. No sign of infectious process noted. No dehiscence. No active bleeding noted. Moderate edema noted to the surgical extremity.  Assessment: 1. s/p bunionectomy and hammertoe repair 2nd toe right. DOS: 09/16/2019   Plan of Care:  1. Patient was evaluated. 2. Continue weightbearing in CAM boot.  3. Partial staples removed. Dry sterile dressing applied.  4. Return to clinic in one week for remaining staple removal.     Edrick Kins, DPM [facility]  Dr. Edrick Kins, [address]                                        [place], [place] [address]                Office [phone]  Fax [phone]

## 2019-10-10 NOTE — Progress Notes (Signed)
   Subjective:  Patient presents today status post bunionectomy and hammertoe repair 2nd digit right. DOS: 09/16/2019. She reports continued aching pain but denies any worsening symptoms. She states she is no longer taking the Meloxicam and never got the antibiotic. There are no modifying factors noted. She has been using the CAM boot as directed. Patient is here for further evaluation and treatment.   Past Medical History:  Diagnosis Date  . Anxiety   . Cataract   . Colonic polyp 2005 & 2013  . DDD (degenerative disc disease)   . Depression   . GERD (gastroesophageal reflux disease)   . H/O echocardiogram    before 2000, no need for f/u /w cardiac   . Heart murmur    MVP- echo- 2010, no symptoms   . Memory disorder 09/19/2017  . Mononucleosis 1971  . Osteoporosis   . Pinched nerve in neck   . Sleep apnea       Objective/Physical Exam Neurovascular status intact.  Skin incisions appear to be well coapted with sutures and staples intact. No sign of infectious process noted. No dehiscence. No active bleeding noted. Moderate edema noted to the surgical extremity.  Assessment: 1. s/p bunionectomy and hammertoe repair 2nd toe right. DOS: 09/16/2019   Plan of Care:  1. Patient was evaluated. 2. Remaining staples removed.  3. Prescription for Doxycycline 100 mg #14 provided to patient.  4. Continue using CAM boot.  5. Return to clinic in 2 weeks for percutaneous pin removal.     Edrick Kins, DPM [facility]  Dr. Edrick Kins, [address]                                        [place], [place] [address]                Office [phone]  Fax [phone]

## 2019-10-13 ENCOUNTER — Ambulatory Visit: Payer: Medicare Other | Admitting: Obstetrics and Gynecology

## 2019-10-13 ENCOUNTER — Encounter: Payer: Medicare Other | Admitting: Podiatry

## 2019-10-18 ENCOUNTER — Other Ambulatory Visit: Payer: Self-pay | Admitting: Obstetrics and Gynecology

## 2019-10-20 ENCOUNTER — Ambulatory Visit (INDEPENDENT_AMBULATORY_CARE_PROVIDER_SITE_OTHER): Payer: Medicare PPO

## 2019-10-20 ENCOUNTER — Other Ambulatory Visit: Payer: Self-pay

## 2019-10-20 ENCOUNTER — Telehealth: Payer: Self-pay | Admitting: *Deleted

## 2019-10-20 ENCOUNTER — Ambulatory Visit (INDEPENDENT_AMBULATORY_CARE_PROVIDER_SITE_OTHER): Payer: Medicare PPO | Admitting: Podiatry

## 2019-10-20 DIAGNOSIS — M2041 Other hammer toe(s) (acquired), right foot: Secondary | ICD-10-CM

## 2019-10-20 DIAGNOSIS — Z9889 Other specified postprocedural states: Secondary | ICD-10-CM

## 2019-10-20 NOTE — Telephone Encounter (Signed)
She can drive starting today. - Dr. Amalia Hailey

## 2019-10-20 NOTE — Telephone Encounter (Signed)
Pt states she was seen in office today and forgot to ask when she could drive.

## 2019-10-21 NOTE — Telephone Encounter (Signed)
Left message informing pt of DR. Evans 10/20/2019 5:51pm message.

## 2019-10-22 DIAGNOSIS — M25671 Stiffness of right ankle, not elsewhere classified: Secondary | ICD-10-CM | POA: Diagnosis not present

## 2019-10-22 DIAGNOSIS — M25474 Effusion, right foot: Secondary | ICD-10-CM | POA: Diagnosis not present

## 2019-10-22 DIAGNOSIS — R269 Unspecified abnormalities of gait and mobility: Secondary | ICD-10-CM | POA: Diagnosis not present

## 2019-10-22 DIAGNOSIS — M25674 Stiffness of right foot, not elsewhere classified: Secondary | ICD-10-CM | POA: Diagnosis not present

## 2019-10-22 DIAGNOSIS — M79671 Pain in right foot: Secondary | ICD-10-CM | POA: Diagnosis not present

## 2019-10-22 NOTE — Progress Notes (Signed)
   Subjective:  Patient presents today status post bunionectomy and hammertoe repair 2nd digit right. DOS: 09/16/2019. She reports some numbness of the dorsal foot that began one week ago. She reports discomfort from the percutaneous pin. She has been using the CAM boot as directed. There are no other modifying factors noted. Patient is here for further evaluation and treatment.   Past Medical History:  Diagnosis Date  . Anxiety   . Cataract   . Colonic polyp 2005 & 2013  . DDD (degenerative disc disease)   . Depression   . GERD (gastroesophageal reflux disease)   . H/O echocardiogram    before 2000, no need for f/u /w cardiac   . Heart murmur    MVP- echo- 2010, no symptoms   . Memory disorder 09/19/2017  . Mononucleosis 1971  . Osteoporosis   . Pinched nerve in neck   . Sleep apnea       Objective/Physical Exam Neurovascular status intact.  Skin incisions appear to be well coapted. No sign of infectious process noted. No dehiscence. No active bleeding noted. Moderate edema noted to the surgical extremity.  Radiographic Exam:  Orthopedic hardware and osteotomies sites appear to be stable with routine healing.  Assessment: 1. s/p bunionectomy and hammertoe repair 2nd toe right. DOS: 09/16/2019   Plan of Care:  1. Patient was evaluated. X-Rays reviewed.  2. Percutaneous pin removed.  3. Post op shoe dispensed. Discontinue using CAM boot.  4. Orders for physical therapy at [facility] placed.  5. Return to clinic in 4 weeks.     Edrick Kins, DPM [facility]  Dr. Edrick Kins, [address]                                        [place], [place] [address]                Office [phone]  Fax [phone]

## 2019-10-26 DIAGNOSIS — M25671 Stiffness of right ankle, not elsewhere classified: Secondary | ICD-10-CM | POA: Diagnosis not present

## 2019-10-26 DIAGNOSIS — R269 Unspecified abnormalities of gait and mobility: Secondary | ICD-10-CM | POA: Diagnosis not present

## 2019-10-26 DIAGNOSIS — M79671 Pain in right foot: Secondary | ICD-10-CM | POA: Diagnosis not present

## 2019-10-26 DIAGNOSIS — M25674 Stiffness of right foot, not elsewhere classified: Secondary | ICD-10-CM | POA: Diagnosis not present

## 2019-10-26 DIAGNOSIS — M62571 Muscle wasting and atrophy, not elsewhere classified, right ankle and foot: Secondary | ICD-10-CM | POA: Diagnosis not present

## 2019-10-26 DIAGNOSIS — M25474 Effusion, right foot: Secondary | ICD-10-CM | POA: Diagnosis not present

## 2019-10-28 DIAGNOSIS — R269 Unspecified abnormalities of gait and mobility: Secondary | ICD-10-CM | POA: Diagnosis not present

## 2019-10-28 DIAGNOSIS — M25674 Stiffness of right foot, not elsewhere classified: Secondary | ICD-10-CM | POA: Diagnosis not present

## 2019-10-28 DIAGNOSIS — M79671 Pain in right foot: Secondary | ICD-10-CM | POA: Diagnosis not present

## 2019-10-28 DIAGNOSIS — M25474 Effusion, right foot: Secondary | ICD-10-CM | POA: Diagnosis not present

## 2019-10-28 DIAGNOSIS — M25671 Stiffness of right ankle, not elsewhere classified: Secondary | ICD-10-CM | POA: Diagnosis not present

## 2019-10-28 DIAGNOSIS — M62571 Muscle wasting and atrophy, not elsewhere classified, right ankle and foot: Secondary | ICD-10-CM | POA: Diagnosis not present

## 2019-11-01 DIAGNOSIS — M25474 Effusion, right foot: Secondary | ICD-10-CM | POA: Diagnosis not present

## 2019-11-01 DIAGNOSIS — M79671 Pain in right foot: Secondary | ICD-10-CM | POA: Diagnosis not present

## 2019-11-01 DIAGNOSIS — M25671 Stiffness of right ankle, not elsewhere classified: Secondary | ICD-10-CM | POA: Diagnosis not present

## 2019-11-01 DIAGNOSIS — R269 Unspecified abnormalities of gait and mobility: Secondary | ICD-10-CM | POA: Diagnosis not present

## 2019-11-01 DIAGNOSIS — M25674 Stiffness of right foot, not elsewhere classified: Secondary | ICD-10-CM | POA: Diagnosis not present

## 2019-11-01 DIAGNOSIS — M62571 Muscle wasting and atrophy, not elsewhere classified, right ankle and foot: Secondary | ICD-10-CM | POA: Diagnosis not present

## 2019-11-04 DIAGNOSIS — R269 Unspecified abnormalities of gait and mobility: Secondary | ICD-10-CM | POA: Diagnosis not present

## 2019-11-04 DIAGNOSIS — M62571 Muscle wasting and atrophy, not elsewhere classified, right ankle and foot: Secondary | ICD-10-CM | POA: Diagnosis not present

## 2019-11-04 DIAGNOSIS — M79671 Pain in right foot: Secondary | ICD-10-CM | POA: Diagnosis not present

## 2019-11-04 DIAGNOSIS — M25474 Effusion, right foot: Secondary | ICD-10-CM | POA: Diagnosis not present

## 2019-11-04 DIAGNOSIS — M25674 Stiffness of right foot, not elsewhere classified: Secondary | ICD-10-CM | POA: Diagnosis not present

## 2019-11-04 DIAGNOSIS — M25671 Stiffness of right ankle, not elsewhere classified: Secondary | ICD-10-CM | POA: Diagnosis not present

## 2019-11-05 DIAGNOSIS — R269 Unspecified abnormalities of gait and mobility: Secondary | ICD-10-CM | POA: Diagnosis not present

## 2019-11-05 DIAGNOSIS — M25474 Effusion, right foot: Secondary | ICD-10-CM | POA: Diagnosis not present

## 2019-11-05 DIAGNOSIS — M25671 Stiffness of right ankle, not elsewhere classified: Secondary | ICD-10-CM | POA: Diagnosis not present

## 2019-11-05 DIAGNOSIS — M62571 Muscle wasting and atrophy, not elsewhere classified, right ankle and foot: Secondary | ICD-10-CM | POA: Diagnosis not present

## 2019-11-05 DIAGNOSIS — M25674 Stiffness of right foot, not elsewhere classified: Secondary | ICD-10-CM | POA: Diagnosis not present

## 2019-11-05 DIAGNOSIS — M79671 Pain in right foot: Secondary | ICD-10-CM | POA: Diagnosis not present

## 2019-11-09 DIAGNOSIS — M25674 Stiffness of right foot, not elsewhere classified: Secondary | ICD-10-CM | POA: Diagnosis not present

## 2019-11-09 DIAGNOSIS — M62571 Muscle wasting and atrophy, not elsewhere classified, right ankle and foot: Secondary | ICD-10-CM | POA: Diagnosis not present

## 2019-11-09 DIAGNOSIS — M25671 Stiffness of right ankle, not elsewhere classified: Secondary | ICD-10-CM | POA: Diagnosis not present

## 2019-11-09 DIAGNOSIS — M79671 Pain in right foot: Secondary | ICD-10-CM | POA: Diagnosis not present

## 2019-11-09 DIAGNOSIS — M25474 Effusion, right foot: Secondary | ICD-10-CM | POA: Diagnosis not present

## 2019-11-09 DIAGNOSIS — R269 Unspecified abnormalities of gait and mobility: Secondary | ICD-10-CM | POA: Diagnosis not present

## 2019-11-22 ENCOUNTER — Ambulatory Visit (INDEPENDENT_AMBULATORY_CARE_PROVIDER_SITE_OTHER): Payer: Medicare PPO

## 2019-11-22 ENCOUNTER — Other Ambulatory Visit: Payer: Self-pay

## 2019-11-22 ENCOUNTER — Ambulatory Visit (INDEPENDENT_AMBULATORY_CARE_PROVIDER_SITE_OTHER): Payer: Medicare PPO | Admitting: Podiatry

## 2019-11-22 DIAGNOSIS — Z9889 Other specified postprocedural states: Secondary | ICD-10-CM

## 2019-11-22 DIAGNOSIS — M2041 Other hammer toe(s) (acquired), right foot: Secondary | ICD-10-CM

## 2019-11-22 DIAGNOSIS — M21619 Bunion of unspecified foot: Secondary | ICD-10-CM

## 2019-11-28 NOTE — Progress Notes (Signed)
   Subjective:  Patient presents today status post bunionectomy and hammertoe repair 2nd digit right. DOS: 09/16/2019. She states she is doing well overall. She reports diffuse swelling of the right foot. She has been using the post op shoe and doing physical therapy which have helped alleviate her symptoms. She denies any specific worsening factors. Patient is here for further evaluation and treatment.   Past Medical History:  Diagnosis Date  . Anxiety   . Cataract   . Colonic polyp 2005 & 2013  . DDD (degenerative disc disease)   . Depression   . GERD (gastroesophageal reflux disease)   . H/O echocardiogram    before 2000, no need for f/u /w cardiac   . Heart murmur    MVP- echo- 2010, no symptoms   . Memory disorder 09/19/2017  . Mononucleosis 1971  . Osteoporosis   . Pinched nerve in neck   . Sleep apnea       Objective/Physical Exam Neurovascular status intact.  Skin incisions appear to be well coapted. No sign of infectious process noted. No dehiscence. No active bleeding noted. Moderate edema noted to the surgical extremity.  Radiographic Exam:  Orthopedic hardware and osteotomies sites appear to be stable with routine healing.  Assessment: 1. s/p bunionectomy and hammertoe repair 2nd toe right. DOS: 09/16/2019   Plan of Care:  1. Patient was evaluated. X-Rays reviewed.  2. May resume full activity with no restrictions.  3. Recommended good shoe gear.  4. Return to clinic as needed.      Edrick Kins, DPM [facility]  Dr. Edrick Kins, [place]                                        [place], [place] [address]                Office [phone]  Fax [phone]

## 2020-02-03 NOTE — Telephone Encounter (Signed)
PA has been initiated via [email or web address] for Prolia 60mg  injection.  [facility]  Key: [id number]   PA Case ID: [id number] Status:Sent to Plan today Drug; Prolia 60MG /ML syringes Form: Nurse, adult and Medical Benefit PA Form

## 2020-02-14 NOTE — Telephone Encounter (Signed)
PA for Prolia has been approved by [employer].   [facility]  Key: [id number]   PA Case ID: [id number] Outcome: Approved on May 6 PA Case: [id number],  Status: Approved Coverage Starts on: 02/03/2020 12:00:00 AM,  Coverage Ends on: 09/29/2020 12:00:00 AM. Drug: Prolia 60MG /ML syringes Form: Nurse, adult and Medical Benefit PA Form.  Called insurance directly and spoke with representative named Legrand Como with Reference number for the call of [id number].  Patient has a $0 deductible for the medication and $0 deductible for this medication.  Administration fee is $40.   Called pt and left voicemail requesting a call back. Pt is ready to be scheduled with $40 payment due at time of checking in for injection.

## 2020-02-17 NOTE — Telephone Encounter (Signed)
Prolia scheduled for Tuesday March 07, 2020 @ 10:30 AM

## 2020-03-07 ENCOUNTER — Ambulatory Visit (INDEPENDENT_AMBULATORY_CARE_PROVIDER_SITE_OTHER): Payer: Medicare PPO

## 2020-03-07 ENCOUNTER — Other Ambulatory Visit: Payer: Self-pay

## 2020-03-07 DIAGNOSIS — M85859 Other specified disorders of bone density and structure, unspecified thigh: Secondary | ICD-10-CM | POA: Diagnosis not present

## 2020-03-07 DIAGNOSIS — E559 Vitamin D deficiency, unspecified: Secondary | ICD-10-CM

## 2020-03-07 MED ORDER — DENOSUMAB 60 MG/ML ~~LOC~~ SOSY
60.0000 mg | PREFILLED_SYRINGE | Freq: Once | SUBCUTANEOUS | Status: AC
  Administered 2020-03-07: 60 mg via SUBCUTANEOUS

## 2020-03-07 NOTE — Progress Notes (Signed)
After obtaining consent, and per orders of Dr. Cruzita Lederer, injection of Prolia 60 mg. given by Gwinda Maine. Patient instructed to remain in clinic for 20 minutes afterwards, and to report any adverse reaction to me immediately.

## 2020-05-22 DIAGNOSIS — Z1231 Encounter for screening mammogram for malignant neoplasm of breast: Secondary | ICD-10-CM | POA: Diagnosis not present

## 2020-06-01 DIAGNOSIS — H52223 Regular astigmatism, bilateral: Secondary | ICD-10-CM | POA: Diagnosis not present

## 2020-06-01 DIAGNOSIS — H524 Presbyopia: Secondary | ICD-10-CM | POA: Diagnosis not present

## 2020-06-01 DIAGNOSIS — H35363 Drusen (degenerative) of macula, bilateral: Secondary | ICD-10-CM | POA: Diagnosis not present

## 2020-06-01 DIAGNOSIS — Z961 Presence of intraocular lens: Secondary | ICD-10-CM | POA: Diagnosis not present

## 2020-06-06 ENCOUNTER — Encounter: Payer: Self-pay | Admitting: Obstetrics and Gynecology

## 2020-07-13 ENCOUNTER — Other Ambulatory Visit: Payer: Self-pay | Admitting: *Deleted

## 2020-07-13 NOTE — Telephone Encounter (Signed)
Patient returned call

## 2020-07-13 NOTE — Telephone Encounter (Signed)
Spoke with patient.   Patient states she does not need a refill of escitalopram at this time, has enough until her AEX.  Patient will need a refill of bupropion 300 mg 24 hr tab. She request a 90 day supply to mail order to be cost prohibitive. Patient is aware to keep AEX for future refills.  Advised I will review request with Dr. Quincy Simmonds and f/u, patient agreeable.   Rx pended for bupropion 300 mg 24 hr tab, #90/0RF.   Dr. Quincy Simmonds -please review and advise on refill.

## 2020-07-13 NOTE — Telephone Encounter (Signed)
Rachel Moulds calling from [facility].  Requesting to F/u on fax refill request for bupropion 300 mg 24 hr tab and escitalopram 10 mg tab faxed one week ago. Request 90 day supply. No refill request on file. Advised I will review request with provider and send Rx if appropriate.    Last AEX 08/23/19 Next 08/28/20  Rx for bupropion 300 mg 24 hr tab and escitalopram 10 mg tab last sent 08/23/19 for #90/3RF  Call placed to patient, Left message to call Sharee Pimple, RN at [facility].

## 2020-07-14 MED ORDER — BUPROPION HCL ER (XL) 300 MG PO TB24: 300.0000 mg | ORAL_TABLET | Freq: Every day | ORAL | 0 refills | Status: DC

## 2020-07-14 NOTE — Telephone Encounter (Signed)
Patient notified of refill.

## 2020-08-09 DIAGNOSIS — G4733 Obstructive sleep apnea (adult) (pediatric): Secondary | ICD-10-CM | POA: Diagnosis not present

## 2020-08-28 ENCOUNTER — Other Ambulatory Visit: Payer: Self-pay

## 2020-08-28 ENCOUNTER — Ambulatory Visit (INDEPENDENT_AMBULATORY_CARE_PROVIDER_SITE_OTHER): Payer: Medicare PPO | Admitting: Obstetrics and Gynecology

## 2020-08-28 ENCOUNTER — Other Ambulatory Visit (HOSPITAL_COMMUNITY)
Admission: RE | Admit: 2020-08-28 | Discharge: 2020-08-28 | Disposition: A | Discharge status: Home / Self Care | Payer: Medicare PPO | Source: Ambulatory Visit | Attending: Obstetrics and Gynecology | Admitting: Obstetrics and Gynecology

## 2020-08-28 ENCOUNTER — Encounter: Payer: Self-pay | Admitting: Obstetrics and Gynecology

## 2020-08-28 VITALS — BP 118/60 | HR 68 | Resp 10 | Ht <= 58 in | Wt 131.0 lb

## 2020-08-28 DIAGNOSIS — Z01419 Encounter for gynecological examination (general) (routine) without abnormal findings: Secondary | ICD-10-CM

## 2020-08-28 MED ORDER — ESCITALOPRAM OXALATE 10 MG PO TABS: 10.0000 mg | ORAL_TABLET | Freq: Every day | ORAL | 3 refills | Status: DC

## 2020-08-28 MED ORDER — BUPROPION HCL ER (XL) 300 MG PO TB24: 300.0000 mg | ORAL_TABLET | Freq: Every day | ORAL | 3 refills | Status: DC

## 2020-08-28 NOTE — Patient Instructions (Signed)

## 2020-08-28 NOTE — Progress Notes (Signed)
69 y.o. G76P2002 Married Caucasian female here for annual exam.  Pt denies any new concerns.   Taking Prolia for her worsening osteopenia.  Will receive her next injection in December. Dr. Cruzita Lederer managing.   Taking vit D every other day.   Taking Wellbutrin and Lexapro and doing well on them.   Received her Covid booster and flu vaccine.   PCP:   Jessica G. [name], MD  Patient's last menstrual period was 10/01/1995 (approximate).           Sexually active: Yes.    The current method of family planning is post menopausal status.    Exercising: Yes.    walking 3 days a week.  Smoker:  no  Health Maintenance: Pap:  07/28/18 Neg  07/05/16 Neg  05/14/13 Neg:Neg HR HPV History of abnormal Pap:  no MMG:  05/22/20 BIRADS 1 negative/density b Colonoscopy:  07/17/17 Polyps; f/u 5 years BMD:   08/23/19 -   Result  Osteopenia TDaP:  2020 Gardasil:   n/a HIV: never Hep C: 06/11/19 Negative Screening Labs: PCP   reports that she has never smoked. She has never used smokeless tobacco. She reports that she does not drink alcohol and does not use drugs.  Past Medical History:  Diagnosis Date  . Anxiety   . Cataract   . Colonic polyp 2005 & 2013  . DDD (degenerative disc disease)   . Depression   . GERD (gastroesophageal reflux disease)   . H/O echocardiogram    before 2000, no need for f/u /w cardiac   . Heart murmur    MVP- echo- 2010, no symptoms   . Memory disorder 09/19/2017  . Mononucleosis 1971  . Osteoporosis   . Pinched nerve in neck   . Sleep apnea     Past Surgical History:  Procedure Laterality Date  . ANTERIOR CERVICAL DECOMP/DISCECTOMY FUSION N/A 05/20/2013   Procedure: ANTERIOR CERVICAL DECOMPRESSION/DISCECTOMY FUSION 2 LEVELS;  Surgeon: Erline Levine, MD;  Location: [facility];  Service: Neurosurgery;  Laterality: N/A;  Cervical Seven-Thoracic One, Thoracic One-Two Anterior cervical decompression/Diskectomy/Fusion  . CARPAL TUNNEL RELEASE Right   . CATARACT  EXTRACTION, BILATERAL  2013   Dr Gershon Crane, Viona Gilmore IOL  . CERVICAL FUSION     2 proceduresr Vertell Limber  . colonoscopy with polypectomy  2013   Dr Deatra Ina  . FRACTURE SURGERY     L wrist - June 2014  . HERNIA REPAIR    . KNEE SURGERY     Dr Wonda Olds ; bursa cystectomy  . LUMBAR DISC SURGERY     Dr.Stern  . ROTATOR CUFF REPAIR     Dr.Wainer  . UMBILICAL HERNIA REPAIR    . WRIST FRACTURE SURGERY Left 02/2013   Dr. Kathryne Hitch    Current Outpatient Medications  Medication Sig Dispense Refill  . Acetaminophen (TYLENOL PO) Take by mouth.    . Biotin 300 MCG TABS Take 300 mcg by mouth daily.      Marland Kitchen buPROPion (WELLBUTRIN XL) 300 MG 24 hr tablet Take 1 tablet (300 mg total) by mouth daily. 90 tablet 3  . Calcium Carb-Cholecalciferol (CALCIUM 1000 + D PO) Take by mouth daily.    . Cholecalciferol (VITAMIN D) 2000 UNITS CAPS Take by mouth.    . diclofenac sodium (VOLTAREN) 1 % GEL Apply 2 g topically 4 (four) times daily. 3 Tube 3  . escitalopram (LEXAPRO) 10 MG tablet Take 1 tablet (10 mg total) by mouth daily. 90 tablet 3  . Vaginal Lubricant (  REPLENS VA) Place vaginally every 3 (three) days.    . Multiple Vitamin (MULTI-VITAMIN DAILY PO) Take by mouth daily.    Marland Kitchen oxyCODONE-acetaminophen (PERCOCET) 5-325 MG tablet Take 1 tablet by mouth every 6 (six) hours as needed for severe pain. 30 tablet 0   No current facility-administered medications for this visit.    Family History  Problem Relation Age of Onset  . Diabetes Father   . Coronary artery disease Father        S/P CBAG , no MI -- Dec  . Depression Father   . Diverticulosis Father   . Breast cancer Mother        estorgen receptor positive  . Stroke Mother 44       Dec  . Breast cancer Sister        estrogen receptor positive  . Depression Sister   . Depression Sister   . Depression Sister   . Depression Sister   . Depression Brother   . Bipolar disorder Other        Neice  . Colon polyps Sister   . Colon cancer Neg Hx   .  Esophageal cancer Neg Hx   . Pancreatic cancer Neg Hx   . Rectal cancer Neg Hx   . Stomach cancer Neg Hx     Review of Systems  All other systems reviewed and are negative.   Exam:   BP 118/60   Pulse 68   Resp 10   Ht 4\' 10"  (1.473 m)   Wt 131 lb (59.4 kg)   LMP 10/01/1995 (Approximate)   SpO2 99%   BMI 27.38 kg/m     General appearance: alert, cooperative and appears stated age Head: normocephalic, without obvious abnormality, atraumatic Neck: no adenopathy, supple, symmetrical, trachea midline and thyroid normal to inspection and palpation Lungs: clear to auscultation bilaterally Breasts: normal appearance, no masses or tenderness, No nipple retraction or dimpling, No nipple discharge or bleeding, No axillary adenopathy Heart: regular rate and rhythm Abdomen: soft, non-tender; no masses, no organomegaly Extremities: extremities normal, atraumatic, no cyanosis or edema Skin: skin color, texture, turgor normal. No rashes or lesions Lymph nodes: cervical, supraclavicular, and axillary nodes normal. Neurologic: grossly normal  Pelvic: External genitalia:  no lesions              No abnormal inguinal nodes palpated.              Urethra:  normal appearing urethra with no masses, tenderness or lesions              Bartholins and Skenes: normal                 Vagina: normal appearing vagina with normal color and discharge, no lesions              Cervix: no lesions              Pap taken: Yes.   Bimanual Exam:  Uterus:  normal size, contour, position, consistency, mobility, non-tender              Adnexa: no mass, fullness, tenderness              Rectal exam: Yes.  .  Confirms.              Anus:  normal sphincter tone, no lesions  Chaperone was present for exam.  Assessment:   Well woman visit with normal exam. Recurrent UTI.  Osteopenia.  On Fosamax for  8 years.  Hx fracture of wrist. Hx anxiety and depression.  FH breast cancer in mother and sister.    Plan: Mammogram screening discussed. Self breast awareness reviewed. Pap and HR HPV as above. Guidelines for Calcium, Vitamin D, regular exercise program including cardiovascular and weight bearing exercise.   Follow up annually and prn.

## 2020-08-30 LAB — CYTOLOGY - PAP: Diagnosis: NEGATIVE

## 2020-09-11 ENCOUNTER — Ambulatory Visit: Payer: Medicare PPO | Admitting: Internal Medicine

## 2020-09-11 DIAGNOSIS — Z20822 Contact with and (suspected) exposure to covid-19: Secondary | ICD-10-CM | POA: Diagnosis not present

## 2020-10-02 ENCOUNTER — Ambulatory Visit: Payer: Medicare PPO | Admitting: Internal Medicine

## 2020-10-02 NOTE — Progress Notes (Deleted)
Patient ID: Jessica Casey, female   DOB: Jun 01, 1951, 70 y.o.   MRN: [id number]   This visit occurred during the SARS-CoV-2 public health emergency.  Safety protocols were in place, including screening questions prior to the visit, additional usage of staff PPE, and extensive cleaning of exam room while observing appropriate contact time as indicated for disinfecting solutions.   HPI  Jessica Casey is a 70 y.o.-year-old female, initially referred by her OB/GYN doctor, Dr. Judeth Casey, returning for follow-up for osteopenia (Op) on DXA scans, but with clinical osteoporosis.  She was diagnosed with osteoporosis in 2012, then osteopenia in 2016.  Reviewed previous DXA scan reports: Date L1-L4 T score FN T score 33% distal Radius  08/23/2019 ([facility]) n/a RFN: -1.9 LFN: -2.0 -1.4  08/12/2017 ([facility]) n/a RFN: -1.6 LFN: -1.6  -1.6  08/07/2015 ([facility]) n/a RFN: -1.9 LFN: -1.8  -0.7  05/07/2011 ([facility]) n/a RFN: -2.3 LFN: -2.5  -0.9   No history of frequent falls.  No dizziness/vertigo/orthostasis/poor vision.   + History of fracture: - wrist in 2015 (lost balance on concrete)  Osteoporosis treatments:  - Fosamax 70 mg weekly  - started 2012 - Prolia - started 03/07/2020  She tolerates Prolia well, without jaw, hip, thigh pain.  No dental work in progress of coming up.  She has a history of vitamin D deficiency.  Reviewed available vitamin D levels: Lab Results  Component Value Date   VD25OH 92.12 09/13/2019   VD25OH 79.96 02/10/2019   VD25OH 61 05/16/2014   VD25OH 28 (L) 11/01/2010   VD25OH 30 08/02/2009   Pt is on calcium and vitamin D: Vitamin D 2000 units + multivitamin + calcium-vitamin D supplement  She is walking for exercise-1.65 miles 3-4 times a week.  She has a h/o 3 back surgeries - cervical area and also spinal fusion.  She continues to have back pain  She does not take high vitamin A doses.  Menopause was at 70 y/o. She was on HRT x 2 years. However, BrCA in sister and mother  (Estrogen R +) >> she was taken off of HRT.  FH of osteoporosis: sister - Op.  No history of kidney stones, hyper or hypocalcemia, hyperparathyroidism Lab Results  Component Value Date   CALCIUM 9.2 09/13/2019   CALCIUM 9.3 02/10/2019   CALCIUM 9.4 08/06/2016   CALCIUM 9.4 02/28/2016   CALCIUM 9.6 08/08/2015   CALCIUM 9.0 05/16/2014   CALCIUM 9.6 05/13/2013   CALCIUM 8.9 02/17/2013   CALCIUM 7.8 (L) 06/28/2011   CALCIUM 9.2 11/01/2010   No history of thyrotoxicosis: Lab Results  Component Value Date   TSH 2.04 08/06/2016   TSH 1.28 08/08/2015   TSH 2.109 05/16/2014   TSH 1.41 02/17/2013   TSH 1.57 11/01/2010   She is on biotin.  No history of CKD, but latest BUN/creatinine: Lab Results  Component Value Date   BUN 17 09/13/2019   CREATININE 1.11 (H) 09/13/2019   She has OA.   ROS: Constitutional: no weight gain/no weight loss, no fatigue, no subjective hyperthermia, no subjective hypothermia Eyes: no blurry vision, no xerophthalmia ENT: no sore throat, no nodules palpated in neck, no dysphagia, no odynophagia, no hoarseness Cardiovascular: no CP/no SOB/no palpitations/no leg swelling Respiratory: no cough/no SOB/no wheezing Gastrointestinal: no N/no V/no D/no C/no acid reflux Musculoskeletal: no muscle aches/+ joint aches (osteoarthritis) Skin: no rashes, no hair loss Neurological: no tremors/no numbness/no tingling/no dizziness  I reviewed pt's medications, allergies, PMH, social hx, family hx, and changes were documented  in the history of present illness. Otherwise, unchanged from my initial visit note.  Past Medical History:  Diagnosis Date  . Anxiety   . Cataract   . Colonic polyp 2005 & 2013  . DDD (degenerative disc disease)   . Depression   . GERD (gastroesophageal reflux disease)   . H/O echocardiogram    before 2000, no need for f/u /w cardiac   . Heart murmur    MVP- echo- 2010, no symptoms   . Memory disorder 09/19/2017  . Mononucleosis 1971   . Osteoporosis   . Pinched nerve in neck   . Sleep apnea    Past Surgical History:  Procedure Laterality Date  . ANTERIOR CERVICAL DECOMP/DISCECTOMY FUSION N/A 05/20/2013   Procedure: ANTERIOR CERVICAL DECOMPRESSION/DISCECTOMY FUSION 2 LEVELS;  Surgeon: Jessica Levine, MD;  Location: [facility];  Service: Neurosurgery;  Laterality: N/A;  Cervical Seven-Thoracic One, Thoracic One-Two Anterior cervical decompression/Diskectomy/Fusion  . CARPAL TUNNEL RELEASE Right   . CATARACT EXTRACTION, BILATERAL  2013   Dr Jessica Casey, Viona Gilmore IOL  . CERVICAL FUSION     2 proceduresr Jessica Casey  . colonoscopy with polypectomy  2013   Dr Jessica Casey  . FRACTURE SURGERY     L wrist - June 2014  . HERNIA REPAIR    . KNEE SURGERY     Dr Jessica Casey ; bursa cystectomy  . LUMBAR DISC SURGERY     Dr.Stern  . ROTATOR CUFF REPAIR     Dr.Wainer  . UMBILICAL HERNIA REPAIR    . WRIST FRACTURE SURGERY Left 02/2013   Dr. Kathryne Casey   Social History   Socioeconomic History  . Marital status: Married    Spouse name: Not on file  . Number of children: 2  . Years of education: 16+  . Highest education level: Not on file  Occupational History  . Occupation: Music therapist- Retired  Tobacco Use  . Smoking status: Never Smoker  . Smokeless tobacco: Never Used  Substance and Sexual Activity  . Alcohol use: No    Alcohol/week: 0.0 standard drinks  . Drug use: No  . Sexual activity: Not Currently    Partners: Male    Birth control/protection: Post-menopausal  Other Topics Concern  . Not on file  Social History Narrative   Lives   Caffeine use:    Right handed    Social Determinants of Health   Financial Resource Strain:   . Difficulty of Paying Living Expenses: Not on file  Food Insecurity:   . Worried About Charity fundraiser in the Last Year: Not on file  . Ran Out of Food in the Last Year: Not on file  Transportation Needs:   . Lack of Transportation (Medical): Not on file  . Lack of Transportation  (Non-Medical): Not on file  Physical Activity:   . Days of Exercise per Week: Not on file  . Minutes of Exercise per Session: Not on file  Stress:   . Feeling of Stress : Not on file  Social Connections:   . Frequency of Communication with Friends and Family: Not on file  . Frequency of Social Gatherings with Friends and Family: Not on file  . Attends Religious Services: Not on file  . Active Member of Clubs or Organizations: Not on file  . Attends Archivist Meetings: Not on file  . Marital Status: Not on file  Intimate Partner Violence:   . Fear of Current or Ex-Partner: Not on file  . Emotionally Abused: Not  on file  . Physically Abused: Not on file  . Sexually Abused: Not on file   Current Outpatient Medications on File Prior to Visit  Medication Sig Dispense Refill  . Acetaminophen (TYLENOL PO) Take by mouth.    . Biotin 300 MCG TABS Take 300 mcg by mouth daily.      Marland Kitchen buPROPion (WELLBUTRIN XL) 300 MG 24 hr tablet Take 1 tablet (300 mg total) by mouth daily. 90 tablet 3  . Calcium Carb-Cholecalciferol (CALCIUM 1000 + D PO) Take by mouth daily.    . Cholecalciferol (VITAMIN D) 2000 UNITS CAPS Take by mouth.    . diclofenac sodium (VOLTAREN) 1 % GEL Apply 2 g topically 4 (four) times daily. 3 Tube 3  . escitalopram (LEXAPRO) 10 MG tablet Take 1 tablet (10 mg total) by mouth daily. 90 tablet 3  . Multiple Vitamin (MULTI-VITAMIN DAILY PO) Take by mouth daily.    Marland Kitchen oxyCODONE-acetaminophen (PERCOCET) 5-325 MG tablet Take 1 tablet by mouth every 6 (six) hours as needed for severe pain. 30 tablet 0  . Vaginal Lubricant (REPLENS VA) Place vaginally every 3 (three) days.     No current facility-administered medications on file prior to visit.   Allergies  Allergen Reactions  . Gabapentin     REACTION: TOUNGE SWELLING, ITCHING   Family History  Problem Relation Age of Onset  . Diabetes Father   . Coronary artery disease Father        S/P CBAG , no MI -- Dec  .  Depression Father   . Diverticulosis Father   . Breast cancer Mother        estorgen receptor positive  . Stroke Mother 77       Dec  . Breast cancer Sister        estrogen receptor positive  . Depression Sister   . Depression Sister   . Depression Sister   . Depression Sister   . Depression Brother   . Bipolar disorder Other        Neice  . Colon polyps Sister   . Colon cancer Neg Hx   . Esophageal cancer Neg Hx   . Pancreatic cancer Neg Hx   . Rectal cancer Neg Hx   . Stomach cancer Neg Hx     PE: LMP 10/01/1995 (Approximate)  Wt Readings from Last 3 Encounters:  08/28/20 131 lb (59.4 kg)  09/13/19 130 lb (59 kg)  08/23/19 126 lb 9.6 oz (57.4 kg)   Constitutional: normal weight, in NAD Eyes: PERRLA, EOMI, no exophthalmos ENT: moist mucous membranes, no thyromegaly, no cervical lymphadenopathy Cardiovascular: RRR, No MRG Respiratory: CTA B Gastrointestinal: abdomen soft, NT, ND, BS+ Musculoskeletal: no deformities, strength intact in all 4 Skin: moist, warm, no rashes Neurological: no tremor with outstretched hands, DTR normal in all 4  Assessment: 1. Osteoporosis  2.  Vitamin D deficiency  Plan: 1. Osteoporosis -Likely postmenopausal LADA she had early menopause) and age-related, she also has family history of osteopenia in sister -We reviewed her previous bone density scans: Before starting Prolia, her femoral neck T-scores worsened.  She also has a history of fragility fracture in 2015.  Therefore, overall, she was at increased risk for fracture due to clinical osteoporosis. -At last visit, we continued her vitamin D supplementation and I advised her to make sure she was getting 1000 to 1200 mg of calcium daily.  We also discussed fall precautions. -She was previously on Fosamax but after last visit, we started Prolia on  03/07/2020. -She tolerates Prolia well, without jaw pain, hip pain, thigh pain -For now, we will continue Prolia injections  -we discussed that  we can continue up to 10 years if needed -At this visit, I again recommended weightbearing exercises -We will check the following labs today: BMP, vitamin D - will see pt back in 1 year and at that time we will order the new bone density scan  2.  Vitamin D deficiency -She has a history of vitamin D deficiency and has been on supplementation for many years; currently on at least 3000 units daily -Latest level was normal in 01/2019 -We will recheck this today  Philemon Kingdom, MD PhD [facility] Endocrinology

## 2020-10-03 ENCOUNTER — Encounter: Payer: Self-pay | Admitting: Internal Medicine

## 2020-10-03 ENCOUNTER — Telehealth: Payer: Self-pay

## 2020-10-03 ENCOUNTER — Ambulatory Visit: Payer: Medicare PPO | Admitting: Internal Medicine

## 2020-10-03 ENCOUNTER — Other Ambulatory Visit: Payer: Self-pay

## 2020-10-03 VITALS — BP 130/80 | HR 71 | Ht 59.0 in | Wt 133.4 lb

## 2020-10-03 DIAGNOSIS — E559 Vitamin D deficiency, unspecified: Secondary | ICD-10-CM | POA: Diagnosis not present

## 2020-10-03 DIAGNOSIS — M810 Age-related osteoporosis without current pathological fracture: Secondary | ICD-10-CM

## 2020-10-03 DIAGNOSIS — Z03818 Encounter for observation for suspected exposure to other biological agents ruled out: Secondary | ICD-10-CM | POA: Diagnosis not present

## 2020-10-03 DIAGNOSIS — M85859 Other specified disorders of bone density and structure, unspecified thigh: Secondary | ICD-10-CM

## 2020-10-03 LAB — BASIC METABOLIC PANEL
BUN: 19 mg/dL (ref 6–23)
CO2: 29 mEq/L (ref 19–32)
Calcium: 9 mg/dL (ref 8.4–10.5)
Chloride: 107 mEq/L (ref 96–112)
Creatinine, Ser: 1.03 mg/dL (ref 0.40–1.20)
GFR: 55.08 mL/min — ABNORMAL LOW (ref >60.00)
Glucose, Bld: 94 mg/dL (ref 70–99)
Potassium: 3.6 mEq/L (ref 3.5–5.1)
Sodium: 141 mEq/L (ref 135–145)

## 2020-10-03 NOTE — Patient Instructions (Addendum)
Please stop at the lab.  Continue Prolia for now.  Please come back for a follow-up appointment in 1 year.

## 2020-10-03 NOTE — Telephone Encounter (Signed)
Jessica Casey, I change the diagnosis (she does have daily bowel osteoporosis) and we should resubmit ASAP so we can give her the injection. Ty, C

## 2020-10-03 NOTE — Progress Notes (Signed)
Patient ID: Jessica Casey, female   DOB: Jun 18, 1951, 70 y.o.   MRN: [id number]   This visit occurred during the SARS-CoV-2 public health emergency.  Safety protocols were in place, including screening questions prior to the visit, additional usage of staff PPE, and extensive cleaning of exam room while observing appropriate contact time as indicated for disinfecting solutions.   HPI  Jessica Casey is a 70 y.o.-year-old female, initially referred by her OB/GYN doctor, Dr. Judeth Horn, returning for follow-up for osteopenia (Op) on DXA scans, but with clinical osteoporosis.  Last visit 1 year ago.  She was diagnosed with osteoporosis in 2012, then osteopenia in 2016.  However, due to history of fracture, she has clinical osteoporosis.  Reviewed previous DXA scan words: Date L1-L4 T score FN T score 33% distal Radius  08/23/2019 ([facility]) n/a RFN: -1.9 LFN: -2.0 -1.4  08/12/2017 ([facility]) n/a RFN: -1.6 LFN: -1.6  -1.6  08/07/2015 ([facility]) n/a RFN: -1.9 LFN: -1.8  -0.7  05/07/2011 ([facility]) n/a RFN: -2.3 LFN: -2.5  -0.9   No history of frequent falls.  She denies dzziness/vertigo/orthostasis/poor vision.   She has a history of: - wrist in 2015 (lost balance on concrete)  Osteoporosis treatments:  - Fosamax 70 mg weekly  - started 2012 - Prolia-started 03/07/2020  She tolerates Prolia well, without jaw, hip, thigh pain.  No dental work in progress  She has a history of vitamin D deficiency: Lab Results  Component Value Date   VD25OH 92.12 09/13/2019   VD25OH 79.96 02/10/2019   VD25OH 61 05/16/2014   VD25OH 28 (L) 11/01/2010   VD25OH 30 08/02/2009   She is on calcium and vitamin D: Vitamin D 2000 units  Qod now (decreased at last OV) + multivitamin + calcium-vitamin D.  She is walking for exercise 1.65 miles 3-4 times a week.  She has a h/o 3 back surgeries - cervical area and also spinal fusion.  She continues to have back pain.  She does not take high vitamin A doses.  Menopause was at  70 years old.  She was on HRT for 2 years. However, BrCA in sister and mother (Estrogen R +) >> she was taken off HRT.  FH of osteoporosis: sister - Op.  No history of kidney stones, persistent hyper or hypocalcemia: Lab Results  Component Value Date   CALCIUM 9.2 09/13/2019   CALCIUM 9.3 02/10/2019   CALCIUM 9.4 08/06/2016   CALCIUM 9.4 02/28/2016   CALCIUM 9.6 08/08/2015   CALCIUM 9.0 05/16/2014   CALCIUM 9.6 05/13/2013   CALCIUM 8.9 02/17/2013   CALCIUM 7.8 (L) 06/28/2011   CALCIUM 9.2 11/01/2010   No history of thyrotoxicosis: Lab Results  Component Value Date   TSH 2.04 08/06/2016   TSH 1.28 08/08/2015   TSH 2.109 05/16/2014   TSH 1.41 02/17/2013   TSH 1.57 11/01/2010   She is on biotin.  No history of CKD, but latest BUN/creatinine: Lab Results  Component Value Date   BUN 17 09/13/2019   CREATININE 1.11 (H) 09/13/2019   She has OA.  ROS: Constitutional: no weight gain/no weight loss, no fatigue, no subjective hyperthermia, no subjective hypothermia Eyes: no blurry vision, no xerophthalmia ENT: no sore throat, no nodules palpated in neck, no dysphagia, no odynophagia, no hoarseness Cardiovascular: no CP/no SOB/no palpitations/no leg swelling Respiratory: no cough/no SOB/no wheezing Gastrointestinal: no N/no V/no D/no C/no acid reflux Musculoskeletal: no muscle aches/+ joint aches (osteoarthritis) Skin: no rashes, no hair loss Neurological: no tremors/no numbness/no tingling/no dizziness  I reviewed pt's medications, allergies, PMH, social hx, family hx, and changes were documented in the history of present illness. Otherwise, unchanged from my initial visit note.  Past Medical History:  Diagnosis Date  . Anxiety   . Cataract   . Colonic polyp 2005 & 2013  . DDD (degenerative disc disease)   . Depression   . GERD (gastroesophageal reflux disease)   . H/O echocardiogram    before 2000, no need for f/u /w cardiac   . Heart murmur    MVP- echo- 2010, no  symptoms   . Memory disorder 09/19/2017  . Mononucleosis 1971  . Osteoporosis   . Pinched nerve in neck   . Sleep apnea    Past Surgical History:  Procedure Laterality Date  . ANTERIOR CERVICAL DECOMP/DISCECTOMY FUSION N/A 05/20/2013   Procedure: ANTERIOR CERVICAL DECOMPRESSION/DISCECTOMY FUSION 2 LEVELS;  Surgeon: Erline Levine, MD;  Location: [place] [facility];  Service: Neurosurgery;  Laterality: N/A;  Cervical Seven-Thoracic One, Thoracic One-Two Anterior cervical decompression/Diskectomy/Fusion  . CARPAL TUNNEL RELEASE Right   . CATARACT EXTRACTION, BILATERAL  2013   Dr Gershon Crane, Viona Gilmore IOL  . CERVICAL FUSION     2 proceduresr Vertell Limber  . colonoscopy with polypectomy  2013   Dr Deatra Ina  . FRACTURE SURGERY     L wrist - June 2014  . HERNIA REPAIR    . KNEE SURGERY     Dr Wonda Olds ; bursa cystectomy  . LUMBAR DISC SURGERY     Dr.Stern  . ROTATOR CUFF REPAIR     Dr.Wainer  . UMBILICAL HERNIA REPAIR    . WRIST FRACTURE SURGERY Left 02/2013   Dr. Kathryne Hitch   Social History   Socioeconomic History  . Marital status: Married    Spouse name: Not on file  . Number of children: 2  . Years of education: 16+  . Highest education level: Not on file  Occupational History  . Occupation: Music therapist- Retired  Tobacco Use  . Smoking status: Never Smoker  . Smokeless tobacco: Never Used  Substance and Sexual Activity  . Alcohol use: No    Alcohol/week: 0.0 standard drinks  . Drug use: No  . Sexual activity: Not Currently    Partners: Male    Birth control/protection: Post-menopausal  Other Topics Concern  . Not on file  Social History Narrative   Lives   Caffeine use:    Right handed    Social Determinants of Health   Financial Resource Strain:   . Difficulty of Paying Living Expenses: Not on file  Food Insecurity:   . Worried About Charity fundraiser in the Last Year: Not on file  . Ran Out of Food in the Last Year: Not on file  Transportation Needs:   . Lack of  Transportation (Medical): Not on file  . Lack of Transportation (Non-Medical): Not on file  Physical Activity:   . Days of Exercise per Week: Not on file  . Minutes of Exercise per Session: Not on file  Stress:   . Feeling of Stress : Not on file  Social Connections:   . Frequency of Communication with Friends and Family: Not on file  . Frequency of Social Gatherings with Friends and Family: Not on file  . Attends Religious Services: Not on file  . Active Member of Clubs or Organizations: Not on file  . Attends Archivist Meetings: Not on file  . Marital Status: Not on file  Intimate Partner Violence:   .  Fear of Current or Ex-Partner: Not on file  . Emotionally Abused: Not on file  . Physically Abused: Not on file  . Sexually Abused: Not on file   Current Outpatient Medications on File Prior to Visit  Medication Sig Dispense Refill  . Acetaminophen (TYLENOL PO) Take by mouth.    . Biotin 300 MCG TABS Take 300 mcg by mouth daily.      Marland Kitchen buPROPion (WELLBUTRIN XL) 300 MG 24 hr tablet Take 1 tablet (300 mg total) by mouth daily. 90 tablet 3  . Calcium Carb-Cholecalciferol (CALCIUM 1000 + D PO) Take by mouth daily.    . Cholecalciferol (VITAMIN D) 2000 UNITS CAPS Take by mouth.    . diclofenac sodium (VOLTAREN) 1 % GEL Apply 2 g topically 4 (four) times daily. 3 Tube 3  . escitalopram (LEXAPRO) 10 MG tablet Take 1 tablet (10 mg total) by mouth daily. 90 tablet 3  . Multiple Vitamin (MULTI-VITAMIN DAILY PO) Take by mouth daily.    Marland Kitchen oxyCODONE-acetaminophen (PERCOCET) 5-325 MG tablet Take 1 tablet by mouth every 6 (six) hours as needed for severe pain. 30 tablet 0  . Vaginal Lubricant (REPLENS [facility]) Place vaginally every 3 (three) days.     No current facility-administered medications on file prior to visit.   Allergies  Allergen Reactions  . Gabapentin     REACTION: TOUNGE SWELLING, ITCHING   Family History  Problem Relation Age of Onset  . Diabetes Father   . Coronary  artery disease Father        S/P CBAG , no MI -- Dec  . Depression Father   . Diverticulosis Father   . Breast cancer Mother        estorgen receptor positive  . Stroke Mother 4       Dec  . Breast cancer Sister        estrogen receptor positive  . Depression Sister   . Depression Sister   . Depression Sister   . Depression Sister   . Depression Brother   . Bipolar disorder Other        Neice  . Colon polyps Sister   . Colon cancer Neg Hx   . Esophageal cancer Neg Hx   . Pancreatic cancer Neg Hx   . Rectal cancer Neg Hx   . Stomach cancer Neg Hx     PE: BP 130/80   Pulse 71   Ht 4' 11"  (1.499 m)   Wt 133 lb 6.4 oz (60.5 kg)   LMP 10/01/1995 (Approximate)   SpO2 97%   BMI 26.94 kg/m  Wt Readings from Last 3 Encounters:  10/03/20 133 lb 6.4 oz (60.5 kg)  08/28/20 131 lb (59.4 kg)  09/13/19 130 lb (59 kg)   Constitutional: normal weight, in NAD Eyes: PERRLA, EOMI, no exophthalmos ENT: moist mucous membranes, no thyromegaly, no cervical lymphadenopathy Cardiovascular: RRR, No MRG Respiratory: CTA B Gastrointestinal: abdomen soft, NT, ND, BS+ Musculoskeletal: no deformities, strength intact in all 4 Skin: moist, warm, no rashes Neurological: no tremor with outstretched hands, DTR normal in all 4  Assessment: 1. Clinical Osteoporosis  2.  Vitamin D deficiency  Plan: 1. Osteoporosis -Likely postmenopausal-she had early menopause and age-related; she also has family history of osteopenia and states that -We reviewed her previous bone density scans: Before starting Prolia, her femoral neck T-scores worsened.  She also had a fragility fracture in 2015.  Therefore, overall, she may be considered to have clinical osteoporosis and is at an  increased risk for fracture. -At last visit, we continued her vitamin D supplementation and I advised her to make sure she was getting 1000-1200 mg of calcium daily.  We also discussed fall precautions. -She was previously on Fosamax  but after last visit, we started Prolia on 03/07/2020 -She tolerates Prolia well, without jaw pain, hip or thigh pain -For now we will continue Prolia and we can continue to 10 years, if needed - will give a new injection within the next 2 weeks -At this visit, I again recommended weightbearing exercises -Today we will check a BMP and a vitamin D - I will see her back in a year and at that time we will order a new bone density scan  2.  Vitamin D deficiency -She has a history of vitamin D deficiency and has been on supplementation for many years, currently on at least 3000 units daily -Vitamin D level was last normal in 01/2019 -We will recheck this today  Component     Latest Ref Rng & Units 10/03/2020  Sodium     135 - 145 mEq/L 141  Potassium     3.5 - 5.1 mEq/L 3.6  Chloride     96 - 112 mEq/L 107  CO2     19 - 32 mEq/L 29  Glucose     70 - 99 mg/dL 94  BUN     6 - 23 mg/dL 19  Creatinine     0.40 - 1.20 mg/dL 1.03  Calcium     8.4 - 10.5 mg/dL 9.0  GFR     >60.00 mL/min 55.08 (L)  VITD     30.00 - 100.00 ng/mL 76.41    Philemon Kingdom, MD PhD [facility]

## 2020-10-03 NOTE — Telephone Encounter (Signed)
Patient as submitted to the [employer] portal today but she does not have a Dx of osteoporosis so this may not be approved by the insurance I did speak with Tileshia at [facility] and made her aware that a Dx of M81.0 is required for patients to get approved for Prolia- I can resubmit this if Dx gets changed

## 2020-10-04 LAB — VITAMIN D 25 HYDROXY (VIT D DEFICIENCY, FRACTURES): VITD: 76.41 ng/mL (ref 30.00–100.00)

## 2020-10-04 NOTE — Telephone Encounter (Signed)
I resubmitted the patient to [employer] portal today for insurance verification using the M81.0 Dx-I will contact pt for scheduling as soon as it comes back

## 2020-10-12 ENCOUNTER — Encounter: Payer: Self-pay | Admitting: Family Medicine

## 2020-10-13 ENCOUNTER — Telehealth (INDEPENDENT_AMBULATORY_CARE_PROVIDER_SITE_OTHER): Payer: Medicare PPO | Admitting: Family Medicine

## 2020-10-13 ENCOUNTER — Encounter: Payer: Self-pay | Admitting: Family Medicine

## 2020-10-13 VITALS — Ht 59.0 in

## 2020-10-13 DIAGNOSIS — R059 Cough, unspecified: Secondary | ICD-10-CM | POA: Diagnosis not present

## 2020-10-13 DIAGNOSIS — U071 COVID-19: Secondary | ICD-10-CM

## 2020-10-13 MED ORDER — BENZONATATE 100 MG PO CAPS: 200.0000 mg | ORAL_CAPSULE | Freq: Two times a day (BID) | ORAL | 0 refills | Status: AC | PRN

## 2020-10-13 MED ORDER — HYDROCODONE-HOMATROPINE 5-1.5 MG/5ML PO SYRP: 5.0000 mL | ORAL_SOLUTION | Freq: Two times a day (BID) | ORAL | 0 refills | Status: DC | PRN

## 2020-10-13 NOTE — Progress Notes (Signed)
Virtual Visit via Video Note I connected with Jessica Casey on 10/13/20 by a video enabled telemedicine application and verified that I am speaking with the correct person using two identifiers.  Location patient: home Location provider:work office Persons participating in the virtual visit: patient, provider  I discussed the limitations of evaluation and management by telemedicine and the availability of in person appointments. The patient expressed understanding and agreed to proceed.  Chief Complaint  Patient presents with  . Cough    HPI: Jessica Casey is a 70 yo female with hx of GERD,OA,and anxiety c/o severe non productive cough. Sick contact with COVID 19 infected person new years weekend. About 9 days ago she started with headache and "little" cough. Fever that lasted 4-5 days. Her husband started symptoms 2 days earlier and was positive for COVID 19 infection. She did a home positive test about 6 days ago. She is fully vaccinated, COVID 19 booster in 05/2020.  Most symptoms have resolved. Cough is not productive, interfering with sleep. Chest wall soreness sometimes with coughing spells. Cough is exacerbated by inspiration. No alleviating factors. No associated wheezing , SOB,or palpitations. Negative for sore throat, heartburn,N/V,abdominal pain,changes in bowel habits,or skin rash.  She has tried [employer].  ROS: See pertinent positives and negatives per HPI.  Past Medical History:  Diagnosis Date  . Anxiety   . Cataract   . Colonic polyp 2005 & 2013  . DDD (degenerative disc disease)   . Depression   . GERD (gastroesophageal reflux disease)   . H/O echocardiogram    before 2000, no need for f/u /w cardiac   . Heart murmur    MVP- echo- 2010, no symptoms   . Memory disorder 09/19/2017  . Mononucleosis 1971  . Osteoporosis   . Pinched nerve in neck   . Sleep apnea     Past Surgical History:  Procedure Laterality Date  . ANTERIOR CERVICAL DECOMP/DISCECTOMY FUSION N/A  05/20/2013   Procedure: ANTERIOR CERVICAL DECOMPRESSION/DISCECTOMY FUSION 2 LEVELS;  Surgeon: Erline Levine, MD;  Location: [place] [facility];  Service: Neurosurgery;  Laterality: N/A;  Cervical Seven-Thoracic One, Thoracic One-Two Anterior cervical decompression/Diskectomy/Fusion  . CARPAL TUNNEL RELEASE Right   . CATARACT EXTRACTION, BILATERAL  2013   Dr Gershon Crane, Viona Gilmore IOL  . CERVICAL FUSION     2 proceduresr Vertell Limber  . colonoscopy with polypectomy  2013   Dr Deatra Ina  . FRACTURE SURGERY     L wrist - June 2014  . HERNIA REPAIR    . KNEE SURGERY     Dr Wonda Olds ; bursa cystectomy  . LUMBAR DISC SURGERY     Dr.Stern  . ROTATOR CUFF REPAIR     Dr.Wainer  . UMBILICAL HERNIA REPAIR    . WRIST FRACTURE SURGERY Left 02/2013   Dr. Kathryne Hitch    Family History  Problem Relation Age of Onset  . Diabetes Father   . Coronary artery disease Father        S/P CBAG , no MI -- Dec  . Depression Father   . Diverticulosis Father   . Breast cancer Mother        estorgen receptor positive  . Stroke Mother 25       Dec  . Breast cancer Sister        estrogen receptor positive  . Depression Sister   . Depression Sister   . Depression Sister   . Depression Sister   . Depression Brother   . Bipolar disorder Other  Neice  . Colon polyps Sister   . Colon cancer Neg Hx   . Esophageal cancer Neg Hx   . Pancreatic cancer Neg Hx   . Rectal cancer Neg Hx   . Stomach cancer Neg Hx     Social History   Socioeconomic History  . Marital status: Married    Spouse name: Not on file  . Number of children: Not on file  . Years of education: 16+  . Highest education level: Not on file  Occupational History  . Occupation: Pharmacist, hospital- Retired  Tobacco Use  . Smoking status: Never Smoker  . Smokeless tobacco: Never Used  Vaping Use  . Vaping Use: Never used  Substance and Sexual Activity  . Alcohol use: No    Alcohol/week: 0.0 standard drinks  . Drug use: No  . Sexual activity: Not Currently     Partners: Male    Birth control/protection: Post-menopausal  Other Topics Concern  . Not on file  Social History Narrative   Lives   Caffeine use:    Right handed    Social Determinants of Health   Financial Resource Strain: Not on file  Food Insecurity: Not on file  Transportation Needs: Not on file  Physical Activity: Not on file  Stress: Not on file  Social Connections: Not on file  Intimate Partner Violence: Not on file    Current Outpatient Medications:  .  Acetaminophen (TYLENOL PO), Take by mouth., Disp: , Rfl:  .  benzonatate (TESSALON) 100 MG capsule, Take 2 capsules (200 mg total) by mouth 2 (two) times daily as needed for up to 10 days., Disp: 40 capsule, Rfl: 0 .  Biotin 300 MCG TABS, Take 300 mcg by mouth daily., Disp: , Rfl:  .  buPROPion (WELLBUTRIN XL) 300 MG 24 hr tablet, Take 1 tablet (300 mg total) by mouth daily., Disp: 90 tablet, Rfl: 3 .  Calcium Carb-Cholecalciferol (CALCIUM 1000 + D PO), Take by mouth daily., Disp: , Rfl:  .  Cholecalciferol (VITAMIN D) 2000 UNITS CAPS, Take by mouth., Disp: , Rfl:  .  diclofenac sodium (VOLTAREN) 1 % GEL, Apply 2 g topically 4 (four) times daily., Disp: 3 Tube, Rfl: 3 .  escitalopram (LEXAPRO) 10 MG tablet, Take 1 tablet (10 mg total) by mouth daily., Disp: 90 tablet, Rfl: 3 .  HYDROcodone-homatropine (HYCODAN) 5-1.5 MG/5ML syrup, Take 5 mLs by mouth every 12 (twelve) hours as needed for up to 10 days., Disp: 100 mL, Rfl: 0 .  Vaginal Lubricant (REPLENS VA), Place vaginally every 3 (three) days., Disp: , Rfl:   EXAM:  VITALS per patient if applicable:Ht 4\' 11"  (1.499 m)   LMP 10/01/1995 (Approximate)   BMI 26.94 kg/m   GENERAL: alert, oriented, appears well and in no acute distress  HEENT: atraumatic, conjunctiva clear, no obvious abnormalities on inspection of external nose and ears  NECK: normal movements of the head and neck  LUNGS: on inspection no signs of respiratory distress, breathing rate appears normal,  no obvious gross SOB, gasping or wheezing. Frequent non productive coughing spells during visit.  CV: no obvious cyanosis  Jessica: moves all visible extremities without noticeable abnormality  PSYCH/NEURO: pleasant and cooperative, no obvious depression or anxiety, speech and thought processing grossly intact  ASSESSMENT AND PLAN:  Discussed the following assessment and plan:  COVID-19 virus infection We discussed Dx, possible complications,and treatment options. It seems like acute symptoms have resolved. Clearly instructed about warning signs.  Cough - Plan: HYDROcodone-homatropine (HYCODAN) 5-1.5 MG/5ML  syrup, benzonatate (TESSALON) 100 MG capsule Hx and observation during video visit do not suggest a serious process. I do not think imaging is needed. Symptomatic treatment with Benzonatate and hycodan. We discussed some side effects of medications. Instructed about warning signs.  I discussed the assessment and treatment plan with the patient.Jessica Casey was provided an opportunity to ask questions and all were answered. Jessica Casey agreed with the plan and demonstrated an understanding of the instructions.   Return if symptoms worsen or fail to improve.  Doyt Castellana [name], MD

## 2020-10-14 ENCOUNTER — Encounter: Payer: Self-pay | Admitting: Family Medicine

## 2020-10-17 ENCOUNTER — Other Ambulatory Visit: Payer: Self-pay | Admitting: Family Medicine

## 2020-10-17 ENCOUNTER — Encounter: Payer: Self-pay | Admitting: Family Medicine

## 2020-10-17 MED ORDER — HYDROCOD POLST-CPM POLST ER 10-8 MG/5ML PO SUER: 5.0000 mL | Freq: Two times a day (BID) | ORAL | 0 refills | Status: AC | PRN

## 2020-10-26 ENCOUNTER — Encounter: Payer: Self-pay | Admitting: Family Medicine

## 2020-10-30 ENCOUNTER — Other Ambulatory Visit: Payer: Self-pay | Admitting: Family Medicine

## 2020-10-30 MED ORDER — BENZONATATE 100 MG PO CAPS: 200.0000 mg | ORAL_CAPSULE | Freq: Three times a day (TID) | ORAL | 0 refills | Status: AC | PRN

## 2020-11-02 ENCOUNTER — Ambulatory Visit: Payer: Medicare PPO | Admitting: Family Medicine

## 2020-11-02 ENCOUNTER — Other Ambulatory Visit: Payer: Self-pay

## 2020-11-02 DIAGNOSIS — H938X1 Other specified disorders of right ear: Secondary | ICD-10-CM | POA: Diagnosis not present

## 2020-11-02 DIAGNOSIS — Z8616 Personal history of COVID-19: Secondary | ICD-10-CM | POA: Diagnosis not present

## 2020-11-16 ENCOUNTER — Other Ambulatory Visit: Payer: Self-pay | Admitting: Obstetrics and Gynecology

## 2020-11-16 NOTE — Telephone Encounter (Signed)
AE 08/28/2020

## 2020-11-17 ENCOUNTER — Telehealth: Payer: Self-pay

## 2020-11-17 NOTE — Telephone Encounter (Signed)
Prolia VOB initiated via [employer] Portal.

## 2020-12-01 NOTE — Telephone Encounter (Addendum)
Pt ready for scheduling.  Estimated pt cost: approximately $40.00  Prolia VOB via [employer] Portal:  PRIMARY MEDICAL BENEFIT DETAILS (PHYSICIAN PURCHASE, OR REFERRAL TO TREATING SITE) COVERAGE AVAILABLE: Yes  COVERAGE DETAILS: For the primary MD Purchase option, Prolia is covered at 100%. Administration is subject to a $40 copay. Copays apply to an $ 4000 out of pocket max ($80 met). We have provided in network benefits only. AUTHORIZATION REQUIRED: Yes  PA PROCESS DETAILS: Prior authorization is on file Authorization # [id number] and is valid from 02/03/2020 through 09/29/2021. Number of injections is undisclosed units. The prior authorization department can be contacted at [phone].

## 2020-12-04 NOTE — Telephone Encounter (Signed)
Called pt and left VM to call the office.  

## 2020-12-13 ENCOUNTER — Ambulatory Visit (INDEPENDENT_AMBULATORY_CARE_PROVIDER_SITE_OTHER): Payer: Medicare PPO

## 2020-12-13 ENCOUNTER — Other Ambulatory Visit: Payer: Self-pay

## 2020-12-13 VITALS — Wt 125.0 lb

## 2020-12-13 DIAGNOSIS — G4733 Obstructive sleep apnea (adult) (pediatric): Secondary | ICD-10-CM | POA: Diagnosis not present

## 2020-12-13 DIAGNOSIS — Z0000 Encounter for general adult medical examination without abnormal findings: Secondary | ICD-10-CM

## 2020-12-13 NOTE — Patient Instructions (Addendum)
Jessica Casey , Thank you for taking time to come for your Medicare Wellness Visit. I appreciate your ongoing commitment to your health goals. Please review the following plan we discussed and let me know if I can assist you in the future.   Screening recommendations/referrals: Colonoscopy: Done 07/17/17 Mammogram: Done 05/22/20 Bone Density: Done 08/23/19 Recommended yearly ophthalmology/optometry visit for glaucoma screening and checkup Recommended yearly dental visit for hygiene and checkup  Vaccinations: Influenza vaccine: Up to date Pneumococcal vaccine: Due and discussed Tdap vaccine: Up to date Shingles vaccine: Completed 07/06/17 & 08/3017   Covid-19:Completed 1/21, 2/11, & 06/25/20  Advanced directives: Please bring a copy of your health care power of attorney and living will to the office at your convenience.  Conditions/risks identified: Keep on keeping on!  Next appointment: Follow up in one year for your annual wellness visit    Preventive Care 65 Years and Older, Female Preventive care refers to lifestyle choices and visits with your health care provider that can promote health and wellness. What does preventive care include?  A yearly physical exam. This is also called an annual well check.  Dental exams once or twice a year.  Routine eye exams. Ask your health care provider how often you should have your eyes checked.  Personal lifestyle choices, including:  Daily care of your teeth and gums.  Regular physical activity.  Eating a healthy diet.  Avoiding tobacco and drug use.  Limiting alcohol use.  Practicing safe sex.  Taking low-dose aspirin every day.  Taking vitamin and mineral supplements as recommended by your health care provider. What happens during an annual well check? The services and screenings done by your health care provider during your annual well check will depend on your age, overall health, lifestyle risk factors, and family history of  disease. Counseling  Your health care provider may ask you questions about your:  Alcohol use.  Tobacco use.  Drug use.  Emotional well-being.  Home and relationship well-being.  Sexual activity.  Eating habits.  History of falls.  Memory and ability to understand (cognition).  Work and work Statistician.  Reproductive health. Screening  You may have the following tests or measurements:  Height, weight, and BMI.  Blood pressure.  Lipid and cholesterol levels. These may be checked every 5 years, or more frequently if you are over 69 years old.  Skin check.  Lung cancer screening. You may have this screening every year starting at age 66 if you have a 30-pack-year history of smoking and currently smoke or have quit within the past 15 years.  Fecal occult blood test (FOBT) of the stool. You may have this test every year starting at age 70.  Flexible sigmoidoscopy or colonoscopy. You may have a sigmoidoscopy every 5 years or a colonoscopy every 10 years starting at age 33.  Hepatitis C blood test.  Hepatitis B blood test.  Sexually transmitted disease (STD) testing.  Diabetes screening. This is done by checking your blood sugar (glucose) after you have not eaten for a while (fasting). You may have this done every 1-3 years.  Bone density scan. This is done to screen for osteoporosis. You may have this done starting at age 58.  Mammogram. This may be done every 1-2 years. Talk to your health care provider about how often you should have regular mammograms. Talk with your health care provider about your test results, treatment options, and if necessary, the need for more tests. Vaccines  Your health care provider  may recommend certain vaccines, such as:  Influenza vaccine. This is recommended every year.  Tetanus, diphtheria, and acellular pertussis (Tdap, Td) vaccine. You may need a Td booster every 10 years.  Zoster vaccine. You may need this after age  83.  Pneumococcal 13-valent conjugate (PCV13) vaccine. One dose is recommended after age 14.  Pneumococcal polysaccharide (PPSV23) vaccine. One dose is recommended after age 78. Talk to your health care provider about which screenings and vaccines you need and how often you need them. This information is not intended to replace advice given to you by your health care provider. Make sure you discuss any questions you have with your health care provider. Document Released: 10/13/2015 Document Revised: 06/05/2016 Document Reviewed: 07/18/2015 Elsevier Interactive Patient Education  2017 [employer] Prevention in the Home Falls can cause injuries. They can happen to people of all ages. There are many things you can do to make your home safe and to help prevent falls. What can I do on the outside of my home?  Regularly fix the edges of walkways and driveways and fix any cracks.  Remove anything that might make you trip as you walk through a door, such as a raised step or threshold.  Trim any bushes or trees on the path to your home.  Use bright outdoor lighting.  Clear any walking paths of anything that might make someone trip, such as rocks or tools.  Regularly check to see if handrails are loose or broken. Make sure that both sides of any steps have handrails.  Any raised decks and porches should have guardrails on the edges.  Have any leaves, snow, or ice cleared regularly.  Use sand or salt on walking paths during winter.  Clean up any spills in your garage right away. This includes oil or grease spills. What can I do in the bathroom?  Use night lights.  Install grab bars by the toilet and in the tub and shower. Do not use towel bars as grab bars.  Use non-skid mats or decals in the tub or shower.  If you need to sit down in the shower, use a plastic, non-slip stool.  Keep the floor dry. Clean up any water that spills on the floor as soon as it happens.  Remove  soap buildup in the tub or shower regularly.  Attach bath mats securely with double-sided non-slip rug tape.  Do not have throw rugs and other things on the floor that can make you trip. What can I do in the bedroom?  Use night lights.  Make sure that you have a light by your bed that is easy to reach.  Do not use any sheets or blankets that are too big for your bed. They should not hang down onto the floor.  Have a firm chair that has side arms. You can use this for support while you get dressed.  Do not have throw rugs and other things on the floor that can make you trip. What can I do in the kitchen?  Clean up any spills right away.  Avoid walking on wet floors.  Keep items that you use a lot in easy-to-reach places.  If you need to reach something above you, use a strong step stool that has a grab bar.  Keep electrical cords out of the way.  Do not use floor polish or wax that makes floors slippery. If you must use wax, use non-skid floor wax.  Do not have throw rugs  and other things on the floor that can make you trip. What can I do with my stairs?  Do not leave any items on the stairs.  Make sure that there are handrails on both sides of the stairs and use them. Fix handrails that are broken or loose. Make sure that handrails are as long as the stairways.  Check any carpeting to make sure that it is firmly attached to the stairs. Fix any carpet that is loose or worn.  Avoid having throw rugs at the top or bottom of the stairs. If you do have throw rugs, attach them to the floor with carpet tape.  Make sure that you have a light switch at the top of the stairs and the bottom of the stairs. If you do not have them, ask someone to add them for you. What else can I do to help prevent falls?  Wear shoes that:  Do not have high heels.  Have rubber bottoms.  Are comfortable and fit you well.  Are closed at the toe. Do not wear sandals.  If you use a  stepladder:  Make sure that it is fully opened. Do not climb a closed stepladder.  Make sure that both sides of the stepladder are locked into place.  Ask someone to hold it for you, if possible.  Clearly mark and make sure that you can see:  Any grab bars or handrails.  First and last steps.  Where the edge of each step is.  Use tools that help you move around (mobility aids) if they are needed. These include:  Canes.  Walkers.  Scooters.  Crutches.  Turn on the lights when you go into a dark area. Replace any light bulbs as soon as they burn out.  Set up your furniture so you have a clear path. Avoid moving your furniture around.  If any of your floors are uneven, fix them.  If there are any pets around you, be aware of where they are.  Review your medicines with your doctor. Some medicines can make you feel dizzy. This can increase your chance of falling. Ask your doctor what other things that you can do to help prevent falls. This information is not intended to replace advice given to you by your health care provider. Make sure you discuss any questions you have with your health care provider. Document Released: 07/13/2009 Document Revised: 02/22/2016 Document Reviewed: 10/21/2014 Elsevier Interactive Patient Education  2017 [employer].

## 2020-12-13 NOTE — Progress Notes (Signed)
Virtual Visit via Telephone Note  I connected with  Jessica Casey on 12/13/20 at  1:45 PM EDT by telephone and verified that I am speaking with the correct person using two identifiers.  Location: Patient: Home  Provider: Office  Persons participating in the virtual visit: patient/Nurse Health Advisor   I discussed the limitations, risks, security and privacy concerns of performing an evaluation and management service by telephone and the availability of in person appointments. The patient expressed understanding and agreed to proceed.  Interactive audio and video telecommunications were attempted between this nurse and patient, however failed, due to patient having technical difficulties OR patient did not have access to video capability.  We continued and completed visit with audio only.  Some vital signs may be absent or patient reported.   Willette Brace, LPN    Subjective:   Jessica Casey is a 70 y.o. female who presents for Medicare Annual (Subsequent) preventive examination.  Review of Systems     Cardiac Risk Factors include: advanced age (>38men, >78 women)     Objective:    Today's Vitals   12/13/20 1346  Weight: 125 lb (56.7 kg)   Body mass index is 25.25 kg/m.  Advanced Directives 12/13/2020 02/04/2018 07/08/2017 05/13/2013  Does Patient Have a Medical Advance Directive? Yes Yes Yes Patient has advance directive, copy not in chart  Type of Advance Directive Living will;Healthcare Power of [place];Living will Living will  Copy of [place] in Chart? No - copy requested - - -    Current Medications (verified) Outpatient Encounter Medications as of 12/13/2020  Medication Sig  . Acetaminophen (TYLENOL PO) Take by mouth.  . Biotin 300 MCG TABS Take 300 mcg by mouth daily.  Marland Kitchen buPROPion (WELLBUTRIN XL) 300 MG 24 hr tablet Take 1 tablet (300 mg total) by mouth daily.  . Calcium Carb-Cholecalciferol (CALCIUM 1000 + D  PO) Take 600 mg by mouth daily.  . Cholecalciferol (VITAMIN D) 2000 UNITS CAPS Take by mouth.  . diclofenac sodium (VOLTAREN) 1 % GEL Apply 2 g topically 4 (four) times daily.  Marland Kitchen escitalopram (LEXAPRO) 10 MG tablet Take 1 tablet (10 mg total) by mouth daily.  . Vaginal Lubricant (REPLENS VA) Place vaginally every 3 (three) days.   No facility-administered encounter medications on file as of 12/13/2020.    Allergies (verified) Gabapentin   History: Past Medical History:  Diagnosis Date  . Anxiety   . Cataract   . Colonic polyp 2005 & 2013  . DDD (degenerative disc disease)   . Depression   . GERD (gastroesophageal reflux disease)   . H/O echocardiogram    before 2000, no need for f/u /w cardiac   . Heart murmur    MVP- echo- 2010, no symptoms   . Memory disorder 09/19/2017  . Mononucleosis 1971  . Osteoporosis   . Pinched nerve in neck   . Sleep apnea    Past Surgical History:  Procedure Laterality Date  . ANTERIOR CERVICAL DECOMP/DISCECTOMY FUSION N/A 05/20/2013   Procedure: ANTERIOR CERVICAL DECOMPRESSION/DISCECTOMY FUSION 2 LEVELS;  Surgeon: Erline Levine, MD;  Location: [facility];  Service: Neurosurgery;  Laterality: N/A;  Cervical Seven-Thoracic One, Thoracic One-Two Anterior cervical decompression/Diskectomy/Fusion  . CARPAL TUNNEL RELEASE Right   . CATARACT EXTRACTION, BILATERAL  2013   Dr Gershon Crane, Viona Gilmore IOL  . CERVICAL FUSION     2 proceduresr Vertell Limber  . colonoscopy with polypectomy  2013   Dr Deatra Ina  .  FRACTURE SURGERY     L wrist - June 2014  . HERNIA REPAIR    . KNEE SURGERY     Dr Wonda Olds ; bursa cystectomy  . LUMBAR DISC SURGERY     Dr.Stern  . ROTATOR CUFF REPAIR     Dr.Wainer  . UMBILICAL HERNIA REPAIR    . WRIST FRACTURE SURGERY Left 02/2013   Dr. Kathryne Hitch   Family History  Problem Relation Age of Onset  . Diabetes Father   . Coronary artery disease Father        S/P CBAG , no MI -- Dec  . Depression Father   . Diverticulosis Father   .  Breast cancer Mother        estorgen receptor positive  . Stroke Mother 55       Dec  . Breast cancer Sister        estrogen receptor positive  . Depression Sister   . Depression Sister   . Depression Sister   . Depression Sister   . Depression Brother   . Bipolar disorder Other        Neice  . Colon polyps Sister   . Colon cancer Neg Hx   . Esophageal cancer Neg Hx   . Pancreatic cancer Neg Hx   . Rectal cancer Neg Hx   . Stomach cancer Neg Hx    Social History   Socioeconomic History  . Marital status: Married    Spouse name: Not on file  . Number of children: Not on file  . Years of education: 16+  . Highest education level: Not on file  Occupational History  . Occupation: Pharmacist, hospital- Retired  Tobacco Use  . Smoking status: Never Smoker  . Smokeless tobacco: Never Used  Vaping Use  . Vaping Use: Never used  Substance and Sexual Activity  . Alcohol use: No    Alcohol/week: 0.0 standard drinks  . Drug use: No  . Sexual activity: Not Currently    Partners: Male    Birth control/protection: Post-menopausal  Other Topics Concern  . Not on file  Social History Narrative   Lives   Caffeine use:    Right handed    Social Determinants of Health   Financial Resource Strain: Low Risk   . Difficulty of Paying Living Expenses: Not hard at all  Food Insecurity: No Food Insecurity  . Worried About Charity fundraiser in the Last Year: Never true  . Ran Out of Food in the Last Year: Never true  Transportation Needs: No Transportation Needs  . Lack of Transportation (Medical): No  . Lack of Transportation (Non-Medical): No  Physical Activity: Insufficiently Active  . Days of Exercise per Week: 3 days  . Minutes of Exercise per Session: 30 min  Stress: No Stress Concern Present  . Feeling of Stress : Not at all  Social Connections: Socially Integrated  . Frequency of Communication with Friends and Family: More than three times a week  . Frequency of Social Gatherings  with Friends and Family: More than three times a week  . Attends Religious Services: More than 4 times per year  . Active Member of Clubs or Organizations: Yes  . Attends Archivist Meetings: 1 to 4 times per year  . Marital Status: Married    Tobacco Counseling Counseling given: Not Answered   Clinical Intake:  Pre-visit preparation completed: Yes  Pain : No/denies pain     BMI - recorded: 25.25 Nutritional Status: BMI  25 -29 Overweight Nutritional Risks: None Diabetes: No  How often do you need to have someone help you when you read instructions, pamphlets, or other written materials from your doctor or pharmacy?: 1 - Never  Diabetic?No  Interpreter Needed?: No  Information entered by :: Charlott Rakes, LPN   Activities of Daily Living In your present state of health, do you have any difficulty performing the following activities: 12/13/2020  Hearing? Y  Vision? N  Difficulty concentrating or making decisions? N  Walking or climbing stairs? N  Dressing or bathing? N  Doing errands, shopping? N  Preparing Food and eating ? N  Using the Toilet? N  In the past six months, have you accidently leaked urine? N  Do you have problems with loss of bowel control? N  Managing your Medications? N  Managing your Finances? N  Housekeeping or managing your Housekeeping? N  Some recent data might be hidden    Patient Care Team: [name], [name], MD as PCP - General (Family Medicine) Nunzio Cobbs, MD as Consulting Physician (Obstetrics and Gynecology)  Indicate any recent Medical Services you may have received from other than [facility] providers in the past year (date may be approximate).     Assessment:   This is a routine wellness examination for [facility].  Hearing/Vision screen  Hearing Screening   125Hz  250Hz  500Hz  1000Hz  2000Hz  3000Hz  4000Hz  6000Hz  8000Hz   Right ear:           Left ear:           Comments: Pt states difficulty with hearing    Vision Screening Comments: Pt follows up with provider in [place] for annual eye exams   Dietary issues and exercise activities discussed: Current Exercise Habits: Home exercise routine, Type of exercise: walking, Time (Minutes): 30, Frequency (Times/Week): 3, Weekly Exercise (Minutes/Week): 90  Goals    . Exercise 150 min/wk Moderate Activity     Start walking every day in the neighborhood;     . Patient Stated     Continue to keep on keeping on      Depression Screen PHQ 2/9 Scores 12/13/2020 02/09/2019 02/04/2018  PHQ - 2 Score 0 0 0    Fall Risk Fall Risk  12/13/2020 02/09/2019 02/04/2018  Falls in the past year? 0 0 No  Number falls in past yr: 0 0 -  Injury with Fall? 0 0 -  Risk for fall due to : - Orthopedic patient -  Follow up Falls prevention discussed Education provided -    [place]:  Any stairs in or around the home? Yes  If so, are there any without handrails? No  Home free of loose throw rugs in walkways, pet beds, electrical cords, etc? Yes  Adequate lighting in your home to reduce risk of falls? No   ASSISTIVE DEVICES UTILIZED TO PREVENT FALLS:  Life alert? No  Use of a cane, walker or w/c? No  Grab bars in the bathroom? Yes  Shower chair or bench in shower? Yes  Elevated toilet seat or a handicapped toilet? No   TIMED UP AND GO:  Was the test performed? No .     Cognitive Function: MMSE - Mini Mental State Exam 02/04/2018 09/19/2017  Not completed: (No Data) -  Orientation to time - 5  Orientation to Place - 5  Registration - 3  Attention/ Calculation - 5  Recall - 3  Language- name 2 objects - 2  Language- repeat - 1  Language- follow 3 step command - 3  Language- read & follow direction - 1  Write a sentence - 1  Copy design - 1  Total score - 30     6CIT Screen 12/13/2020  What Year? 0 points  What month? 0 points  Count back from 20 0 points  Months in reverse 0 points  Repeat phrase 0 points     Immunizations Immunization History  Administered Date(s) Administered  . Influenza, High Dose Seasonal PF 06/10/2016, 06/14/2018, 06/14/2019  . Influenza-Unspecified 06/10/2016, 07/06/2017, 06/14/2018, 06/14/2019, 06/25/2020  . PFIZER Comirnaty(Gray Top)Covid-19 Tri-Sucrose Vaccine 10/21/2019, 11/11/2019, 06/25/2020  . Pneumococcal Conjugate-13 02/04/2018  . Td 06/09/2007  . Tdap 06/14/2019  . Zoster Recombinat (Shingrix) 07/06/2017, 09/28/2017    TDAP status: Up to date  Flu Vaccine status: Up to date  Pneumococcal vaccine status: Up to date  Covid-19 vaccine status: Completed vaccines  Qualifies for Shingles Vaccine? Yes   Zostavax completed Yes   Shingrix Completed?: Yes  Screening Tests Health Maintenance  Topic Date Due  . PNA vac Low Risk Adult (2 of 2 - PPSV23) 02/05/2019  . MAMMOGRAM  05/22/2022  . COLONOSCOPY (Pts 45-68yrs Insurance coverage will need to be confirmed)  07/17/2022  . INFLUENZA VACCINE  Completed  . DEXA SCAN  Completed  . COVID-19 Vaccine  Completed  . Hepatitis C Screening  Completed  . HPV VACCINES  Aged Out  . TETANUS/TDAP  Discontinued    Health Maintenance  Health Maintenance Due  Topic Date Due  . PNA vac Low Risk Adult (2 of 2 - PPSV23) 02/05/2019    Colorectal cancer screening: Type of screening: Colonoscopy. Completed 07/17/17. Repeat every 5 years  Mammogram status: Completed 05/22/20. Repeat every year  Bone Density status: Completed 08/23/19. Results reflect: Bone density results: OSTEOPENIA. Repeat every 2 years.   Additional Screening:  Hepatitis C Screening: Completed 06/11/19  Vision Screening: Recommended annual ophthalmology exams for early detection of glaucoma and other disorders of the eye. Is the patient up to date with their annual eye exam?  Yes  Who is the provider or what is the name of the office in which the patient attends annual eye exams? Provider in [place] If pt is not established with a provider,  would they like to be referred to a provider to establish care? No .   Dental Screening: Recommended annual dental exams for proper oral hygiene  Community Resource Referral / Chronic Care Management: CRR required this visit?  No   CCM required this visit?  No      Plan:     I have personally reviewed and noted the following in the patient's chart:   . Medical and social history . Use of alcohol, tobacco or illicit drugs  . Current medications and supplements . Functional ability and status . Nutritional status . Physical activity . Advanced directives . List of other physicians . Hospitalizations, surgeries, and ER visits in previous 12 months . Vitals . Screenings to include cognitive, depression, and falls . Referrals and appointments  In addition, I have reviewed and discussed with patient certain preventive protocols, quality metrics, and best practice recommendations. A written personalized care plan for preventive services as well as general preventive health recommendations were provided to patient.     Willette Brace, LPN   7/40/8144   Nurse Notes: None

## 2020-12-13 NOTE — Telephone Encounter (Signed)
Pt ready for scheduling.  Prolia: 0% co-insurance Admin fee: $40  Total out-of-pocket cost: approximately $40.  PA approved, #[id number], valid 02/03/20-09/29/21  Last inj: 03/07/20 Next due: 09/07/20 OVER DUE

## 2020-12-15 NOTE — Telephone Encounter (Signed)
LMTCB to schedule prolia

## 2020-12-19 DIAGNOSIS — H6983 Other specified disorders of Eustachian tube, bilateral: Secondary | ICD-10-CM | POA: Diagnosis not present

## 2020-12-19 DIAGNOSIS — H6501 Acute serous otitis media, right ear: Secondary | ICD-10-CM | POA: Diagnosis not present

## 2020-12-19 DIAGNOSIS — H6993 Unspecified Eustachian tube disorder, bilateral: Secondary | ICD-10-CM | POA: Insufficient documentation

## 2020-12-19 DIAGNOSIS — H903 Sensorineural hearing loss, bilateral: Secondary | ICD-10-CM | POA: Insufficient documentation

## 2021-01-23 DIAGNOSIS — H6501 Acute serous otitis media, right ear: Secondary | ICD-10-CM | POA: Diagnosis not present

## 2021-01-23 DIAGNOSIS — H6983 Other specified disorders of Eustachian tube, bilateral: Secondary | ICD-10-CM | POA: Diagnosis not present

## 2021-01-23 DIAGNOSIS — H903 Sensorineural hearing loss, bilateral: Secondary | ICD-10-CM | POA: Diagnosis not present

## 2021-03-05 DIAGNOSIS — Z1283 Encounter for screening for malignant neoplasm of skin: Secondary | ICD-10-CM | POA: Diagnosis not present

## 2021-03-05 DIAGNOSIS — C44722 Squamous cell carcinoma of skin of right lower limb, including hip: Secondary | ICD-10-CM | POA: Diagnosis not present

## 2021-03-05 DIAGNOSIS — X32XXXD Exposure to sunlight, subsequent encounter: Secondary | ICD-10-CM | POA: Diagnosis not present

## 2021-03-05 DIAGNOSIS — L570 Actinic keratosis: Secondary | ICD-10-CM | POA: Diagnosis not present

## 2021-03-05 DIAGNOSIS — D225 Melanocytic nevi of trunk: Secondary | ICD-10-CM | POA: Diagnosis not present

## 2021-03-11 NOTE — Telephone Encounter (Signed)
Please follow up with pt regarding scheduling Prolia inj.   Thanks!

## 2021-03-12 NOTE — Telephone Encounter (Signed)
LMTCB to schedule prolia

## 2021-03-14 ENCOUNTER — Other Ambulatory Visit: Payer: Self-pay

## 2021-03-14 ENCOUNTER — Encounter: Payer: Self-pay | Admitting: Obstetrics and Gynecology

## 2021-03-14 ENCOUNTER — Ambulatory Visit: Payer: Medicare PPO | Admitting: Obstetrics and Gynecology

## 2021-03-14 VITALS — BP 120/74 | HR 84

## 2021-03-14 DIAGNOSIS — N3090 Cystitis, unspecified without hematuria: Secondary | ICD-10-CM

## 2021-03-14 DIAGNOSIS — R300 Dysuria: Secondary | ICD-10-CM

## 2021-03-14 MED ORDER — SULFAMETHOXAZOLE-TRIMETHOPRIM 800-160 MG PO TABS: 1.0000 | ORAL_TABLET | Freq: Two times a day (BID) | ORAL | 0 refills | Status: DC

## 2021-03-14 NOTE — Progress Notes (Signed)
GYNECOLOGY  VISIT   HPI: 70 y.o.   Married  Caucasian  female   G2P2002 with Patient's last menstrual period was 10/01/1995 (approximate).   here for urgency and dysuria for a couple of weeks.  Not her normal urgency.  Pain started this last weekend while in [place].  She is now on her way to the beach.   No blood in urine.   No fever or chills.   No change in her baseline back pain.   Some right inguinal discomfort.   No nausea.   GYNECOLOGIC HISTORY: Patient's last menstrual period was 10/01/1995 (approximate). Contraception: post menopausal status. Menopausal hormone therapy: none Last mammogram:  05/22/20 BIRADS 1 negative/density b Last pap smear:   07/28/18 Neg        OB History     Gravida  2   Para  2   Term  2   Preterm      AB      Living  2      SAB      IAB      Ectopic      Multiple      Live Births                 Patient Active Problem List   Diagnosis Date Noted   Osteoporosis without current pathological fracture 10/03/2020   Memory disorder 09/19/2017   OSA (obstructive sleep apnea) 02/04/2017   Osteoarthritis, multiple sites 08/06/2016   Cough 02/28/2016   Pain of left lower leg 08/08/2015   Anemia 02/17/2013   Hypoalbuminemia 02/17/2013   Vitamin D deficiency, unspecified 02/17/2013   Depression, major, in remission ([place]) 08/02/2009   Osteopenia 08/02/2009   Fatigue 08/02/2009   COLONIC POLYPS, HX OF 08/02/2009   DEGENERATIVE DISC DISEASE 10/27/2008   Brachial neuritis or radiculitis 10/27/2008    Past Medical History:  Diagnosis Date   Anxiety    Cataract    Colonic polyp 2005 & 2013   DDD (degenerative disc disease)    Depression    GERD (gastroesophageal reflux disease)    H/O echocardiogram    before 2000, no need for f/u /w cardiac    Heart murmur    MVP- echo- 2010, no symptoms    Memory disorder 09/19/2017   Mononucleosis 1971   Osteoporosis    Pinched nerve in neck    Sleep apnea     Past Surgical  History:  Procedure Laterality Date   ANTERIOR CERVICAL DECOMP/DISCECTOMY FUSION N/A 05/20/2013   Procedure: ANTERIOR CERVICAL DECOMPRESSION/DISCECTOMY FUSION 2 LEVELS;  Surgeon: Erline Levine, MD;  Location: [facility];  Service: Neurosurgery;  Laterality: N/A;  Cervical Seven-Thoracic One, Thoracic One-Two Anterior cervical decompression/Diskectomy/Fusion   CARPAL TUNNEL RELEASE Right    CATARACT EXTRACTION, BILATERAL  2013   Dr Gershon Crane, [place]     2 proceduresr Vertell Limber   colonoscopy with polypectomy  2013   Dr Deatra Ina   FRACTURE SURGERY     L wrist - June 2014   HERNIA REPAIR     KNEE SURGERY     Dr Wonda Olds ; bursa cystectomy   LUMBAR DISC SURGERY     Dr.Stern   ROTATOR CUFF REPAIR     Dr.Wainer   UMBILICAL HERNIA REPAIR     WRIST FRACTURE SURGERY Left 02/2013   Dr. Kathryne Hitch    Current Outpatient Medications  Medication Sig Dispense Refill   Acetaminophen (TYLENOL PO) Take by mouth.  Biotin 300 MCG TABS Take 300 mcg by mouth daily.     buPROPion (WELLBUTRIN XL) 300 MG 24 hr tablet Take 1 tablet (300 mg total) by mouth daily. 90 tablet 3   Calcium Carb-Cholecalciferol (CALCIUM 1000 + D PO) Take 600 mg by mouth daily.     Cholecalciferol (VITAMIN D) 2000 UNITS CAPS Take by mouth.     diclofenac sodium (VOLTAREN) 1 % GEL Apply 2 g topically 4 (four) times daily. 3 Tube 3   escitalopram (LEXAPRO) 10 MG tablet Take 1 tablet (10 mg total) by mouth daily. 90 tablet 3   sulfamethoxazole-trimethoprim (BACTRIM DS) 800-160 MG tablet Take 1 tablet by mouth 2 (two) times daily. Take for one week. 14 tablet 0   Vaginal Lubricant (REPLENS VA) Place vaginally every 3 (three) days.     No current facility-administered medications for this visit.     ALLERGIES: Gabapentin  Family History  Problem Relation Age of Onset   Diabetes Father    Coronary artery disease Father        S/P CBAG , no MI -- Dec   Depression Father    Diverticulosis Father    Breast cancer  Mother        estorgen receptor positive   Stroke Mother 47       Dec   Breast cancer Sister        estrogen receptor positive   Depression Sister    Depression Sister    Depression Sister    Depression Sister    Depression Brother    Bipolar disorder Other        Neice   Colon polyps Sister    Colon cancer Neg Hx    Esophageal cancer Neg Hx    Pancreatic cancer Neg Hx    Rectal cancer Neg Hx    Stomach cancer Neg Hx     Social History   Socioeconomic History   Marital status: Married    Spouse name: Not on file   Number of children: Not on file   Years of education: 16+   Highest education level: Not on file  Occupational History   Occupation: Pharmacist, hospital- Retired  Tobacco Use   Smoking status: Never   Smokeless tobacco: Never  Scientific laboratory technician Use: Never used  Substance and Sexual Activity   Alcohol use: No    Alcohol/week: 0.0 standard drinks   Drug use: No   Sexual activity: Not Currently    Partners: Male    Birth control/protection: Post-menopausal  Other Topics Concern   Not on file  Social History Narrative   Lives   Caffeine use:    Right handed    Social Determinants of Health   Financial Resource Strain: Low Risk    Difficulty of Paying Living Expenses: Not hard at all  Food Insecurity: No Food Insecurity   Worried About Charity fundraiser in the Last Year: Never true   [place] in the Last Year: Never true  Transportation Needs: No Transportation Needs   Lack of Transportation (Medical): No   Lack of Transportation (Non-Medical): No  Physical Activity: Insufficiently Active   Days of Exercise per Week: 3 days   Minutes of Exercise per Session: 30 min  Stress: No Stress Concern Present   Feeling of Stress : Not at all  Social Connections: Socially Integrated   Frequency of Communication with Friends and Family: More than three times a week   Frequency of Social  Gatherings with Friends and Family: More than three times a week    Attends Religious Services: More than 4 times per year   Active Member of Clubs or Organizations: Yes   Attends Archivist Meetings: 1 to 4 times per year   Marital Status: Married  Human resources officer Violence: Not At Risk   Fear of Current or Ex-Partner: No   Emotionally Abused: No   Physically Abused: No   Sexually Abused: No    Review of Systems  Constitutional: Negative.   HENT: Negative.    Eyes: Negative.   Respiratory: Negative.    Cardiovascular: Negative.   Gastrointestinal: Negative.   Endocrine: Negative.   Genitourinary:  Positive for dysuria and urgency.  Musculoskeletal: Negative.   Skin: Negative.   Allergic/Immunologic: Negative.   Neurological: Negative.   Hematological: Negative.   Psychiatric/Behavioral: Negative.     PHYSICAL EXAMINATION:    BP 120/74   Pulse 84   LMP 10/01/1995 (Approximate)   SpO2 97%     General appearance: alert, cooperative and appears stated age  Urinalysis:  sg 1.020,  pH 7.5, , 3+ LE, packed WBC, 3 - 10 RBC, many bacteria.  ASSESSMENT  Cystitis.  Hx UTI.   PLAN  UC sent.  Bactrim DS po bid x 7 days.  Will do extended treatment as patient is going out of town.  She has pyridium on hand.  She will hydrate well.  She will call back or go to urgent care if she is not improving.   20 min total time was spent for this patient encounter, including preparation, face-to-face counseling with the patient, coordination of care, and documentation of the encounter.

## 2021-03-14 NOTE — Patient Instructions (Signed)

## 2021-03-17 LAB — URINALYSIS, COMPLETE W/RFL CULTURE
Glucose, UA: NEGATIVE
Hyaline Cast: NONE SEEN /LPF
Ketones, ur: NEGATIVE
Nitrites, Initial: NEGATIVE
Specific Gravity, Urine: 1.02 (ref 1.001–1.035)
pH: 7.5 (ref 5.0–8.0)

## 2021-03-17 LAB — URINE CULTURE
MICRO NUMBER:: 12010859
SPECIMEN QUALITY:: ADEQUATE

## 2021-03-17 LAB — CULTURE INDICATED

## 2021-03-23 ENCOUNTER — Encounter: Payer: Self-pay | Admitting: Family Medicine

## 2021-03-26 NOTE — Telephone Encounter (Signed)
Medication can not be continued. The next step will be pulmonologist evaluation or trial of medication like Protonix 40 mg 30 min before breakfast to treat possible acid reflux. Thanks, BJ

## 2021-03-30 DIAGNOSIS — M25551 Pain in right hip: Secondary | ICD-10-CM | POA: Diagnosis not present

## 2021-03-30 DIAGNOSIS — M25561 Pain in right knee: Secondary | ICD-10-CM | POA: Diagnosis not present

## 2021-04-03 NOTE — Telephone Encounter (Signed)
Please follow up with patient to see if they wish to continue with Prolia therapy for Osteoporosis.   Thanks!

## 2021-04-04 NOTE — Telephone Encounter (Signed)
Jessica Casey, Let's send her a letter about this.  It appears that we called her several times without response.  Sincerely, Philemon Kingdom MD

## 2021-04-04 NOTE — Telephone Encounter (Signed)
Please advise 

## 2021-04-13 DIAGNOSIS — M25551 Pain in right hip: Secondary | ICD-10-CM | POA: Diagnosis not present

## 2021-04-17 NOTE — Telephone Encounter (Signed)
Pt archived in [email or web address]  Please advise if patient wishes to proceed with Prolia and if Dr Cruzita Lederer is agreeable.

## 2021-05-28 DIAGNOSIS — Z1231 Encounter for screening mammogram for malignant neoplasm of breast: Secondary | ICD-10-CM | POA: Diagnosis not present

## 2021-05-30 DIAGNOSIS — M25551 Pain in right hip: Secondary | ICD-10-CM | POA: Diagnosis not present

## 2021-06-01 ENCOUNTER — Encounter: Payer: Self-pay | Admitting: Family Medicine

## 2021-06-01 DIAGNOSIS — G4733 Obstructive sleep apnea (adult) (pediatric): Secondary | ICD-10-CM

## 2021-06-05 NOTE — Telephone Encounter (Signed)
Okay to send new order for Cpap to [employer]?

## 2021-06-05 NOTE — Telephone Encounter (Signed)
We have not addressed OSA, I usually do not manage this problem and refer pts with CPAP's to sleep specialist. Who has managed her OSA? In regard to hip surgery clearance, appt is needed. Thanks, BJ

## 2021-06-25 ENCOUNTER — Encounter: Payer: Self-pay | Admitting: Family Medicine

## 2021-07-07 ENCOUNTER — Other Ambulatory Visit: Payer: Self-pay | Admitting: Obstetrics and Gynecology

## 2021-07-09 NOTE — Telephone Encounter (Signed)
Annual exam scheduled on 09/17/21

## 2021-07-16 ENCOUNTER — Encounter: Payer: Self-pay | Admitting: Family Medicine

## 2021-07-17 ENCOUNTER — Encounter: Payer: Self-pay | Admitting: Pulmonary Disease

## 2021-07-17 ENCOUNTER — Other Ambulatory Visit: Payer: Self-pay

## 2021-07-17 ENCOUNTER — Ambulatory Visit (INDEPENDENT_AMBULATORY_CARE_PROVIDER_SITE_OTHER): Payer: Medicare PPO | Admitting: Pulmonary Disease

## 2021-07-17 VITALS — BP 112/70 | HR 99 | Temp 98.0°F | Ht 59.0 in | Wt 129.4 lb

## 2021-07-17 DIAGNOSIS — G4733 Obstructive sleep apnea (adult) (pediatric): Secondary | ICD-10-CM

## 2021-07-17 NOTE — Patient Instructions (Signed)
DME company says she was using [employer] order to DME company for new CPAP  Auto CPAP 5-15 with heated humidification  Update [place] with when you get set up, unfortunately, it is taking a little bit of time for everyone  Tentative follow-up in a year  Call with any other significant concerns

## 2021-07-17 NOTE — Progress Notes (Signed)
Jessica Casey    [id number]    April 28, 1951  Primary Care Physician:Jordan, Malka So, MD  Referring Physician: [name], [name], MD [address] [place],  [place] [address]  Chief complaint:   Patient with obstructive sleep apnea  HPI:  Diagnosed with obstructive sleep apnea in 2018 Moderate obstructive sleep apnea Was using CPAP regularly and benefiting from treatment  Machine broke about July 2022, just quit working  Usually goes to bed between 11 and 12 Takes a little bit of time to fall asleep 2-3 awakenings Wake up time of 7 AM  She is snoring, tired during the day from not using a CPAP  Was sleeping better and functioning much better when she was on CPAP  No other significant health issues Never smoker  She does have some sleepiness during the day with not being on CPAP   Outpatient Encounter Medications as of 07/17/2021  Medication Sig   Acetaminophen (TYLENOL PO) Take by mouth.   Biotin 300 MCG TABS Take 300 mcg by mouth daily.   buPROPion (WELLBUTRIN XL) 300 MG 24 hr tablet TAKE 1 TABLET EVERY DAY   Calcium Carb-Cholecalciferol (CALCIUM 1000 + D PO) Take 600 mg by mouth daily.   Cholecalciferol (VITAMIN D) 2000 UNITS CAPS Take by mouth.   diclofenac sodium (VOLTAREN) 1 % GEL Apply 2 g topically 4 (four) times daily.   escitalopram (LEXAPRO) 10 MG tablet TAKE 1 TABLET EVERY DAY   Vaginal Lubricant (REPLENS VA) Place vaginally every 3 (three) days.   [DISCONTINUED] sulfamethoxazole-trimethoprim (BACTRIM DS) 800-160 MG tablet Take 1 tablet by mouth 2 (two) times daily. Take for one week.   No facility-administered encounter medications on file as of 07/17/2021.    Allergies as of 07/17/2021 - Review Complete 07/17/2021  Allergen Reaction Noted   Gabapentin      Past Medical History:  Diagnosis Date   Anxiety    Cataract    Colonic polyp 2005 & 2013   DDD (degenerative disc disease)    Depression    GERD (gastroesophageal reflux  disease)    H/O echocardiogram    before 2000, no need for f/u /w cardiac    Heart murmur    MVP- echo- 2010, no symptoms    Memory disorder 09/19/2017   Mononucleosis 1971   Osteoporosis    Pinched nerve in neck    Sleep apnea     Past Surgical History:  Procedure Laterality Date   ANTERIOR CERVICAL DECOMP/DISCECTOMY FUSION N/A 05/20/2013   Procedure: ANTERIOR CERVICAL DECOMPRESSION/DISCECTOMY FUSION 2 LEVELS;  Surgeon: Erline Levine, MD;  Location: [facility];  Service: Neurosurgery;  Laterality: N/A;  Cervical Seven-Thoracic One, Thoracic One-Two Anterior cervical decompression/Diskectomy/Fusion   CARPAL TUNNEL RELEASE Right    CATARACT EXTRACTION, BILATERAL  2013   Dr Gershon Crane, [place]     2 proceduresr Vertell Limber   colonoscopy with polypectomy  2013   Dr Deatra Ina   FRACTURE SURGERY     L wrist - June 2014   HERNIA REPAIR     KNEE SURGERY     Dr Wonda Olds ; bursa cystectomy   LUMBAR DISC SURGERY     Dr.Stern   ROTATOR CUFF REPAIR     Dr.Wainer   UMBILICAL HERNIA REPAIR     WRIST FRACTURE SURGERY Left 02/2013   Dr. Kathryne Hitch    Family History  Problem Relation Age of Onset   Diabetes Father  Coronary artery disease Father        S/P CBAG , no MI -- Dec   Depression Father    Diverticulosis Father    Breast cancer Mother        estorgen receptor positive   Stroke Mother 60       Dec   Breast cancer Sister        estrogen receptor positive   Depression Sister    Depression Sister    Depression Sister    Depression Sister    Depression Brother    Bipolar disorder Other        Neice   Colon polyps Sister    Colon cancer Neg Hx    Esophageal cancer Neg Hx    Pancreatic cancer Neg Hx    Rectal cancer Neg Hx    Stomach cancer Neg Hx     Social History   Socioeconomic History   Marital status: Married    Spouse name: Not on file   Number of children: Not on file   Years of education: 16+   Highest education level: Not on file   Occupational History   Occupation: Pharmacist, hospital- Retired  Tobacco Use   Smoking status: Never   Smokeless tobacco: Never  Scientific laboratory technician Use: Never used  Substance and Sexual Activity   Alcohol use: No    Alcohol/week: 0.0 standard drinks   Drug use: No   Sexual activity: Not Currently    Partners: Male    Birth control/protection: Post-menopausal  Other Topics Concern   Not on file  Social History Narrative   Lives   Caffeine use:    Right handed    Social Determinants of Health   Financial Resource Strain: Low Risk    Difficulty of Paying Living Expenses: Not hard at all  Food Insecurity: No Food Insecurity   Worried About Charity fundraiser in the Last Year: Never true   [place] in the Last Year: Never true  Transportation Needs: No Transportation Needs   Lack of Transportation (Medical): No   Lack of Transportation (Non-Medical): No  Physical Activity: Insufficiently Active   Days of Exercise per Week: 3 days   Minutes of Exercise per Session: 30 min  Stress: No Stress Concern Present   Feeling of Stress : Not at all  Social Connections: Socially Integrated   Frequency of Communication with Friends and Family: More than three times a week   Frequency of Social Gatherings with Friends and Family: More than three times a week   Attends Religious Services: More than 4 times per year   Active Member of [employer] or Organizations: Yes   Attends Archivist Meetings: 1 to 4 times per year   Marital Status: Married  Human resources officer Violence: Not At Risk   Fear of Current or Ex-Partner: No   Emotionally Abused: No   Physically Abused: No   Sexually Abused: No    Review of Systems  Respiratory:  Positive for apnea.   Psychiatric/Behavioral:  Positive for sleep disturbance.    Vitals:   07/17/21 1129  BP: 112/70  Pulse: 99  Temp: 98 F (36.7 C)  SpO2: 99%     Physical Exam Constitutional:      Appearance: Normal appearance.  HENT:      Right Ear: Tympanic membrane normal.     Mouth/Throat:     Mouth: Mucous membranes are moist.     Comments: Mallampati 2, Mallampati  2 Cardiovascular:     Rate and Rhythm: Normal rate and regular rhythm.     Heart sounds: No murmur heard.   No friction rub.  Pulmonary:     Effort: No respiratory distress.     Breath sounds: No stridor. No wheezing or rhonchi.  Musculoskeletal:     Cervical back: No rigidity or tenderness.  Neurological:     Mental Status: She is alert.  Psychiatric:        Mood and Affect: Mood normal.   Results of the Epworth flowsheet 01/21/2017  Sitting and reading 3  Watching TV 2  Sitting, inactive in a public place (e.g. a theatre or a meeting) 1  As a passenger in a car for an hour without a break 1  Lying down to rest in the afternoon when circumstances permit 2  Sitting and talking to someone 0  Sitting quietly after a lunch without alcohol 0  In a car, while stopped for a few minutes in traffic 0  Total score 9    Data Reviewed: Previous sleep study reviewed showing an AHI of 16 Previous office visit with Dr. Halford Chessman reviewed  Assessment:  History of moderate obstructive sleep apnea  Was on CPAP therapy -Machine became dysfunctional  She was benefiting from CPAP use with improved daytime symptoms  Recurrence of symptoms while not on CPAP  Plan/Recommendations: Prescription to be sent to medical supply company for CPAP  Auto CPAP 5-15 with heated humidification  Tentative follow-up in a year  Encouraged patient to update [place] when she finally gets set up   Sherrilyn Rist MD [facility] 07/17/2021, 11:48 AM  CC: [name], [name], MD

## 2021-07-30 ENCOUNTER — Other Ambulatory Visit: Payer: Self-pay

## 2021-07-30 ENCOUNTER — Encounter: Payer: Self-pay | Admitting: Family Medicine

## 2021-07-30 ENCOUNTER — Ambulatory Visit: Payer: Medicare PPO | Admitting: Family Medicine

## 2021-07-30 VITALS — BP 120/70 | HR 92 | Resp 16 | Ht 59.0 in | Wt 129.0 lb

## 2021-07-30 DIAGNOSIS — K219 Gastro-esophageal reflux disease without esophagitis: Secondary | ICD-10-CM | POA: Diagnosis not present

## 2021-07-30 DIAGNOSIS — N1831 Chronic kidney disease, stage 3a: Secondary | ICD-10-CM

## 2021-07-30 DIAGNOSIS — D72829 Elevated white blood cell count, unspecified: Secondary | ICD-10-CM

## 2021-07-30 DIAGNOSIS — F325 Major depressive disorder, single episode, in full remission: Secondary | ICD-10-CM | POA: Diagnosis not present

## 2021-07-30 DIAGNOSIS — Z01818 Encounter for other preprocedural examination: Secondary | ICD-10-CM | POA: Diagnosis not present

## 2021-07-30 MED ORDER — OMEPRAZOLE 40 MG PO CPDR: 40.0000 mg | DELAYED_RELEASE_CAPSULE | Freq: Every day | ORAL | 1 refills | Status: DC

## 2021-07-30 NOTE — Progress Notes (Signed)
ACUTE VISIT Chief Complaint  Patient presents with   Pre-op Exam   HPI: [name] is a 70 y.o. female with history of OSA, OA, CKD 3, depression, and anemia here today for pre op evaluation requested by Dr Percell Miller. Planning on having right total hip replacement, date has not been established yet. Right hip pain getting worse, affecting daily activities.  No history of tobacco use, DM 2, CAD, CHF, or hypertension. Denies any complication during surgeries or recovery.  Mild OSA:She is not on CPAP because it "broke" and it is back order.  CKD III: Negative for gross hematuria, foam in urine, or decreased urine output.  Negative for CP, dyspnea, palpitation, dizziness, or diaphoresis with climbing a flight of stairs, walking a [place], or carrying heavy groceries.  Lab Results  Component Value Date   CREATININE 1.03 10/03/2020   BUN 19 10/03/2020   NA 141 10/03/2020   K 3.6 10/03/2020   CL 107 10/03/2020   CO2 29 10/03/2020   Past Surgical History:  Procedure Laterality Date   ANTERIOR CERVICAL DECOMP/DISCECTOMY FUSION N/A 05/20/2013   Procedure: ANTERIOR CERVICAL DECOMPRESSION/DISCECTOMY FUSION 2 LEVELS;  Surgeon: Erline Levine, MD;  Location: [facility];  Service: Neurosurgery;  Laterality: N/A;  Cervical Seven-Thoracic One, Thoracic One-Two Anterior cervical decompression/Diskectomy/Fusion   CARPAL TUNNEL RELEASE Right    CATARACT EXTRACTION, BILATERAL  2013   Dr Gershon Crane, [place]     2 proceduresr Vertell Limber   colonoscopy with polypectomy  2013   Dr Deatra Ina   FRACTURE SURGERY     L wrist - June 2014   HERNIA REPAIR     KNEE SURGERY     Dr Wonda Olds ; bursa cystectomy   LUMBAR DISC SURGERY     Dr.Stern   ROTATOR CUFF REPAIR     Dr.Wainer   UMBILICAL HERNIA REPAIR     WRIST FRACTURE SURGERY Left 02/2013   Dr. Kathryne Hitch   GERD:Heartburn for a few months and becoming more frequent. She has not identified exacerbating or alleviating factors. Denies  abdominal pain, nausea, vomiting, changes in bowel habits, blood in stool or melena.  She is taking tums. Symptoms are worse around bedtime.  Review of Systems  Constitutional:  Negative for activity change, appetite change and fever.  HENT:  Negative for mouth sores, nosebleeds and sore throat.   Eyes:  Negative for redness and visual disturbance.  Respiratory:  Negative for cough, shortness of breath and wheezing.   Cardiovascular:  Negative for chest pain, palpitations and leg swelling.  Endocrine: Negative for cold intolerance, heat intolerance, polydipsia, polyphagia and polyuria.  Genitourinary:  Negative for dysuria and frequency.  Musculoskeletal:  Positive for arthralgias and gait problem.  Skin:  Negative for rash.  Neurological:  Negative for syncope, weakness and headaches.  Rest see pertinent positives and negatives per HPI.  Current Outpatient Medications on File Prior to Visit  Medication Sig Dispense Refill   Acetaminophen (TYLENOL PO) Take by mouth.     Biotin 300 MCG TABS Take 300 mcg by mouth daily.     buPROPion (WELLBUTRIN XL) 300 MG 24 hr tablet TAKE 1 TABLET EVERY DAY 90 tablet 0   Calcium Carb-Cholecalciferol (CALCIUM 1000 + D PO) Take 600 mg by mouth daily.     Cholecalciferol (VITAMIN D) 2000 UNITS CAPS Take by mouth.     diclofenac sodium (VOLTAREN) 1 % GEL Apply 2 g topically 4 (four) times daily. 3 Tube 3   escitalopram (LEXAPRO)  10 MG tablet TAKE 1 TABLET EVERY DAY 90 tablet 0   Vaginal Lubricant (REPLENS VA) Place vaginally every 3 (three) days.     No current facility-administered medications on file prior to visit.   Past Medical History:  Diagnosis Date   Anxiety    Cataract    Colonic polyp 2005 & 2013   DDD (degenerative disc disease)    Depression    GERD (gastroesophageal reflux disease)    H/O echocardiogram    before 2000, no need for f/u /w cardiac    Heart murmur    MVP- echo- 2010, no symptoms    Memory disorder 09/19/2017    Mononucleosis 1971   Osteoporosis    Pinched nerve in neck    Sleep apnea    Allergies  Allergen Reactions   Gabapentin     REACTION: TOUNGE SWELLING, ITCHING    Social History   Socioeconomic History   Marital status: Married    Spouse name: Not on file   Number of children: Not on file   Years of education: 16+   Highest education level: Not on file  Occupational History   Occupation: Pharmacist, hospital- Retired  Tobacco Use   Smoking status: Never   Smokeless tobacco: Never  Scientific laboratory technician Use: Never used  Substance and Sexual Activity   Alcohol use: No    Alcohol/week: 0.0 standard drinks   Drug use: No   Sexual activity: Not Currently    Partners: Male    Birth control/protection: Post-menopausal  Other Topics Concern   Not on file  Social History Narrative   Lives   Caffeine use:    Right handed    Social Determinants of Health   Financial Resource Strain: Low Risk    Difficulty of Paying Living Expenses: Not hard at all  Food Insecurity: No Food Insecurity   Worried About Charity fundraiser in the Last Year: Never true   [place] in the Last Year: Never true  Transportation Needs: No Transportation Needs   Lack of Transportation (Medical): No   Lack of Transportation (Non-Medical): No  Physical Activity: Insufficiently Active   Days of Exercise per Week: 3 days   Minutes of Exercise per Session: 30 min  Stress: No Stress Concern Present   Feeling of Stress : Not at all  Social Connections: Socially Integrated   Frequency of Communication with Friends and Family: More than three times a week   Frequency of Social Gatherings with Friends and Family: More than three times a week   Attends Religious Services: More than 4 times per year   Active Member of [employer] or Organizations: Yes   Attends Archivist Meetings: 1 to 4 times per year   Marital Status: Married   Vitals:   07/30/21 1458  BP: 120/70  Pulse: 92  Resp: 16  SpO2: 97%    Body mass index is 26.05 kg/m.  Physical Exam Vitals and nursing note reviewed.  Constitutional:      General: She is not in acute distress.    Appearance: She is well-developed.  HENT:     Head: Normocephalic and atraumatic.     Mouth/Throat:     Mouth: Mucous membranes are moist.     Pharynx: Oropharynx is clear.  Eyes:     Conjunctiva/sclera: Conjunctivae normal.  Cardiovascular:     Rate and Rhythm: Normal rate and regular rhythm.     Pulses:  Dorsalis pedis pulses are 2+ on the right side and 2+ on the left side.     Heart sounds: No murmur heard. Pulmonary:     Effort: Pulmonary effort is normal. No respiratory distress.     Breath sounds: Normal breath sounds.  Abdominal:     Palpations: Abdomen is soft. There is no hepatomegaly or mass.     Tenderness: There is no abdominal tenderness.  Musculoskeletal:     Comments: Antalgic gait, not assisted.  Lymphadenopathy:     Cervical: No cervical adenopathy.  Skin:    General: Skin is warm.     Findings: No erythema or rash.  Neurological:     General: No focal deficit present.     Mental Status: She is alert and oriented to person, place, and time.     Cranial Nerves: No cranial nerve deficit.     Gait: Gait normal.  Psychiatric:     Comments: Well groomed, good eye contact.   ASSESSMENT AND PLAN:  Jessica Casey was seen today for pre-op exam.  Diagnoses and all orders for this visit: Orders Placed This Encounter  Procedures   CBC   Comprehensive metabolic panel   Microalbumin / creatinine urine ratio   CBC with Differential/Platelet   EKG 12-Lead   Lab Results  Component Value Date   WBC 13.6 (H) 07/30/2021   HGB 11.6 (L) 07/30/2021   HCT 35.7 (L) 07/30/2021   MCV 104.0 (H) 07/30/2021   PLT 310.0 07/30/2021   Lab Results  Component Value Date   CREATININE 0.98 07/30/2021   BUN 21 07/30/2021   NA 142 07/30/2021   K 4.2 07/30/2021   CL 105 07/30/2021   CO2 30 07/30/2021   Lab Results   Component Value Date   MICROALBUR 54.6 (H) 07/30/2021   MICROALBUR 1.6 08/06/2016   Pre-op examination Chronic medical problems stable. Recommend avoiding NSAIDs, Aspirin, and OTC supplements for at least 7 days before surgery. No medication day of surgery, she can take her Wellbutrin and Lexapro later after surgery. For DVT prophylaxis:Early ambulation, consider oral anticoagulation. EKG today:SR,normal axis and intervals, no ST or T abnormalities.No significant changes when compared with EKG in 07/2016. Labs ordered today. She is really hoping that right hip replacement improved quality of life, so cleared for surgery. Copy of note and completed clearance form will be faxed to Dr. Archer Asa office.  Gastroesophageal reflux disease, unspecified whether esophagitis present Recommend omeprazole 40 mg 30 minutes before breakfast for 6 to 8 weeks, then she can continue omeprazole 20 mg daily as needed. GERD precautions also recommended. If symptoms have not greatly improved, GI evaluation may be necessary.  -     omeprazole (PRILOSEC) 40 MG capsule; Take 1 capsule (40 mg total) by mouth daily.  Stage 3a chronic kidney disease ([place]) Problem has been stable: Cr 1.03-1.1 and e GFR mid 50's. No history of hypertension or diabetes. Chronic NSAID use could be the cause. Adequate hydration, low-salt diet, and avoidance of NSAIDs.  Depression, major, in remission ([place]) On Lexapro and Wellbutrin XL. Following with psychiatrist.  Leukocytosis, unspecified type -     CBC with Differential/Platelet; Future  Return if symptoms worsen or fail to improve.   Jessica Fewell G. [name], MD  [facility]. [place] office.

## 2021-07-30 NOTE — Patient Instructions (Addendum)
A few things to remember from today's visit:  Pre-op examination - Plan: EKG 12-Lead  Gastroesophageal reflux disease, unspecified whether esophagitis present  Stage 3a chronic kidney disease ([place])  If you need refills please call your pharmacy. Do not use My Chart to request refills or for acute issues that need immediate attention.   Omeprazole 30 min before breakfast for 6-8 weeks then over the counter 20 mg as needed. No medications date of surgery. No supplements for at least a week. Early ambulation. Avoid Ibuprofen and Ibuprofen, Tylenol is preferable for pain in general. Miralax daily as needed after surgery due to possible side effects of pain meds.  Please be sure medication list is accurate. If a new problem present, please set up appointment sooner than planned today.

## 2021-07-31 LAB — COMPREHENSIVE METABOLIC PANEL
ALT: 22 U/L (ref 0–35)
AST: 28 U/L (ref 0–37)
Albumin: 3.9 g/dL (ref 3.5–5.2)
Alkaline Phosphatase: 70 U/L (ref 39–117)
BUN: 21 mg/dL (ref 6–23)
CO2: 30 mEq/L (ref 19–32)
Calcium: 9.4 mg/dL (ref 8.4–10.5)
Chloride: 105 mEq/L (ref 96–112)
Creatinine, Ser: 0.98 mg/dL (ref 0.40–1.20)
GFR: 58.13 mL/min — ABNORMAL LOW (ref >60.00)
Glucose, Bld: 89 mg/dL (ref 70–99)
Potassium: 4.2 mEq/L (ref 3.5–5.1)
Sodium: 142 mEq/L (ref 135–145)
Total Bilirubin: 0.3 mg/dL (ref 0.2–1.2)
Total Protein: 6.6 g/dL (ref 6.0–8.3)

## 2021-07-31 LAB — CBC
HCT: 35.7 % — ABNORMAL LOW (ref 36.0–46.0)
Hemoglobin: 11.6 g/dL — ABNORMAL LOW (ref 12.0–15.0)
MCHC: 32.4 g/dL (ref 30.0–36.0)
MCV: 104 fl — ABNORMAL HIGH (ref 78.0–100.0)
Platelets: 310 10*3/uL (ref 150.0–400.0)
RBC: 3.43 Mil/uL — ABNORMAL LOW (ref 3.87–5.11)
RDW: 14.6 % (ref 11.5–15.5)
WBC: 13.6 10*3/uL — ABNORMAL HIGH (ref 4.0–10.5)

## 2021-07-31 LAB — MICROALBUMIN / CREATININE URINE RATIO
Creatinine,U: 256.2 mg/dL
Microalb Creat Ratio: 21.3 mg/g (ref 0.0–30.0)
Microalb, Ur: 54.6 mg/dL — ABNORMAL HIGH (ref 0.0–1.9)

## 2021-08-10 ENCOUNTER — Telehealth: Payer: Self-pay | Admitting: Family Medicine

## 2021-08-10 DIAGNOSIS — K219 Gastro-esophageal reflux disease without esophagitis: Secondary | ICD-10-CM

## 2021-08-10 MED ORDER — ESCITALOPRAM OXALATE 10 MG PO TABS: 10.0000 mg | ORAL_TABLET | Freq: Every day | ORAL | 0 refills | Status: DC

## 2021-08-10 MED ORDER — BUPROPION HCL ER (XL) 300 MG PO TB24: 300.0000 mg | ORAL_TABLET | Freq: Every day | ORAL | 0 refills | Status: DC

## 2021-08-10 MED ORDER — OMEPRAZOLE 40 MG PO CPDR: 40.0000 mg | DELAYED_RELEASE_CAPSULE | Freq: Every day | ORAL | 0 refills | Status: DC

## 2021-08-10 NOTE — Telephone Encounter (Signed)
Patient called because she has left her medication at home and she is currently away. She will need a four day supply. Patient can be contacted through mychart.  omeprazole (PRILOSEC) 40 MG capsule  buPROPion (WELLBUTRIN XL) 300 MG 24 hr tablet  escitalopram (LEXAPRO) 10 MG tablet  Please send to [facility] at [address] [place], [place] [address]     Please advise

## 2021-08-10 NOTE — Telephone Encounter (Signed)
Rx sent in

## 2021-08-13 DIAGNOSIS — M25551 Pain in right hip: Secondary | ICD-10-CM | POA: Diagnosis not present

## 2021-08-22 NOTE — Patient Instructions (Addendum)
DUE TO COVID-19 ONLY ONE VISITOR IS ALLOWED TO COME WITH YOU AND STAY IN THE WAITING ROOM ONLY DURING PRE OP AND PROCEDURE DAY OF SURGERY IF YOU ARE GOING HOME AFTER SURGERY. IF YOU ARE SPENDING THE NIGHT 2 PEOPLE MAY VISIT WITH YOU IN YOUR PRIVATE ROOM AFTER SURGERY UNTIL VISITING  HOURS ARE OVER AT 800 PM AND 1  VISITOR  MAY  SPEND THE NIGHT.   YOU NEED TO HAVE A COVID 19 TEST ON__12/2__THIS TEST MUST BE DONE BEFORE SURGERY,  COVID TESTING SITE  IS LOCATED AT [place], [place]. REMAIN IN YOUR CAR THIS IS A DRIVE UP TEST. AFTER YOUR COVID TEST PLEASE WEAR A MASK OUT IN PUBLIC AND SOCIAL DISTANCE AND [place] YOUR HANDS FREQUENTLY, ALSO ASK ALL YOUR CLOSE CONTACT PERSONS TO WEAR A MASK AND SOCIAL DISTANCE AND [place] THEIR HANDS FREQUENTLY ALSO.               Jessica Casey     Your procedure is scheduled on: 09/04/21   Report to [facility] Main  Entrance   Report to admitting at  7:55 AM     Call this number if you have problems the morning of surgery [phone]    No food after midnight.    You may have clear liquid until 7:00 AM.    At 6:30 AM drink pre surgery drink  . Nothing by mouth after 7:00 AM.   CLEAR LIQUID DIET   Foods Allowed                                                                     Foods Excluded  Coffee and tea, regular and decaf                             liquids that you cannot  Plain Jell-O any favor except red or purple                                           see through such as: Fruit ices (not with fruit pulp)                                     milk, soups, orange juice  Iced Popsicles                                    All solid food Carbonated beverages, regular and diet                                    Cranberry, grape and apple juices Sports drinks like Gatorade Lightly seasoned clear broth or consume(fat free) Sugar    BRUSH YOUR TEETH MORNING OF SURGERY AND RINSE YOUR MOUTH OUT, NO CHEWING GUM CANDY OR  MINTS.     Take these medicines the morning of surgery with A  SIP OF WATER: Bupropion, Escitalopram, Omeprazole Bring your mask and tubing                               You may not have any metal on your body including hair pins and              piercings  Do not wear jewelry, make-up, lotions, powders or perfumes, deodorant             Do not wear nail polish on your fingernails.  Do not shave  48 hours prior to surgery.                Do not bring valuables to the hospital. [place].  Contacts, dentures or bridgework may not be worn into surgery.  Leave suitcase in the car. After surgery it may be brought to your room.                 [place] - Preparing for Surgery Before surgery, you can play an important role.  Because skin is not sterile, your skin needs to be as free of germs as possible.  You can reduce the number of germs on your skin by washing with CHG (chlorahexidine gluconate) soap before surgery.  CHG is an antiseptic cleaner which kills germs and bonds with the skin to continue killing germs even after washing. Please DO NOT use if you have an allergy to CHG or antibacterial soaps.  If your skin becomes reddened/irritated stop using the CHG and inform your nurse when you arrive at Short Stay. Do not shave (including legs and underarms) for at least 48 hours prior to the first CHG shower.   Please follow these instructions carefully:  1.  Shower with CHG Soap the night before surgery and the  morning of Surgery.  2.  If you choose to wash your hair, wash your hair first as usual with your  normal  shampoo.  3.  After you shampoo, rinse your hair and body thoroughly to remove the  shampoo.                            4.  Use CHG as you would any other liquid soap.  You can apply chg directly  to the skin and wash                       Gently with a scrungie or clean washcloth.  5.  Apply the CHG Soap to your body ONLY  FROM THE NECK DOWN.   Do not use on face/ open                           Wound or open sores. Avoid contact with eyes, ears mouth and genitals (private parts).                       Wash face,  Genitals (private parts) with your normal soap.             6.  Wash thoroughly, paying special attention to the area where your surgery  will be performed.  7.  Thoroughly rinse your body with warm water from the neck down.  8.  DO NOT shower/wash with your normal soap after using and rinsing off  the CHG Soap.                9.  Pat yourself dry with a clean towel.            10.  Wear clean pajamas.            11.  Place clean sheets on your bed the night of your first shower and do not  sleep with pets. Day of Surgery : Do not apply any lotions/deodorants the morning of surgery.  Please wear clean clothes to the hospital/surgery center.  FAILURE TO FOLLOW THESE INSTRUCTIONS MAY RESULT IN THE CANCELLATION OF YOUR SURGERY PATIENT SIGNATURE_________________________________  NURSE SIGNATURE__________________________________  ________________________________________________________________________   Adam Phenix  An incentive spirometer is a tool that can help keep your lungs clear and active. This tool measures how well you are filling your lungs with each breath. Taking long deep breaths may help reverse or decrease the chance of developing breathing (pulmonary) problems (especially infection) following: A long period of time when you are unable to move or be active. BEFORE THE PROCEDURE  If the spirometer includes an indicator to show your best effort, your nurse or respiratory therapist will set it to a desired goal. If possible, sit up straight or lean slightly forward. Try not to slouch. Hold the incentive spirometer in an upright position. INSTRUCTIONS FOR USE  Sit on the edge of your bed if possible, or sit up as far as you can in bed or on a chair. Hold the incentive spirometer in  an upright position. Breathe out normally. Place the mouthpiece in your mouth and seal your lips tightly around it. Breathe in slowly and as deeply as possible, raising the piston or the ball toward the top of the column. Hold your breath for 3-5 seconds or for as long as possible. Allow the piston or ball to fall to the bottom of the column. Remove the mouthpiece from your mouth and breathe out normally. Rest for a few seconds and repeat Steps 1 through 7 at least 10 times every 1-2 hours when you are awake. Take your time and take a few normal breaths between deep breaths. The spirometer may include an indicator to show your best effort. Use the indicator as a goal to work toward during each repetition. After each set of 10 deep breaths, practice coughing to be sure your lungs are clear. If you have an incision (the cut made at the time of surgery), support your incision when coughing by placing a pillow or rolled up towels firmly against it. Once you are able to get out of bed, walk around indoors and cough well. You may stop using the incentive spirometer when instructed by your caregiver.  RISKS AND COMPLICATIONS Take your time so you do not get dizzy or light-headed. If you are in pain, you may need to take or ask for pain medication before doing incentive spirometry. It is harder to take a deep breath if you are having pain. AFTER USE Rest and breathe slowly and easily. It can be helpful to keep track of a log of your progress. Your caregiver can provide you with a simple table to help with this. If you are using the spirometer at home, follow these instructions: [place] IF:  You are having difficultly using the spirometer. You have trouble using the spirometer as often as instructed. Your pain medication  is not giving enough relief while using the spirometer. You develop fever of 100.5 F (38.1 C) or higher. SEEK IMMEDIATE MEDICAL CARE IF:  You cough up bloody sputum that  had not been present before. You develop fever of 102 F (38.9 C) or greater. You develop worsening pain at or near the incision site. MAKE SURE YOU:  Understand these instructions. Will watch your condition. Will get help right away if you are not doing well or get worse. Document Released: 01/27/2007 Document Revised: 12/09/2011 Document Reviewed: 03/30/2007 [facility] Patient Information 2014 [employer], [place].   ________________________________________________________________________ pcr

## 2021-08-25 ENCOUNTER — Other Ambulatory Visit: Payer: Self-pay | Admitting: Family Medicine

## 2021-08-25 DIAGNOSIS — K219 Gastro-esophageal reflux disease without esophagitis: Secondary | ICD-10-CM

## 2021-08-27 NOTE — Care Plan (Signed)
Ortho Bundle Case Management Note  Patient Details  Name: Jessica Casey MRN: [id number] Date of Birth: 12-Jan-1951  Met with patient in the office for H&P visit. She will discharge to home with family to assist. Rolling walker ordered for home. OPPT set up with [facility]. Patient and MD in agreement with plan. Choice offered                    DME Arranged:  Walker rolling DME Agency:  [employer]  HH Arranged:    [place] Agency:     Additional Comments: Please contact me with any questions of if this plan should need to change.  Ladell Heads,  [place] Specialist  [phone] 08/27/2021, 10:42 AM

## 2021-08-29 ENCOUNTER — Encounter (HOSPITAL_COMMUNITY)
Admission: RE | Admit: 2021-08-29 | Discharge: 2021-08-29 | Disposition: A | Discharge status: Home / Self Care | Payer: Medicare PPO | Source: Ambulatory Visit | Attending: Orthopedic Surgery | Admitting: Orthopedic Surgery

## 2021-08-29 ENCOUNTER — Encounter (HOSPITAL_COMMUNITY): Payer: Self-pay

## 2021-08-29 ENCOUNTER — Other Ambulatory Visit: Payer: Self-pay

## 2021-08-29 VITALS — BP 129/72 | HR 81 | Temp 98.5°F | Resp 18 | Ht 59.0 in | Wt 126.0 lb

## 2021-08-29 DIAGNOSIS — Z01812 Encounter for preprocedural laboratory examination: Secondary | ICD-10-CM | POA: Insufficient documentation

## 2021-08-29 DIAGNOSIS — D649 Anemia, unspecified: Secondary | ICD-10-CM | POA: Insufficient documentation

## 2021-08-29 DIAGNOSIS — Z01818 Encounter for other preprocedural examination: Secondary | ICD-10-CM

## 2021-08-29 LAB — CBC
HCT: 36.3 % (ref 36.0–46.0)
Hemoglobin: 11.6 g/dL — ABNORMAL LOW (ref 12.0–15.0)
MCH: 33.7 pg (ref 26.0–34.0)
MCHC: 32 g/dL (ref 30.0–36.0)
MCV: 105.5 fL — ABNORMAL HIGH (ref 80.0–100.0)
Platelets: 351 10*3/uL (ref 150–400)
RBC: 3.44 MIL/uL — ABNORMAL LOW (ref 3.87–5.11)
RDW: 14 % (ref 11.5–15.5)
WBC: 13.2 10*3/uL — ABNORMAL HIGH (ref 4.0–10.5)
nRBC: 0 % (ref 0.0–0.2)

## 2021-08-29 LAB — BASIC METABOLIC PANEL
Anion gap: 6 (ref 5–15)
BUN: 16 mg/dL (ref 8–23)
CO2: 27 mmol/L (ref 22–32)
Calcium: 9.1 mg/dL (ref 8.9–10.3)
Chloride: 105 mmol/L (ref 98–111)
Creatinine, Ser: 0.85 mg/dL (ref 0.44–1.00)
GFR, Estimated: >60 mL/min (ref >60)
Glucose, Bld: 89 mg/dL (ref 70–99)
Potassium: 4.3 mmol/L (ref 3.5–5.1)
Sodium: 138 mmol/L (ref 135–145)

## 2021-08-29 LAB — SURGICAL PCR SCREEN
MRSA, PCR: NEGATIVE
Staphylococcus aureus: NEGATIVE

## 2021-08-29 NOTE — Progress Notes (Signed)
COVID test- 12/2   PCP - Dr. B. [name] Cardiologist - none  Chest x-ray - no EKG - 07/30/21-epic Stress Test - no ECHO - 2017 Cardiac Cath - NA Pacemaker/ICD device last checked:NA  Sleep Study - yes CPAP - not at this time . Her machine is broken and on back order.  Fasting Blood Sugar - NA Checks Blood Sugar _____ times a day  Blood Thinner Instructions:NA Aspirin Instructions: Last Dose:  Anesthesia review: yes  Patient denies shortness of breath, fever, cough and chest pain at PAT appointment Pt reports no SOB with any activities.  Patient verbalized understanding of instructions that were given to them at the PAT appointment. Patient was also instructed that they will need to review over the PAT instructions again at home before surgery. yes

## 2021-08-31 ENCOUNTER — Other Ambulatory Visit: Payer: Self-pay | Admitting: Orthopedic Surgery

## 2021-08-31 LAB — SARS CORONAVIRUS 2 (TAT 6-24 HRS): SARS Coronavirus 2: NEGATIVE

## 2021-08-31 NOTE — H&P (Signed)
HIP ARTHROPLASTY ADMISSION H&P  Patient ID: Jessica Casey MRN: [id number] DOB/AGE: Oct 28, 1950 70 y.o.  Chief Complaint: right hip pain.  Planned Procedure Date: 09/04/21 Medical and Cardiac Clearance by Dr. Betty [name]     HPI: Jessica Casey is a 70 y.o. female who presents for evaluation of OSTEOARTHRITIS  RIGHT HIP. The patient has a history of pain and functional disability in the right hip due to arthritis and has failed non-surgical conservative treatments for greater than 12 weeks to include NSAID's and/or analgesics, corticosteriod injections, and activity modification.  Onset of symptoms was abrupt, starting  6 months ago with rapidlly worsening course since that time. The patient noted no past surgery on the right hip.  Patient currently rates pain at 10 out of 10 with activity. Patient has night pain, worsening of pain with activity and weight bearing, and pain that interferes with activities of daily living.  Patient has evidence of subchondral sclerosis, periarticular osteophytes, and joint space narrowing by imaging studies.  There is no active infection.  Past Medical History:  Diagnosis Date   Anxiety    Cataract    Colonic polyp 2005 & 2013   DDD (degenerative disc disease)    Depression    GERD (gastroesophageal reflux disease)    H/O echocardiogram    before 2000, no need for f/u /w cardiac    Heart murmur    MVP- echo- 2010, no symptoms    Memory disorder 09/19/2017   Mononucleosis 1971   Osteoporosis    Sleep apnea    Past Surgical History:  Procedure Laterality Date   ANTERIOR CERVICAL DECOMP/DISCECTOMY FUSION N/A 05/20/2013   Procedure: ANTERIOR CERVICAL DECOMPRESSION/DISCECTOMY FUSION 2 LEVELS;  Surgeon: Erline Levine, MD;  Location: [facility];  Service: Neurosurgery;  Laterality: N/A;  Cervical Seven-Thoracic One, Thoracic One-Two Anterior cervical decompression/Diskectomy/Fusion   CARPAL TUNNEL RELEASE Right    CATARACT EXTRACTION, BILATERAL   10/01/2011   Dr Gershon Crane, Viona Gilmore IOL   CERVICAL FUSION     2 proceduresr Vertell Limber   colonoscopy with polypectomy  10/01/2011   Dr Deatra Ina   FRACTURE SURGERY     L wrist - June 2014   KNEE SURGERY     Dr Wonda Olds ; bursa cystectomy   LUMBAR DISC SURGERY     Dr.Stern   ROTATOR CUFF REPAIR     Dr.Wainer   UMBILICAL HERNIA REPAIR     as a baby   WRIST FRACTURE SURGERY Left 02/28/2013   Dr. Kathryne Hitch   Allergies  Allergen Reactions   Gabapentin     Tongue swelling and itching    Prior to Admission medications   Medication Sig Start Date End Date Taking? Authorizing Provider  acetaminophen (TYLENOL) 650 MG CR tablet Take 1,300 mg by mouth every 8 (eight) hours as needed for pain.   Yes [provider]  BIOTIN PO Take 1 tablet by mouth daily.   Yes [provider]  buPROPion (WELLBUTRIN XL) 300 MG 24 hr tablet Take 1 tablet (300 mg total) by mouth daily. 08/10/21  Yes [name], [name], MD  Calcium Carb-Cholecalciferol (CALCIUM 600 + D PO) Take 1 tablet by mouth daily.   Yes [provider]  Cholecalciferol (VITAMIN D) 2000 UNITS CAPS Take 2,000 Units by mouth daily.   Yes [provider]  diclofenac sodium (VOLTAREN) 1 % GEL Apply 2 g topically 4 (four) times daily. Patient taking differently: Apply 2 g topically 4 (four) times daily as needed (pain). 05/01/18  Yes Koberlein, Junell C, MD  escitalopram (LEXAPRO) 10 MG tablet Take 1 tablet (10 mg total) by mouth daily. 08/10/21  Yes [name], [name], MD  Melatonin 10 MG TABS Take 10 mg by mouth at bedtime as needed (sleep).   Yes [provider]  Multiple Vitamin (MULTIVITAMIN WITH MINERALS) TABS tablet Take 1 tablet by mouth daily.   Yes [provider]  PE-DM-GG-APAP&PE-Doxyl-DM-APAP (VICKS DAYQUIL/NYQUIL SEVERE) LQPK Take 15 mLs by mouth 2 (two) times daily as needed (congestion / cold symptoms).   Yes [provider]  Vaginal Lubricant (REPLENS VA) Place 1 application vaginally  daily as needed (dryness).   Yes [provider]  omeprazole (PRILOSEC) 40 MG capsule TAKE 1 CAPSULE (40 MG TOTAL) BY MOUTH DAILY. 08/27/21   [name], [name], MD   Social History   Socioeconomic History   Marital status: Married    Spouse name: Not on file   Number of children: Not on file   Years of education: 16+   Highest education level: Not on file  Occupational History   Occupation: Pharmacist, hospital- Retired  Tobacco Use   Smoking status: Never   Smokeless tobacco: Never  Vaping Use   Vaping Use: Never used  Substance and Sexual Activity   Alcohol use: No    Alcohol/week: 0.0 standard drinks   Drug use: No   Sexual activity: Not Currently    Partners: Male    Birth control/protection: Post-menopausal  Other Topics Concern   Not on file  Social History Narrative   Lives   Caffeine use:    Right handed    Social Determinants of Health   Financial Resource Strain: Low Risk    Difficulty of Paying Living Expenses: Not hard at all  Food Insecurity: No Food Insecurity   Worried About Charity fundraiser in the Last Year: Never true   [place] in the Last Year: Never true  Transportation Needs: No Transportation Needs   Lack of Transportation (Medical): No   Lack of Transportation (Non-Medical): No  Physical Activity: Insufficiently Active   Days of Exercise per Week: 3 days   Minutes of Exercise per Session: 30 min  Stress: No Stress Concern Present   Feeling of Stress : Not at all  Social Connections: Socially Integrated   Frequency of Communication with Friends and Family: More than three times a week   Frequency of Social Gatherings with Friends and Family: More than three times a week   Attends Religious Services: More than 4 times per year   Active Member of Clubs or Organizations: Yes   Attends Archivist Meetings: 1 to 4 times per year   Marital Status: Married   Family History  Problem Relation Age of Onset   Diabetes Father     Coronary artery disease Father        S/P CBAG , no MI -- Dec   Depression Father    Diverticulosis Father    Breast cancer Mother        estorgen receptor positive   Stroke Mother 75       Dec   Breast cancer Sister        estrogen receptor positive   Depression Sister    Depression Sister    Depression Sister    Depression Sister    Depression Brother    Bipolar disorder Other        Neice   Colon polyps Sister    Colon cancer  Neg Hx    Esophageal cancer Neg Hx    Pancreatic cancer Neg Hx    Rectal cancer Neg Hx    Stomach cancer Neg Hx     ROS: Currently denies lightheadedness, dizziness, Fever, chills, CP, SOB.   No personal history of DVT, PE, MI, or CVA. No loose teeth or dentures All other systems have been reviewed and were otherwise currently negative with the exception of those mentioned in the HPI and as above.  Objective: Vitals: Ht: 4'10" Wt: 127.9 lbs Temp: 97.8 BP: 135/78 Pulse: 103 O2 99% on room air.   Physical Exam: General: Alert, NAD. Trendelenberg Gait  HEENT: EOMI, Good Neck Extension  Pulm: No increased work of breathing.  Clear B/L A/P w/o crackle or wheeze.  CV: RRR, No m/g/r appreciated  GI: soft, NT, ND. BS x 4 quadrants Neuro: CN II-XII grossly intact without focal deficit.  Sensation intact distally Skin: No lesions in the area of chief complaint MSK/Surgical Site: + TTP lateral aspect of right hip and groin. Hip pain with ROM. + Stinchfield. + SLR. + FABER/FADIR. Decreased strength.  NVI.    Imaging Review Plain radiographs demonstrate moderate degenerative joint disease of the right hip.   The bone quality appears to be fair for age and reported activity level.  Preoperative templating of the joint replacement has been completed, documented, and submitted to the Operating Room personnel in order to optimize intra-operative equipment management.  Assessment: OSTEOARTHRITIS  RIGHT HIP Active Problems:   * No active hospital problems.  *   Plan: Plan for Procedure(s): TOTAL HIP ARTHROPLASTY ANTERIOR APPROACH  The patient history, physical exam, clinical judgement of the provider and imaging are consistent with end stage degenerative joint disease and total joint arthroplasty is deemed medically necessary. The treatment options including medical management, injection therapy, and arthroplasty were discussed at length. The risks and benefits of Procedure(s): TOTAL HIP ARTHROPLASTY ANTERIOR APPROACH were presented and reviewed.  The risks of nonoperative treatment, versus surgical intervention including but not limited to continued pain, aseptic loosening, stiffness, dislocation/subluxation, infection, bleeding, nerve injury, blood clots, cardiopulmonary complications, morbidity, mortality, among others were discussed. The patient verbalizes understanding and wishes to proceed with the plan.  Patient is being admitted for surgery, pain control, PT, prophylactic antibiotics, VTE prophylaxis, progressive ambulation, ADL's and discharge planning. She will spend the night in observation.   Dental prophylaxis discussed and recommended for 2 years postoperatively.  The patient does meet the criteria for TXA which will be used perioperatively.   ASA 81 mg BID will be used postoperatively for DVT prophylaxis in addition to SCDs, and early ambulation. She already has enough ASA at home and doesn't need me to send more. Plan for Oxycodone, Celebrex, Tylenol for pain.   Robaxin for muscle spasm.  Zofran for nausea and vomiting. Pharmacy- [facility] The patient is planning to be discharged home with OPPT and into the care of her husband Dominica Severin who can be reached at [phone] Follow up appt 09/19/21 at 4:15pm    Alisa Graff Office [phone] 08/31/2021 2:04 PM

## 2021-09-03 DIAGNOSIS — Z85828 Personal history of other malignant neoplasm of skin: Secondary | ICD-10-CM | POA: Diagnosis not present

## 2021-09-03 DIAGNOSIS — X32XXXD Exposure to sunlight, subsequent encounter: Secondary | ICD-10-CM | POA: Diagnosis not present

## 2021-09-03 DIAGNOSIS — Z08 Encounter for follow-up examination after completed treatment for malignant neoplasm: Secondary | ICD-10-CM | POA: Diagnosis not present

## 2021-09-03 DIAGNOSIS — L570 Actinic keratosis: Secondary | ICD-10-CM | POA: Diagnosis not present

## 2021-09-03 NOTE — Progress Notes (Signed)
Pt aware to arrive at [facility] admitting at 1010 on Tuesday 09/04/2021 for scheduled surgical procedure. No food after midnight; clear liquids from midnight till 0930 consuming entire pre surgery drink by 0930 then nothing by mouth.

## 2021-09-04 ENCOUNTER — Ambulatory Visit (HOSPITAL_COMMUNITY): Payer: Medicare PPO | Admitting: Anesthesiology

## 2021-09-04 ENCOUNTER — Ambulatory Visit (HOSPITAL_COMMUNITY): Payer: Medicare PPO

## 2021-09-04 ENCOUNTER — Encounter (HOSPITAL_COMMUNITY)
Admission: RE | Disposition: A | Discharge status: Home / Self Care | Payer: Self-pay | Source: Home / Self Care | Attending: Orthopedic Surgery

## 2021-09-04 ENCOUNTER — Ambulatory Visit (HOSPITAL_COMMUNITY): Payer: Medicare PPO | Admitting: Physician Assistant

## 2021-09-04 ENCOUNTER — Other Ambulatory Visit: Payer: Self-pay

## 2021-09-04 ENCOUNTER — Encounter (HOSPITAL_COMMUNITY): Payer: Self-pay | Admitting: Orthopedic Surgery

## 2021-09-04 ENCOUNTER — Inpatient Hospital Stay (HOSPITAL_COMMUNITY)
Admission: RE | Admit: 2021-09-04 | Discharge: 2021-09-07 | DRG: 470 | Disposition: A | Discharge status: Home / Self Care | Payer: Medicare PPO | Attending: Orthopedic Surgery | Admitting: Orthopedic Surgery

## 2021-09-04 DIAGNOSIS — Z79899 Other long term (current) drug therapy: Secondary | ICD-10-CM

## 2021-09-04 DIAGNOSIS — K219 Gastro-esophageal reflux disease without esophagitis: Secondary | ICD-10-CM | POA: Diagnosis present

## 2021-09-04 DIAGNOSIS — Z888 Allergy status to other drugs, medicaments and biological substances status: Secondary | ICD-10-CM | POA: Diagnosis not present

## 2021-09-04 DIAGNOSIS — G4730 Sleep apnea, unspecified: Secondary | ICD-10-CM | POA: Diagnosis present

## 2021-09-04 DIAGNOSIS — S72041A Displaced fracture of base of neck of right femur, initial encounter for closed fracture: Secondary | ICD-10-CM

## 2021-09-04 DIAGNOSIS — Z471 Aftercare following joint replacement surgery: Secondary | ICD-10-CM | POA: Diagnosis not present

## 2021-09-04 DIAGNOSIS — M810 Age-related osteoporosis without current pathological fracture: Secondary | ICD-10-CM | POA: Diagnosis present

## 2021-09-04 DIAGNOSIS — M79651 Pain in right thigh: Secondary | ICD-10-CM

## 2021-09-04 DIAGNOSIS — F32A Depression, unspecified: Secondary | ICD-10-CM | POA: Diagnosis present

## 2021-09-04 DIAGNOSIS — Z96641 Presence of right artificial hip joint: Secondary | ICD-10-CM | POA: Diagnosis not present

## 2021-09-04 DIAGNOSIS — M1611 Unilateral primary osteoarthritis, right hip: Principal | ICD-10-CM | POA: Diagnosis present

## 2021-09-04 DIAGNOSIS — Z981 Arthrodesis status: Secondary | ICD-10-CM | POA: Diagnosis not present

## 2021-09-04 DIAGNOSIS — Z9889 Other specified postprocedural states: Secondary | ICD-10-CM | POA: Diagnosis not present

## 2021-09-04 HISTORY — PX: TOTAL HIP ARTHROPLASTY: SHX124

## 2021-09-04 LAB — TYPE AND SCREEN
ABO/RH(D): A POS
Antibody Screen: NEGATIVE

## 2021-09-04 SURGERY — ARTHROPLASTY, HIP, TOTAL, ANTERIOR APPROACH
Anesthesia: Spinal | Site: Hip | Laterality: Right

## 2021-09-04 MED ORDER — METOCLOPRAMIDE HCL 5 MG PO TABS: 5.0000 mg | ORAL_TABLET | Freq: Three times a day (TID) | ORAL | Status: DC | PRN

## 2021-09-04 MED ORDER — ASPIRIN 81 MG PO CHEW
81.0000 mg | CHEWABLE_TABLET | Freq: Two times a day (BID) | ORAL | Status: DC
  Administered 2021-09-04 – 2021-09-07 (×6): 81 mg via ORAL
  Filled 2021-09-04 (×6): qty 1

## 2021-09-04 MED ORDER — SODIUM CHLORIDE FLUSH 0.9 % IV SOLN
INTRAVENOUS | Status: DC | PRN
  Administered 2021-09-04: 10 mL

## 2021-09-04 MED ORDER — PHENYLEPHRINE HCL-NACL 20-0.9 MG/250ML-% IV SOLN
INTRAVENOUS | Status: DC | PRN
  Administered 2021-09-04: 25 ug/min via INTRAVENOUS

## 2021-09-04 MED ORDER — OXYCODONE HCL 5 MG PO TABS
10.0000 mg | ORAL_TABLET | ORAL | Status: DC | PRN
  Administered 2021-09-05 (×2): 10 mg via ORAL
  Administered 2021-09-05: 15 mg via ORAL
  Administered 2021-09-05: 10 mg via ORAL
  Administered 2021-09-06: 15 mg via ORAL
  Filled 2021-09-04: qty 2
  Filled 2021-09-04 (×2): qty 3

## 2021-09-04 MED ORDER — BUPIVACAINE LIPOSOME 1.3 % IJ SUSP
INTRAMUSCULAR | Status: DC | PRN
  Administered 2021-09-04: 10 mL

## 2021-09-04 MED ORDER — ACETAMINOPHEN 325 MG PO TABS: 325.0000 mg | ORAL_TABLET | Freq: Four times a day (QID) | ORAL | Status: DC | PRN

## 2021-09-04 MED ORDER — PROPOFOL 500 MG/50ML IV EMUL
INTRAVENOUS | Status: DC | PRN
  Administered 2021-09-04: 75 ug/kg/min via INTRAVENOUS

## 2021-09-04 MED ORDER — ACETAMINOPHEN 500 MG PO TABS
1000.0000 mg | ORAL_TABLET | Freq: Once | ORAL | Status: AC
  Administered 2021-09-04: 1000 mg via ORAL
  Filled 2021-09-04: qty 2

## 2021-09-04 MED ORDER — BUPIVACAINE IN DEXTROSE 0.75-8.25 % IT SOLN
INTRATHECAL | Status: DC | PRN
  Administered 2021-09-04: 1.6 mL via INTRATHECAL

## 2021-09-04 MED ORDER — ACETAMINOPHEN 500 MG PO TABS
1000.0000 mg | ORAL_TABLET | Freq: Four times a day (QID) | ORAL | Status: AC
  Administered 2021-09-04 – 2021-09-05 (×3): 1000 mg via ORAL
  Filled 2021-09-04 (×3): qty 2

## 2021-09-04 MED ORDER — BISACODYL 10 MG RE SUPP: 10.0000 mg | Freq: Every day | RECTAL | Status: DC | PRN

## 2021-09-04 MED ORDER — ESCITALOPRAM OXALATE 10 MG PO TABS
10.0000 mg | ORAL_TABLET | Freq: Every day | ORAL | Status: DC
  Administered 2021-09-04 – 2021-09-07 (×4): 10 mg via ORAL
  Filled 2021-09-04 (×4): qty 1

## 2021-09-04 MED ORDER — OXYCODONE HCL 5 MG PO TABS
5.0000 mg | ORAL_TABLET | ORAL | Status: DC | PRN
  Administered 2021-09-04: 5 mg via ORAL
  Filled 2021-09-04: qty 1
  Filled 2021-09-04 (×2): qty 2

## 2021-09-04 MED ORDER — LACTATED RINGERS IV SOLN: INTRAVENOUS | Status: DC

## 2021-09-04 MED ORDER — METHOCARBAMOL 500 MG PO TABS
500.0000 mg | ORAL_TABLET | Freq: Four times a day (QID) | ORAL | Status: DC | PRN
  Administered 2021-09-04 – 2021-09-07 (×7): 500 mg via ORAL
  Filled 2021-09-04 (×7): qty 1

## 2021-09-04 MED ORDER — BUPIVACAINE LIPOSOME 1.3 % IJ SUSP: 10.0000 mL | Freq: Once | INTRAMUSCULAR | Status: DC

## 2021-09-04 MED ORDER — ONDANSETRON HCL 4 MG PO TABS: 4.0000 mg | ORAL_TABLET | Freq: Four times a day (QID) | ORAL | Status: DC | PRN

## 2021-09-04 MED ORDER — MENTHOL 3 MG MT LOZG: 1.0000 | LOZENGE | OROMUCOSAL | Status: DC | PRN

## 2021-09-04 MED ORDER — TRAMADOL HCL 50 MG PO TABS
50.0000 mg | ORAL_TABLET | Freq: Four times a day (QID) | ORAL | Status: DC
  Administered 2021-09-04 – 2021-09-07 (×8): 50 mg via ORAL
  Filled 2021-09-04 (×8): qty 1

## 2021-09-04 MED ORDER — SODIUM CHLORIDE (PF) 0.9 % IJ SOLN
INTRAMUSCULAR | Status: AC
  Filled 2021-09-04: qty 10

## 2021-09-04 MED ORDER — ONDANSETRON HCL 4 MG/2ML IJ SOLN: 4.0000 mg | Freq: Four times a day (QID) | INTRAMUSCULAR | Status: DC | PRN

## 2021-09-04 MED ORDER — PHENYLEPHRINE HCL (PRESSORS) 10 MG/ML IV SOLN
INTRAVENOUS | Status: AC
  Filled 2021-09-04: qty 1

## 2021-09-04 MED ORDER — PHENOL 1.4 % MT LIQD: 1.0000 | OROMUCOSAL | Status: DC | PRN

## 2021-09-04 MED ORDER — CEFAZOLIN SODIUM-DEXTROSE 1-4 GM/50ML-% IV SOLN
1.0000 g | Freq: Four times a day (QID) | INTRAVENOUS | Status: AC
  Administered 2021-09-04 – 2021-09-05 (×2): 1 g via INTRAVENOUS
  Filled 2021-09-04 (×2): qty 50

## 2021-09-04 MED ORDER — HYDROMORPHONE HCL 1 MG/ML IJ SOLN
0.5000 mg | INTRAMUSCULAR | Status: DC | PRN
  Filled 2021-09-04: qty 1

## 2021-09-04 MED ORDER — VITAMIN D 25 MCG (1000 UNIT) PO TABS
2000.0000 [IU] | ORAL_TABLET | Freq: Every day | ORAL | Status: DC
  Administered 2021-09-04 – 2021-09-07 (×4): 2000 [IU] via ORAL
  Filled 2021-09-04 (×6): qty 2

## 2021-09-04 MED ORDER — PANTOPRAZOLE SODIUM 40 MG PO TBEC
40.0000 mg | DELAYED_RELEASE_TABLET | Freq: Every day | ORAL | Status: DC
  Administered 2021-09-04 – 2021-09-07 (×4): 40 mg via ORAL
  Filled 2021-09-04 (×4): qty 1

## 2021-09-04 MED ORDER — METOCLOPRAMIDE HCL 5 MG/ML IJ SOLN: 5.0000 mg | Freq: Three times a day (TID) | INTRAMUSCULAR | Status: DC | PRN

## 2021-09-04 MED ORDER — ALUM & MAG HYDROXIDE-SIMETH 200-200-20 MG/5ML PO SUSP
30.0000 mL | ORAL | Status: DC | PRN
  Administered 2021-09-05: 30 mL via ORAL
  Filled 2021-09-04: qty 30

## 2021-09-04 MED ORDER — CEFAZOLIN SODIUM-DEXTROSE 2-4 GM/100ML-% IV SOLN
2.0000 g | INTRAVENOUS | Status: AC
  Administered 2021-09-04: 2 g via INTRAVENOUS
  Filled 2021-09-04: qty 100

## 2021-09-04 MED ORDER — SODIUM CHLORIDE 0.9 % IV SOLN: INTRAVENOUS | Status: DC | PRN

## 2021-09-04 MED ORDER — MIDAZOLAM HCL 2 MG/2ML IJ SOLN
INTRAMUSCULAR | Status: AC
  Filled 2021-09-04: qty 2

## 2021-09-04 MED ORDER — FENTANYL CITRATE PF 50 MCG/ML IJ SOSY
PREFILLED_SYRINGE | INTRAMUSCULAR | Status: AC
  Filled 2021-09-04: qty 2

## 2021-09-04 MED ORDER — POLYETHYLENE GLYCOL 3350 17 G PO PACK: 17.0000 g | PACK | Freq: Every day | ORAL | Status: DC | PRN

## 2021-09-04 MED ORDER — MIDAZOLAM HCL 2 MG/2ML IJ SOLN
INTRAMUSCULAR | Status: DC | PRN
  Administered 2021-09-04 (×2): 1 mg via INTRAVENOUS

## 2021-09-04 MED ORDER — FENTANYL CITRATE (PF) 100 MCG/2ML IJ SOLN
INTRAMUSCULAR | Status: AC
  Filled 2021-09-04: qty 2

## 2021-09-04 MED ORDER — FENTANYL CITRATE PF 50 MCG/ML IJ SOSY
PREFILLED_SYRINGE | INTRAMUSCULAR | Status: AC
  Filled 2021-09-04: qty 1

## 2021-09-04 MED ORDER — TRANEXAMIC ACID-NACL 1000-0.7 MG/100ML-% IV SOLN
1000.0000 mg | INTRAVENOUS | Status: AC
  Administered 2021-09-04: 1000 mg via INTRAVENOUS
  Filled 2021-09-04: qty 100

## 2021-09-04 MED ORDER — ORAL CARE MOUTH RINSE: 15.0000 mL | Freq: Once | OROMUCOSAL | Status: AC

## 2021-09-04 MED ORDER — POVIDONE-IODINE 10 % EX SWAB
2.0000 "application " | Freq: Once | CUTANEOUS | Status: AC
  Administered 2021-09-04: 2 via TOPICAL

## 2021-09-04 MED ORDER — METHOCARBAMOL 1000 MG/10ML IJ SOLN
500.0000 mg | Freq: Four times a day (QID) | INTRAVENOUS | Status: DC | PRN
  Filled 2021-09-04: qty 5

## 2021-09-04 MED ORDER — ONDANSETRON HCL 4 MG/2ML IJ SOLN: 4.0000 mg | Freq: Once | INTRAMUSCULAR | Status: DC | PRN

## 2021-09-04 MED ORDER — WATER FOR IRRIGATION, STERILE IR SOLN
Status: DC | PRN
  Administered 2021-09-04: 2000 mL

## 2021-09-04 MED ORDER — ADULT MULTIVITAMIN W/MINERALS CH
1.0000 | ORAL_TABLET | Freq: Every day | ORAL | Status: DC
  Administered 2021-09-04 – 2021-09-07 (×4): 1 via ORAL
  Filled 2021-09-04 (×4): qty 1

## 2021-09-04 MED ORDER — POVIDONE-IODINE 10 % EX SWAB: 2.0000 "application " | Freq: Once | CUTANEOUS | Status: DC

## 2021-09-04 MED ORDER — OXYCODONE HCL 5 MG/5ML PO SOLN: 5.0000 mg | Freq: Once | ORAL | Status: DC | PRN

## 2021-09-04 MED ORDER — DEXAMETHASONE SODIUM PHOSPHATE 10 MG/ML IJ SOLN
INTRAMUSCULAR | Status: DC | PRN
  Administered 2021-09-04: 10 mg via INTRAVENOUS

## 2021-09-04 MED ORDER — DIPHENHYDRAMINE HCL 12.5 MG/5ML PO ELIX: 12.5000 mg | ORAL_SOLUTION | ORAL | Status: DC | PRN

## 2021-09-04 MED ORDER — DOCUSATE SODIUM 100 MG PO CAPS
100.0000 mg | ORAL_CAPSULE | Freq: Two times a day (BID) | ORAL | Status: DC
  Administered 2021-09-04 – 2021-09-07 (×6): 100 mg via ORAL
  Filled 2021-09-04 (×6): qty 1

## 2021-09-04 MED ORDER — OXYCODONE HCL 5 MG PO TABS: 5.0000 mg | ORAL_TABLET | Freq: Once | ORAL | Status: DC | PRN

## 2021-09-04 MED ORDER — BUPROPION HCL ER (XL) 300 MG PO TB24
300.0000 mg | ORAL_TABLET | Freq: Every day | ORAL | Status: DC
  Administered 2021-09-04 – 2021-09-07 (×4): 300 mg via ORAL
  Filled 2021-09-04 (×4): qty 1

## 2021-09-04 MED ORDER — CHLORHEXIDINE GLUCONATE 0.12 % MT SOLN
15.0000 mL | Freq: Once | OROMUCOSAL | Status: AC
  Administered 2021-09-04: 15 mL via OROMUCOSAL

## 2021-09-04 MED ORDER — BUPIVACAINE LIPOSOME 1.3 % IJ SUSP
INTRAMUSCULAR | Status: AC
  Filled 2021-09-04: qty 10

## 2021-09-04 MED ORDER — FENTANYL CITRATE (PF) 100 MCG/2ML IJ SOLN
INTRAMUSCULAR | Status: DC | PRN
  Administered 2021-09-04 (×2): 50 ug via INTRAVENOUS

## 2021-09-04 MED ORDER — DEXAMETHASONE SODIUM PHOSPHATE 10 MG/ML IJ SOLN
10.0000 mg | Freq: Once | INTRAMUSCULAR | Status: AC
  Administered 2021-09-05: 10 mg via INTRAVENOUS
  Filled 2021-09-04: qty 1

## 2021-09-04 MED ORDER — 0.9 % SODIUM CHLORIDE (POUR BTL) OPTIME
TOPICAL | Status: DC | PRN
  Administered 2021-09-04: 1000 mL

## 2021-09-04 MED ORDER — DEXAMETHASONE SODIUM PHOSPHATE 10 MG/ML IJ SOLN: 8.0000 mg | Freq: Once | INTRAMUSCULAR | Status: DC

## 2021-09-04 MED ORDER — FENTANYL CITRATE PF 50 MCG/ML IJ SOSY
25.0000 ug | PREFILLED_SYRINGE | INTRAMUSCULAR | Status: DC | PRN
  Administered 2021-09-04 (×2): 25 ug via INTRAVENOUS
  Administered 2021-09-04 (×2): 50 ug via INTRAVENOUS

## 2021-09-04 MED ORDER — TRANEXAMIC ACID-NACL 1000-0.7 MG/100ML-% IV SOLN
1000.0000 mg | Freq: Once | INTRAVENOUS | Status: AC
  Administered 2021-09-04: 1000 mg via INTRAVENOUS
  Filled 2021-09-04: qty 100

## 2021-09-04 SURGICAL SUPPLY — 47 items
APL PRP STRL LF DISP 70% ISPRP (MISCELLANEOUS) ×1
BAG COUNTER SPONGE SURGICOUNT (BAG) IMPLANT
BAG SPEC THK2 15X12 ZIP CLS (MISCELLANEOUS)
BAG SPNG CNTER NS LX DISP (BAG)
BAG ZIPLOCK 12X15 (MISCELLANEOUS) IMPLANT
BLADE SAG 18X100X1.27 (BLADE) ×2 IMPLANT
BLADE SURG SZ10 CARB STEEL (BLADE) ×2 IMPLANT
CHLORAPREP W/TINT 26 (MISCELLANEOUS) ×2 IMPLANT
CLSR STERI-STRIP ANTIMIC 1/2X4 (GAUZE/BANDAGES/DRESSINGS) ×2 IMPLANT
COVER PERINEAL POST (MISCELLANEOUS) ×2 IMPLANT
COVER SURGICAL LIGHT HANDLE (MISCELLANEOUS) ×2 IMPLANT
DECANTER SPIKE VIAL GLASS SM (MISCELLANEOUS) ×4 IMPLANT
DRAPE IMP U-DRAPE 54X76 (DRAPES) ×2 IMPLANT
DRAPE STERI IOBAN 125X83 (DRAPES) ×2 IMPLANT
DRAPE U-SHAPE 47X51 STRL (DRAPES) ×4 IMPLANT
DRSG MEPILEX BORDER 4X8 (GAUZE/BANDAGES/DRESSINGS) ×2 IMPLANT
ELECT REM PT RETURN 15FT ADLT (MISCELLANEOUS) ×2 IMPLANT
GLOVE SRG 8 PF TXTR STRL LF DI (GLOVE) ×1 IMPLANT
GLOVE SURG ENC MOIS LTX SZ7.5 (GLOVE) ×2 IMPLANT
GLOVE SURG POLYISO LF SZ7.5 (GLOVE) ×2 IMPLANT
GLOVE SURG SYN 7.5  E (GLOVE) ×2
GLOVE SURG SYN 7.5 E (GLOVE) ×1 IMPLANT
GLOVE SURG SYN 7.5 PF PI (GLOVE) ×1 IMPLANT
GLOVE SURG UNDER POLY LF SZ7.5 (GLOVE) ×2 IMPLANT
GLOVE SURG UNDER POLY LF SZ8 (GLOVE) ×2
GOWN STRL REUS W/TWL LRG LVL3 (GOWN DISPOSABLE) ×2 IMPLANT
GOWN STRL REUS W/TWL XL LVL3 (GOWN DISPOSABLE) ×2 IMPLANT
HEAD BIOLOX HIP 36/-5 (Joint) IMPLANT
HIP BIOLOX HD 36/-5 (Joint) ×2 IMPLANT
HOLDER FOLEY CATH W/STRAP (MISCELLANEOUS) IMPLANT
INSERT 0 DEGREE 36 (Miscellaneous) ×1 IMPLANT
KIT TURNOVER KIT A (KITS) IMPLANT
MANIFOLD NEPTUNE II (INSTRUMENTS) ×2 IMPLANT
NS IRRIG 1000ML POUR BTL (IV SOLUTION) ×2 IMPLANT
PACK ANTERIOR HIP CUSTOM (KITS) ×2 IMPLANT
PROTECTOR NERVE ULNAR (MISCELLANEOUS) ×2 IMPLANT
SCREW HEX LP 6.5X20 (Screw) ×2 IMPLANT
SHELL ACETAB TRIDENT 48 (Shell) ×1 IMPLANT
STEM FEM ACCOLADE 38X102X30 S3 (Stem) ×1 IMPLANT
SUT MNCRL AB 3-0 PS2 18 (SUTURE) ×2 IMPLANT
SUT VIC AB 0 CT1 36 (SUTURE) ×2 IMPLANT
SUT VIC AB 1 CT1 36 (SUTURE) ×2 IMPLANT
SUT VIC AB 2-0 CT1 27 (SUTURE) ×4
SUT VIC AB 2-0 CT1 TAPERPNT 27 (SUTURE) ×2 IMPLANT
TRAY FOLEY MTR SLVR 16FR STAT (SET/KITS/TRAYS/PACK) IMPLANT
TUBE SUCTION HIGH CAP CLEAR NV (SUCTIONS) ×2 IMPLANT
WATER STERILE IRR 1000ML POUR (IV SOLUTION) ×4 IMPLANT

## 2021-09-04 NOTE — Op Note (Signed)
09/04/2021  12:58 PM  PATIENT:  Jessica Casey   MRN: [id number]  PRE-OPERATIVE DIAGNOSIS:  OSTEOARTHRITIS  RIGHT HIP  POST-OPERATIVE DIAGNOSIS:  * No post-op diagnosis entered *  PROCEDURE:  Procedure(s): TOTAL HIP ARTHROPLASTY ANTERIOR APPROACH  PREOPERATIVE INDICATIONS:    Jessica Casey is an 70 y.o. female who has a diagnosis of <principal problem not specified> and elected for surgical management after failing conservative treatment.  The risks benefits and alternatives were discussed with the patient including but not limited to the risks of nonoperative treatment, versus surgical intervention including infection, bleeding, nerve injury, periprosthetic fracture, the need for revision surgery, dislocation, leg length discrepancy, blood clots, cardiopulmonary complications, morbidity, mortality, among others, and they were willing to proceed.     OPERATIVE REPORT     SURGEON:   Renette Butters, MD    ASSISTANT:  Aggie Moats, PA-C, he was present and scrubbed throughout the case, critical for completion in a timely fashion, and for retraction, instrumentation, and closure.     ANESTHESIA:  General    COMPLICATIONS:  None.     COMPONENTS:  Stryker acolade fit femur size 3 with a 36 mm -5 head ball and an acetabular shell size 48 with a  polyethylene liner    PROCEDURE IN DETAIL:   The patient was met in the holding area and  identified.  The appropriate hip was identified and marked at the operative site.  The patient was then transported to the OR  and  placed under anesthesia per that record.  At that point, the patient was  placed in the supine position and  secured to the operating room table and all bony prominences padded. He received pre-operative antibiotics    The operative lower extremity was prepped from the iliac crest to the distal leg.  Sterile draping was performed.  Time out was performed prior to incision.      Skin incision was made just 2 cm lateral to the  ASIS  extending in line with the tensor fascia lata. Electrocautery was used to control all bleeders. I dissected down sharply to the fascia of the tensor fascia lata was confirmed that the muscle fibers beneath were running posteriorly. I then incised the fascia over the superficial tensor fascia lata in line with the incision. The fascia was elevated off the anterior aspect of the muscle the muscle was retracted posteriorly and protected throughout the case. I then used electrocautery to incise the tensor fascia lata fascia control and all bleeders. Immediately visible was the fat over top of the anterior neck and capsule.  I removed the anterior fat from the capsule and elevated the rectus muscle off of the anterior capsule. I then removed a large time of capsule. The retractors were then placed over the anterior acetabulum as well as around the superior and inferior neck.  I then made a femoral neck cut. Then used the power corkscrew to remove the femoral head from the acetabulum and thoroughly irrigated the acetabulum. I sized the femoral head.    I then exposed the deep acetabulum, cleared out any tissue including the ligamentum teres.   After adequate visualization, I excised the labrum, and then sequentially reamed.  I then impacted the acetabular implant into place using fluoroscopy for guidance.  Appropriate version and inclination was confirmed clinically matching their bony anatomy, and with fluoroscopy.  I placed a 20 mm screw in the posterior/superio position with an excellent bite.    I then placed  the polyethylene liner in place  I then adducted the leg and released the external rotators from the posterior femur allowing it to be easily delivered up lateral and anterior to the acetabulum for preparation of the femoral canal.    I then prepared the proximal femur using the cookie-cutter and then sequentially reamed and broached.  A trial broach, neck, and head was utilized, and I  reduced the hip and used floroscopy to assess the neck length and femoral implant.  I then impacted the femoral prosthesis into place into the appropriate version. The hip was then reduced and fluoroscopy confirmed appropriate position. Leg lengths were restored.  I then irrigated the hip copiously again with, and repaired the fascia with Vicryl, followed by monocryl for the subcutaneous tissue, Monocryl for the skin, Steri-Strips and sterile gauze. The patient was then awakened and returned to PACU in stable and satisfactory condition. There were no complications.  POST OPERATIVE PLAN: WBAT, DVT px: SCD's/TED, ambulation and chemical dvt px  Jessica Lynch, MD Orthopedic Surgeon [phone]

## 2021-09-04 NOTE — Discharge Instructions (Signed)

## 2021-09-04 NOTE — Anesthesia Postprocedure Evaluation (Signed)
Anesthesia Post Note  Patient: [name]  Procedure(s) Performed: TOTAL HIP ARTHROPLASTY ANTERIOR APPROACH (Right: Hip)     Patient location during evaluation: PACU Anesthesia Type: Spinal Level of consciousness: oriented and awake and alert Pain management: pain level controlled Vital Signs Assessment: post-procedure vital signs reviewed and stable Respiratory status: spontaneous breathing, respiratory function stable and nonlabored ventilation Cardiovascular status: blood pressure returned to baseline and stable Postop Assessment: no headache, no backache, no apparent nausea or vomiting and spinal receding Anesthetic complications: no   No notable events documented.  Last Vitals:  Vitals:   09/04/21 1745 09/04/21 1805  BP: (!) 99/50 114/71  Pulse: 87 94  Resp: 14 18  Temp: 36.5 C 36.5 C  SpO2: 95% 100%    Last Pain:  Vitals:   09/04/21 1805  TempSrc: Oral  PainSc:                  Lidia Collum

## 2021-09-04 NOTE — Anesthesia Procedure Notes (Signed)
Spinal  Patient location during procedure: OR Start time: 09/04/2021 1:42 PM End time: 09/04/2021 1:46 PM Reason for block: surgical anesthesia Staffing Performed: anesthesiologist  Anesthesiologist: Audry Pili, MD Preanesthetic Checklist Completed: patient identified, IV checked, risks and benefits discussed, surgical consent, monitors and equipment checked, pre-op evaluation and timeout performed Spinal Block Patient position: sitting Prep: DuraPrep Patient monitoring: heart rate, cardiac monitor, continuous pulse ox and blood pressure Approach: midline Location: L2-3 Injection technique: single-shot Needle Needle type: Quincke  Needle gauge: 22 G Assessment Events: second provider Additional Notes Consent was obtained prior to the procedure with all questions answered and concerns addressed. Risks including, but not limited to, bleeding, infection, nerve damage, paralysis, failed block, inadequate analgesia, allergic reaction, high spinal, itching, and headache were discussed and the patient wished to proceed. Functioning IV was confirmed and monitors were applied. Sterile prep and drape, including hand hygiene, mask, and sterile gloves were used. The patient was positioned and the spine was prepped. The skin was anesthetized with lidocaine. Free flow of clear CSF was obtained prior to injecting local anesthetic into the CSF. The spinal needle aspirated freely following injection. The needle was carefully withdrawn. The patient tolerated the procedure well.   Renold Don, MD

## 2021-09-04 NOTE — Interval H&P Note (Signed)
History and Physical Interval Note:  09/04/2021 1:32 PM  Jessica Casey  has presented today for surgery, with the diagnosis of OSTEOARTHRITIS  RIGHT HIP.  The various methods of treatment have been discussed with the patient and family. After consideration of risks, benefits and other options for treatment, the patient has consented to  Procedure(s): TOTAL HIP ARTHROPLASTY ANTERIOR APPROACH (Right) as a surgical intervention.  The patient's history has been reviewed, patient examined, no change in status, stable for surgery.  I have reviewed the patient's chart and labs.  Questions were answered to the patient's satisfaction.     Renette Butters

## 2021-09-04 NOTE — Anesthesia Preprocedure Evaluation (Addendum)
Anesthesia Evaluation  Patient identified by MRN, date of birth, ID band Patient awake    Reviewed: Allergy & Precautions, NPO status , Patient's Chart, lab work & pertinent test results  History of Anesthesia Complications Negative for: history of anesthetic complications  Airway Mallampati: II  TM Distance: >3 FB Neck ROM: Limited    Dental  (+) Dental Advisory Given, Teeth Intact   Pulmonary sleep apnea and Continuous Positive Airway Pressure Ventilation ,    Pulmonary exam normal        Cardiovascular Normal cardiovascular exam+ Valvular Problems/Murmurs MVP      Neuro/Psych PSYCHIATRIC DISORDERS Anxiety Depression  Memory d/o     GI/Hepatic Neg liver ROS, GERD  Medicated and Controlled,  Endo/Other  negative endocrine ROS  Renal/GU negative Renal ROS     Musculoskeletal  (+) Arthritis , Osteoarthritis,    Abdominal   Peds  Hematology  (+) anemia ,   Anesthesia Other Findings   Reproductive/Obstetrics                            Anesthesia Physical Anesthesia Plan  ASA: 2  Anesthesia Plan: Spinal   Post-op Pain Management:    Induction:   PONV Risk Score and Plan: 3 and Treatment may vary due to age or medical condition, Propofol infusion and Ondansetron  Airway Management Planned: Natural Airway and Simple Face Mask  Additional Equipment: None  Intra-op Plan:   Post-operative Plan:   Informed Consent: I have reviewed the patients History and Physical, chart, labs and discussed the procedure including the risks, benefits and alternatives for the proposed anesthesia with the patient or authorized representative who has indicated his/her understanding and acceptance.       Plan Discussed with: CRNA and Anesthesiologist  Anesthesia Plan Comments: (Labs reviewed, platelets acceptable. Discussed risks and benefits of spinal, including spinal/epidural hematoma, infection,  failed block, and PDPH. Patient expressed understanding and wished to proceed. )       Anesthesia Quick Evaluation

## 2021-09-04 NOTE — Plan of Care (Signed)
  Problem: Education: Goal: Knowledge of General Education information will improve Description: Including pain rating scale, medication(s)/side effects and non-pharmacologic comfort measures Outcome: Progressing   Problem: Clinical Measurements: Goal: Cardiovascular complication will be avoided Outcome: Progressing   Problem: Activity: Goal: Risk for activity intolerance will decrease Outcome: Progressing   Problem: Nutrition: Goal: Adequate nutrition will be maintained Outcome: Progressing   Problem: Elimination: Goal: Will not experience complications related to bowel motility Outcome: Progressing   Problem: Pain Managment: Goal: General experience of comfort will improve Outcome: Progressing   Problem: Skin Integrity: Goal: Risk for impaired skin integrity will decrease Outcome: Progressing

## 2021-09-04 NOTE — Transfer of Care (Signed)
Immediate Anesthesia Transfer of Care Note  Patient: Jessica Casey  Procedure(s) Performed: Procedure(s): TOTAL HIP ARTHROPLASTY ANTERIOR APPROACH (Right)  Patient Location: PACU  Anesthesia Type:Spinal  Level of Consciousness: awake, alert  and oriented  Airway & Oxygen Therapy: Patient Spontanous Breathing  Post-op Assessment: Report given to RN and Post -op Vital signs reviewed and stable  Post vital signs: Reviewed and stable  Last Vitals:  Vitals:   09/04/21 1047  BP: 134/67  Pulse: 86  Resp: 16  Temp: 36.8 C  SpO2: 93%    Complications: No apparent anesthesia complications

## 2021-09-05 ENCOUNTER — Observation Stay (HOSPITAL_COMMUNITY): Payer: Medicare PPO

## 2021-09-05 DIAGNOSIS — Z9889 Other specified postprocedural states: Secondary | ICD-10-CM | POA: Diagnosis not present

## 2021-09-05 DIAGNOSIS — M79651 Pain in right thigh: Secondary | ICD-10-CM | POA: Diagnosis not present

## 2021-09-05 DIAGNOSIS — Z471 Aftercare following joint replacement surgery: Secondary | ICD-10-CM | POA: Diagnosis not present

## 2021-09-05 DIAGNOSIS — Z96641 Presence of right artificial hip joint: Secondary | ICD-10-CM | POA: Diagnosis not present

## 2021-09-05 NOTE — Plan of Care (Signed)
  Problem: Pain Management: Goal: Pain level will decrease with appropriate interventions Outcome: Progressing   Problem: Education: Goal: Knowledge of General Education information will improve Description: Including pain rating scale, medication(s)/side effects and non-pharmacologic comfort measures Outcome: Progressing   Problem: Health Behavior/Discharge Planning: Goal: Ability to manage health-related needs will improve Outcome: Progressing   Problem: Pain Managment: Goal: General experience of comfort will improve Outcome: Progressing

## 2021-09-05 NOTE — TOC Transition Note (Addendum)
Transition of Care [facility]) - CM/SW Discharge Note   Patient Details  Name: Jessica Casey MRN: [id number] Date of Birth: 1951-01-03  [facility]) CM/SW Contact:  Lennart Pall, LCSW Phone Number: 09/06/2021, 2:28 PM   Clinical Narrative:    Met with pt this morning who confirms she has all needed DME at home.  Plan for OPPT in [place].  No TOC needs.  ADDENDUM 12/8: Alerted by PT that pt now reports the need for a bedside commode and youth rw, noting the walker she has at home is "too high".  Confirmed need with pt.  Spoken with Ladell Heads, RN CM with [facility].  All agreed with orders being placed with [facility] for delivery to pt's hospital room.    Final next level of care: OP Rehab Barriers to Discharge: No Barriers Identified   Patient Goals and CMS Choice Patient states their goals for this hospitalization and ongoing recovery are:: return home      Discharge Placement                       Discharge Plan and Services                DME Arranged: [facility] youth, Bedside commode DME Agency: [employer] Date DME Agency Contacted: 09/06/21 Time DME Agency Contacted: [phone] Representative spoke with at DME Agency: [place] [employer]) Interventions     Readmission Risk Interventions No flowsheet data found.

## 2021-09-05 NOTE — Plan of Care (Signed)
  Problem: Activity: Goal: Ability to avoid complications of mobility impairment will improve Outcome: Progressing   Problem: Clinical Measurements: Goal: Postoperative complications will be avoided or minimized Outcome: Progressing   Problem: Pain Management: Goal: Pain level will decrease with appropriate interventions Outcome: Progressing   

## 2021-09-05 NOTE — Discharge Summary (Signed)
Physician Discharge Summary  Patient ID: Jessica Casey MRN: [id number] DOB/AGE: May 04, 1951 70 y.o.  Admit date: 09/04/2021 Discharge date: 09/07/2021  Admission Diagnoses: right hip OA  Discharge Diagnoses:  Principal Problem:   S/P total right hip arthroplasty   Discharged Condition: fair  Hospital Course: Patient underwent a right THA by Dr. Percell Miller on 03/0/09 without complications. She spent the night in observation for pain control and mobilization. She had some issues with weight bearing due to thigh pain which inhibited her ability to mobilize much at all. This required her to spend another 2 nights in the hospital. Today she was able to overcome these issues and pass her evaluations. She is ready to go home.   Consults: None  Significant Diagnostic Studies: n/a  Treatments: IV hydration, antibiotics: Ancef, analgesia: acetaminophen, Dilaudid, Morphine, and Oxycodone, anticoagulation: ASA, and surgery: right THA  Discharge Exam: Blood pressure (!) 111/57, pulse 100, temperature 98.8 F (37.1 C), temperature source Oral, resp. rate 18, height 4\' 11"  (1.499 m), weight 57.2 kg, last menstrual period 10/01/1995, SpO2 94 %. General appearance: alert, cooperative, and no distress Head: Normocephalic, without obvious abnormality, atraumatic Eyes: conjunctivae/corneas clear. PERRL, EOM's intact. Fundi benign. Resp: clear to auscultation bilaterally Cardio: regular rate and rhythm, S1, S2 normal, no murmur, click, rub or gallop GI: soft, non-tender; bowel sounds normal; no masses,  no organomegaly Extremities: extremities normal, atraumatic, no cyanosis or edema Pulses:  L brachial 2+ R brachial 2+  L radial 2+ R radial 2+  L inguinal 2+ R inguinal 2+  L popliteal 2+ R popliteal 2+  L posterior tibial 2+ R posterior tibial 2+  L dorsalis pedis 2+ R dorsalis pedis 2+   Skin: Skin color, texture, turgor normal. No rashes or lesions Neurologic: Alert and oriented X 3, normal  strength and tone. Normal symmetric reflexes. Normal coordination and gait Incision/Wound: c/d/i  Disposition: Discharge disposition: 01-Home or Self Care      Discharge Instructions     Call MD / Call 911   Complete by: As directed    If you experience chest pain or shortness of breath, CALL 911 and be transported to the hospital emergency room.  If you develope a fever above 101 F, pus (white drainage) or increased drainage or redness at the wound, or calf pain, call your surgeon's office.   Diet - low sodium heart healthy   Complete by: As directed    Discharge instructions   Complete by: As directed    You may bear weight as tolerated. Keep your dressing on and dry until follow up. Take medicine to prevent blood clots as directed. Take pain medicine as needed with the goal of transitioning to over the counter medicines.    INSTRUCTIONS AFTER JOINT REPLACEMENT   Remove items at home which could result in a fall. This includes throw rugs or furniture in walking pathways ICE to the affected joint every three hours while awake for 30 minutes at a time, for at least the first 3-5 days, and then as needed for pain and swelling.  Continue to use ice for pain and swelling. You may notice swelling that will progress down to the foot and ankle.  This is normal after surgery.  Elevate your leg when you are not up walking on it.   Continue to use the breathing machine you got in the hospital (incentive spirometer) which will help keep your temperature down.  It is common for your temperature to cycle up and down following  surgery, especially at night when you are not up moving around and exerting yourself.  The breathing machine keeps your lungs expanded and your temperature down.   DIET:  As you were doing prior to hospitalization, we recommend a well-balanced diet.  DRESSING / WOUND CARE / SHOWERING  You may shower 3 days after surgery, but keep the wounds dry during showering.  You may  use an occlusive plastic wrap (Press'n Seal for example) with blue painter's tape at edges, NO SOAKING/SUBMERGING IN THE BATHTUB.  If the bandage gets wet, call the office.   ACTIVITY  Increase activity slowly as tolerated, but follow the weight bearing instructions below.   No driving for 6 weeks or until further direction given by your physician.  You cannot drive while taking narcotics.  No lifting or carrying greater than 10 lbs. until further directed by your surgeon. Avoid periods of inactivity such as sitting longer than an hour when not asleep. This helps prevent blood clots.  You may return to work once you are authorized by your doctor.    WEIGHT BEARING   Weight bearing as tolerated with assist device (walker, cane, etc) as directed, use it as long as suggested by your surgeon or therapist, typically at least 4-6 weeks.   EXERCISES  Results after joint replacement surgery are often greatly improved when you follow the exercise, range of motion and muscle strengthening exercises prescribed by your doctor. Safety measures are also important to protect the joint from further injury. Any time any of these exercises cause you to have increased pain or swelling, decrease what you are doing until you are comfortable again and then slowly increase them. If you have problems or questions, call your caregiver or physical therapist for advice.   Rehabilitation is important following a joint replacement. After just a few days of immobilization, the muscles of the leg can become weakened and shrink (atrophy).  These exercises are designed to build up the tone and strength of the thigh and leg muscles and to improve motion. Often times heat used for twenty to thirty minutes before working out will loosen up your tissues and help with improving the range of motion but do not use heat for the first two weeks following surgery (sometimes heat can increase post-operative swelling).   These exercises  can be done on a training (exercise) mat, on the floor, on a table or on a bed. Use whatever works the best and is most comfortable for you.    Use music or television while you are exercising so that the exercises are a pleasant break in your day. This will make your life better with the exercises acting as a break in your routine that you can look forward to.   Perform all exercises about fifteen times, three times per day or as directed.  You should exercise both the operative leg and the other leg as well.  Exercises include:   Quad Sets - Tighten up the muscle on the front of the thigh (Quad) and hold for 5-10 seconds.   Straight Leg Raises - With your knee straight (if you were given a brace, keep it on), lift the leg to 60 degrees, hold for 3 seconds, and slowly lower the leg.  Perform this exercise against resistance later as your leg gets stronger.  Leg Slides: Lying on your back, slowly slide your foot toward your buttocks, bending your knee up off the floor (only go as far as is comfortable). Then slowly  slide your foot back down until your leg is flat on the floor again.  Angel Wings: Lying on your back spread your legs to the side as far apart as you can without causing discomfort.  Hamstring Strength:  Lying on your back, push your heel against the floor with your leg straight by tightening up the muscles of your buttocks.  Repeat, but this time bend your knee to a comfortable angle, and push your heel against the floor.  You may put a pillow under the heel to make it more comfortable if necessary.   A rehabilitation program following joint replacement surgery can speed recovery and prevent re-injury in the future due to weakened muscles. Contact your doctor or a physical therapist for more information on knee rehabilitation.    CONSTIPATION  Constipation is defined medically as fewer than three stools per week and severe constipation as less than one stool per week.  Even if you have a  regular bowel pattern at home, your normal regimen is likely to be disrupted due to multiple reasons following surgery.  Combination of anesthesia, postoperative narcotics, change in appetite and fluid intake all can affect your bowels.   YOU MUST use at least one of the following options; they are listed in order of increasing strength to get the job done.  They are all available over the counter, and you may need to use some, POSSIBLY even all of these options:    Drink plenty of fluids (prune juice may be helpful) and high fiber foods Colace 100 mg by mouth twice a day  Senokot for constipation as directed and as needed Dulcolax (bisacodyl), take with full glass of water  Miralax (polyethylene glycol) once or twice a day as needed.  If you have tried all these things and are unable to have a bowel movement in the first 3-4 days after surgery call either your surgeon or your primary doctor.    If you experience loose stools or diarrhea, hold the medications until you stool forms back up.  If your symptoms do not get better within 1 week or if they get worse, check with your doctor.  If you experience "the worst abdominal pain ever" or develop nausea or vomiting, please contact the office immediately for further recommendations for treatment.   ITCHING:  If you experience itching with your medications, try taking only a single pain pill, or even half a pain pill at a time.  You can also use Benadryl over the counter for itching or also to help with sleep.   TED HOSE STOCKINGS:  Use stockings on both legs until for at least 2 weeks or as directed by physician office. They may be removed at night for sleeping.  MEDICATIONS:  See your medication summary on the "After Visit Summary" that nursing will review with you.  You may have some home medications which will be placed on hold until you complete the course of blood thinner medication.  It is important for you to complete the blood thinner  medication as prescribed.  Take medicines as prescribed.   You have several different medicines that work in different ways. - Tylenol is for mild to moderate pain. Try to take this medicine before turning to your narcotic medicines.  - Celebrex is to reduce pain / inflammation - Robaxin is for muscle spasms. This medicine can make you drowsy. - Oxycodone is a narcotic pain medicine.  Take this for severe pain. This medicine can be dehydrating / constipating. -  Zofran is for nausea and vomiting. - Aspirin is to prevent blood clots after surgery.   PRECAUTIONS:  If you experience chest pain or shortness of breath - call 911 immediately for transfer to the hospital emergency department.   If you develop a fever greater that 101 F, purulent drainage from wound, increased redness or drainage from wound, foul odor from the wound/dressing, or calf pain - CONTACT YOUR SURGEON.                                                   FOLLOW-UP APPOINTMENTS:  If you do not already have a post-op appointment, please call the office [phone] for an appointment to be seen by Dr. Percell Miller in 2 weeks.   OTHER INSTRUCTIONS:   MAKE SURE YOU:  Understand these instructions.  Get help right away if you are not doing well or get worse.    Thank you for letting us be a part of your medical care team.  It is a privilege we respect greatly.  We hope these instructions will help you stay on track for a fast and full recovery!   Post-operative opioid taper instructions:   Complete by: As directed    POST-OPERATIVE OPIOID TAPER INSTRUCTIONS: It is important to wean off of your opioid medication as soon as possible. If you do not need pain medication after your surgery it is ok to stop day one. Opioids include: Codeine, Hydrocodone(Norco, Vicodin), Oxycodone(Percocet, oxycontin) and hydromorphone amongst others.  Long term and even short term use of opiods can cause: Increased pain  response Dependence Constipation Depression Respiratory depression And more.  Withdrawal symptoms can include Flu like symptoms Nausea, vomiting And more Techniques to manage these symptoms Hydrate well Eat regular healthy meals Stay active Use relaxation techniques(deep breathing, meditating, yoga) Do Not substitute Alcohol to help with tapering If you have been on opioids for less than two weeks and do not have pain than it is ok to stop all together.  Plan to wean off of opioids This plan should start within one week post op of your joint replacement. Maintain the same interval or time between taking each dose and first decrease the dose.  Cut the total daily intake of opioids by one tablet each day Next start to increase the time between doses. The last dose that should be eliminated is the evening dose.         Allergies as of 09/07/2021       Reactions   Gabapentin    Tongue swelling and itching         Medication List     TAKE these medications    acetaminophen 650 MG CR tablet Commonly known as: TYLENOL Take 1,300 mg by mouth every 8 (eight) hours as needed for pain. What changed: Another medication with the same name was added. Make sure you understand how and when to take each.   acetaminophen 500 MG tablet Commonly known as: TYLENOL Take 2 tablets (1,000 mg total) by mouth every 6 (six) hours as needed for mild pain or moderate pain. What changed: You were already taking a medication with the same name, and this prescription was added. Make sure you understand how and when to take each.   aspirin EC 81 MG tablet Take 1 tablet (81 mg total) by mouth 2 (two) times daily.  For DVT prophylaxis for 30 days after surgery.   BIOTIN PO Take 1 tablet by mouth daily.   buPROPion 300 MG 24 hr tablet Commonly known as: WELLBUTRIN XL Take 1 tablet (300 mg total) by mouth daily.   CALCIUM 600 + D PO Take 1 tablet by mouth daily.   celecoxib 200 MG  capsule Commonly known as: CeleBREX Take 1 capsule (200 mg total) by mouth 2 (two) times daily for 14 days. For 2 weeks post op for pain and inflammation.  Discontinue Ibuprofen or other Anti-inflammatory medicine when taking this medicine.   diclofenac sodium 1 % Gel Commonly known as: Voltaren Apply 2 g topically 4 (four) times daily. What changed:  when to take this reasons to take this   escitalopram 10 MG tablet Commonly known as: LEXAPRO Take 1 tablet (10 mg total) by mouth daily.   Melatonin 10 MG Tabs Take 10 mg by mouth at bedtime as needed (sleep).   methocarbamol 750 MG tablet Commonly known as: Robaxin-750 Take 1 tablet (750 mg total) by mouth every 8 (eight) hours as needed for muscle spasms.   multivitamin with minerals Tabs tablet Take 1 tablet by mouth daily.   omeprazole 40 MG capsule Commonly known as: PRILOSEC TAKE 1 CAPSULE (40 MG TOTAL) BY MOUTH DAILY.   ondansetron 4 MG disintegrating tablet Commonly known as: ZOFRAN-ODT Take 1 tablet (4 mg total) by mouth 2 (two) times daily as needed for nausea or vomiting.   oxyCODONE 5 MG immediate release tablet Commonly known as: Roxicodone Take 1 tablet (5 mg total) by mouth every 6 (six) hours as needed for severe pain. Do not take more than 6 tablets in a 24 hour period.   predniSONE 10 MG tablet Commonly known as: DELTASONE Take 1 tablet (10 mg total) by mouth with breakfast, with lunch, and with evening meal.   pregabalin 50 MG capsule Commonly known as: LYRICA Take 1 capsule (50 mg total) by mouth 3 (three) times daily.   REPLENS VA Place 1 application vaginally daily as needed (dryness).   Vicks DayQuil/NyQuil Severe Lqpk Generic drug: PE-DM-GG-APAP&PE-Doxyl-DM-APAP Take 15 mLs by mouth 2 (two) times daily as needed (congestion / cold symptoms).   Vitamin D 50 MCG (2000 UT) Caps Take 2,000 Units by mouth daily.               Durable Medical Equipment  (From admission, onward)            Start     Ordered   09/06/21 1336  For home use only DME 3 n 1  Once        09/06/21 1336   09/06/21 1335  For home use only DME Walker rolling  Once       Comments: Youth size  Question Answer Comment  Walker: With 5 Inch Wheels   Patient needs a walker to treat with the following condition Status post hip surgery      09/06/21 1336            Follow-up Information     Renette Butters, MD. Go on 09/19/2021.   Specialty: Orthopedic Surgery Why: Your appointment has been scheduled for 4:15. Contact information: [address] [place] [address] [phone]         [facility]. Go on 09/10/2021.   Specialty: Physical Medicine and Rehabilitation Why: Your outpatient physical therapy appointment is scheduled ro 1:00. Contact information: [address] [place] [address] [phone]                 Signed: Britt Bottom PA-C 09/07/2021, 4:16 PM

## 2021-09-05 NOTE — Progress Notes (Signed)
    Subjective: Patient reports pain as moderate to severe. Says once she puts weight on the right leg she has immediate severe pain in the thigh and is unable to stand. Tolerating diet. Urinating. No CP, SOB.  Has been working with PT on mobilizing OOB but limited by pain so far.   Objective:   VITALS:   Vitals:   09/04/21 1805 09/04/21 1952 09/05/21 0040 09/05/21 0543  BP: 114/71 109/64 134/74 (!) 98/52  Pulse: 94 90 (!) 103 88  Resp: 18 16 15 16   Temp: 97.7 F (36.5 C) 98.3 F (36.8 C) 98.1 F (36.7 C) (!) 97.5 F (36.4 C)  TempSrc: Oral Oral Oral   SpO2: 100% 99% 98% 97%  Weight: 57.2 kg     Height: 4\' 11"  (1.499 m)      CBC Latest Ref Rng & Units 08/29/2021 07/30/2021 02/11/2018  WBC 4.0 - 10.5 K/uL 13.2(H) 13.6(H) 6.5  Hemoglobin 12.0 - 15.0 g/dL 11.6(L) 11.6(L) 12.3  Hematocrit 36.0 - 46.0 % 36.3 35.7(L) 37.2  Platelets 150 - 400 K/uL 351 310.0 225.0   BMP Latest Ref Rng & Units 08/29/2021 07/30/2021 10/03/2020  Glucose 70 - 99 mg/dL 89 89 94  BUN 8 - 23 mg/dL 16 21 19   Creatinine 0.44 - 1.00 mg/dL 0.85 0.98 1.03  BUN/Creat Ratio 6 - 22 (calc) - - -  Sodium 135 - 145 mmol/L 138 142 141  Potassium 3.5 - 5.1 mmol/L 4.3 4.2 3.6  Chloride 98 - 111 mmol/L 105 105 107  CO2 22 - 32 mmol/L 27 30 29   Calcium 8.9 - 10.3 mg/dL 9.1 9.4 9.0   Intake/Output      12/06 0701 12/07 0700 12/07 0701 12/08 0700   P.O. 120    I.V. (mL/kg) 1200 (21)    IV Piggyback 250    Total Intake(mL/kg) 1570 (27.4)    Urine (mL/kg/hr) 1300    Blood 375    Total Output 1675    Net -105            Physical Exam: General: NAD.  Sitting up in bedside chair. Calm Resp: No increased wob Cardio: regular rate and rhythm ABD soft Neurologically intact MSK Neurovascularly intact Sensation intact distally Intact pulses distally Dorsiflexion/Plantar flexion intact Incision: dressing C/D/I   Assessment: 1 Day Post-Op  S/P Procedure(s) (LRB): TOTAL HIP ARTHROPLASTY ANTERIOR APPROACH  (Right) by Dr. Ernesta Amble. Percell Miller on 09/04/21  Principal Problem:   S/P total right hip arthroplasty    Plan: Work on pain control and mobilizing  Advance diet Up with therapy Incentive Spirometry Elevate and Apply ice  Weightbearing: WBAT RLE Insicional and dressing care: Dressings left intact until follow-up and Reinforce dressings as needed Orthopedic device(s): None Showering: Keep dressing dry VTE prophylaxis: Aspirin 81mg  BID  x 30 days post-op , SCDs, ambulation Pain control: Continue current regimen Follow - up plan: 2 weeks Contact information:  Edmonia Lynch MD, Aggie Moats PA-C  Dispo: Home hopefully later today if does better with PT this afternoon. Patient is hopeful to be able to d/c home.      Britt Bottom, PA-C Office [phone] 09/05/2021, 11:51 AM

## 2021-09-05 NOTE — Evaluation (Signed)
Physical Therapy Evaluation Patient Details Name: Jessica Casey MRN: [id number] DOB: May 26, 1951 Today's Date: 09/05/2021  History of Present Illness  Jessica Casey is a 70 yo female s/p R THA AA 09/03/21. PMH: DDD, osteoporosis  Clinical Impression  Pt is s/p R THA AA resulting in the deficits listed below (see PT Problem List). Pt independent at baseline, lives with spouse in level entry home, bed/bath on main level, has been using SPC for the past ~week and being more cautious due to pain with mobility. POD1, Pt currently with high pain in R distal thigh limiting mobility, upon standing difficulty tolerating hip extension due to burning sensation in thigh. Pt able to take 2 steps over to recliner, reports more painful with gravity pulling leg down/hip extension versus actual weight-bearing. Pt tolerates RLE strengthening exercises without pain complaints, able to perform reps and provided HEP. RN in room aware of pt's pain; will continue to progress mobility as able. Pt motivated to return home and begin OPPT to return to normal daily activities. Pt will benefit from skilled PT to increase their independence and safety with mobility to allow discharge to the venue listed below.         Recommendations for follow up therapy are one component of a multi-disciplinary discharge planning process, led by the attending physician.  Recommendations may be updated based on patient status, additional functional criteria and insurance authorization.  Follow Up Recommendations Follow physician's recommendations for discharge plan and follow up therapies    Assistance Recommended at Discharge Frequent or constant Supervision/Assistance  Functional Status Assessment Patient has had a recent decline in their functional status and demonstrates the ability to make significant improvements in function in a reasonable and predictable amount of time.  Equipment Recommendations  None recommended by PT     Recommendations for Other Services       Precautions / Restrictions Precautions Precautions: Fall Restrictions Weight Bearing Restrictions: No      Mobility  Bed Mobility Overal bed mobility: Needs Assistance Bed Mobility: Supine to Sit  Supine to sit: Min guard  General bed mobility comments: increased time, use of bedrail to scoot out to EOB, RLE able to slowly inch to EOB    Transfers Overall transfer level: Needs assistance Equipment used: Rolling walker (2 wheels) Transfers: Sit to/from Stand Sit to Stand: Min guard  General transfer comment: BUE assist to power up, maintains bil foot contact to floor, once rising up RLE hip flexion due to pain, prefers to keep RLE drawn up due to pain with extending hip    Ambulation/Gait  General Gait Details: pt able to take 2 steps to recliner, very painful with RLE weight-bearing/hip extension limiting ability to take steps and stand upright  Stairs            Wheelchair Mobility    Modified Rankin (Stroke Patients Only)       Balance Overall balance assessment: Needs assistance Sitting balance-Leahy Scale: Good  Standing balance support: During functional activity;Reliant on assistive device for balance;Bilateral upper extremity supported Standing balance-Leahy Scale: Poor       Pertinent Vitals/Pain Pain Assessment: Faces Faces Pain Scale: Hurts whole lot Pain Location: R thigh Pain Descriptors / Indicators: Stabbing;Sharp Pain Intervention(s): Limited activity within patient's tolerance;Monitored during session;Repositioned;Patient requesting pain meds-RN notified;Ice applied    Home Living Family/patient expects to be discharged to:: Private residence Living Arrangements: Spouse/significant other Available Help at Discharge: Family;Available 24 hours/day Type of Home: House Home Access: Level entry  Alternate Level Stairs-Number of Steps: flight Home Layout: Two level;Able to live on main level  with bedroom/bathroom Home Equipment: Rolling Walker (2 wheels);Cane - single point (CPAP at night)      Prior Function Prior Level of Function : Independent/Modified Independent  Mobility Comments: pt reports ind with community ambulation at baseline, using SPC for the past ~week due to pain ADLs Comments: pt reports ind with ADLs/IADLs at baseline, past ~week only taking shower if spouse is home in case needing assistance     Hand Dominance        Extremity/Trunk Assessment   Upper Extremity Assessment Upper Extremity Assessment: Overall WFL for tasks assessed    Lower Extremity Assessment Lower Extremity Assessment: RLE deficits/detail;LLE deficits/detail RLE Deficits / Details: AROM WNL, full knee extension, good quad set, SLR ~1 inch off bed, knee flex/ext grossly 3+/5, hip abd/add 4/5, hip flex grossly 3/5 RLE Sensation:  (slightly diminished over thigh) RLE Coordination: WNL LLE Deficits / Details: AROM WNL, strength 4+/5 LLE Sensation: WNL LLE Coordination: WNL    Cervical / Trunk Assessment Cervical / Trunk Assessment: Normal  Communication   Communication: No difficulties  Cognition Arousal/Alertness: Awake/alert Behavior During Therapy: WFL for tasks assessed/performed Overall Cognitive Status: Within Functional Limits for tasks assessed     General Comments      Exercises Total Joint Exercises Ankle Circles/Pumps: Seated;AROM;Both;15 reps Quad Sets: Seated;AROM;Strengthening;Right;10 reps Heel Slides: Seated;AROM;Strengthening;Right;5 reps Long Arc Quad: Seated;AROM;Strengthening;Right;10 reps   Assessment/Plan    PT Assessment Patient needs continued PT services  PT Problem List Decreased strength;Decreased range of motion;Decreased activity tolerance;Decreased balance;Pain       PT Treatment Interventions DME instruction;Gait training;Functional mobility training;Therapeutic activities;Therapeutic exercise;Balance training;Patient/family  education;Modalities    PT Goals (Current goals can be found in the Care Plan section)  Acute Rehab PT Goals Patient Stated Goal: outpatient PT PT Goal Formulation: With patient Time For Goal Achievement: 09/19/21 Potential to Achieve Goals: Good    Frequency 7X/week   Barriers to discharge        Co-evaluation               AM-PAC PT "6 Clicks" Mobility  Outcome Measure Help needed turning from your back to your side while in a flat bed without using bedrails?: A Little Help needed moving from lying on your back to sitting on the side of a flat bed without using bedrails?: A Little Help needed moving to and from a bed to a chair (including a wheelchair)?: A Little Help needed standing up from a chair using your arms (e.g., wheelchair or bedside chair)?: A Little Help needed to walk in hospital room?: A Lot Help needed climbing 3-5 steps with a railing? : Total 6 Click Score: 15    End of Session Equipment Utilized During Treatment: Gait belt Activity Tolerance: Patient tolerated treatment well;Patient limited by pain Patient left: in chair;with call bell/phone within reach;with nursing/sitter in room Nurse Communication: Mobility status PT Visit Diagnosis: Unsteadiness on feet (R26.81);Other abnormalities of gait and mobility (R26.89);Muscle weakness (generalized) (M62.81);Pain;Difficulty in walking, not elsewhere classified (R26.2) Pain - Right/Left: Right Pain - part of body: Hip    Time: 9458-5929 PT Time Calculation (min) (ACUTE ONLY): 35 min   Charges:   PT Evaluation $PT Eval Low Complexity: 1 Low PT Treatments $Therapeutic Exercise: 8-22 mins         Tori Lazaria Schaben PT, DPT 09/05/21, 11:37 AM

## 2021-09-05 NOTE — Progress Notes (Signed)
Physical Therapy Treatment Patient Details Name: Jessica Casey MRN: [id number] DOB: [birth year] Today's Date: 09/05/2021   History of Present Illness Jessica Casey is a 70 yo female s/p R THA AA 09/03/21. PMH: DDD, osteoporosis    PT Comments    Pt continues to be limited by high pain in distal R thigh, reports as stabbing, ripping, cutting. Pt able to perform RLE LAQ and quad set in sitting, hip abd/add/flex in sitting and standing, knee flexion in standing without pain sensation. Pt performs STS reps from recliner with heavy cues on maintaining bil flat foot contact, sharp shooting pain in R thigh when ~20 deg from knee extension, but also maintains trunk flexed forward. Educated pt on upright posture with STS reps. Pt progressed to tolerate standing lateral weight shifting, improved ability with bil flexed knees/crouched posture despite cues for upright posture. Pt able to achieve full R knee extension in standing, but keeps trunk forward flexed ~20 degrees. Pt is very pleasant and motivated, limited by high pain causing withdrawal-like motion when trying to straighten leg with upright posture. Pt able to take 2 steps x3 reps, seated rest break between, maintain knees in flexed posture. RN notified of session. Educated pt on performing additional exercises later today after rest and pt verbalized understanding. Pt is progressing, though slowly due to pain when trying to stand upright; no pain at rest or while performing seated BLE strengthening exercises. Will continue to progress gait and mobility as able with goal to return home with spouse to assist as needed.   Recommendations for follow up therapy are one component of a multi-disciplinary discharge planning process, led by the attending physician.  Recommendations may be updated based on patient status, additional functional criteria and insurance authorization.  Follow Up Recommendations  Follow physician's recommendations for discharge plan  and follow up therapies     Assistance Recommended at Discharge Frequent or constant Supervision/Assistance  Equipment Recommendations  None recommended by PT    Recommendations for Other Services       Precautions / Restrictions Precautions Precautions: Fall Restrictions Weight Bearing Restrictions: No     Mobility  Bed Mobility General bed mobility comments: in recliner    Transfers Overall transfer level: Needs assistance Equipment used: Rolling walker (2 wheels) Transfers: Sit to/from Stand Sit to Stand: Min guard  General transfer comment: VC for hand placement and maintaining bil flat foot contact with floor, prefers to withdraw R foot up with trunk/hip/knee extension    Ambulation/Gait Ambulation/Gait assistance: Min assist  Assistive device: Rolling walker (2 wheels)  Pre-gait activities: Pt perform lateral weight-shifts R/L, RLE hip marching in standing with return to flat foot contact- VC for upright posture General Gait Details: pt takes 2 steps x3 reps, maintains forward flexed posture with bil knee flexion for comfort, requires seated rest break between, significant sharp/stabbing/cutting pain limiting tolerance, attempted sidesteps but unable, able to shift weight laterally with bil flat foot contact minimally with increased time and cues   Stairs             Wheelchair Mobility    Modified Rankin (Stroke Patients Only)       Balance Overall balance assessment: Needs assistance   Standing balance support: During functional activity;Reliant on assistive device for balance;Bilateral upper extremity supported Standing balance-Leahy Scale: Poor Standing balance comment: reliant on RW        Cognition Arousal/Alertness: Awake/alert Behavior During Therapy: WFL for tasks assessed/performed Overall Cognitive Status: Within Functional Limits for tasks assessed  Exercises     General Comments        Pertinent Vitals/Pain Pain  Assessment: 0-10 Pain Score: 10-Worst pain ever Faces Pain Scale: Hurts whole lot Pain Location: R distal thigh Pain Descriptors / Indicators: Sharp;Stabbing ("cutting, ripping") Pain Intervention(s): Limited activity within patient's tolerance;Monitored during session;Premedicated before session;Repositioned;Ice applied    Home Living                          Prior Function            PT Goals (current goals can now be found in the care plan section) Acute Rehab PT Goals Patient Stated Goal: outpatient PT PT Goal Formulation: With patient Time For Goal Achievement: 09/19/21 Potential to Achieve Goals: Good Progress towards PT goals: Progressing toward goals (very slowly, high pain in distal R thigh)    Frequency    7X/week      PT Plan Current plan remains appropriate    Co-evaluation              AM-PAC PT "6 Clicks" Mobility   Outcome Measure  Help needed turning from your back to your side while in a flat bed without using bedrails?: A Little Help needed moving from lying on your back to sitting on the side of a flat bed without using bedrails?: A Little Help needed moving to and from a bed to a chair (including a wheelchair)?: A Little Help needed standing up from a chair using your arms (e.g., wheelchair or bedside chair)?: A Little Help needed to walk in hospital room?: A Lot Help needed climbing 3-5 steps with a railing? : Total 6 Click Score: 15    End of Session Equipment Utilized During Treatment: Gait belt Activity Tolerance: Patient limited by pain Patient left: in chair;with call bell/phone within reach;with family/visitor present Nurse Communication: Mobility status PT Visit Diagnosis: Unsteadiness on feet (R26.81);Other abnormalities of gait and mobility (R26.89);Muscle weakness (generalized) (M62.81);Pain;Difficulty in walking, not elsewhere classified (R26.2) Pain - Right/Left: Right Pain - part of body: Hip     Time:  1326-1400 PT Time Calculation (min) (ACUTE ONLY): 34 min  Charges:  $Gait Training: 8-22 mins $Therapeutic Activity: 8-22 mins                      Tori Royelle Hinchman PT, DPT 09/05/21, 2:30 PM

## 2021-09-05 NOTE — Progress Notes (Signed)
Pt. Complained of sharp pain on her Rt. Thigh while up with the PT. PRN pain medications given and informed Britt Bottom, PA. She said she will come to see the pt.

## 2021-09-06 ENCOUNTER — Encounter (HOSPITAL_COMMUNITY): Payer: Self-pay | Admitting: Orthopedic Surgery

## 2021-09-06 MED ORDER — EPINEPHRINE 0.3 MG/0.3ML IJ SOAJ
0.3000 mg | Freq: Once | INTRAMUSCULAR | Status: DC | PRN
  Filled 2021-09-06: qty 0.6

## 2021-09-06 MED ORDER — PREGABALIN 50 MG PO CAPS
50.0000 mg | ORAL_CAPSULE | Freq: Three times a day (TID) | ORAL | Status: DC
  Administered 2021-09-06 – 2021-09-07 (×5): 50 mg via ORAL
  Filled 2021-09-06 (×5): qty 1

## 2021-09-06 NOTE — Plan of Care (Signed)
  Problem: Safety: Goal: Ability to remain free from injury will improve Outcome: Progressing   Problem: Pain Managment: Goal: General experience of comfort will improve Outcome: Progressing   

## 2021-09-06 NOTE — Progress Notes (Signed)
Physical Therapy Treatment Patient Details Name: Jessica Casey MRN: [id number] DOB: 1951-03-17 Today's Date: 09/06/2021   History of Present Illness Jessica Casey is a 70 yo female s/p R THA AA 09/03/21. PMH: DDD, osteoporosis    PT Comments    POD # 2 am session Assisted pt OOB to amb to bathroom was difficult.  General transfer comment: 25% VC's on proper hand placement and 100% VC's to place R foot on floor.  Pt continuously c/o R distal lateral thigh pain when attempting to WB.  X rays taken yesterday are normal.  Pt describes pain as "sharp", "shooting" .  Pt was unable to functionally amb.  She "hopped" to bathroom insisting no other way.  Pt self NWBing R LE.  "I can't put that foot on the the floor", "It hurts too bad".  Pt unable to static stand with both feet on floor.  Pt was premedicated.  "It doesn't hurt if I hold it up".  Unable to amb in hallway, pt "hopped"  to bathroom and then to recliner. General Gait Details: pt consistantly unable to tolerate any WBing thru R LE due to "intense" pain at distal lateral thigh.  Despite multiple attempts to correct.  Pt "hopped" to the bathroom and "hopped" back to her recliner.  Performed a few TE's and applied ICE.  Educated on performing AP, knee presses and gluteal sqeezes throughout the day. Pt has NOT met goals to safely D/C to home and will see her again this afternoon.   Recommendations for follow up therapy are one component of a multi-disciplinary discharge planning process, led by the attending physician.  Recommendations may be updated based on patient status, additional functional criteria and insurance authorization.  Follow Up Recommendations  Follow physician's recommendations for discharge plan and follow up therapies     Assistance Recommended at Discharge Frequent or constant Supervision/Assistance  Equipment Recommendations  Rolling walker (2 wheels);BSC/3in1 ([employer])    Recommendations for Other Services        Precautions / Restrictions Precautions Precautions: Fall Restrictions Weight Bearing Restrictions: No     Mobility  Bed Mobility Overal bed mobility: Needs Assistance Bed Mobility: Supine to Sit     Supine to sit: Min guard     General bed mobility comments: demonstarted and instructed pt how to use a belt to self assist LE    Transfers Overall transfer level: Needs assistance Equipment used: Rolling walker (2 wheels) Transfers: Sit to/from Stand Sit to Stand: Min guard           General transfer comment: 25% VC's on proper hand placement and 100% VC's to place R foot on floor.  Pt continuously c/o R distal lateral thigh pain when attempting to WB.  X rays taken yesterday are normal.  Pt describes pain as "sharp", "shooting" .  Pt was unable to functionally amb.  She "hopped" to bathroom insisting no other way.  Pt self NWBing R LE.  "I can't put that foot on the the floor", "It hurts too bad".  Pt unable to static stand with both feet on floor.  Pt was premedicated.  "It doesn't hurt if I hold it up".  Unable to amb in hallway, pt "hopped"  to bathroom and then to recliner.    Ambulation/Gait Ambulation/Gait assistance: Min assist Gait Distance (Feet): 12 Feet (6 feet x 2) Assistive device: Rolling walker (2 wheels) (youth)   Gait velocity: decreased     General Gait Details: pt consistantly unable to tolerate any  WBing thru R LE due to "intense" pain at distal lateral thigh.  Despite multiple attempts to correct.  Pt "hopped" to the bathroom and "hopped" back to her recliner.   Stairs             Wheelchair Mobility    Modified Rankin (Stroke Patients Only)       Balance                                            Cognition Arousal/Alertness: Awake/alert Behavior During Therapy: WFL for tasks assessed/performed Overall Cognitive Status: Within Functional Limits for tasks assessed                                  General Comments: AxO x 3 very sweet Lady but present with some anxiety        Exercises  10 reps AP, knee presses, gluteal squeezes, HS with belt    General Comments        Pertinent Vitals/Pain Pain Assessment: 0-10 Pain Score: 9  Faces Pain Scale: Hurts worst Pain Location: R distal lateral thigh Pain Descriptors / Indicators: Sharp;Stabbing;Shooting Pain Intervention(s): Monitored during session;Premedicated before session;Repositioned;Ice applied    Home Living                          Prior Function            PT Goals (current goals can now be found in the care plan section) Progress towards PT goals: Progressing toward goals    Frequency    7X/week      PT Plan Current plan remains appropriate    Co-evaluation              AM-PAC PT "6 Clicks" Mobility   Outcome Measure  Help needed turning from your back to your side while in a flat bed without using bedrails?: A Little Help needed moving from lying on your back to sitting on the side of a flat bed without using bedrails?: A Little Help needed moving to and from a bed to a chair (including a wheelchair)?: A Little Help needed standing up from a chair using your arms (e.g., wheelchair or bedside chair)?: A Little Help needed to walk in hospital room?: A Lot Help needed climbing 3-5 steps with a railing? : Total 6 Click Score: 15    End of Session Equipment Utilized During Treatment: Gait belt Activity Tolerance: Patient limited by pain Patient left: in chair;with call bell/phone within reach;with family/visitor present Nurse Communication: Mobility status PT Visit Diagnosis: Unsteadiness on feet (R26.81);Other abnormalities of gait and mobility (R26.89);Muscle weakness (generalized) (M62.81);Pain;Difficulty in walking, not elsewhere classified (R26.2) Pain - Right/Left: Right Pain - part of body: Hip     Time: 3149-7026 PT Time Calculation (min) (ACUTE ONLY): 34 min  Charges:   $Gait Training: 8-22 mins $Therapeutic Exercise: 8-22 mins                    Rica Koyanagi  PTA Acute  Rehabilitation Services Pager      [phone] Office      [phone]

## 2021-09-06 NOTE — Plan of Care (Signed)
  Problem: Activity: Goal: Ability to avoid complications of mobility impairment will improve Outcome: Progressing   Problem: Clinical Measurements: Goal: Postoperative complications will be avoided or minimized Outcome: Progressing   Problem: Pain Management: Goal: Pain level will decrease with appropriate interventions Outcome: Progressing   

## 2021-09-06 NOTE — Progress Notes (Signed)
Physical Therapy Treatment Patient Details Name: Jessica Casey MRN: [id number] DOB: Feb 19, 1951 Today's Date: 09/06/2021   History of Present Illness Jessica Casey is a 70 yo female s/p R THA AA 09/03/21. PMH: DDD, osteoporosis    PT Comments    POD # 2 pm session Pt still unable to tolerate any WBing thru R LE.  X Rays are normal.  Able to tolerate TE's.  Able to tolerate pushing her R foot into wall.  I positioned recliner such that B feet were up against a wall and had pt perform knee presses while pushing B feet into wall.  But when she stands upright, she NOT tolerate even the slightest WBing.  Pt has NOT met mobility goals to D/C to home today.  She continues to "hop" which is unsafe. Has yet to do stairs. May need adjustment in pain meds +/or address her anxiety.    Recommendations for follow up therapy are one component of a multi-disciplinary discharge planning process, led by the attending physician.  Recommendations may be updated based on patient status, additional functional criteria and insurance authorization.  Follow Up Recommendations  Follow physician's recommendations for discharge plan and follow up therapies     Assistance Recommended at Discharge Frequent or constant Supervision/Assistance  Equipment Recommendations  Rolling walker (2 wheels);BSC/3in1 (Youth)    Recommendations for Other Services       Precautions / Restrictions Precautions Precautions: Fall Restrictions Weight Bearing Restrictions: No     Mobility  Bed Mobility Overal bed mobility: Needs Assistance Bed Mobility: Supine to Sit     Supine to sit: Min guard     General bed mobility comments: demonstarted and instructed pt how to use a belt to self assist LE    Transfers Overall transfer level: Needs assistance Equipment used: Rolling walker (2 wheels) Transfers: Sit to/from Stand Sit to Stand: Min guard           General transfer comment: pt self able with increased time but  still unable to tolerate any WBing thru R LE.  Static standing with walker with R LE drawn upward "feels better".  Performed several sit to stands to attempt to engage even the slightest WBing thur R LE.    Ambulation/Gait Ambulation/Gait assistance: Min assist Gait Distance (Feet): 6 Feet Assistive device: Rolling walker (2 wheels) Gait Pattern/deviations: Step-to pattern;Decreased stance time - left Gait velocity: decreased     General Gait Details: pt consistantly unable to tolerate any WBing thru R LE due to "intense" pain at distal lateral thigh.   Stairs             Wheelchair Mobility    Modified Rankin (Stroke Patients Only)       Balance                                            Cognition Arousal/Alertness: Awake/alert Behavior During Therapy: WFL for tasks assessed/performed Overall Cognitive Status: Within Functional Limits for tasks assessed                                 General Comments: AxO x 3 very sweet Lady but present with some anxiety        Exercises  Total Hip Replacement TE's following HEP Handout 10 reps ankle pumps 05 reps knee presses 05  reps heel slides 05 reps SAQ's 05 reps ABD Instructed how to use a belt loop to assist  Followed by ICE     General Comments        Pertinent Vitals/Pain Pain Assessment: 0-10 Pain Score: 9  Faces Pain Scale: Hurts worst Pain Location: R distal lateral thigh Pain Descriptors / Indicators: Sharp;Stabbing;Shooting Pain Intervention(s): Monitored during session;Premedicated before session;Repositioned;Ice applied    Home Living                          Prior Function            PT Goals (current goals can now be found in the care plan section) Progress towards PT goals: Progressing toward goals    Frequency    7X/week      PT Plan Current plan remains appropriate    Co-evaluation              AM-PAC PT "6 Clicks" Mobility    Outcome Measure  Help needed turning from your back to your side while in a flat bed without using bedrails?: A Little Help needed moving from lying on your back to sitting on the side of a flat bed without using bedrails?: A Little Help needed moving to and from a bed to a chair (including a wheelchair)?: A Little Help needed standing up from a chair using your arms (e.g., wheelchair or bedside chair)?: A Little Help needed to walk in hospital room?: A Lot Help needed climbing 3-5 steps with a railing? : Total 6 Click Score: 15    End of Session Equipment Utilized During Treatment: Gait belt Activity Tolerance: Patient limited by pain Patient left: in chair;with call bell/phone within reach;with family/visitor present Nurse Communication: Mobility status PT Visit Diagnosis: Unsteadiness on feet (R26.81);Other abnormalities of gait and mobility (R26.89);Muscle weakness (generalized) (M62.81);Pain;Difficulty in walking, not elsewhere classified (R26.2) Pain - Right/Left: Right Pain - part of body: Hip     Time: 1318-1400 PT Time Calculation (min) (ACUTE ONLY): 42 min  Charges:  $Gait Training: 8-22 mins $Therapeutic Exercise: 8-22 mins $Therapeutic Activity: 8-22 mins                     {Anahita Cua  PTA Acute  Rehabilitation Services Pager      [phone] Office      [phone]

## 2021-09-06 NOTE — Care Plan (Signed)
Patient remains in the hospital. Slow to progress with therapy due to pain.  Have moved her OPPT to 09/10/21 at 1:00. AVS updated and will notifiy patient.    Ladell Heads, [place]

## 2021-09-06 NOTE — Progress Notes (Signed)
Subjective: Patient reports pain as mild to moderate as long as she is not standing. When she stands and puts weight on her RLE, she immediately has a sharp, burning, searing intense pain in her distal right lateral thigh near her knee. Denies ever experiencing this before. H/o lumbar fusion but says this feels very different. No n/t down RLE. Only feels this same pain when supine if leg is extended/hyperextended past zero degrees. Goes away within several moments once she takes weight off the leg and massages the area. This has greatly limited her mobilization OOB.   Xrays ordered last night negative for any fracture, dislocation, or loosing of implant.   No other abnormal symptoms. Tolerating diet.  Urinating. No CP, SOB.  Objective:   VITALS:   Vitals:   09/05/21 1224 09/05/21 1922 09/05/21 2212 09/06/21 0530  BP: 99/63  132/62 (!) 131/59  Pulse: 100 87 (!) 106 (!) 101  Resp: 18 16 20 20   Temp: 98.2 F (36.8 C)  98.6 F (37 C) 98.3 F (36.8 C)  TempSrc: Oral  Oral Oral  SpO2: 94% 94% 91% 97%  Weight:      Height:       CBC Latest Ref Rng & Units 08/29/2021 07/30/2021 02/11/2018  WBC 4.0 - 10.5 K/uL 13.2(H) 13.6(H) 6.5  Hemoglobin 12.0 - 15.0 g/dL 11.6(L) 11.6(L) 12.3  Hematocrit 36.0 - 46.0 % 36.3 35.7(L) 37.2  Platelets 150 - 400 K/uL 351 310.0 225.0   BMP Latest Ref Rng & Units 08/29/2021 07/30/2021 10/03/2020  Glucose 70 - 99 mg/dL 89 89 94  BUN 8 - 23 mg/dL 16 21 19   Creatinine 0.44 - 1.00 mg/dL 0.85 0.98 1.03  BUN/Creat Ratio 6 - 22 (calc) - - -  Sodium 135 - 145 mmol/L 138 142 141  Potassium 3.5 - 5.1 mmol/L 4.3 4.2 3.6  Chloride 98 - 111 mmol/L 105 105 107  CO2 22 - 32 mmol/L 27 30 29   Calcium 8.9 - 10.3 mg/dL 9.1 9.4 9.0   Intake/Output      12/07 0701 12/08 0700 12/08 0701 12/09 0700   P.O. 120 220   I.V. (mL/kg)     IV Piggyback     Total Intake(mL/kg) 120 (2.1) 220 (3.8)   Urine (mL/kg/hr)  250 (1)   Stool  0   Blood     Total Output  250   Net  +120 -30        Urine Occurrence 4 x 1 x   Stool Occurrence 0 x 0 x      Physical Exam: General: NAD.  Sitting up on edge of bed, waiting to be helped to the restroom. Calm Resp: No increased wob Cardio: regular rate and rhythm ABD soft Neurologically intact MSK Neurovascularly intact Sensation intact distally Intact pulses distally Dorsiflexion/Plantar flexion intact Weak but intact hip flexion Right knee ROM intact. Not TTP. Stable to varus and valgus stress. No patellar translation.  Mildly TTP if palpate deep along right IT band No discrete fluid collection felt in area of pain. No ecchymosis. Generalized edema to right thigh Incision: dressing C/D/I   Assessment: 2 Days Post-Op  S/P Procedure(s) (LRB): TOTAL HIP ARTHROPLASTY ANTERIOR APPROACH (Right) by Dr. Ernesta Amble. Murphy on 09/04/21  Principal Problem:   S/P total right hip arthroplasty   Plan: After a long discussion with the patient, she says she only took Gabapentin once for UE n/t and says it caused mild tongue swelling. The pain she is describing now  sounds nerve related. I'd like to do a trial dose of Lyrica and see how she handles it. Benadryl is ordered PRN if needed if she starts to react negatively to it.  Advance diet Up with therapy Incentive Spirometry Elevate and Apply ice  Weightbearing: WBAT RLE Insicional and dressing care: Dressings left intact until follow-up and Reinforce dressings as needed Orthopedic device(s): None Showering: Keep dressing dry VTE prophylaxis: Aspirin 81mg  BID  x 30 days post-op , SCDs, ambulation Pain control: Oxycodone, Tylenol Follow - up plan: 2 weeks Contact information:  Edmonia Lynch MD, Aggie Moats PA-C  Dispo: Home hopefully today if can better mobilize and control pain. Patient eager to progress.    Britt Bottom, PA-C Office [phone] 09/06/2021, 11:22 AM

## 2021-09-07 ENCOUNTER — Inpatient Hospital Stay (HOSPITAL_COMMUNITY): Payer: Medicare PPO

## 2021-09-07 DIAGNOSIS — Z981 Arthrodesis status: Secondary | ICD-10-CM | POA: Diagnosis not present

## 2021-09-07 DIAGNOSIS — K219 Gastro-esophageal reflux disease without esophagitis: Secondary | ICD-10-CM | POA: Diagnosis present

## 2021-09-07 DIAGNOSIS — F32A Depression, unspecified: Secondary | ICD-10-CM | POA: Diagnosis present

## 2021-09-07 DIAGNOSIS — G4730 Sleep apnea, unspecified: Secondary | ICD-10-CM | POA: Diagnosis present

## 2021-09-07 DIAGNOSIS — M1611 Unilateral primary osteoarthritis, right hip: Secondary | ICD-10-CM | POA: Diagnosis present

## 2021-09-07 DIAGNOSIS — M810 Age-related osteoporosis without current pathological fracture: Secondary | ICD-10-CM | POA: Diagnosis present

## 2021-09-07 DIAGNOSIS — Z79899 Other long term (current) drug therapy: Secondary | ICD-10-CM | POA: Diagnosis not present

## 2021-09-07 DIAGNOSIS — Z888 Allergy status to other drugs, medicaments and biological substances status: Secondary | ICD-10-CM | POA: Diagnosis not present

## 2021-09-07 MED ORDER — METHOCARBAMOL 750 MG PO TABS: 750.0000 mg | ORAL_TABLET | Freq: Three times a day (TID) | ORAL | 0 refills | Status: DC | PRN

## 2021-09-07 MED ORDER — PREGABALIN 50 MG PO CAPS: 50.0000 mg | ORAL_CAPSULE | Freq: Three times a day (TID) | ORAL | 0 refills | Status: DC

## 2021-09-07 MED ORDER — PREDNISONE 10 MG PO TABS: 10.0000 mg | ORAL_TABLET | Freq: Three times a day (TID) | ORAL | 0 refills | Status: DC

## 2021-09-07 MED ORDER — PREDNISONE 5 MG PO TABS
10.0000 mg | ORAL_TABLET | Freq: Three times a day (TID) | ORAL | Status: DC
  Administered 2021-09-07: 10 mg via ORAL
  Filled 2021-09-07: qty 2

## 2021-09-07 MED ORDER — ASPIRIN EC 81 MG PO TBEC: 81.0000 mg | DELAYED_RELEASE_TABLET | Freq: Two times a day (BID) | ORAL | 0 refills | Status: DC

## 2021-09-07 MED ORDER — OXYCODONE HCL 5 MG PO TABS: 5.0000 mg | ORAL_TABLET | Freq: Four times a day (QID) | ORAL | 0 refills | Status: DC | PRN

## 2021-09-07 MED ORDER — CELECOXIB 200 MG PO CAPS: 200.0000 mg | ORAL_CAPSULE | Freq: Two times a day (BID) | ORAL | 0 refills | Status: AC

## 2021-09-07 MED ORDER — ACETAMINOPHEN 500 MG PO TABS: 1000.0000 mg | ORAL_TABLET | Freq: Four times a day (QID) | ORAL | 0 refills | Status: DC | PRN

## 2021-09-07 MED ORDER — ONDANSETRON 4 MG PO TBDP: 4.0000 mg | ORAL_TABLET | Freq: Two times a day (BID) | ORAL | 0 refills | Status: DC | PRN

## 2021-09-07 NOTE — Progress Notes (Signed)
Physical Therapy Treatment Patient Details Name: Jessica Casey MRN: [id number] DOB: 12-20-1950 Today's Date: 09/07/2021   History of Present Illness Jessica Casey is a 70 yo female s/p R THA AA 09/03/21. PMH: DDD, osteoporosis    PT Comments    POD # 3 am session General Comments: AxO x 3 very sweet Lady and less anxiety today Son present.  Assisted OOB to amb in hallway.  General bed mobility comments: pt self able to transfer in and OOB B LE at same time and using ABD/core.  General transfer comment: 75% VC's on proper tech.  To avoid pulling self up on walker.  Present with initial posterior lean and tendancy to place B feet too close to eachother.  Had to perform 3 times to correct. General Gait Details: pt was able to amb in hallway 35 feet with her R foot fully placed on floor with 50% VC's.  Pain was less and anxiety was not present this session.  Son present who also encouraged "foot flat" for balance/stability and safety.  50% VC's on proper feet spacing and safety with turns.  Assisted to recliner and performed a few TE's of AP, knee presses and gluteal squeezes before applying ICE.  Educated pt on importance of mobility and TE's to increase circulation and decrease swelling.   Pt will need another PT session to practice one step/threshold and increase her amb safety.   Recommendations for follow up therapy are one component of a multi-disciplinary discharge planning process, led by the attending physician.  Recommendations may be updated based on patient status, additional functional criteria and insurance authorization.  Follow Up Recommendations  Follow physician's recommendations for discharge plan and follow up therapies     Assistance Recommended at Discharge Frequent or constant Supervision/Assistance  Equipment Recommendations  Rolling walker (2 wheels);BSC/3in1 (youth)    Recommendations for Other Services       Precautions / Restrictions Precautions Precautions:  Fall Restrictions Weight Bearing Restrictions: No     Mobility  Bed Mobility Overal bed mobility: Needs Assistance;Modified Independent             General bed mobility comments: pt self able to transfer in and OOB B LE at same time and using ABD/core    Transfers Overall transfer level: Needs assistance Equipment used: Rolling walker (2 wheels) Transfers: Sit to/from Stand Sit to Stand: Min guard           General transfer comment: 75% VC's on proper tech.  To avoid pulling self up on walker.  Present with initial posterior lean and tendancy to place B feet too close to eachother.  Had to perform 3 times to correct.    Ambulation/Gait Ambulation/Gait assistance: Min guard;Min assist Gait Distance (Feet): 35 Feet Assistive device: Rolling walker (2 wheels) Gait Pattern/deviations: Step-to pattern;Decreased stance time - left Gait velocity: decreased     General Gait Details: pt was able to amb in hallway with her R foot fully placed on floor with 50% VC's.  Pain was less and anxiety was not present this session.  Son present who also encouraged "foot flat" for balance/stability and safety.  50% VC's on proper feet spacing and safety with turns.   Stairs             Wheelchair Mobility    Modified Rankin (Stroke Patients Only)       Balance  Cognition Arousal/Alertness: Awake/alert Behavior During Therapy: WFL for tasks assessed/performed Overall Cognitive Status: Within Functional Limits for tasks assessed                                 General Comments: AxO x 3 very sweet Lady and less anxiety today        Exercises  Total Hip Replacement TE's following HEP Handout 10 reps ankle pumps 05 reps knee presses 05 reps heel slides 05 reps SAQ's 05 reps ABD Instructed how to use a belt loop to assist  Followed by ICE     General Comments        Pertinent  Vitals/Pain Pain Assessment: 0-10 Pain Score: 8  Pain Location: R distal lateral thigh Pain Descriptors / Indicators: Grimacing;Tender;Guarding Pain Intervention(s): Monitored during session;Premedicated before session;Repositioned;Ice applied    Home Living                          Prior Function            PT Goals (current goals can now be found in the care plan section) Progress towards PT goals: Progressing toward goals    Frequency    7X/week      PT Plan Current plan remains appropriate    Co-evaluation              AM-PAC PT "6 Clicks" Mobility   Outcome Measure  Help needed turning from your back to your side while in a flat bed without using bedrails?: A Little Help needed moving from lying on your back to sitting on the side of a flat bed without using bedrails?: A Little Help needed moving to and from a bed to a chair (including a wheelchair)?: A Little Help needed standing up from a chair using your arms (e.g., wheelchair or bedside chair)?: A Little Help needed to walk in hospital room?: A Little Help needed climbing 3-5 steps with a railing? : A Lot 6 Click Score: 17    End of Session Equipment Utilized During Treatment: Gait belt Activity Tolerance: Patient tolerated treatment well Patient left: in chair;with call bell/phone within reach;with family/visitor present Nurse Communication: Mobility status PT Visit Diagnosis: Unsteadiness on feet (R26.81);Other abnormalities of gait and mobility (R26.89);Muscle weakness (generalized) (M62.81);Pain;Difficulty in walking, not elsewhere classified (R26.2) Pain - Right/Left: Right Pain - part of body: Hip     Time: 4696-2952 PT Time Calculation (min) (ACUTE ONLY): 32 min  Charges:  $Gait Training: 8-22 mins $Therapeutic Activity: 8-22 mins                     Rica Koyanagi  PTA Acute  Rehabilitation Services Pager      [phone] Office      [phone]

## 2021-09-07 NOTE — Progress Notes (Signed)
Subjective: Patient continues to reports pain as mild to moderate as long as she is not standing. When she stands and puts weight on her RLE, she immediately has a sharp, burning, searing intense pain in her distal right lateral thigh near her knee. Denies ever experiencing this before. H/o lumbar fusion but says this feels very different. No n/t down RLE. Only feels this same pain when supine if leg is extended/hyperextended past zero degrees. Goes away within several moments once she takes weight off the leg and massages the area. This has greatly limited her mobilization OOB.   Xrays ordered last night negative for any fracture, dislocation, or loosing of implant.   No other abnormal symptoms. Tolerating diet.  Urinating. No CP, SOB.  Objective:   VITALS:   Vitals:   09/06/21 0530 09/06/21 1427 09/06/21 2144 09/07/21 0546  BP: (!) 131/59 (!) 105/53 (!) 105/54 (!) 109/51  Pulse: (!) 101 98 100 98  Resp: 20 16 17 17   Temp: 98.3 F (36.8 C) 98.7 F (37.1 C) 98.2 F (36.8 C) 98.4 F (36.9 C)  TempSrc: Oral Oral Oral Oral  SpO2: 97% 94% 96% 92%  Weight:      Height:       CBC Latest Ref Rng & Units 08/29/2021 07/30/2021 02/11/2018  WBC 4.0 - 10.5 K/uL 13.2(H) 13.6(H) 6.5  Hemoglobin 12.0 - 15.0 g/dL 11.6(L) 11.6(L) 12.3  Hematocrit 36.0 - 46.0 % 36.3 35.7(L) 37.2  Platelets 150 - 400 K/uL 351 310.0 225.0   BMP Latest Ref Rng & Units 08/29/2021 07/30/2021 10/03/2020  Glucose 70 - 99 mg/dL 89 89 94  BUN 8 - 23 mg/dL 16 21 19   Creatinine 0.44 - 1.00 mg/dL 0.85 0.98 1.03  BUN/Creat Ratio 6 - 22 (calc) - - -  Sodium 135 - 145 mmol/L 138 142 141  Potassium 3.5 - 5.1 mmol/L 4.3 4.2 3.6  Chloride 98 - 111 mmol/L 105 105 107  CO2 22 - 32 mmol/L 27 30 29   Calcium 8.9 - 10.3 mg/dL 9.1 9.4 9.0   Intake/Output      12/08 0701 12/09 0700 12/09 0701 12/10 0700   P.O. 980    Total Intake(mL/kg) 980 (17.1)    Urine (mL/kg/hr) 250 (0.2)    Stool 0    Total Output 250    Net +730          Urine Occurrence 6 x 1 x   Stool Occurrence 0 x       Physical Exam: General: NAD.  Sleeping in bed comfortably, easily awoken Resp: No increased wob Cardio: regular rate and rhythm ABD soft Neurologically intact MSK Neurovascularly intact Sensation intact distally Intact pulses distally Dorsiflexion/Plantar flexion intact Weak but intact hip flexion Right knee ROM intact. Not TTP. Stable to varus and valgus stress. No patellar translation.  Mildly TTP if palpate deep along right IT band No discrete fluid collection felt in area of pain. No ecchymosis. Generalized edema to right thigh Incision: dressing C/D/I   Assessment: 3 Days Post-Op  S/P Procedure(s) (LRB): TOTAL HIP ARTHROPLASTY ANTERIOR APPROACH (Right) by Dr. Ernesta Amble. Percell Miller on 09/04/21  Principal Problem:   S/P total right hip arthroplasty   Plan: Lyrica started yesterday Will start Prednisone high dose today Order right hip CT to rule out intra-op fracture of pelvis or femur that could be causing referred pain Advance diet Up with therapy Incentive Spirometry Elevate and Apply ice  Weightbearing: WBAT RLE Insicional and dressing care: Dressings left intact  until follow-up and Reinforce dressings as needed Orthopedic device(s): None Showering: Keep dressing dry VTE prophylaxis: Aspirin 81mg  BID  x 30 days post-op , SCDs, ambulation Pain control: Oxycodone, Tylenol Follow - up plan: 2 weeks Contact information:  Edmonia Lynch MD, Aggie Moats PA-C  Dispo: Home hopefully today if can better mobilize and control pain. Patient eager to progress.    Britt Bottom, PA-C Office [phone] 09/07/2021, 10:58 AM

## 2021-09-07 NOTE — Progress Notes (Signed)
Physical Therapy Treatment Patient Details Name: Jessica Casey MRN: [id number] DOB: Sep 10, 1951 Today's Date: 09/07/2021   History of Present Illness Jessica Casey is a 70 yo female s/p R THA AA 09/03/21. PMH: DDD, osteoporosis    PT Comments    POD # 3 pm session Son and Spouse present during session.  Assisted OOB to amb in hallway with improvement.  General Gait Details: tolerated an increased distance with <25% VC's on proper tech.  Tolerated placing R foot fully on floor and reported 6/10 pain. General stair comments: practiced one step/threshold with walker at 50% VC's on proper walker placement and sequencing.  Performed twice. Practiced getting in/out of a high bed using a step stool.  Discussed need for a shower seat.  Educated on importance of AP, knee presses to decrease swelling and increase circulation.  Addressed all mobility questions, discussed appropriate activity, educated on use of ICE.  Pt ready for D/C to home.   Recommendations for follow up therapy are one component of a multi-disciplinary discharge planning process, led by the attending physician.  Recommendations may be updated based on patient status, additional functional criteria and insurance authorization.  Follow Up Recommendations  Follow physician's recommendations for discharge plan and follow up therapies     Assistance Recommended at Discharge Frequent or constant Supervision/Assistance  Equipment Recommendations  Rolling walker (2 wheels);BSC/3in1 (youth)    Recommendations for Other Services       Precautions / Restrictions Precautions Precautions: Fall Restrictions Weight Bearing Restrictions: No     Mobility  Bed Mobility Overal bed mobility: Modified Independent Bed Mobility: Supine to Sit;Sit to Supine           General bed mobility comments: pt self able with increased time    Transfers Overall transfer level: Needs assistance Equipment used: Rolling walker (2 wheels) Transfers:  Sit to/from Stand Sit to Stand: Supervision           General transfer comment: 25% VC's on proper tech.  To avoid pulling self up on walker.  Present with initial posterior lean.    Ambulation/Gait Ambulation/Gait assistance: Supervision;Min guard Gait Distance (Feet): 57 Feet Assistive device: Rolling walker (2 wheels) Gait Pattern/deviations: Step-to pattern;Decreased stance time - left Gait velocity: decreased     General Gait Details: tolerated an increased distance with <25% VC's on proper tech.  Tolerated placing R foot fully on floor and reported 6/10 pain.   Stairs Stairs: Yes Stairs assistance: Min assist Stair Management: No rails;Step to pattern;Forwards;With walker   General stair comments: practiced one step/threshold with walker at 50% VC's on proper walker placement and sequencing.  Performed twice.   Wheelchair Mobility    Modified Rankin (Stroke Patients Only)       Balance                                            Cognition Arousal/Alertness: Awake/alert Behavior During Therapy: WFL for tasks assessed/performed Overall Cognitive Status: Within Functional Limits for tasks assessed                                 General Comments: AxO x 3 very sweet Lady and less anxiety today        Exercises      General Comments        Pertinent  Vitals/Pain Pain Assessment: 0-10 Pain Score: 8  Pain Location: R distal lateral thigh Pain Descriptors / Indicators: Grimacing;Tender;Guarding Pain Intervention(s): Monitored during session;Premedicated before session;Repositioned;Ice applied    Home Living                          Prior Function            PT Goals (current goals can now be found in the care plan section) Progress towards PT goals: Progressing toward goals    Frequency    7X/week      PT Plan Current plan remains appropriate    Co-evaluation              AM-PAC PT "6  Clicks" Mobility   Outcome Measure  Help needed turning from your back to your side while in a flat bed without using bedrails?: A Little Help needed moving from lying on your back to sitting on the side of a flat bed without using bedrails?: A Little Help needed moving to and from a bed to a chair (including a wheelchair)?: A Little Help needed standing up from a chair using your arms (e.g., wheelchair or bedside chair)?: A Little Help needed to walk in hospital room?: A Little Help needed climbing 3-5 steps with a railing? : A Lot 6 Click Score: 17    End of Session Equipment Utilized During Treatment: Gait belt Activity Tolerance: Patient tolerated treatment well Patient left: in bed Nurse Communication: Mobility status PT Visit Diagnosis: Unsteadiness on feet (R26.81);Other abnormalities of gait and mobility (R26.89);Muscle weakness (generalized) (M62.81);Pain;Difficulty in walking, not elsewhere classified (R26.2) Pain - Right/Left: Right Pain - part of body: Hip     Time: 1430-1510 PT Time Calculation (min) (ACUTE ONLY): 40 min  Charges:  $Gait Training: 8-22 mins $Therapeutic Activity: 23-37 mins                     Rica Koyanagi  PTA Acute  Rehabilitation Services Pager      [phone] Office      [phone]

## 2021-09-07 NOTE — Plan of Care (Signed)
  Problem: Pain Management: Goal: Pain level will decrease with appropriate interventions Outcome: Progressing   

## 2021-09-10 DIAGNOSIS — R2689 Other abnormalities of gait and mobility: Secondary | ICD-10-CM | POA: Diagnosis not present

## 2021-09-10 DIAGNOSIS — M25551 Pain in right hip: Secondary | ICD-10-CM | POA: Diagnosis not present

## 2021-09-13 DIAGNOSIS — M25551 Pain in right hip: Secondary | ICD-10-CM | POA: Diagnosis not present

## 2021-09-13 DIAGNOSIS — R2689 Other abnormalities of gait and mobility: Secondary | ICD-10-CM | POA: Diagnosis not present

## 2021-09-17 ENCOUNTER — Encounter: Payer: Self-pay | Admitting: Obstetrics and Gynecology

## 2021-09-17 ENCOUNTER — Other Ambulatory Visit: Payer: Self-pay

## 2021-09-17 ENCOUNTER — Ambulatory Visit (INDEPENDENT_AMBULATORY_CARE_PROVIDER_SITE_OTHER): Payer: Medicare PPO | Admitting: Obstetrics and Gynecology

## 2021-09-17 ENCOUNTER — Other Ambulatory Visit (HOSPITAL_COMMUNITY)
Admission: RE | Admit: 2021-09-17 | Discharge: 2021-09-17 | Disposition: A | Discharge status: Home / Self Care | Payer: Medicare PPO | Source: Ambulatory Visit | Attending: Obstetrics and Gynecology | Admitting: Obstetrics and Gynecology

## 2021-09-17 VITALS — BP 142/60 | HR 88 | Ht <= 58 in | Wt 125.0 lb

## 2021-09-17 DIAGNOSIS — Z124 Encounter for screening for malignant neoplasm of cervix: Secondary | ICD-10-CM | POA: Diagnosis not present

## 2021-09-17 DIAGNOSIS — Z5181 Encounter for therapeutic drug level monitoring: Secondary | ICD-10-CM | POA: Diagnosis not present

## 2021-09-17 DIAGNOSIS — Z1151 Encounter for screening for human papillomavirus (HPV): Secondary | ICD-10-CM | POA: Diagnosis not present

## 2021-09-17 DIAGNOSIS — Z01419 Encounter for gynecological examination (general) (routine) without abnormal findings: Secondary | ICD-10-CM

## 2021-09-17 DIAGNOSIS — F419 Anxiety disorder, unspecified: Secondary | ICD-10-CM | POA: Diagnosis not present

## 2021-09-17 DIAGNOSIS — F32A Depression, unspecified: Secondary | ICD-10-CM

## 2021-09-17 MED ORDER — FLUOXETINE HCL 10 MG PO TABS: 10.0000 mg | ORAL_TABLET | Freq: Every day | ORAL | 2 refills | Status: DC

## 2021-09-17 MED ORDER — BUPROPION HCL ER (XL) 300 MG PO TB24: 300.0000 mg | ORAL_TABLET | Freq: Every day | ORAL | 3 refills | Status: DC

## 2021-09-17 NOTE — Patient Instructions (Signed)

## 2021-09-17 NOTE — Progress Notes (Signed)
70 y.o. G52P2002 Married Caucasian female here for annual exam.    Had her right hip replaced.  Taking Lyrica and Celebrex for nerve pain.   Was going to start Prolia through endocrinology but this did not occur.  She will see Dr. Cruzita Lederer is January, 2023.  Taking calcium and vit D.   Taking Wellbutrin and Lexapro.  She used to be on Lexapro 20 mg and this was too much for her.  Feels like the combination of the two is not working as well as it did in the past.   She noticed this before she had her hip surgery.  She was on Prozac in the past and did well on this.   PCP:   Betty [name], MD  Patient's last menstrual period was 10/01/1995 (approximate).           Sexually active: No.  The current method of family planning is post menopausal status.    Exercising: Yes.     PT for hip replacement Smoker:  no  Health Maintenance: Pap:  07/28/18 Neg, 07/05/16 Neg, 05/14/13 Neg:Neg HR HPV History of abnormal Pap:  no MMG:  05-28-21 Neg/BiRads1 Colonoscopy:  07-17-17 Polyps;next due 2023 BMD:  08-23-19  Result :Osteopenia--sees Dr.Gherghe TDaP:  2020 Gardasil:   no HIV: never Hep C:06-11-19 Neg Screening Labs:  PCP.   reports that she has never smoked. She has never used smokeless tobacco. She reports that she does not drink alcohol and does not use drugs.  Past Medical History:  Diagnosis Date   Anxiety    Cataract    Colonic polyp 2005 & 2013   DDD (degenerative disc disease)    Depression    GERD (gastroesophageal reflux disease)    H/O echocardiogram    before 2000, no need for f/u /w cardiac    Heart murmur    MVP- echo- 2010, no symptoms    Memory disorder 09/19/2017   Mononucleosis 1971   Osteoporosis    Sleep apnea     Past Surgical History:  Procedure Laterality Date   ANTERIOR CERVICAL DECOMP/DISCECTOMY FUSION N/A 05/20/2013   Procedure: ANTERIOR CERVICAL DECOMPRESSION/DISCECTOMY FUSION 2 LEVELS;  Surgeon: Erline Levine, MD;  Location: [facility];  Service:  Neurosurgery;  Laterality: N/A;  Cervical Seven-Thoracic One, Thoracic One-Two Anterior cervical decompression/Diskectomy/Fusion   CARPAL TUNNEL RELEASE Right    CATARACT EXTRACTION, BILATERAL  10/01/2011   Dr Gershon Crane, [place]     2 proceduresr Vertell Limber   colonoscopy with polypectomy  10/01/2011   Dr Deatra Ina   FRACTURE SURGERY     L wrist - June 2014   KNEE SURGERY     Dr Wonda Olds ; bursa cystectomy   LUMBAR DISC SURGERY     Dr.Stern   ROTATOR CUFF REPAIR     Dr.Wainer   TOTAL HIP ARTHROPLASTY Right 09/04/2021   Procedure: TOTAL HIP ARTHROPLASTY ANTERIOR APPROACH;  Surgeon: Renette Butters, MD;  Location: [facility];  Service: Orthopedics;  Laterality: Right;   UMBILICAL HERNIA REPAIR     as a baby   WRIST FRACTURE SURGERY Left 02/28/2013   Dr. Kathryne Hitch    Current Outpatient Medications  Medication Sig Dispense Refill   acetaminophen (TYLENOL) 650 MG CR tablet Take 1,300 mg by mouth every 8 (eight) hours as needed for pain.     aspirin EC 81 MG tablet Take 1 tablet (81 mg total) by mouth 2 (two) times daily. For DVT prophylaxis for 30 days after surgery. [place]  tablet 0   BIOTIN PO Take 1 tablet by mouth daily.     buPROPion (WELLBUTRIN XL) 300 MG 24 hr tablet Take 1 tablet (300 mg total) by mouth daily. 30 tablet 0   Calcium Carb-Cholecalciferol (CALCIUM 600 + D PO) Take 1 tablet by mouth daily.     celecoxib (CELEBREX) 200 MG capsule Take 1 capsule (200 mg total) by mouth 2 (two) times daily for 14 days. For 2 weeks post op for pain and inflammation.  Discontinue Ibuprofen or other Anti-inflammatory medicine when taking this medicine. 28 capsule 0   Cholecalciferol (VITAMIN D) 2000 UNITS CAPS Take 2,000 Units by mouth daily.     Cyanocobalamin (VITAMIN B-12) 1000 MCG/15ML LIQD      diclofenac sodium (VOLTAREN) 1 % GEL Apply 2 g topically 4 (four) times daily. (Patient taking differently: Apply 2 g topically 4 (four) times daily as needed (pain).) 3 Tube 3   escitalopram  (LEXAPRO) 10 MG tablet Take 1 tablet (10 mg total) by mouth daily. 30 tablet 0   Melatonin 10 MG TABS Take 10 mg by mouth at bedtime as needed (sleep).     Multiple Vitamin (MULTIVITAMIN WITH MINERALS) TABS tablet Take 1 tablet by mouth daily.     omeprazole (PRILOSEC) 40 MG capsule TAKE 1 CAPSULE (40 MG TOTAL) BY MOUTH DAILY. 30 capsule 1   ondansetron (ZOFRAN-ODT) 4 MG disintegrating tablet Take 1 tablet (4 mg total) by mouth 2 (two) times daily as needed for nausea or vomiting. 10 tablet 0   oxyCODONE (ROXICODONE) 5 MG immediate release tablet Take 1 tablet (5 mg total) by mouth every 6 (six) hours as needed for severe pain. Do not take more than 6 tablets in a 24 hour period. 28 tablet 0   predniSONE (DELTASONE) 10 MG tablet Take 1 tablet (10 mg total) by mouth with breakfast, with lunch, and with evening meal. 30 tablet 0   pregabalin (LYRICA) 50 MG capsule Take 1 capsule (50 mg total) by mouth 3 (three) times daily. 60 capsule 0   No current facility-administered medications for this visit.    Family History  Problem Relation Age of Onset   Diabetes Father    Coronary artery disease Father        S/P CBAG , no MI -- Dec   Depression Father    Diverticulosis Father    Breast cancer Mother        estorgen receptor positive   Stroke Mother 17       Dec   Breast cancer Sister        estrogen receptor positive   Depression Sister    Depression Sister    Depression Sister    Depression Sister    Depression Brother    Bipolar disorder Other        Neice   Colon polyps Sister    Colon cancer Neg Hx    Esophageal cancer Neg Hx    Pancreatic cancer Neg Hx    Rectal cancer Neg Hx    Stomach cancer Neg Hx     Review of Systems  All other systems reviewed and are negative.  Exam:   BP (!) 142/60    Pulse 88    Ht 4\' 9"  (1.448 m)    Wt 125 lb (56.7 kg)    LMP 10/01/1995 (Approximate)    SpO2 99%    BMI 27.05 kg/m     General appearance: alert, cooperative and appears stated  age Head: normocephalic,  without obvious abnormality, atraumatic Neck: no adenopathy, supple, symmetrical, trachea midline and thyroid normal to inspection and palpation Lungs: clear to auscultation bilaterally Breasts: normal appearance, no masses or tenderness, No nipple retraction or dimpling, No nipple discharge or bleeding, No axillary adenopathy Heart: regular rate and rhythm Abdomen: soft, non-tender; no masses, no organomegaly Extremities: extremities normal, atraumatic, no cyanosis or edema Skin: skin color, texture, turgor normal. No rashes or lesions Lymph nodes: cervical, supraclavicular, and axillary nodes normal. Neurologic: grossly normal  Pelvic: External genitalia:  no lesions              No abnormal inguinal nodes palpated.              Urethra:  normal appearing urethra with no masses, tenderness or lesions              Bartholins and Skenes: normal                 Vagina: normal appearing vagina with normal color and discharge, no lesions              Cervix: no lesions              Pap taken:  yes.  Bimanual Exam:  Uterus:  normal size, contour, position, consistency, mobility, non-tender              Adnexa: no mass, fullness, tenderness              Rectal exam: yes.  Confirms.              Anus:  normal sphincter tone, no lesions  Chaperone was present for exam:  Estill Bamberg, CMA.  Assessment:   Well woman visit with gynecologic exam. Recurrent UTI.  Osteopenia.  Fosamax use for 8 years.  Hx fracture of wrist.  Anticipated Prolia use. Hx anxiety and depression.  Medication monitoring encounter.  FH breast cancer in mother and sister.   Plan: Mammogram screening discussed. Self breast awareness reviewed. Pap and HR HPV as above. Guidelines for Calcium, Vitamin D, regular exercise program including cardiovascular and weight bearing exercise. Continue Wellbutrin XL 300 mg daily.  #90, RF:  3. Will stop Lexapro and start Prozac 10 mg daily.  #30, RF:   2. Patient will let me know how she is doing on the Prozac 10 mg daily in about 6 weeks.   Follow up annually and prn.   After visit summary provided.   29 min  total time was spent for this patient encounter, including preparation, face-to-face counseling with the patient, coordination of care, and documentation of the encounter.

## 2021-09-18 DIAGNOSIS — R2689 Other abnormalities of gait and mobility: Secondary | ICD-10-CM | POA: Diagnosis not present

## 2021-09-18 DIAGNOSIS — M25551 Pain in right hip: Secondary | ICD-10-CM | POA: Diagnosis not present

## 2021-09-19 DIAGNOSIS — M1611 Unilateral primary osteoarthritis, right hip: Secondary | ICD-10-CM | POA: Diagnosis not present

## 2021-09-20 DIAGNOSIS — R2689 Other abnormalities of gait and mobility: Secondary | ICD-10-CM | POA: Diagnosis not present

## 2021-09-20 DIAGNOSIS — M25551 Pain in right hip: Secondary | ICD-10-CM | POA: Diagnosis not present

## 2021-09-20 LAB — CYTOLOGY - PAP
Comment: NEGATIVE
Diagnosis: NEGATIVE
High risk HPV: NEGATIVE

## 2021-09-21 ENCOUNTER — Other Ambulatory Visit: Payer: Self-pay | Admitting: Family Medicine

## 2021-09-21 DIAGNOSIS — K219 Gastro-esophageal reflux disease without esophagitis: Secondary | ICD-10-CM

## 2021-09-25 DIAGNOSIS — R2689 Other abnormalities of gait and mobility: Secondary | ICD-10-CM | POA: Diagnosis not present

## 2021-09-25 DIAGNOSIS — M25551 Pain in right hip: Secondary | ICD-10-CM | POA: Diagnosis not present

## 2021-09-27 DIAGNOSIS — R2689 Other abnormalities of gait and mobility: Secondary | ICD-10-CM | POA: Diagnosis not present

## 2021-09-27 DIAGNOSIS — M25551 Pain in right hip: Secondary | ICD-10-CM | POA: Diagnosis not present

## 2021-10-02 DIAGNOSIS — M25551 Pain in right hip: Secondary | ICD-10-CM | POA: Diagnosis not present

## 2021-10-02 DIAGNOSIS — R2689 Other abnormalities of gait and mobility: Secondary | ICD-10-CM | POA: Diagnosis not present

## 2021-10-03 ENCOUNTER — Telehealth: Payer: Self-pay

## 2021-10-03 NOTE — Telephone Encounter (Signed)
Transition Care Management Unsuccessful Follow-up Telephone Call  Date of discharge and from where:  09/07/21 from [facility]  Attempts:  1st Attempt  Reason for unsuccessful TCM follow-up call:  Left voice message   Thea Silversmith, RN, MSN, BSN, [place] Care Management Coordinator [phone]

## 2021-10-04 ENCOUNTER — Telehealth: Payer: Self-pay

## 2021-10-04 DIAGNOSIS — M25551 Pain in right hip: Secondary | ICD-10-CM | POA: Diagnosis not present

## 2021-10-04 DIAGNOSIS — R2689 Other abnormalities of gait and mobility: Secondary | ICD-10-CM | POA: Diagnosis not present

## 2021-10-04 NOTE — Telephone Encounter (Signed)
Transition Care Management Unsuccessful Follow-up Telephone Call  Date of discharge and from where:  09/07/2021  [facility]  Attempts:  2nd Attempt  Reason for unsuccessful TCM follow-up call:  No answer/busy Tomasa Rand, RN, BSN, CEN [place] Coordinator [phone]

## 2021-10-05 ENCOUNTER — Other Ambulatory Visit: Payer: Self-pay

## 2021-10-05 ENCOUNTER — Encounter: Payer: Self-pay | Admitting: Internal Medicine

## 2021-10-05 ENCOUNTER — Ambulatory Visit: Payer: Medicare PPO | Admitting: Internal Medicine

## 2021-10-05 VITALS — BP 130/78 | HR 124 | Ht <= 58 in | Wt 126.8 lb

## 2021-10-05 DIAGNOSIS — M810 Age-related osteoporosis without current pathological fracture: Secondary | ICD-10-CM

## 2021-10-05 DIAGNOSIS — E559 Vitamin D deficiency, unspecified: Secondary | ICD-10-CM

## 2021-10-05 DIAGNOSIS — R000 Tachycardia, unspecified: Secondary | ICD-10-CM

## 2021-10-05 NOTE — Progress Notes (Addendum)
Patient ID: Jessica Casey, female   DOB: [birth year], 71 y.o.   MRN: [id number]   This visit occurred during the SARS-CoV-2 public health emergency.  Safety protocols were in place, including screening questions prior to the visit, additional usage of staff PPE, and extensive cleaning of exam room while observing appropriate contact time as indicated for disinfecting solutions.   HPI  Jessica Casey is a 71 y.o.-year-old female, initially referred by her OB/GYN doctor, Dr. Judeth Horn, returning for follow-up for osteopenia (Op) on DXA scans, but with clinical osteoporosis.  Last visit 1 year ago.  Interim history: No falls or fractures since last visit. No dizziness/vertigo/orthostasis/poor vision. On 09/04/2021 she had R hip replacement surgery 2/2 wearing off of her cartilage.  She developed nerve pain in the right knee afterwards and was started on Lyrica.  Pain is now less consistently.  Reviewed history: She was diagnosed with osteoporosis in 2012, then osteopenia in 2016.  However, due to history of fracture, she has clinical osteoporosis.  Reviewed previous DXA scan reports: Date L1-L4 T score FN T score 33% distal Radius  08/23/2019 ([facility]) n/a RFN: -1.9 LFN: -2.0 -1.4  08/12/2017 ([facility]) n/a RFN: -1.6 LFN: -1.6  -1.6  08/07/2015 ([facility]) n/a RFN: -1.9 LFN: -1.8  -0.7  05/07/2011 ([facility]) n/a RFN: -2.3 LFN: -2.5  -0.9   She has a history of: - wrist in 2015 (lost balance on concrete)  Osteoporosis treatments:  - Fosamax 70 mg weekly  - started 2012 - Prolia-started 03/07/2020 but she did not receive further doses She tolerated Prolia well, without jaw/hip/thigh pain.  She has a history of vitamin D deficiency: Lab Results  Component Value Date   VD25OH 76.41 10/03/2020   VD25OH 92.12 09/13/2019   VD25OH 79.96 02/10/2019   VD25OH 61 05/16/2014   VD25OH 28 (L) 11/01/2010   VD25OH 30 08/02/2009   She is on calcium and vitamin D: Vitamin D 2000 units  Qod now (decreased at last  OV) + multivitamin + calcium-vitamin D.  She was walking for exercise 1.65 miles 3-4 times a week.  As of now, she has been less active due to her surgery.  She has a h/o 3 back surgeries - cervical area and also spinal fusion.  She continues to have back pain.  She does not take high vitamin A doses.  Menopause was at 71 years old.  She was on HRT for 2 years. However, BrCA in sister and mother (Estrogen R +) >> she was taken off HRT.  FH of osteoporosis: sister - Op.  No history of kidney stones, persistent hyper or hypocalcemia: Lab Results  Component Value Date   CALCIUM 9.1 08/29/2021   CALCIUM 9.4 07/30/2021   CALCIUM 9.0 10/03/2020   CALCIUM 9.2 09/13/2019   CALCIUM 9.3 02/10/2019   CALCIUM 9.4 08/06/2016   CALCIUM 9.4 02/28/2016   CALCIUM 9.6 08/08/2015   CALCIUM 9.0 05/16/2014   CALCIUM 9.6 05/13/2013   No history of thyrotoxicosis: Lab Results  Component Value Date   TSH 2.04 08/06/2016   TSH 1.28 08/08/2015   TSH 2.109 05/16/2014   TSH 1.41 02/17/2013   TSH 1.57 11/01/2010   She is on biotin.  No history of CKD, but latest BUN/creatinine: Lab Results  Component Value Date   BUN 16 08/29/2021   CREATININE 0.85 08/29/2021   She has OA.  ROS: + See HPI Musculoskeletal: no muscle aches/+ joint aches (osteoarthritis)  I reviewed pt's medications, allergies, PMH, social hx, family hx, and  changes were documented in the history of present illness. Otherwise, unchanged from my initial visit note.  Past Medical History:  Diagnosis Date   Anxiety    Cataract    Colonic polyp 2005 & 2013   DDD (degenerative disc disease)    Depression    GERD (gastroesophageal reflux disease)    H/O echocardiogram    before 2000, no need for f/u /w cardiac    Heart murmur    MVP- echo- 2010, no symptoms    Memory disorder 09/19/2017   Mononucleosis 1971   Osteoporosis    Sleep apnea    Past Surgical History:  Procedure Laterality Date   ANTERIOR CERVICAL  DECOMP/DISCECTOMY FUSION N/A 05/20/2013   Procedure: ANTERIOR CERVICAL DECOMPRESSION/DISCECTOMY FUSION 2 LEVELS;  Surgeon: Erline Levine, MD;  Location: [facility];  Service: Neurosurgery;  Laterality: N/A;  Cervical Seven-Thoracic One, Thoracic One-Two Anterior cervical decompression/Diskectomy/Fusion   CARPAL TUNNEL RELEASE Right    CATARACT EXTRACTION, BILATERAL  10/01/2011   Dr Gershon Crane, [place]     2 proceduresr Vertell Limber   colonoscopy with polypectomy  10/01/2011   Dr Deatra Ina   FRACTURE SURGERY     L wrist - June 2014   KNEE SURGERY     Dr Wonda Olds ; bursa cystectomy   LUMBAR DISC SURGERY     Dr.Stern   ROTATOR CUFF REPAIR     Dr.Wainer   TOTAL HIP ARTHROPLASTY Right 09/04/2021   Procedure: TOTAL HIP ARTHROPLASTY ANTERIOR APPROACH;  Surgeon: Renette Butters, MD;  Location: [facility];  Service: Orthopedics;  Laterality: Right;   UMBILICAL HERNIA REPAIR     as a baby   WRIST FRACTURE SURGERY Left 02/28/2013   Dr. Kathryne Hitch   Social History   Socioeconomic History   Marital status: Married    Spouse name: Not on file   Number of children: 2   Years of education: 16+   Highest education level: Not on file  Occupational History   Occupation: Music therapist- Retired  Tobacco Use   Smoking status: Never Smoker   Smokeless tobacco: Never Used  Substance and Sexual Activity   Alcohol use: No    Alcohol/week: 0.0 standard drinks   Drug use: No   Sexual activity: Not Currently    Partners: Male    Birth control/protection: Post-menopausal  Other Topics Concern   Not on file  Social History Narrative   Lives   Caffeine use:    Right handed    Social Determinants of Health   Financial Resource Strain:    Difficulty of Paying Living Expenses: Not on file  Food Insecurity:    Worried About [place] in the Last Year: Not on file   [employer] of Food in the Last Year: Not on file  Transportation Needs:    Lack of Transportation (Medical): Not on file    Lack of Transportation (Non-Medical): Not on file  Physical Activity:    Days of Exercise per Week: Not on file   Minutes of Exercise per Session: Not on file  Stress:    Feeling of Stress : Not on file  Social Connections:    Frequency of Communication with Friends and Family: Not on file   Frequency of Social Gatherings with Friends and Family: Not on file   Attends Religious Services: Not on file   Active Member of Clubs or Organizations: Not on file   Attends Archivist Meetings: Not on file  Marital Status: Not on file  Intimate Partner Violence:    Fear of Current or Ex-Partner: Not on file   Emotionally Abused: Not on file   Physically Abused: Not on file   Sexually Abused: Not on file   Current Outpatient Medications on File Prior to Visit  Medication Sig Dispense Refill   acetaminophen (TYLENOL) 650 MG CR tablet Take 1,300 mg by mouth every 8 (eight) hours as needed for pain.     aspirin EC 81 MG tablet Take 1 tablet (81 mg total) by mouth 2 (two) times daily. For DVT prophylaxis for 30 days after surgery. 60 tablet 0   BIOTIN PO Take 1 tablet by mouth daily.     buPROPion (WELLBUTRIN XL) 300 MG 24 hr tablet Take 1 tablet (300 mg total) by mouth daily. 90 tablet 3   Calcium Carb-Cholecalciferol (CALCIUM 600 + D PO) Take 1 tablet by mouth daily.     Cholecalciferol (VITAMIN D) 2000 UNITS CAPS Take 2,000 Units by mouth daily.     Cyanocobalamin (VITAMIN B-12) 1000 MCG/15ML LIQD      diclofenac sodium (VOLTAREN) 1 % GEL Apply 2 g topically 4 (four) times daily. (Patient taking differently: Apply 2 g topically 4 (four) times daily as needed (pain).) 3 Tube 3   FLUoxetine (PROZAC) 10 MG tablet Take 1 tablet (10 mg total) by mouth daily. 30 tablet 2   Melatonin 10 MG TABS Take 10 mg by mouth at bedtime as needed (sleep).     Multiple Vitamin (MULTIVITAMIN WITH MINERALS) TABS tablet Take 1 tablet by mouth daily.     omeprazole (PRILOSEC) 40 MG capsule TAKE 1 CAPSULE  (40 MG TOTAL) BY MOUTH DAILY. 30 capsule 1   ondansetron (ZOFRAN-ODT) 4 MG disintegrating tablet Take 1 tablet (4 mg total) by mouth 2 (two) times daily as needed for nausea or vomiting. 10 tablet 0   oxyCODONE (ROXICODONE) 5 MG immediate release tablet Take 1 tablet (5 mg total) by mouth every 6 (six) hours as needed for severe pain. Do not take more than 6 tablets in a 24 hour period. 28 tablet 0   predniSONE (DELTASONE) 10 MG tablet Take 1 tablet (10 mg total) by mouth with breakfast, with lunch, and with evening meal. 30 tablet 0   pregabalin (LYRICA) 50 MG capsule Take 1 capsule (50 mg total) by mouth 3 (three) times daily. 60 capsule 0   No current facility-administered medications on file prior to visit.   Allergies  Allergen Reactions   Gabapentin     Tongue swelling and itching    Family History  Problem Relation Age of Onset   Diabetes Father    Coronary artery disease Father        S/P CBAG , no MI -- Dec   Depression Father    Diverticulosis Father    Breast cancer Mother        estorgen receptor positive   Stroke Mother 83       Dec   Breast cancer Sister        estrogen receptor positive   Depression Sister    Depression Sister    Depression Sister    Depression Sister    Depression Brother    Bipolar disorder Other        Neice   Colon polyps Sister    Colon cancer Neg Hx    Esophageal cancer Neg Hx    Pancreatic cancer Neg Hx    Rectal cancer Neg Hx  Stomach cancer Neg Hx     PE: BP 130/78 (BP Location: Right Arm, Patient Position: Sitting, Cuff Size: Normal)    Pulse (!) 124    Ht 4' 9"  (1.448 m)    Wt 126 lb 12.8 oz (57.5 kg)    LMP 10/01/1995 (Approximate)    SpO2 97%    BMI 27.44 kg/m  Wt Readings from Last 3 Encounters:  10/05/21 126 lb 12.8 oz (57.5 kg)  09/17/21 125 lb (56.7 kg)  09/04/21 126 lb 1.7 oz (57.2 kg)   Constitutional: normal weight, in NAD Eyes: PERRLA, EOMI, no exophthalmos ENT: moist mucous membranes, no thyromegaly, no  cervical lymphadenopathy Cardiovascular: Tachycardia RR, No MRG Respiratory: CTA B Musculoskeletal: no deformities, strength intact in all 4 Skin: moist, warm, no rashes Neurological: no tremor with outstretched hands, DTR normal in all 4  Assessment: 1. Clinical Osteoporosis  2.  Vitamin D deficiency  3.  Tachycardia  Plan: 1. Osteoporosis -Likely postmenopausal that she had early menopause, and also age-related; she also has family history of osteopenia -We reviewed her previous bone density scans: Before starting Prolia, her femoral neck T-scores worsened.  She also had a fragility fracture in 2015.  Therefore, overall, she can be considered to have clinical osteoporosis and is at an increased risk for fracture -She is aware about fall precautions -she is trying to get 1000 to 1200 mg of calcium daily preferentially from the diet -Previously on Fosamax, but we were able to start Prolia on 03/07/2020.  She tolerated it well, without jaw pain, hip or thigh pain.  However, she was not able to obtain it afterwards due to being very busy with her husband being sick and with her taking care of her daughter's children.  She would like to restart it, though.  We discussed that she cannot delay the injections by more than a month, since otherwise she would be at high risk for fractures. - I again recommended weightbearing exercises after surgery heals -We will order another bone density scan today -At today's visit we will check another vitamin D level.  Her calcium and kidney function were recently normal.  We will repeat this so that we can send a PA for Prolia again. -I will see her back in a year  2.  Vitamin D deficiency -on supplementation for many years, but currently on ~3000 units daily -Vitamin D level from 09/2020 was normal, at 76.41 -We will recheck this today  3.  Tachycardia -Chronic, predating her surgery per her report, however, I reviewed my last note and her pulse was 71 at  that time -Not associated with chest pain -We will check a TSH today.  Latest was normal in 2017.  Needs PA for Prolia.  Component     Latest Ref Rng & Units 10/05/2021  Glucose     70 - 99 mg/dL 83  BUN     8 - 27 mg/dL 20  Creatinine     0.57 - 1.00 mg/dL 0.99  eGFR     >59 mL/min/1.73 61  BUN/Creatinine Ratio     12 - 28 20  Sodium     134 - 144 mmol/L 142  Potassium     3.5 - 5.2 mmol/L 4.9  Chloride     96 - 106 mmol/L 106  CO2     20 - 29 mmol/L 24  Calcium     8.7 - 10.3 mg/dL 9.6  Vitamin D, 25-Hydroxy     30.0 - 100.0  ng/mL 56.5  TSH     0.450 - 4.500 uIU/mL 4.070  Labs are all normal.  We will initiate a PA for Prolia.   L1-L4 T score FN T score 33% distal Radius FRAX score  10/19/2021 ([facility]) n/a RFN: ?, prev.-1.9 LFN: -2.0, prev.-2.0 -0.9, prev. -1.4  10-year major osteoporotic fracture: 12% Hip fracture: 2.4%   Will need to ask for the full report.  As of now, I only received the physician read.  Philemon Kingdom, MD PhD [facility] Endocrinology

## 2021-10-05 NOTE — Patient Instructions (Addendum)
We will need a new bone density scan.  Please stop at the lab.  Let's restart Prolia for now.  Please come back for a follow-up appointment in 1 year.

## 2021-10-06 ENCOUNTER — Encounter: Payer: Self-pay | Admitting: Pulmonary Disease

## 2021-10-06 LAB — BASIC METABOLIC PANEL
BUN/Creatinine Ratio: 20 (ref 12–28)
BUN: 20 mg/dL (ref 8–27)
CO2: 24 mmol/L (ref 20–29)
Calcium: 9.6 mg/dL (ref 8.7–10.3)
Chloride: 106 mmol/L (ref 96–106)
Creatinine, Ser: 0.99 mg/dL (ref 0.57–1.00)
Glucose: 83 mg/dL (ref 70–99)
Potassium: 4.9 mmol/L (ref 3.5–5.2)
Sodium: 142 mmol/L (ref 134–144)
eGFR: 61 mL/min/{1.73_m2} (ref >59)

## 2021-10-06 LAB — TSH: TSH: 4.07 u[IU]/mL (ref 0.450–4.500)

## 2021-10-06 LAB — VITAMIN D 25 HYDROXY (VIT D DEFICIENCY, FRACTURES): Vit D, 25-Hydroxy: 56.5 ng/mL (ref 30.0–100.0)

## 2021-10-08 NOTE — Telephone Encounter (Signed)
I have called [facility] 2 times today and was on hold for over 10 minutes and I have let patient know that I am working on getting her machine. She was appreciative of the call.

## 2021-10-09 ENCOUNTER — Ambulatory Visit: Payer: Medicare PPO | Admitting: Internal Medicine

## 2021-10-09 NOTE — Telephone Encounter (Signed)
I called [facility] and they need updated notes and a new form that is being faxed to the office to sign.

## 2021-10-10 ENCOUNTER — Telehealth: Payer: Self-pay

## 2021-10-10 ENCOUNTER — Other Ambulatory Visit: Payer: Self-pay

## 2021-10-10 ENCOUNTER — Other Ambulatory Visit: Payer: Self-pay | Admitting: Obstetrics and Gynecology

## 2021-10-10 DIAGNOSIS — K219 Gastro-esophageal reflux disease without esophagitis: Secondary | ICD-10-CM

## 2021-10-10 MED ORDER — OMEPRAZOLE 40 MG PO CPDR: 40.0000 mg | DELAYED_RELEASE_CAPSULE | Freq: Every day | ORAL | 1 refills | Status: DC

## 2021-10-10 NOTE — Telephone Encounter (Signed)
Prolia VOB initiated via [email or web address]  Last OV:  Next OV:  Last Prolia inj:  Next Prolia inj DUE: RE-START

## 2021-10-11 DIAGNOSIS — M25551 Pain in right hip: Secondary | ICD-10-CM | POA: Diagnosis not present

## 2021-10-11 DIAGNOSIS — R2689 Other abnormalities of gait and mobility: Secondary | ICD-10-CM | POA: Diagnosis not present

## 2021-10-16 DIAGNOSIS — R2689 Other abnormalities of gait and mobility: Secondary | ICD-10-CM | POA: Diagnosis not present

## 2021-10-16 DIAGNOSIS — M25551 Pain in right hip: Secondary | ICD-10-CM | POA: Diagnosis not present

## 2021-10-18 DIAGNOSIS — R2689 Other abnormalities of gait and mobility: Secondary | ICD-10-CM | POA: Diagnosis not present

## 2021-10-18 DIAGNOSIS — M25551 Pain in right hip: Secondary | ICD-10-CM | POA: Diagnosis not present

## 2021-10-19 DIAGNOSIS — M85852 Other specified disorders of bone density and structure, left thigh: Secondary | ICD-10-CM | POA: Diagnosis not present

## 2021-10-19 DIAGNOSIS — Z780 Asymptomatic menopausal state: Secondary | ICD-10-CM | POA: Diagnosis not present

## 2021-10-22 DIAGNOSIS — H35363 Drusen (degenerative) of macula, bilateral: Secondary | ICD-10-CM | POA: Diagnosis not present

## 2021-10-22 DIAGNOSIS — Z961 Presence of intraocular lens: Secondary | ICD-10-CM | POA: Diagnosis not present

## 2021-10-22 DIAGNOSIS — H52223 Regular astigmatism, bilateral: Secondary | ICD-10-CM | POA: Diagnosis not present

## 2021-10-23 NOTE — Telephone Encounter (Signed)
Prior auth required for PROLIA ? ?PA PROCESS DETAILS: PA is required. PA can be initiated by calling [phone] or online at ?[email or web address] ? ?

## 2021-10-24 NOTE — Telephone Encounter (Signed)
Prior auth initiated via [email or web address] KEY: [id number]

## 2021-10-26 ENCOUNTER — Encounter: Payer: Self-pay | Admitting: Internal Medicine

## 2021-10-27 NOTE — Telephone Encounter (Signed)
Pt ready for scheduling on or after 10/27/21  Out-of-pocket cost due at time of visit: $0  Primary: [employer] Prolia co-insurance: 0% Admin fee co-insurance: 0%  Secondary: n/a Prolia co-insurance:  Admin fee co-insurance:   Deductible: does not apply  Prior Auth: APPROVED PA# [id number] Valid: 10/24/21-09/29/22    ** This summary of benefits is an estimation of the patient's out-of-pocket cost. Exact cost may very based on individual plan coverage.

## 2021-10-29 ENCOUNTER — Encounter: Payer: Self-pay | Admitting: Obstetrics and Gynecology

## 2021-10-29 NOTE — Addendum Note (Signed)
Addended by: Philemon Kingdom on: 10/29/2021 04:46 PM   Modules accepted: Orders

## 2021-11-09 ENCOUNTER — Ambulatory Visit (INDEPENDENT_AMBULATORY_CARE_PROVIDER_SITE_OTHER): Payer: Medicare PPO

## 2021-11-09 ENCOUNTER — Encounter: Payer: Self-pay | Admitting: Obstetrics and Gynecology

## 2021-11-09 ENCOUNTER — Other Ambulatory Visit: Payer: Self-pay

## 2021-11-09 DIAGNOSIS — M810 Age-related osteoporosis without current pathological fracture: Secondary | ICD-10-CM | POA: Diagnosis not present

## 2021-11-09 MED ORDER — DENOSUMAB 60 MG/ML ~~LOC~~ SOSY
60.0000 mg | PREFILLED_SYRINGE | Freq: Once | SUBCUTANEOUS | Status: AC
  Administered 2021-11-09: 60 mg via SUBCUTANEOUS

## 2021-11-09 NOTE — Progress Notes (Signed)
Pt verbally consented to Prolia injection which was administered to pt's left arm. Pt tolerated well.

## 2021-11-10 ENCOUNTER — Other Ambulatory Visit: Payer: Self-pay | Admitting: Obstetrics and Gynecology

## 2021-11-10 MED ORDER — FLUOXETINE HCL 10 MG PO TABS: 10.0000 mg | ORAL_TABLET | Freq: Every day | ORAL | 3 refills | Status: DC

## 2021-11-10 NOTE — Progress Notes (Unsigned)
Refill of Prozac 10 mg, 3 months, with 3 refills.

## 2021-11-19 DIAGNOSIS — X32XXXD Exposure to sunlight, subsequent encounter: Secondary | ICD-10-CM | POA: Diagnosis not present

## 2021-11-19 DIAGNOSIS — L570 Actinic keratosis: Secondary | ICD-10-CM | POA: Diagnosis not present

## 2021-11-21 NOTE — Telephone Encounter (Signed)
Last Prolia inj 11/09/21 Next Prolia inj due 05/10/22

## 2021-12-10 ENCOUNTER — Ambulatory Visit (INDEPENDENT_AMBULATORY_CARE_PROVIDER_SITE_OTHER): Payer: Medicare PPO

## 2021-12-10 VITALS — Ht <= 58 in | Wt 116.0 lb

## 2021-12-10 DIAGNOSIS — Z0000 Encounter for general adult medical examination without abnormal findings: Secondary | ICD-10-CM

## 2021-12-10 NOTE — Patient Instructions (Signed)
Jessica Casey , Thank you for taking time to come for your Medicare Wellness Visit. I appreciate your ongoing commitment to your health goals. Please review the following plan we discussed and let me know if I can assist you in the future.   Screening recommendations/referrals: Colonoscopy: completed 07/17/2017, due 07/17/2022 Mammogram: completed 05/28/2021, due 05/29/2022 Bone Density: completed 10/29/2021 Recommended yearly ophthalmology/optometry visit for glaucoma screening and checkup Recommended yearly dental visit for hygiene and checkup  Vaccinations: Influenza vaccine: completed 06/23/2021, due next flu season Pneumococcal vaccine:  due Tdap vaccine: completed 06/14/2019 Shingles vaccine: completed    Covid-19:06/23/2021, 06/25/2020, 11/11/2019, 10/21/2019  Advanced directives: Please bring a copy of your POA (Power of Attorney) and/or Living Will to your next appointment.   Conditions/risks identified: none  Next appointment: Follow up in one year for your annual wellness visit    Preventive Care 65 Years and Older, Female Preventive care refers to lifestyle choices and visits with your health care provider that can promote health and wellness. What does preventive care include? A yearly physical exam. This is also called an annual well check. Dental exams once or twice a year. Routine eye exams. Ask your health care provider how often you should have your eyes checked. Personal lifestyle choices, including: Daily care of your teeth and gums. Regular physical activity. Eating a healthy diet. Avoiding tobacco and drug use. Limiting alcohol use. Practicing safe sex. Taking low-dose aspirin every day. Taking vitamin and mineral supplements as recommended by your health care provider. What happens during an annual well check? The services and screenings done by your health care provider during your annual well check will depend on your age, overall health, lifestyle risk factors, and  family history of disease. Counseling  Your health care provider may ask you questions about your: Alcohol use. Tobacco use. Drug use. Emotional well-being. Home and relationship well-being. Sexual activity. Eating habits. History of falls. Memory and ability to understand (cognition). Work and work Statistician. Reproductive health. Screening  You may have the following tests or measurements: Height, weight, and BMI. Blood pressure. Lipid and cholesterol levels. These may be checked every 5 years, or more frequently if you are over 53 years old. Skin check. Lung cancer screening. You may have this screening every year starting at age 53 if you have a 30-pack-year history of smoking and currently smoke or have quit within the past 15 years. Fecal occult blood test (FOBT) of the stool. You may have this test every year starting at age 42. Flexible sigmoidoscopy or colonoscopy. You may have a sigmoidoscopy every 5 years or a colonoscopy every 10 years starting at age 24. Hepatitis C blood test. Hepatitis B blood test. Sexually transmitted disease (STD) testing. Diabetes screening. This is done by checking your blood sugar (glucose) after you have not eaten for a while (fasting). You may have this done every 1-3 years. Bone density scan. This is done to screen for osteoporosis. You may have this done starting at age 53. Mammogram. This may be done every 1-2 years. Talk to your health care provider about how often you should have regular mammograms. Talk with your health care provider about your test results, treatment options, and if necessary, the need for more tests. Vaccines  Your health care provider may recommend certain vaccines, such as: Influenza vaccine. This is recommended every year. Tetanus, diphtheria, and acellular pertussis (Tdap, Td) vaccine. You may need a Td booster every 10 years. Zoster vaccine. You may need this after age 27.  Pneumococcal 13-valent conjugate  (PCV13) vaccine. One dose is recommended after age 66. Pneumococcal polysaccharide (PPSV23) vaccine. One dose is recommended after age 29. Talk to your health care provider about which screenings and vaccines you need and how often you need them. This information is not intended to replace advice given to you by your health care provider. Make sure you discuss any questions you have with your health care provider. Document Released: 10/13/2015 Document Revised: 06/05/2016 Document Reviewed: 07/18/2015 Elsevier Interactive Patient Education  2017 [place] Prevention in the Home Falls can cause injuries. They can happen to people of all ages. There are many things you can do to make your home safe and to help prevent falls. What can I do on the outside of my home? Regularly fix the edges of walkways and driveways and fix any cracks. Remove anything that might make you trip as you walk through a door, such as a raised step or threshold. Trim any bushes or trees on the path to your home. Use bright outdoor lighting. Clear any walking paths of anything that might make someone trip, such as rocks or tools. Regularly check to see if handrails are loose or broken. Make sure that both sides of any steps have handrails. Any raised decks and porches should have guardrails on the edges. Have any leaves, snow, or ice cleared regularly. Use sand or salt on walking paths during winter. Clean up any spills in your garage right away. This includes oil or grease spills. What can I do in the bathroom? Use night lights. Install grab bars by the toilet and in the tub and shower. Do not use towel bars as grab bars. Use non-skid mats or decals in the tub or shower. If you need to sit down in the shower, use a plastic, non-slip stool. Keep the floor dry. Clean up any water that spills on the floor as soon as it happens. Remove soap buildup in the tub or shower regularly. Attach bath mats securely with  double-sided non-slip rug tape. Do not have throw rugs and other things on the floor that can make you trip. What can I do in the bedroom? Use night lights. Make sure that you have a light by your bed that is easy to reach. Do not use any sheets or blankets that are too big for your bed. They should not hang down onto the floor. Have a firm chair that has side arms. You can use this for support while you get dressed. Do not have throw rugs and other things on the floor that can make you trip. What can I do in the kitchen? Clean up any spills right away. Avoid walking on wet floors. Keep items that you use a lot in easy-to-reach places. If you need to reach something above you, use a strong step stool that has a grab bar. Keep electrical cords out of the way. Do not use floor polish or wax that makes floors slippery. If you must use wax, use non-skid floor wax. Do not have throw rugs and other things on the floor that can make you trip. What can I do with my stairs? Do not leave any items on the stairs. Make sure that there are handrails on both sides of the stairs and use them. Fix handrails that are broken or loose. Make sure that handrails are as long as the stairways. Check any carpeting to make sure that it is firmly attached to the stairs. Fix any  carpet that is loose or worn. Avoid having throw rugs at the top or bottom of the stairs. If you do have throw rugs, attach them to the floor with carpet tape. Make sure that you have a light switch at the top of the stairs and the bottom of the stairs. If you do not have them, ask someone to add them for you. What else can I do to help prevent falls? Wear shoes that: Do not have high heels. Have rubber bottoms. Are comfortable and fit you well. Are closed at the toe. Do not wear sandals. If you use a stepladder: Make sure that it is fully opened. Do not climb a closed stepladder. Make sure that both sides of the stepladder are locked  into place. Ask someone to hold it for you, if possible. Clearly mark and make sure that you can see: Any grab bars or handrails. First and last steps. Where the edge of each step is. Use tools that help you move around (mobility aids) if they are needed. These include: Canes. Walkers. Scooters. Crutches. Turn on the lights when you go into a dark area. Replace any light bulbs as soon as they burn out. Set up your furniture so you have a clear path. Avoid moving your furniture around. If any of your floors are uneven, fix them. If there are any pets around you, be aware of where they are. Review your medicines with your doctor. Some medicines can make you feel dizzy. This can increase your chance of falling. Ask your doctor what other things that you can do to help prevent falls. This information is not intended to replace advice given to you by your health care provider. Make sure you discuss any questions you have with your health care provider. Document Released: 07/13/2009 Document Revised: 02/22/2016 Document Reviewed: 10/21/2014 Elsevier Interactive Patient Education  2017 [employer].

## 2021-12-10 NOTE — Progress Notes (Cosign Needed)
I connected with  Jessica Casey today via telehealth video enabled device and verified that I am speaking with the correct person using two identifiers.   Location: Patient: home Provider: work  Persons participating in virtual visit: Peggye Form, Glenna Durand LPN  I discussed the limitations, risks, security and privacy concerns of performing an evaluation and management service by video and the availability of in person appointments. The patient expressed understanding and agreed to proceed.   Some vital signs may be absent or patient reported.     Subjective:   Jessica Casey is a 71 y.o. female who presents for Medicare Annual (Subsequent) preventive examination.  Review of Systems     Cardiac Risk Factors include: advanced age (>58mn, >>76women)     Objective:    Today's Vitals   12/10/21 1255  Weight: 116 lb (52.6 kg)  Height: '4\' 10"'$  (1.473 m)   Body mass index is 24.24 kg/m.  Advanced Directives 12/10/2021 09/04/2021 08/29/2021 12/13/2020 02/04/2018 07/08/2017 05/13/2013  Does Patient Have a Medical Advance Directive? Yes Yes Yes Yes Yes Yes Patient has advance directive, copy not in chart  Type of Advance Directive HUniop[place]Living will HCarmenLiving will HArabiLiving will Living will;Healthcare Power of [place]edoLiving will Living will  Does patient want to make changes to medical advance directive? - No - Guardian declined - - - - -  Copy of [place] Chart? No - copy requested - - No - copy requested - - -    Current Medications (verified) Outpatient Encounter Medications as of 12/10/2021  Medication Sig   acetaminophen (TYLENOL) 650 MG CR tablet Take 1,300 mg by mouth every 8 (eight) hours as needed for pain.   BIOTIN PO Take 1 tablet by mouth daily.   buPROPion (WELLBUTRIN XL) 300 MG 24 hr tablet Take 1 tablet (300 mg total) by mouth daily.    Calcium Carb-Cholecalciferol (CALCIUM 600 + D PO) Take 1 tablet by mouth daily.   Cholecalciferol (VITAMIN D) 2000 UNITS CAPS Take 2,000 Units by mouth daily.   Cyanocobalamin (VITAMIN B-12) 1000 MCG/15ML LIQD    diclofenac sodium (VOLTAREN) 1 % GEL Apply 2 g topically 4 (four) times daily. (Patient taking differently: Apply 2 g topically 4 (four) times daily as needed (pain).)   FLUoxetine (PROZAC) 10 MG tablet Take 1 tablet (10 mg total) by mouth daily.   Melatonin 10 MG TABS Take 10 mg by mouth at bedtime as needed (sleep).   Multiple Vitamin (MULTIVITAMIN WITH MINERALS) TABS tablet Take 1 tablet by mouth daily.   omeprazole (PRILOSEC) 40 MG capsule Take 1 capsule (40 mg total) by mouth daily.   No facility-administered encounter medications on file as of 12/10/2021.    Allergies (verified) Gabapentin   History: Past Medical History:  Diagnosis Date   Anxiety    Cataract    Colonic polyp 2005 & 2013   DDD (degenerative disc disease)    Depression    GERD (gastroesophageal reflux disease)    H/O echocardiogram    before 2000, no need for f/u /w cardiac    Heart murmur    MVP- echo- 2010, no symptoms    Memory disorder 09/19/2017   Mononucleosis 1971   Osteoporosis    Sleep apnea    Past Surgical History:  Procedure Laterality Date   ANTERIOR CERVICAL DECOMP/DISCECTOMY FUSION N/A 05/20/2013   Procedure: ANTERIOR CERVICAL DECOMPRESSION/DISCECTOMY FUSION 2 LEVELS;  Surgeon: Erline Levine, MD;  Location: [facility];  Service: Neurosurgery;  Laterality: N/A;  Cervical Seven-Thoracic One, Thoracic One-Two Anterior cervical decompression/Diskectomy/Fusion   CARPAL TUNNEL RELEASE Right    CATARACT EXTRACTION, BILATERAL  10/01/2011   Dr Gershon Crane, [place]     2 proceduresr Vertell Limber   colonoscopy with polypectomy  10/01/2011   Dr Deatra Ina   FRACTURE SURGERY     L wrist - June 2014   KNEE SURGERY     Dr Wonda Olds ; bursa cystectomy   LUMBAR DISC SURGERY     Dr.Stern    ROTATOR CUFF REPAIR     Dr.Wainer   TOTAL HIP ARTHROPLASTY Right 09/04/2021   Procedure: TOTAL HIP ARTHROPLASTY ANTERIOR APPROACH;  Surgeon: Renette Butters, MD;  Location: [facility];  Service: Orthopedics;  Laterality: Right;   UMBILICAL HERNIA REPAIR     as a baby   WRIST FRACTURE SURGERY Left 02/28/2013   Dr. Kathryne Hitch   Family History  Problem Relation Age of Onset   Diabetes Father    Coronary artery disease Father        S/P CBAG , no MI -- Dec   Depression Father    Diverticulosis Father    Breast cancer Mother        estorgen receptor positive   Stroke Mother 18       Dec   Breast cancer Sister        estrogen receptor positive   Depression Sister    Depression Sister    Depression Sister    Depression Sister    Depression Brother    Bipolar disorder Other        Neice   Colon polyps Sister    Colon cancer Neg Hx    Esophageal cancer Neg Hx    Pancreatic cancer Neg Hx    Rectal cancer Neg Hx    Stomach cancer Neg Hx    Social History   Socioeconomic History   Marital status: Married    Spouse name: Not on file   Number of children: Not on file   Years of education: 16+   Highest education level: Not on file  Occupational History   Occupation: Pharmacist, hospital- Retired  Tobacco Use   Smoking status: Never   Smokeless tobacco: Never  Scientific laboratory technician Use: Never used  Substance and Sexual Activity   Alcohol use: No    Alcohol/week: 0.0 standard drinks   Drug use: No   Sexual activity: Not Currently    Partners: Male    Birth control/protection: Post-menopausal  Other Topics Concern   Not on file  Social History Narrative   Lives   Caffeine use:    Right handed    Social Determinants of Health   Financial Resource Strain: Low Risk    Difficulty of Paying Living Expenses: Not hard at all  Food Insecurity: No Food Insecurity   Worried About Charity fundraiser in the Last Year: Never true   [place] in the Last Year: Never true   Transportation Needs: No Transportation Needs   Lack of Transportation (Medical): No   Lack of Transportation (Non-Medical): No  Physical Activity: Insufficiently Active   Days of Exercise per Week: 2 days   Minutes of Exercise per Session: 30 min  Stress: No Stress Concern Present   Feeling of Stress : Not at all  Social Connections: Socially Integrated   Frequency of Communication with Friends  and Family: More than three times a week   Frequency of Social Gatherings with Friends and Family: More than three times a week   Attends Religious Services: More than 4 times per year   Active Member of [employer] or Organizations: Yes   Attends Archivist Meetings: 1 to 4 times per year   Marital Status: Married    Tobacco Counseling Counseling given: Not Answered   Clinical Intake:  Pre-visit preparation completed: Yes  Pain : No/denies pain     Nutritional Status: BMI of 19-24  Normal Nutritional Risks: None Diabetes: No  How often do you need to have someone help you when you read instructions, pamphlets, or other written materials from your doctor or pharmacy?: 1 - Never  Diabetic? no  Interpreter Needed?: No  Information entered by :: NAllen LPN   Activities of Daily Living In your present state of health, do you have any difficulty performing the following activities: 12/10/2021 09/04/2021  Hearing? N Y  Comment has hearing aides -  Vision? N N  Difficulty concentrating or making decisions? N N  Walking or climbing stairs? N Y  Comment - -  Dressing or bathing? N N  Doing errands, shopping? N N  Preparing Food and eating ? N -  Using the Toilet? N -  In the past six months, have you accidently leaked urine? Y -  Do you have problems with loss of bowel control? N -  Managing your Medications? N -  Managing your Finances? N -  Housekeeping or managing your Housekeeping? N -  Some recent data might be hidden    Patient Care Team: [name], [name], MD as  PCP - General (Family Medicine) Nunzio Cobbs, MD as Consulting Physician (Obstetrics and Gynecology)  Indicate any recent Medical Services you may have received from other than [facility] providers in the past year (date may be approximate).     Assessment:   This is a routine wellness examination for [facility].  Hearing/Vision screen Vision Screening - Comments:: Regular eye exams, in [place] issues and exercise activities discussed: Current Exercise Habits: Home exercise routine, Type of exercise: walking, Time (Minutes): 30, Frequency (Times/Week): 2, Weekly Exercise (Minutes/Week): 60   Goals Addressed             This Visit's Progress    Patient Stated       12/10/2021, increase exercise       Depression Screen PHQ 2/9 Scores 12/10/2021 12/13/2020 02/09/2019 02/04/2018  PHQ - 2 Score 0 0 0 0    Fall Risk Fall Risk  12/10/2021 12/06/2021 12/13/2020 02/09/2019 02/04/2018  Falls in the past year? 0 0 0 0 No  Number falls in past yr: - - 0 0 -  Injury with Fall? - - 0 0 -  Risk for fall due to : Medication side effect - - Orthopedic patient -  Follow up Falls evaluation completed;Education provided;Falls prevention discussed - Falls prevention discussed Education provided -    FALL RISK PREVENTION PERTAINING TO THE HOME:  Any stairs in or around the home? Yes  If so, are there any without handrails? No  Home free of loose throw rugs in walkways, pet beds, electrical cords, etc? Yes  Adequate lighting in your home to reduce risk of falls? Yes   ASSISTIVE DEVICES UTILIZED TO PREVENT FALLS:  Life alert? No  Use of a cane, walker or w/c? No  Grab bars in the bathroom?  Yes  Shower chair or bench in shower? Yes  Elevated toilet seat or a handicapped toilet? Yes   TIMED UP AND GO:  Was the test performed? No .      Cognitive Function: MMSE - Mini Mental State Exam 02/04/2018 09/19/2017  Not completed: (No Data) -  Orientation to time - 5   Orientation to Place - 5  Registration - 3  Attention/ Calculation - 5  Recall - 3  Language- name 2 objects - 2  Language- repeat - 1  Language- follow 3 step command - 3  Language- read & follow direction - 1  Write a sentence - 1  Copy design - 1  Total score - 30     6CIT Screen 12/13/2020  What Year? 0 points  What month? 0 points  Count back from 20 0 points  Months in reverse 0 points  Repeat phrase 0 points    Immunizations Immunization History  Administered Date(s) Administered   Influenza, High Dose Seasonal PF 06/10/2016, 06/14/2018, 06/14/2019, 06/23/2021   Influenza-Unspecified 06/10/2016, 07/06/2017, 06/14/2018, 06/14/2019, 06/25/2020   PFIZER Comirnaty(Gray Top)Covid-19 Tri-Sucrose Vaccine 10/21/2019, 11/11/2019, 06/25/2020   Pfizer Covid-19 Vaccine Bivalent Booster 36yr & up 06/23/2021   Pneumococcal Conjugate-13 02/04/2018   Td 06/09/2007   Tdap 06/14/2019   Unspecified SARS-COV-2 Vaccination 06/23/2021   Zoster Recombinat (Shingrix) 07/06/2017, 09/28/2017    TDAP status: Up to date  Flu Vaccine status: Up to date  Pneumococcal vaccine status: Due, Education has been provided regarding the importance of this vaccine. Advised may receive this vaccine at local pharmacy or Health Dept. Aware to provide a copy of the vaccination record if obtained from local pharmacy or Health Dept. Verbalized acceptance and understanding.  Covid-19 vaccine status: Completed vaccines  Qualifies for Shingles Vaccine? Yes   Zostavax completed No   Shingrix Completed?: Yes  Screening Tests Health Maintenance  Topic Date Due   Pneumonia Vaccine 72 Years old (2 - PPSV23 if available, else PCV20) 02/05/2019   COVID-19 Vaccine (5 - Booster for POrrv[place]eries) 08/18/2021   COLONOSCOPY (Pts 45-481yrInsurance coverage will need to be confirmed)  07/17/2022   MAMMOGRAM  05/29/2023   INFLUENZA VACCINE  Completed   DEXA SCAN  Completed   Hepatitis C Screening  Completed    Zoster Vaccines- Shingrix  Completed   HPV VACCINES  Aged Out   TETANUS/TDAP  Discontinued    Health Maintenance  Health Maintenance Due  Topic Date Due   Pneumonia Vaccine 6532Years old (2 - PPSV23 if available, else PCV20) 02/05/2019   COVID-19 Vaccine (5 - Booster for PfLong Holloweries) 08/18/2021    Colorectal cancer screening: Type of screening: Colonoscopy. Completed 07/17/2017. Repeat every 5 years  Mammogram status: Completed 05/28/2021. Repeat every year  Bone Density status: Completed 10/29/2021.  Lung Cancer Screening: (Low Dose CT Chest recommended if Age 72-80ears, 30 pack-year currently smoking OR have quit w/in 15years.) does not qualify.   Lung Cancer Screening Referral: no  Additional Screening:  Hepatitis C Screening: does qualify; Completed 06/11/2019  Vision Screening: Recommended annual ophthalmology exams for early detection of glaucoma and other disorders of the eye. Is the patient up to date with their annual eye exam?  Yes  Who is the provider or what is the name of the office in which the patient attends annual eye exams? [place] is not established with a provider, would they like to be referred to a provider to establish care? No .  Dental Screening: Recommended annual dental exams for proper oral hygiene  Community Resource Referral / Chronic Care Management: CRR required this visit?  No   CCM required this visit?  No      Plan:     I have personally reviewed and noted the following in the patients chart:   Medical and social history Use of alcohol, tobacco or illicit drugs  Current medications and supplements including opioid prescriptions.  Functional ability and status Nutritional status Physical activity Advanced directives List of other physicians Hospitalizations, surgeries, and ER visits in previous 12 months Vitals Screenings to include cognitive, depression, and falls Referrals and appointments  In addition, I have  reviewed and discussed with patient certain preventive protocols, quality metrics, and best practice recommendations. A written personalized care plan for preventive services as well as general preventive health recommendations were provided to patient.     Kellie Simmering, LPN   9/37/1696   Nurse Notes: 6 CIT not administered. Patient has normal cognition per conversation.  Due to this being a virtual visit, the after visit summary with patients personalized plan was offered to patient via mail or my-chart. Patient would like to access on my-chart

## 2021-12-12 ENCOUNTER — Encounter: Payer: Self-pay | Admitting: Pulmonary Disease

## 2021-12-12 DIAGNOSIS — G4733 Obstructive sleep apnea (adult) (pediatric): Secondary | ICD-10-CM

## 2021-12-12 NOTE — Telephone Encounter (Signed)
Send patient a response stating that we can not send orders via email. I have sent the order again to [facility]. The last order was placed back in October 2022.  ?

## 2021-12-18 ENCOUNTER — Telehealth: Payer: Self-pay | Admitting: Pulmonary Disease

## 2021-12-18 NOTE — Telephone Encounter (Signed)
Lm for Melissa with [employer].  ? ? ?

## 2021-12-19 ENCOUNTER — Ambulatory Visit: Payer: Medicare PPO

## 2021-12-19 NOTE — Telephone Encounter (Signed)
ATC melissa. She did not answer. Will attempt to call again.  ? ?Dr. Ander Slade have you seen anything for this patient? ?

## 2022-03-30 NOTE — Telephone Encounter (Signed)
Prolia VOB initiated via [email or web address]  Last Prolia inj 11/09/21 Next Prolia inj due 05/10/22  Prior Auth: APPROVED PA# [id number] Valid: 10/24/21-09/29/22

## 2022-04-13 NOTE — Telephone Encounter (Signed)
Pt ready for scheduling on or after 05/10/22  Out-of-pocket cost due at time of visit: $276  Primary: [employer] Prolia co-insurance: 20% (approximately $276) Admin fee co-insurance: 0%   Secondary: n/a Prolia co-insurance:  Admin fee co-insurance:   Deductible: does not apply  Prior Auth: APPROVED PA# [id number] Valid: 10/24/21-09/29/22  ** This summary of benefits is an estimation of the patient's out-of-pocket cost. Exact cost may vary based on individual plan coverage.

## 2022-05-10 ENCOUNTER — Ambulatory Visit (INDEPENDENT_AMBULATORY_CARE_PROVIDER_SITE_OTHER): Payer: Medicare PPO

## 2022-05-10 DIAGNOSIS — M810 Age-related osteoporosis without current pathological fracture: Secondary | ICD-10-CM | POA: Diagnosis not present

## 2022-05-10 MED ORDER — DENOSUMAB 60 MG/ML ~~LOC~~ SOSY
60.0000 mg | PREFILLED_SYRINGE | Freq: Once | SUBCUTANEOUS | Status: AC
  Administered 2022-05-10: 60 mg via SUBCUTANEOUS

## 2022-05-10 NOTE — Progress Notes (Signed)
After obtaining consent, and per orders of Dr. Gherghe, injection of Prolia 60mg  given by Ameisha Mcclellan L Sharnelle Cappelli in left arm SQ. Patient instructed to remain in clinic for 20 minutes afterwards, and to report any adverse reaction to me immediately.   

## 2022-05-16 ENCOUNTER — Other Ambulatory Visit: Payer: Self-pay | Admitting: Family Medicine

## 2022-05-16 DIAGNOSIS — K219 Gastro-esophageal reflux disease without esophagitis: Secondary | ICD-10-CM

## 2022-05-28 ENCOUNTER — Telehealth: Payer: Self-pay | Admitting: Gastroenterology

## 2022-05-28 NOTE — Telephone Encounter (Signed)
Good Afternoon Dr. [name],    Patient called stating that she wanted to transfer her care over to Dr. Tarri Glenn. Patient stated that her husband is a patient of Dr. Tarri Glenn and is wanting to have the same doctor has her husband. Are you okay with Dr. Tarri Glenn taking on care for patient?   Please advise, thank you.

## 2022-05-28 NOTE — Telephone Encounter (Signed)
That is fine with me if okay with Dr. Tarri Glenn.  I have only seen this patient for colonoscopy in the past, no office visit.

## 2022-05-28 NOTE — Telephone Encounter (Signed)
Dr. Tarri Glenn are you okay with taking on patients care? Please advise.  Thank you.

## 2022-05-30 NOTE — Telephone Encounter (Signed)
Last prolia inj 05/10/22 Next Prolia inj due 11/11/22

## 2022-06-04 DIAGNOSIS — Z1231 Encounter for screening mammogram for malignant neoplasm of breast: Secondary | ICD-10-CM | POA: Diagnosis not present

## 2022-06-05 ENCOUNTER — Encounter: Payer: Self-pay | Admitting: Gastroenterology

## 2022-06-05 NOTE — Telephone Encounter (Signed)
Patient was scheduled for colonoscopy on 10/9 at 11:30 and PV on 9/25 at 11:00.

## 2022-06-06 ENCOUNTER — Encounter: Payer: Self-pay | Admitting: Obstetrics and Gynecology

## 2022-06-19 ENCOUNTER — Other Ambulatory Visit: Payer: Self-pay | Admitting: Obstetrics and Gynecology

## 2022-06-19 NOTE — Telephone Encounter (Signed)
AEX 09/17/2021

## 2022-06-24 ENCOUNTER — Other Ambulatory Visit: Payer: Self-pay | Admitting: Gastroenterology

## 2022-06-24 ENCOUNTER — Ambulatory Visit (AMBULATORY_SURGERY_CENTER): Payer: Self-pay

## 2022-06-24 VITALS — Ht <= 58 in | Wt 121.0 lb

## 2022-06-24 DIAGNOSIS — Z86010 Personal history of colonic polyps: Secondary | ICD-10-CM

## 2022-06-24 MED ORDER — NA SULFATE-K SULFATE-MG SULF 17.5-3.13-1.6 GM/177ML PO SOLN: 1.0000 | Freq: Once | ORAL | 0 refills | Status: AC

## 2022-06-24 NOTE — Progress Notes (Signed)
No egg or soy allergy known to patient   No issues known to pt with past sedation with any surgeries or procedures  Patient denies ever being told they had issues or difficulty with intubation   No FH of Malignant Hyperthermia  Pt is not on diet pills  Pt is not on  home 02   Pt is not on blood thinners   Pt denies issues with constipation but last prep was "adequate" and pt states bms are normally every 2-3 days for her, instructed pt to start miralax 1 capful every day for 5-7 days prior to colonoscopy   No A fib or A flutter  Have any cardiac testing pending--no  Pt instructed to use Single[employer] or GoodRx for a price reduction on prep

## 2022-06-25 ENCOUNTER — Encounter: Payer: Self-pay | Admitting: Gastroenterology

## 2022-07-08 ENCOUNTER — Encounter: Payer: Self-pay | Admitting: Gastroenterology

## 2022-07-08 ENCOUNTER — Ambulatory Visit (AMBULATORY_SURGERY_CENTER): Payer: Medicare PPO | Admitting: Gastroenterology

## 2022-07-08 VITALS — BP 115/54 | HR 92 | Temp 97.8°F | Resp 13 | Ht <= 58 in | Wt 121.0 lb

## 2022-07-08 DIAGNOSIS — Z86010 Personal history of colonic polyps: Secondary | ICD-10-CM

## 2022-07-08 DIAGNOSIS — F419 Anxiety disorder, unspecified: Secondary | ICD-10-CM | POA: Diagnosis not present

## 2022-07-08 DIAGNOSIS — F32A Depression, unspecified: Secondary | ICD-10-CM | POA: Diagnosis not present

## 2022-07-08 DIAGNOSIS — G4730 Sleep apnea, unspecified: Secondary | ICD-10-CM | POA: Diagnosis not present

## 2022-07-08 DIAGNOSIS — Z09 Encounter for follow-up examination after completed treatment for conditions other than malignant neoplasm: Secondary | ICD-10-CM | POA: Diagnosis not present

## 2022-07-08 MED ORDER — SODIUM CHLORIDE 0.9 % IV SOLN: 500.0000 mL | Freq: Once | INTRAVENOUS | Status: DC

## 2022-07-08 NOTE — Progress Notes (Signed)
PT taken to PACU. Monitors in place. VSS. Report given to RN. 

## 2022-07-08 NOTE — Patient Instructions (Addendum)
Handouts given for diverticulosis and high fiber diet.  There were no polyps seen today!  You may need another screening colonoscopy in 5 years, call to schedule an office appointment at that time to verify if it is clinically appropriate.  Please call us at [phone] if you have a change in bowel habits, change in family history of colo-rectal cancer, rectal bleeding or other GI concern before that time.   YOU HAD AN ENDOSCOPIC PROCEDURE TODAY AT [facility]:   Refer to the procedure report that was given to you for any specific questions about what was found during the examination.  If the procedure report does not answer your questions, please call your gastroenterologist to clarify.  If you requested that your care partner not be given the details of your procedure findings, then the procedure report has been included in a sealed envelope for you to review at your convenience later.  YOU SHOULD EXPECT: Some feelings of bloating in the abdomen. Passage of more gas than usual.  Walking can help get rid of the air that was put into your GI tract during the procedure and reduce the bloating. If you had a lower endoscopy (such as a colonoscopy or flexible sigmoidoscopy) you may notice spotting of blood in your stool or on the toilet paper. If you underwent a bowel prep for your procedure, you may not have a normal bowel movement for a few days.  Please Note:  You might notice some irritation and congestion in your nose or some drainage.  This is from the oxygen used during your procedure.  There is no need for concern and it should clear up in a day or so.  SYMPTOMS TO REPORT IMMEDIATELY:  Following lower endoscopy (colonoscopy):  Excessive amounts of blood in the stool  Significant tenderness or worsening of abdominal pains  Swelling of the abdomen that is new, acute  Fever of 100F or higher In addition to the above symptoms, report these symptoms: Persistent  cough. Difficulty breathing, such as wheezing or shortness of breath. Fever. Chest pain. Being more tired than usual (fatigue).  For urgent or emergent issues, a gastroenterologist can be reached at any hour by calling [phone]. Do not use MyChart messaging for urgent concerns.   DIET:  We do recommend a small meal at first, but then you may proceed to your regular diet.  Drink plenty of fluids but you should avoid alcoholic beverages for 24 hours.  ACTIVITY:  You should plan to take it easy for the rest of today and you should NOT DRIVE or use heavy machinery until tomorrow (because of the sedation medicines used during the test).    FOLLOW UP: Our staff will call the number listed on your records the next business day following your procedure.  We will call around 7:15- 8:00 am to check on you and address any questions or concerns that you may have regarding the information given to you following your procedure. If we do not reach you, we will leave a message.      - Patient has a contact number available for emergencies. The signs and symptoms of potential delayed complications were discussed with the patient. Return to normal activities tomorrow. Written discharge instructions were provided to the patient. - High fiber diet. - Continue present medications. - Consider repeat colonoscopy in 5 years for surveillance if it is clinically appropriate at that time. - Emerging evidence supports eating a diet of fruits, vegetables, grains, calcium,  and yogurt while reducing red meat and alcohol may reduce the risk of colon cancer. - Thank you for allowing me to be involved in your colon cancer prevention. Thornton Park   SIGNATURES/CONFIDENTIALITY: You and/or your care partner have signed paperwork which will be entered into your electronic medical record.  These signatures attest to the fact that that the information above on your After Visit Summary has been reviewed and is  understood.  Full responsibility of the confidentiality of this discharge information lies with you and/or your care-partner.

## 2022-07-08 NOTE — Op Note (Signed)
[place] Patient Name: Jessica Casey Procedure Date: 07/08/2022 11:12 AM MRN: [id number] Endoscopist: Thornton Park MD, MD Age: 71 Referring MD:  Date of Birth: 17-Oct-1950 Gender: Female Account #: [id number] Procedure:                Colonoscopy Indications:              High risk colon cancer surveillance: Personal                            history of multiple (3 or more) adenomas                           Tubular adenomas removed on colonoscopies in 2006,                            2013, and 3 removed on colonoscopy in 2018 Medicines:                Monitored Anesthesia Care Procedure:                Pre-Anesthesia Assessment:                           - Prior to the procedure, a History and Physical                            was performed, and patient medications and                            allergies were reviewed. The patient's tolerance of                            previous anesthesia was also reviewed. The risks                            and benefits of the procedure and the sedation                            options and risks were discussed with the patient.                            All questions were answered, and informed consent                            was obtained. Prior Anticoagulants: The patient has                            taken no previous anticoagulant or antiplatelet                            agents. ASA Grade Assessment: II - A patient with                            mild systemic disease. After reviewing the risks  and benefits, the patient was deemed in                            satisfactory condition to undergo the procedure.                           After obtaining informed consent, the colonoscope                            was passed under direct vision. Throughout the                            procedure, the patient's blood pressure, pulse, and                            oxygen saturations were  monitored continuously. The                            0405 PCF-H190TL Slim SB Colonoscope was introduced                            through the anus and advanced to the the cecum,                            identified by appendiceal orifice and ileocecal                            valve. The colonoscopy was technically difficult                            and complex due to significant looping and a                            tortuous colon. Successful completion of the                            procedure was aided by water immersion, changing                            the patient's position, withdrawing the scope and                            replacing with the pediatric colonoscope and                            applying abdominal pressure. The patient tolerated                            the procedure fairly well. The quality of the bowel                            preparation was good. The ileocecal valve,  appendiceal orifice, and rectum were photographed. Scope In: 11:40:41 AM Scope Out: 11:52:00 AM Scope Withdrawal Time: 0 hours 7 minutes 16 seconds  Total Procedure Duration: 0 hours 11 minutes 19 seconds  Findings:                 The perianal and digital rectal examinations were                            normal.                           Non-bleeding internal hemorrhoids were found.                           Many small and large-mouthed diverticula were found                            in the sigmoid colon.                           The left colon was significantly tortuous. I had to                            switch to an ultraslim colonoscope to advance                            through the distal sigmoid.                           The exam was otherwise without abnormality on                            direct and retroflexion views. Complications:            Barotrauma noted in the right colon, vomiting                            occurred when  the first colonoscope was inserted in                            the rectum prior to any advance of the scope, there                            was no witnessed aspiration Estimated Blood Loss:     Estimated blood loss: none. Impression:               - Non-bleeding internal hemorrhoids.                           - Diverticulosis in the sigmoid colon.                           - Tortuous colon.                           - The examination was otherwise normal on direct  and retroflexion views.                           - No specimens collected. Recommendation:           - Patient has a contact number available for                            emergencies. The signs and symptoms of potential                            delayed complications were discussed with the                            patient. Return to normal activities tomorrow.                            Written discharge instructions were provided to the                            patient.                           - High fiber diet.                           - Continue present medications.                           - Consider repeat colonoscopy in 5 years for                            surveillance if it is clinically appropriate at                            that time.                           - Emerging evidence supports eating a diet of                            fruits, vegetables, grains, calcium, and yogurt                            while reducing red meat and alcohol may reduce the                            risk of colon cancer.                           - Thank you for allowing me to be involved in your                            colon cancer prevention. Thornton Park MD, MD 07/08/2022 12:10:05 PM This report has been signed electronically.

## 2022-07-08 NOTE — Progress Notes (Signed)
Referring Provider: [name], [name], MD Primary Care Physician:  [name], [name], MD  Indication for Procedure:  Colon cancer Surveillance   IMPRESSION:  Need for colon cancer surveillance Appropriate candidate for monitored anesthesia care  PLAN: Colonoscopy in [place] today   HPI: Jessica Casey is a 71 y.o. female presents for surveillance colonoscopy.  Prior endoscopic history:  Colonoscopy 2006: polyp and sigmoid diverticulosis. The polyp was a tubular adenoma.   Colonoscopy 2013: 2 polyps and sigmoid diverticulosis. Both polyps were tubular adenomas.   Colonoscopy with Dr. [name] 2018 - The perianal and digital rectal examinations were normal. - A 4 mm polyp was found in the ascending colon. The polyp was sessile. The polyp was removed with a cold snare. Resection and retrieval were complete. - A 5 mm polyp was found in the sigmoid colon. The polyp was sessile. The polyp was removed with a cold snare. Resection and retrieval were complete. - A 4 mm polyp was found in the recto-sigmoid colon. The polyp was sessile. The polyp was removed with a cold snare. Resection and retrieval were complete. - Multiple medium-mouthed diverticula were found in the sigmoid colon. - Internal hemorrhoids were found during retroflexion. - All polyps were tubular adenomas.   No baseline GI symptoms.   No known family history of colon cancer or polyps. No family history of uterine/endometrial cancer, pancreatic cancer or gastric/stomach cancer.   Past Medical History:  Diagnosis Date   Anxiety    Cataract    bilateral removed   Colonic polyp 2005 & 2013   DDD (degenerative disc disease)    Depression    GERD (gastroesophageal reflux disease)    H/O echocardiogram    before 2000, no need for f/u /w cardiac    Heart murmur    MVP- echo- 2010, no symptoms    Memory disorder 09/19/2017   Mononucleosis 1971   Osteopenia after menopause    Sleep apnea     Past Surgical  History:  Procedure Laterality Date   ANTERIOR CERVICAL DECOMP/DISCECTOMY FUSION N/A 05/20/2013   Procedure: ANTERIOR CERVICAL DECOMPRESSION/DISCECTOMY FUSION 2 LEVELS;  Surgeon: Erline Levine, MD;  Location: [name];  Service: Neurosurgery;  Laterality: N/A;  Cervical Seven-Thoracic One, Thoracic One-Two Anterior cervical decompression/Diskectomy/Fusion   CARPAL TUNNEL RELEASE Right    CATARACT EXTRACTION, BILATERAL  10/01/2011   Dr Gershon Crane, [facility] I[facility]   CERVICAL FUSION  2014   3 proceduresr Stern, 2007, 20012 and 2014   COLONOSCOPY  2018   [facility]- TA   colonoscopy with polypectomy  10/01/2011   Dr Deatra Ina   KNEE SURGERY     Dr Wonda Olds ; bursa cystectomy   LUMBAR DISC SURGERY  2010   Dr[facility]   TOTAL HIP ARTHROPLASTY Right 09/04/2021   Procedure: TOTAL HIP ARTHROPLASTY ANTERIOR APPROACH;  Surgeon: Renette Butters, MD;  Location: [facility];  Service: Orthopedics;  Laterality: Right;   [place]   as a baby   WRIST FRACTURE SURGERY Left 02/28/2013   Dr. Kathryne Hitch    Current Outpatient Medications  Medication Sig Dispense Refill   acetaminophen (TYLENOL) 650 MG CR tablet Take 1,300 mg by mouth every 8 (eight) hours as needed for pain.     BIOTIN PO Take 1 tablet by mouth daily.     buPROPion (WELLBUTRIN XL) 300 MG 24 hr tablet TAKE 1 TABLET EVERY DAY 90 tablet 0  Calcium Carb-Cholecalciferol (CALCIUM 600 + D PO) Take 1 tablet by mouth daily.     Cholecalciferol (VITAMIN D) 2000 UNITS CAPS Take 2,000 Units by mouth daily.     Cyanocobalamin (VITAMIN B-12) 1000 MCG/15ML LIQD      denosumab (PROLIA) 60 MG/ML SOSY injection Inject 60 mg into the skin every 6 (six) months. At MD office, last done 03/2022     diclofenac sodium (VOLTAREN) 1 % GEL Apply 2 Casey topically 4 (four) times daily. (Patient taking differently: Apply 2 Casey topically 4 (four) times daily as needed (pain).) 3 Tube 3   FLUoxetine (PROZAC) 10 MG tablet Take 1  tablet (10 mg total) by mouth daily. 90 tablet 3   Melatonin 10 MG TABS Take 10 mg by mouth at bedtime as needed (sleep).     Multiple Vitamin (MULTIVITAMIN WITH MINERALS) TABS tablet Take 1 tablet by mouth daily.     omeprazole (PRILOSEC) 40 MG capsule Take 1 capsule (40 mg total) by mouth daily. 90 capsule 1   No current facility-administered medications for this visit.    Allergies as of 07/08/2022 - Review Complete 06/24/2022  Allergen Reaction Noted   Gabapentin      Family History  Problem Relation Age of Onset   Breast cancer Mother        estorgen receptor positive   Stroke Mother 90       Dec   Diabetes Father    Coronary artery disease Father        S/P CBAG , no MI -- Dec   Depression Father    Diverticulosis Father    Breast cancer Sister        estrogen receptor positive   Depression Sister    Depression Sister    Depression Sister    Depression Sister    Colon polyps Sister    Depression Brother    Bipolar disorder Other        Neice   Colon cancer Neg Hx    Esophageal cancer Neg Hx    Pancreatic cancer Neg Hx    Rectal cancer Neg Hx    Stomach cancer Neg Hx      Physical Exam: General:   Alert,  well-nourished, pleasant and cooperative in NAD Head:  Normocephalic and atraumatic. Eyes:  Sclera clear, no icterus.   Conjunctiva pink. Mouth:  No deformity or lesions.   Neck:  Supple; no masses or thyromegaly. Lungs:  Clear throughout to auscultation.   No wheezes. Heart:  Regular rate and rhythm; no murmurs. Abdomen:  Soft, non-tender, nondistended, normal bowel sounds, no rebound or guarding.  Msk:  Symmetrical. No boney deformities LAD: No inguinal or umbilical LAD Extremities:  No clubbing or edema. Neurologic:  Alert and  oriented x4;  grossly nonfocal Skin:  No obvious rash or bruise. Psych:  Alert and cooperative. Normal mood and affect.     Studies/Results: No results found.    Aimar Borghi L. Tarri Glenn, MD, MPH 07/08/2022, 10:29 AM

## 2022-07-09 ENCOUNTER — Telehealth: Payer: Self-pay

## 2022-07-09 NOTE — Telephone Encounter (Signed)
Attempted to reach pt. With follow-up call following endoscopic procedure 07/08/2022.  LM On pt. Voice mail to call if she has any questions or concerns.

## 2022-08-06 ENCOUNTER — Other Ambulatory Visit: Payer: Self-pay | Admitting: Family Medicine

## 2022-08-06 DIAGNOSIS — K219 Gastro-esophageal reflux disease without esophagitis: Secondary | ICD-10-CM

## 2022-09-01 ENCOUNTER — Other Ambulatory Visit: Payer: Self-pay | Admitting: Obstetrics and Gynecology

## 2022-09-25 ENCOUNTER — Encounter: Payer: Self-pay | Admitting: Obstetrics and Gynecology

## 2022-09-25 NOTE — Telephone Encounter (Signed)
Ok to send temporary refill and advise pt to address dose increase w/ BS at annual exam? Please advise.

## 2022-09-27 ENCOUNTER — Other Ambulatory Visit: Payer: Self-pay | Admitting: Obstetrics and Gynecology

## 2022-09-27 MED ORDER — FLUOXETINE HCL 20 MG PO TABS: 20.0000 mg | ORAL_TABLET | Freq: Every day | ORAL | 0 refills | Status: DC

## 2022-09-27 NOTE — Progress Notes (Signed)
New Rx for Prozac 20 mg daily.  #30, RF none.   Has office visit in January.

## 2022-10-02 NOTE — Progress Notes (Signed)
72 y.o. G69P2002 Married Caucasian female here for annual exam.    Increased dose of Prozac to 20 mg a couple of weeks ago. Also takes Wellbutrin XL 300 mg.  Lost 10 pounds intentionally.   Continuing to care for her grandson.  PCP:   Betty [name], MD  Patient's last menstrual period was 10/01/1995 (approximate).           Sexually active: No.  The current method of family planning is post menopausal status.    Exercising: Yes.     Walking and resistance bands. Smoker:  no  Health Maintenance: Pap:  09/17/21 negative: HR HPV negative, 08/28/20 negative History of abnormal Pap:  no MMG:  06/06/22 Breast Density Category B, BI-RADS CATEGORY 1 Negative  Colonoscopy:  07/08/22 BMD:   10/29/21  Result  osteopenia. On Prolia through endocrinology. TDaP:  06/14/19 Gardasil:   no Screening Labs: PCP.   reports that she has never smoked. She has never used smokeless tobacco. She reports that she does not drink alcohol and does not use drugs.  Past Medical History:  Diagnosis Date   Anxiety    Cataract    bilateral removed   Colonic polyp 2005 & 2013   DDD (degenerative disc disease)    Depression    GERD (gastroesophageal reflux disease)    H/O echocardiogram    before 2000, no need for f/u /w cardiac    Heart murmur    MVP- echo- 2010, no symptoms    Memory disorder 09/19/2017   Mononucleosis 1971   Osteopenia after menopause    Sleep apnea     Past Surgical History:  Procedure Laterality Date   ANTERIOR CERVICAL DECOMP/DISCECTOMY FUSION N/A 05/20/2013   Procedure: ANTERIOR CERVICAL DECOMPRESSION/DISCECTOMY FUSION 2 LEVELS;  Surgeon: Erline Levine, MD;  Location: [place] [facility];  Service: Neurosurgery;  Laterality: N/A;  Cervical Seven-Thoracic One, Thoracic One-Two Anterior cervical decompression/Diskectomy/Fusion   CARPAL TUNNEL RELEASE Right    CATARACT EXTRACTION, BILATERAL  10/01/2011   Dr Gershon Crane, [facility] IOL   CERVICAL FUSION  2014   3 proceduresr Stern, 2007, 20012 and  2014   COLONOSCOPY  2018   [facility]- TA   colonoscopy with polypectomy  10/01/2011   Dr Deatra Ina   KNEE SURGERY     Dr Wonda Olds ; bursa cystectomy   LUMBAR DISC SURGERY  2010   Dr.Stern and [place]   TOTAL HIP ARTHROPLASTY Right 09/04/2021   Procedure: TOTAL HIP ARTHROPLASTY ANTERIOR APPROACH;  Surgeon: Renette Butters, MD;  Location: [facility];  Service: Orthopedics;  Laterality: Right;   [place]   as a baby   WRIST FRACTURE SURGERY Left 02/28/2013   Dr. Kathryne Hitch    Current Outpatient Medications  Medication Sig Dispense Refill   acetaminophen (TYLENOL) 650 MG CR tablet Take 1,300 mg by mouth every 8 (eight) hours as needed for pain.     BIOTIN PO Take 1 tablet by mouth daily.     buPROPion (WELLBUTRIN XL) 300 MG 24 hr tablet TAKE 1 TABLET EVERY DAY 90 tablet 0   Calcium Carb-Cholecalciferol (CALCIUM 600 + D PO) Take 1 tablet by mouth daily.     Cholecalciferol (VITAMIN D) 2000 UNITS CAPS Take 2,000 Units by mouth daily.     Cyanocobalamin (VITAMIN B-12) 1000 MCG/15ML LIQD      denosumab (PROLIA) 60 MG/ML SOSY injection Inject 60 mg into the skin every 6 (six) months. At MD  office, last done 03/2022     diclofenac sodium (VOLTAREN) 1 % GEL Apply 2 g topically 4 (four) times daily. (Patient taking differently: Apply 2 g topically 4 (four) times daily as needed (pain).) 3 Tube 3   FLUoxetine (PROZAC) 20 MG tablet Take 1 tablet (20 mg total) by mouth daily. 30 tablet 0   Multiple Vitamin (MULTIVITAMIN WITH MINERALS) TABS tablet Take 1 tablet by mouth daily.     omeprazole (PRILOSEC) 40 MG capsule TAKE 1 CAPSULE (40 MG TOTAL) BY MOUTH DAILY. 90 capsule 0   No current facility-administered medications for this visit.    Family History  Problem Relation Age of Onset   Breast cancer Mother        estorgen receptor positive   Stroke Mother 90       Dec   Diabetes Father    Coronary artery disease Father        S/P CBAG , no MI --  Dec   Depression Father    Diverticulosis Father    Breast cancer Sister        estrogen receptor positive   Depression Sister    Depression Sister    Depression Sister    Depression Sister    Colon polyps Sister    Depression Brother    Colon cancer Maternal Grandfather    Bipolar disorder Other        Neice   Esophageal cancer Neg Hx    Pancreatic cancer Neg Hx    Rectal cancer Neg Hx    Stomach cancer Neg Hx     Review of Systems  All other systems reviewed and are negative.   Exam:   BP 122/78 (BP Location: Right Arm, Patient Position: Sitting, Cuff Size: Normal)   Pulse 83   Ht '4\' 9"'$  (1.448 m)   Wt 118 lb (53.5 kg)   LMP 10/01/1995 (Approximate)   SpO2 99%   BMI 25.53 kg/m     General appearance: alert, cooperative and appears stated age Head: normocephalic, without obvious abnormality, atraumatic Neck: no adenopathy, supple, symmetrical, trachea midline and thyroid normal to inspection and palpation Lungs: clear to auscultation bilaterally Breasts: normal appearance, no masses or tenderness, No nipple retraction or dimpling, No nipple discharge or bleeding, No axillary adenopathy Heart: regular rate and rhythm Abdomen: soft, non-tender; no masses, no organomegaly Extremities: extremities normal, atraumatic, no cyanosis or edema Skin: skin color, texture, turgor normal. No rashes or lesions Lymph nodes: cervical, supraclavicular, and axillary nodes normal. Neurologic: grossly normal  Pelvic: External genitalia:  no lesions              No abnormal inguinal nodes palpated.              Urethra:  normal appearing urethra with no masses, tenderness or lesions              Bartholins and Skenes: normal                 Vagina: normal appearing vagina with normal color and discharge, no lesions              Cervix: no lesions              Pap taken:  no Bimanual Exam:  Uterus:  normal size, contour, position, consistency, mobility, non-tender.  Pea size mass to the  left of the cervix in the lower uterine segment.              Adnexa:  no mass, fullness, tenderness              Rectal exam: yes.  Confirms.              Anus:  normal sphincter tone, no lesions  Chaperone was present for exam:  Raquel Sarna  Assessment:   Well woman exam with gynecologic examination.  Hx recurrent UTI.  Osteopenia.  Fosamax use for 8 years.  Hx fracture of wrist.  Now on Prolia. Hx anxiety and depression.  Medication monitoring encounter.  FH breast cancer in mother and sister.  Uterine enlargement, change.  May be a fibroid, nabothian cyst.  Plan: Mammogram screening discussed. Self breast awareness reviewed. Pap and HR HPV 2027 Guidelines for Calcium, Vitamin D, regular exercise program including cardiovascular and weight bearing exercise. Refill of Prozac 20 mg daily and Wellbutrin XL 300 mg daily.  Patient will let me know how she is doing on the increased dose of Prozac 20 mg daily.  Return for pelvic US.  Follow up annually and prn.   After summary provided.

## 2022-10-08 NOTE — Telephone Encounter (Signed)
Prolia VOB initiated via [email or web address]  Last OV: 10/05/21 Next OV:  Last prolia inj 05/10/22 Next Prolia inj due 11/11/22

## 2022-10-11 ENCOUNTER — Encounter: Payer: Self-pay | Admitting: Internal Medicine

## 2022-10-11 ENCOUNTER — Ambulatory Visit: Payer: Medicare PPO | Admitting: Internal Medicine

## 2022-10-11 VITALS — BP 126/70 | HR 93 | Ht <= 58 in | Wt 118.2 lb

## 2022-10-11 DIAGNOSIS — E559 Vitamin D deficiency, unspecified: Secondary | ICD-10-CM | POA: Diagnosis not present

## 2022-10-11 DIAGNOSIS — M810 Age-related osteoporosis without current pathological fracture: Secondary | ICD-10-CM | POA: Diagnosis not present

## 2022-10-11 LAB — BASIC METABOLIC PANEL
BUN: 23 mg/dL (ref 6–23)
CO2: 28 mEq/L (ref 19–32)
Calcium: 9.6 mg/dL (ref 8.4–10.5)
Chloride: 104 mEq/L (ref 96–112)
Creatinine, Ser: 1.01 mg/dL (ref 0.40–1.20)
GFR: 55.6 mL/min — ABNORMAL LOW (ref >60.00)
Glucose, Bld: 94 mg/dL (ref 70–99)
Potassium: 4.1 mEq/L (ref 3.5–5.1)
Sodium: 140 mEq/L (ref 135–145)

## 2022-10-11 LAB — VITAMIN D 25 HYDROXY (VIT D DEFICIENCY, FRACTURES): VITD: 67.99 ng/mL (ref 30.00–100.00)

## 2022-10-11 NOTE — Patient Instructions (Signed)
Please stop at the lab.  Let's restart Prolia for now.  Please come back for a follow-up appointment in 1 year.

## 2022-10-11 NOTE — Progress Notes (Signed)
Patient ID: Jessica Casey, female   DOB: [birth year], 72 y.o.   MRN: [id number]   HPI  Jessica Casey is a 72 y.o.-year-old female, initially referred by her OB/GYN doctor, Dr. Judeth Horn, returning for follow-up for osteopenia (Op) on DXA scans, but with clinical osteoporosis.  Last visit 1 year ago.  Interim history: No falls or fractures since last visit. No dizziness/vertigo/orthostasis/poor vision.   Reviewed history: She was diagnosed with osteoporosis in 2012, then osteopenia in 2016.  However, due to history of fracture, she has clinical osteoporosis.  Reviewed previous DXA scan reports: 10/19/2021 ([facility]) L1-L4 T score FN T score 33% distal Radius FRAX score  -Full report not available  n/a RFN: ? LFN: -2.0 -0.9  10-year major osteoporotic fracture: 12% Hip fracture: 2.4%   Date L1-L4 T score FN T score 33% distal Radius  08/23/2019 ([facility]) n/a RFN: -1.9 LFN: -2.0 -1.4  08/12/2017 ([facility]) n/a RFN: -1.6 LFN: -1.6  -1.6  08/07/2015 ([facility]) n/a RFN: -1.9 LFN: -1.8  -0.7  05/07/2011 ([facility]) n/a RFN: -2.3 LFN: -2.5  -0.9   She has a history of: - wrist in 2015 (lost balance on concrete)  Osteoporosis treatments:  - Fosamax 70 mg weekly  - started 2012 - Prolia-started 03/07/2020 but she did not receive further doses; restarted after our visit from 09/2021:  11/09/2021 05/10/2022  She tolerates Prolia well, without jaw/hip/thigh pain.  She has a history of vitamin D deficiency: Lab Results  Component Value Date   VD25OH 56.5 10/05/2021   VD25OH 76.41 10/03/2020   VD25OH 92.12 09/13/2019   VD25OH 79.96 02/10/2019   VD25OH 61 05/16/2014   VD25OH 28 (L) 11/01/2010   VD25OH 30 08/02/2009   She is on calcium and vitamin D: Vitamin D 2000 units Qod now (decreased at last OV) + multivitamin + calcium-vitamin D.  She was walking for exercise 1.65 miles 3-4 times a week.  Last visit she was less active after her R hip replacement surgery in 08/2021.  She picked up walking again but  not as much as before.  She would like to increase this.  She also does some weightbearing exercises, using bands.  She has a h/o 3 back surgeries - cervical area and also spinal fusion.  She continues to have back pain.  She does not take high vitamin A doses.  Menopause was at 72 years old.  She was on HRT for 2 years. However, BrCA in sister and mother (Estrogen R +) >> she was taken off HRT.  FH of osteoporosis: sister - Op.  No history of kidney stones, persistent hyper or hypocalcemia: Lab Results  Component Value Date   CALCIUM 9.6 10/05/2021   CALCIUM 9.1 08/29/2021   CALCIUM 9.4 07/30/2021   CALCIUM 9.0 10/03/2020   CALCIUM 9.2 09/13/2019   CALCIUM 9.3 02/10/2019   CALCIUM 9.4 08/06/2016   CALCIUM 9.4 02/28/2016   CALCIUM 9.6 08/08/2015   CALCIUM 9.0 05/16/2014   No history of thyrotoxicosis: Lab Results  Component Value Date   TSH 4.070 10/05/2021   TSH 2.04 08/06/2016   TSH 1.28 08/08/2015   TSH 2.109 05/16/2014   TSH 1.41 02/17/2013   No history of CKD, but latest BUN/creatinine: Lab Results  Component Value Date   BUN 20 10/05/2021   CREATININE 0.99 10/05/2021   She started B12 vitamin since last visit.  ROS: + See HPI  I reviewed pt's medications, allergies, PMH, social hx, family hx, and changes were documented in the history of  present illness. Otherwise, unchanged from my initial visit note.  Past Medical History:  Diagnosis Date   Anxiety    Cataract    bilateral removed   Colonic polyp 2005 & 2013   DDD (degenerative disc disease)    Depression    GERD (gastroesophageal reflux disease)    H/O echocardiogram    before 2000, no need for f/u /w cardiac    Heart murmur    MVP- echo- 2010, no symptoms    Memory disorder 09/19/2017   Mononucleosis 1971   Osteopenia after menopause    Sleep apnea    Past Surgical History:  Procedure Laterality Date   ANTERIOR CERVICAL DECOMP/DISCECTOMY FUSION N/A 05/20/2013   Procedure: ANTERIOR CERVICAL  DECOMPRESSION/DISCECTOMY FUSION 2 LEVELS;  Surgeon: Erline Levine, MD;  Location: [place] [facility];  Service: Neurosurgery;  Laterality: N/A;  Cervical Seven-Thoracic One, Thoracic One-Two Anterior cervical decompression/Diskectomy/Fusion   CARPAL TUNNEL RELEASE Right    CATARACT EXTRACTION, BILATERAL  10/01/2011   Dr Gershon Crane, [facility] IOL   CERVICAL FUSION  2014   3 proceduresr Stern, 2007, 20012 and 2014   COLONOSCOPY  2018   [facility]- TA   colonoscopy with polypectomy  10/01/2011   Dr Deatra Ina   KNEE SURGERY     Dr Wonda Olds ; bursa cystectomy   LUMBAR DISC SURGERY  2010   Dr.Stern and [place]   TOTAL HIP ARTHROPLASTY Right 09/04/2021   Procedure: TOTAL HIP ARTHROPLASTY ANTERIOR APPROACH;  Surgeon: Renette Butters, MD;  Location: [facility];  Service: Orthopedics;  Laterality: Right;   [place]   as a baby   WRIST FRACTURE SURGERY Left 02/28/2013   Dr. Kathryne Hitch   Social History   Socioeconomic History   Marital status: Married    Spouse name: Not on file   Number of children: 2   Years of education: 16+   Highest education level: Not on file  Occupational History   Occupation: Music therapist- Retired  Tobacco Use   Smoking status: Never Smoker   Smokeless tobacco: Never Used  Substance and Sexual Activity   Alcohol use: No    Alcohol/week: 0.0 standard drinks   Drug use: No   Sexual activity: Not Currently    Partners: Male    Birth control/protection: Post-menopausal  Other Topics Concern   Not on file  Social History Narrative   Lives   Caffeine use:    Right handed    Social Determinants of Health   Financial Resource Strain:    Difficulty of Paying Living Expenses: Not on file  Food Insecurity:    Worried About [place] in the Last Year: Not on file   [employer] of Food in the Last Year: Not on file  Transportation Needs:    Lack of Transportation (Medical): Not on file   Lack of Transportation (Non-Medical):  Not on file  Physical Activity:    Days of Exercise per Week: Not on file   Minutes of Exercise per Session: Not on file  Stress:    Feeling of Stress : Not on file  Social Connections:    Frequency of Communication with Friends and Family: Not on file   Frequency of Social Gatherings with Friends and Family: Not on file   Attends Religious Services: Not on file   Active Member of Clubs or Organizations: Not on file   Attends Archivist Meetings: Not on file  Marital Status: Not on file  Intimate Partner Violence:    Fear of Current or Ex-Partner: Not on file   Emotionally Abused: Not on file   Physically Abused: Not on file   Sexually Abused: Not on file   Current Outpatient Medications on File Prior to Visit  Medication Sig Dispense Refill   acetaminophen (TYLENOL) 650 MG CR tablet Take 1,300 mg by mouth every 8 (eight) hours as needed for pain.     BIOTIN PO Take 1 tablet by mouth daily.     buPROPion (WELLBUTRIN XL) 300 MG 24 hr tablet TAKE 1 TABLET EVERY DAY 90 tablet 0   Calcium Carb-Cholecalciferol (CALCIUM 600 + D PO) Take 1 tablet by mouth daily.     Cholecalciferol (VITAMIN D) 2000 UNITS CAPS Take 2,000 Units by mouth daily.     Cyanocobalamin (VITAMIN B-12) 1000 MCG/15ML LIQD      denosumab (PROLIA) 60 MG/ML SOSY injection Inject 60 mg into the skin every 6 (six) months. At MD office, last done 03/2022     diclofenac sodium (VOLTAREN) 1 % GEL Apply 2 g topically 4 (four) times daily. (Patient taking differently: Apply 2 g topically 4 (four) times daily as needed (pain).) 3 Tube 3   FLUoxetine (PROZAC) 20 MG tablet Take 1 tablet (20 mg total) by mouth daily. 30 tablet 0   Multiple Vitamin (MULTIVITAMIN WITH MINERALS) TABS tablet Take 1 tablet by mouth daily.     omeprazole (PRILOSEC) 40 MG capsule TAKE 1 CAPSULE (40 MG TOTAL) BY MOUTH DAILY. 90 capsule 0   No current facility-administered medications on file prior to visit.   Allergies  Allergen Reactions    Gabapentin     Tongue swelling and itching    Family History  Problem Relation Age of Onset   Breast cancer Mother        estorgen receptor positive   Stroke Mother 90       Dec   Diabetes Father    Coronary artery disease Father        S/P CBAG , no MI -- Dec   Depression Father    Diverticulosis Father    Breast cancer Sister        estrogen receptor positive   Depression Sister    Depression Sister    Depression Sister    Depression Sister    Colon polyps Sister    Depression Brother    Colon cancer Maternal Grandfather    Bipolar disorder Other        Neice   Esophageal cancer Neg Hx    Pancreatic cancer Neg Hx    Rectal cancer Neg Hx    Stomach cancer Neg Hx    PE: BP 126/70 (BP Location: Right Arm, Patient Position: Sitting, Cuff Size: Normal)   Pulse 93   Ht '4\' 10"'$  (1.473 m)   Wt 118 lb 3.2 oz (53.6 kg)   LMP 10/01/1995 (Approximate)   SpO2 99%   BMI 24.70 kg/m  Wt Readings from Last 3 Encounters:  10/11/22 118 lb 3.2 oz (53.6 kg)  07/08/22 121 lb (54.9 kg)  06/24/22 121 lb (54.9 kg)   Constitutional: normal weight, in NAD Eyes:  EOMI, no exophthalmos ENT: no neck masses, no cervical lymphadenopathy Cardiovascular: No tachycardia at the time of the exam, RR, No MRG Respiratory: CTA B Musculoskeletal: no deformities Skin:no rashes Neurological: no tremor with outstretched hands  Assessment: 1. Clinical Osteoporosis  2.  Vitamin D deficiency  Plan: 1. Osteoporosis -Likely  postmenopausal as she had early menopause, also, she has family history of osteopenia -Before starting Prolia, femoral neck T-scores were worse.  She also had a fragility fracture in 2015.  Therefore, overall, she can be considered to have gynecological porosis and at risk for further fractures.   -After her first Prolia injection in 02/2020, she was off the medication until our last visit.  We decided to restart the medication at that time -she had 2 injections since.  She  tolerates them well, with hip/thigh/jaw pain. -We reviewed together the latest bone scan from 09/2021.  I do not have the full report, the physician interpretation, but the T-score appears to be better at the level of the 33% distal radius and also left total hip.  It was stable at the left femoral neck. -We discussed about continuing with Prolia for now -She is aware about fall precautions -She is getting get 1200 mg of calcium daily from supplements and diet -I again recommended weightbearing exercises -she is doing some walking and exercises with bands and plans to increase this. -Will be due for another bone density scan after our next visit -I will need the full report at that time -At today's visit we will recheck her calcium, kidney function, and vitamin D -I will see her back in a year  2.  Vitamin D deficiency -She is on supplementation, currently with ~3000 units daily -Latest vitamin D level was normal at last visit -We will recheck this today   Component     Latest Ref Rng 10/11/2022  Sodium     135 - 145 mEq/L 140   Potassium     3.5 - 5.1 mEq/L 4.1   Chloride     96 - 112 mEq/L 104   CO2     19 - 32 mEq/L 28   Glucose     70 - 99 mg/dL 94   BUN     6 - 23 mg/dL 23   Creatinine     0.40 - 1.20 mg/dL 1.01   Calcium     8.4 - 10.5 mg/dL 9.6   GFR     >60.00 mL/min 55.60 (L)   VITD     30.00 - 100.00 ng/mL 67.99   Labs are normal with the exception of a slightly low GFR, which is not new.  Philemon Kingdom, MD PhD [facility]

## 2022-10-14 ENCOUNTER — Ambulatory Visit (INDEPENDENT_AMBULATORY_CARE_PROVIDER_SITE_OTHER): Payer: Medicare PPO | Admitting: Obstetrics and Gynecology

## 2022-10-14 ENCOUNTER — Encounter: Payer: Self-pay | Admitting: Obstetrics and Gynecology

## 2022-10-14 VITALS — BP 122/78 | HR 83 | Ht <= 58 in | Wt 118.0 lb

## 2022-10-14 DIAGNOSIS — Z01419 Encounter for gynecological examination (general) (routine) without abnormal findings: Secondary | ICD-10-CM

## 2022-10-14 DIAGNOSIS — F419 Anxiety disorder, unspecified: Secondary | ICD-10-CM

## 2022-10-14 DIAGNOSIS — N852 Hypertrophy of uterus: Secondary | ICD-10-CM | POA: Diagnosis not present

## 2022-10-14 DIAGNOSIS — Z5181 Encounter for therapeutic drug level monitoring: Secondary | ICD-10-CM | POA: Diagnosis not present

## 2022-10-14 DIAGNOSIS — F32A Depression, unspecified: Secondary | ICD-10-CM | POA: Diagnosis not present

## 2022-10-14 MED ORDER — FLUOXETINE HCL 20 MG PO TABS: 20.0000 mg | ORAL_TABLET | Freq: Every day | ORAL | 3 refills | Status: DC

## 2022-10-14 MED ORDER — BUPROPION HCL ER (XL) 300 MG PO TB24: 300.0000 mg | ORAL_TABLET | Freq: Every day | ORAL | 3 refills | Status: DC

## 2022-10-14 NOTE — Patient Instructions (Signed)

## 2022-10-23 ENCOUNTER — Other Ambulatory Visit: Payer: Self-pay | Admitting: Obstetrics and Gynecology

## 2022-10-23 NOTE — Telephone Encounter (Signed)
Prior Authorization initiated for PROLIA via [email or web address] KEY: [id number]

## 2022-10-28 NOTE — Telephone Encounter (Signed)
PA# [id number], KEY: [id number] Valid: 10/24/21-09/30/23

## 2022-10-28 NOTE — Telephone Encounter (Signed)
Pt ready for scheduling on or after 11/11/22  Out-of-pocket cost due at time of visit: $0  Primary: [employer] Medicare Adv PPO Prolia co-insurance: 0% Admin fee co-insurance: 0%  Secondary: n/a Prolia co-insurance:  Admin fee co-insurance:   Deductible: does not apply  Prior Auth: APPROVED PA# [id number], KEY: [id number] Valid: 10/24/21-09/30/23    ** This summary of benefits is an estimation of the patient's out-of-pocket cost. Exact cost may very based on individual plan coverage.

## 2022-10-29 ENCOUNTER — Encounter: Payer: Self-pay | Admitting: Internal Medicine

## 2022-10-29 DIAGNOSIS — G4733 Obstructive sleep apnea (adult) (pediatric): Secondary | ICD-10-CM | POA: Diagnosis not present

## 2022-11-04 DIAGNOSIS — M25561 Pain in right knee: Secondary | ICD-10-CM | POA: Diagnosis not present

## 2022-11-05 ENCOUNTER — Encounter: Payer: Self-pay | Admitting: Family Medicine

## 2022-11-07 ENCOUNTER — Other Ambulatory Visit: Payer: Self-pay | Admitting: Family Medicine

## 2022-11-07 DIAGNOSIS — K219 Gastro-esophageal reflux disease without esophagitis: Secondary | ICD-10-CM

## 2022-11-11 ENCOUNTER — Ambulatory Visit (INDEPENDENT_AMBULATORY_CARE_PROVIDER_SITE_OTHER): Payer: Medicare PPO | Admitting: Family Medicine

## 2022-11-11 ENCOUNTER — Encounter: Payer: Self-pay | Admitting: Internal Medicine

## 2022-11-11 ENCOUNTER — Encounter: Payer: Self-pay | Admitting: Family Medicine

## 2022-11-11 VITALS — BP 126/70 | HR 91 | Temp 98.4°F | Resp 16 | Ht <= 58 in | Wt 118.5 lb

## 2022-11-11 DIAGNOSIS — M810 Age-related osteoporosis without current pathological fracture: Secondary | ICD-10-CM | POA: Diagnosis not present

## 2022-11-11 DIAGNOSIS — M879 Osteonecrosis, unspecified: Secondary | ICD-10-CM

## 2022-11-11 DIAGNOSIS — L609 Nail disorder, unspecified: Secondary | ICD-10-CM

## 2022-11-11 DIAGNOSIS — N1831 Chronic kidney disease, stage 3a: Secondary | ICD-10-CM

## 2022-11-11 DIAGNOSIS — F325 Major depressive disorder, single episode, in full remission: Secondary | ICD-10-CM | POA: Diagnosis not present

## 2022-11-11 DIAGNOSIS — R6884 Jaw pain: Secondary | ICD-10-CM

## 2022-11-11 MED ORDER — HYDROCODONE-ACETAMINOPHEN 5-325 MG PO TABS: 1.0000 | ORAL_TABLET | Freq: Two times a day (BID) | ORAL | 0 refills | Status: DC | PRN

## 2022-11-11 MED ORDER — PREGABALIN 25 MG PO CAPS: 25.0000 mg | ORAL_CAPSULE | Freq: Two times a day (BID) | ORAL | 1 refills | Status: DC

## 2022-11-11 NOTE — Assessment & Plan Note (Signed)
She is reporting that a piece of tooth was left after dental procedure and closed to a nerve, which could be contributing to her pain along with mandibular osteonecrosis.  According to patient, she is awaiting for appointment with neurologist. In the past she has tried Lyrica for lumbar radicular pain and has been well-tolerated. Today we started Lyrica 25 mg, which she will titrate up from once daily at bedtime to 3 times daily, increasing dose every 2 weeks as tolerated. We discussed some side effects. For pain management recommend hydrocodone-acetaminophen 5-325 mg twice daily as needed, she can also take Tylenol 500 mg up to 2 times per day. Follow-up in 6 weeks, when we will consider signing a medication contract.

## 2022-11-11 NOTE — Assessment & Plan Note (Signed)
Problem is stable. Currently she is on Wellbutrin XL 300 mg and fluoxetine 20 mg daily. Problem has been managed by her gynecologist.

## 2022-11-11 NOTE — Assessment & Plan Note (Signed)
Right index, it seems a lesion in between fingernail and bed nail.  Recommend soaking finger in Epsom salt with warm water daily for a few days. Monitor for new symptoms. Will consider dermatology evaluation of not resolved or gets worse.

## 2022-11-11 NOTE — Assessment & Plan Note (Signed)
Problem has been stable, on 10/11/22 Cr 1.01 and e GFR 55. Continue adequate hydration, low-salt diet, and avoidance of NSAIDs.

## 2022-11-11 NOTE — Assessment & Plan Note (Signed)
She is currently on Prolia every 6 months, she is due tomorrow but she is holding on medication until she hear from endocrinologist due to recent Dx of mandibular osteonecrosis. Continue adequate calcium and vitamin D supplementation.

## 2022-11-11 NOTE — Assessment & Plan Note (Signed)
We discussed diagnosis and possible etiologies. She is holding on Prolia for now until she sees or hears from endocrinologist. Following with dentist, oral surgeon, and periodontist.

## 2022-11-11 NOTE — Progress Notes (Signed)
ACUTE VISIT Chief Complaint  Patient presents with   Jaw Pain    Had tooth extraction on 07/05/22; still having pain. Has seen dentist, oral surgeon, and periodontist.   finger swelling    Swelling around nail bed, red.    HPI: Ms.Azjah LADENA PENA is a 72 y.o. female with PMHx significant for OSA, OA,GERD, CKD 3, depression, and anemia  here today complaining of persistent right-sided jaw pain following a tooth extraction on October 6th/2023. The pain is described as a constant ache, ranging from 4-5/10 with Tylenol to 10/10 at its worst. She is frustrated with the ongoing discomfort. The pain is exacerbated by tapping on the tooth or eating. No tingling or numbness is reported, but she notes some swelling in the gum around procedure area.  For this problem she has been evaluated by her dentist, oral surgeon, and periodontist.  Reports that she has been diagnosed with mandibular osteonecrosis.  She also had a root canal and a piece of the tube remains but cannot be removed because it is too close to the nerve.  States that she has been referred to neuro and awaiting an appointment. She has tried prednisone for this problem but it was ineffective. Her sleep has been affected by the pain.  Negative for sore throat, dysphonia, stridor, or oral lesions.  Osteoporosis on Prolia every 6 months, she has taking 2 doses so far.  She has an appointment with endocrinologist to continue treatment, she is to for her next dose tomorrow but waiting for endocrinology's recommendation. Previously she took Fosamax for 7 years.  Depression: Currently she is on Wellbutrin XL 300 mg daily and fluoxetine 20 mg daily, she is no longer following with psychiatrist, medication has been prescribed by her gynecologist.  CKD III: She has not noted gross hematuria, decreased urine output, or foamy urine. No history of hypertension or diabetes. Lab Results  Component Value Date   CREATININE 1.01 10/11/2022   BUN 23  10/11/2022   NA 140 10/11/2022   K 4.1 10/11/2022   CL 104 10/11/2022   CO2 28 10/11/2022   Additionally, she reports a growing pink spot under her right index fingernail that has been present for a couple of months and has growth. It is mildly painful, no hx of trauma. She has not tried OTC treatments.  Review of Systems  Constitutional:  Positive for fatigue. Negative for activity change, appetite change and fever.  Respiratory:  Negative for cough, shortness of breath and wheezing.   Cardiovascular:  Negative for chest pain, palpitations and leg swelling.  Gastrointestinal:  Negative for abdominal pain, nausea and vomiting.  Genitourinary:  Negative for decreased urine volume and hematuria.  Musculoskeletal:  Positive for arthralgias. Negative for gait problem.  Skin:  Negative for rash.  Neurological:  Negative for syncope, weakness, numbness and headaches.  See other pertinent positives and negatives in HPI.  Current Outpatient Medications on File Prior to Visit  Medication Sig Dispense Refill   acetaminophen (TYLENOL) 650 MG CR tablet Take 1,300 mg by mouth every 8 (eight) hours as needed for pain.     BIOTIN PO Take 1 tablet by mouth daily.     buPROPion (WELLBUTRIN XL) 300 MG 24 hr tablet Take 1 tablet (300 mg total) by mouth daily. 90 tablet 3   Calcium Carb-Cholecalciferol (CALCIUM 600 + D PO) Take 1 tablet by mouth daily.     Cholecalciferol (VITAMIN D) 2000 UNITS CAPS Take 2,000 Units by mouth daily.  Cyanocobalamin (VITAMIN B-12) 1000 MCG/15ML LIQD      denosumab (PROLIA) 60 MG/ML SOSY injection Inject 60 mg into the skin every 6 (six) months. At MD office, last done 03/2022     diclofenac sodium (VOLTAREN) 1 % GEL Apply 2 g topically 4 (four) times daily. (Patient taking differently: Apply 2 g topically 4 (four) times daily as needed (pain).) 3 Tube 3   FLUoxetine (PROZAC) 20 MG tablet Take 1 tablet (20 mg total) by mouth daily. 90 tablet 3   Multiple Vitamin  (MULTIVITAMIN WITH MINERALS) TABS tablet Take 1 tablet by mouth daily.     omeprazole (PRILOSEC) 40 MG capsule TAKE 1 CAPSULE (40 MG TOTAL) BY MOUTH DAILY. 30 capsule 0   No current facility-administered medications on file prior to visit.   Past Medical History:  Diagnosis Date   Anxiety    Cataract    bilateral removed   Colonic polyp 2005 & 2013   DDD (degenerative disc disease)    Depression    GERD (gastroesophageal reflux disease)    H/O echocardiogram    before 2000, no need for f/u /w cardiac    Heart murmur    MVP- echo- 2010, no symptoms    Memory disorder 09/19/2017   Mononucleosis 1971   Osteopenia after menopause    Sleep apnea    Allergies  Allergen Reactions   Gabapentin     Tongue swelling and itching     Social History   Socioeconomic History   Marital status: Married    Spouse name: Not on file   Number of children: Not on file   Years of education: 16+   Highest education level: Master's degree (e.g., MA, MS, MEng, MEd, MSW, MBA)  Occupational History   Occupation: Pharmacist, hospital- Retired  Tobacco Use   Smoking status: Never   Smokeless tobacco: Never  Vaping Use   Vaping Use: Never used  Substance and Sexual Activity   Alcohol use: Never   Drug use: Never   Sexual activity: Not Currently    Partners: Male    Birth control/protection: Post-menopausal  Other Topics Concern   Not on file  Social History Narrative   Lives   Caffeine use:    Right handed    Social Determinants of Health   Financial Resource Strain: Low Risk  (11/10/2022)   Overall Financial Resource Strain (CARDIA)    Difficulty of Paying Living Expenses: Not hard at all  Food Insecurity: No Food Insecurity (11/10/2022)   Hunger Vital Sign    Worried About Running Out of Food in the Last Year: Never true    Ran Out of Food in the Last Year: Never true  Transportation Needs: No Transportation Needs (11/10/2022)   PRAPARE - Hydrologist (Medical): No     Lack of Transportation (Non-Medical): No  Physical Activity: Insufficiently Active (11/10/2022)   Exercise Vital Sign    Days of Exercise per Week: 3 days    Minutes of Exercise per Session: 30 min  Stress: Unknown (11/10/2022)   [place]    Feeling of Stress : Patient refused  Social Connections: Socially Integrated (11/10/2022)   Social Connection and Isolation Panel [NHANES]    Frequency of Communication with Friends and Family: More than three times a week    Frequency of Social Gatherings with Friends and Family: Patient refused    Attends Religious Services: More than 4 times per year  Active Member of Clubs or Organizations: Yes    Attends Archivist Meetings: More than 4 times per year    Marital Status: Married   Vitals:   11/11/22 1047  BP: 126/70  Pulse: 91  Resp: 16  Temp: 98.4 F (36.9 C)  SpO2: 98%   Body mass index is 25.64 kg/m.  Physical Exam Vitals and nursing note reviewed.  Constitutional:      General: She is not in acute distress.    Appearance: She is well-developed.  HENT:     Head: Normocephalic and atraumatic.     Mouth/Throat:     Mouth: Mucous membranes are moist.     Pharynx: Oropharynx is clear. No pharyngeal swelling.   Eyes:     Conjunctiva/sclera: Conjunctivae normal.  Cardiovascular:     Rate and Rhythm: Normal rate and regular rhythm.     Heart sounds: No murmur heard. Pulmonary:     Effort: Pulmonary effort is normal. No respiratory distress.     Breath sounds: Normal breath sounds.  Abdominal:     Palpations: Abdomen is soft. There is no mass.     Tenderness: There is no abdominal tenderness.  Lymphadenopathy:     Cervical: No cervical adenopathy.  Skin:    General: Skin is warm.     Findings: No erythema or rash.     Comments: Right index finger nail with mild erythema and elevated toenail,proximally, above cuticle. Nail is smooth, no  periungual erythema.  Neurological:     General: No focal deficit present.     Mental Status: She is alert and oriented to person, place, and time.     Cranial Nerves: No cranial nerve deficit.     Gait: Gait normal.  Psychiatric:        Mood and Affect: Mood and affect normal.   ASSESSMENT AND PLAN: Ms. Lockmiller is a 72 year old female seen today for mandibular pain among other concerns.  Osteonecrosis of jaw [facility]) Assessment & Plan: We discussed diagnosis and possible etiologies. She is holding on Prolia for now until she sees or hears from endocrinologist. Following with dentist, oral surgeon, and periodontist.  Orders: -     HYDROcodone-Acetaminophen; Take 1 tablet by mouth every 12 (twelve) hours as needed for moderate pain.  Dispense: 30 tablet; Refill: 0  Mandibular pain Assessment & Plan: She is reporting that a piece of tooth was left after dental procedure and closed to a nerve, which could be contributing to her pain along with mandibular osteonecrosis.  According to patient, she is awaiting for appointment with neurologist. In the past she has tried Lyrica for lumbar radicular pain and has been well-tolerated. Today we started Lyrica 25 mg, which she will titrate up from once daily at bedtime to 3 times daily, increasing dose every 2 weeks as tolerated. We discussed some side effects. For pain management recommend hydrocodone-acetaminophen 5-325 mg twice daily as needed, she can also take Tylenol 500 mg up to 2 times per day. Follow-up in 6 weeks, when we will consider signing a medication contract.   Orders: -     Pregabalin; Take 1 capsule (25 mg total) by mouth 2 (two) times daily.  Dispense: 90 capsule; Refill: 1 -     HYDROcodone-Acetaminophen; Take 1 tablet by mouth every 12 (twelve) hours as needed for moderate pain.  Dispense: 30 tablet; Refill: 0  Fingernail abnormalities Assessment & Plan: Right index, it seems a lesion in between fingernail and bed nail.   Recommend  soaking finger in Epsom salt with warm water daily for a few days. Monitor for new symptoms. Will consider dermatology evaluation of not resolved or gets worse.   Depression, major, in remission [facility]) Assessment & Plan: Problem is stable. Currently she is on Wellbutrin XL 300 mg and fluoxetine 20 mg daily. Problem has been managed by her gynecologist.   Stage 3a chronic kidney disease ([place]) Assessment & Plan: Problem has been stable, on 10/11/22 Cr 1.01 and e GFR 55. Continue adequate hydration, low-salt diet, and avoidance of NSAIDs.   Osteoporosis without current pathological fracture, unspecified osteoporosis type Assessment & Plan: She is currently on Prolia every 6 months, she is due tomorrow but she is holding on medication until she hear from endocrinologist due to recent Dx of mandibular osteonecrosis. Continue adequate calcium and vitamin D supplementation.   Return in about 6 weeks (around 12/23/2022).  Katanya Schlie G. [name], MD  [facility]. [place] office.

## 2022-11-11 NOTE — Patient Instructions (Signed)
A few things to remember from today's visit:  Osteonecrosis of jaw ([place]) - Plan: HYDROcodone-acetaminophen (NORCO/VICODIN) 5-325 MG tablet  Jaw pain - Plan: pregabalin (LYRICA) 25 MG capsule, HYDROcodone-acetaminophen (NORCO/VICODIN) 5-325 MG tablet  Start Lyrica 25 mg at bedtime and in 2 weeks add a morning dose then in 2 weeks a noon dose, goal is to take it 3 times per day. Hydrocodone up to 2 times for pain management. Tylenol 500 mg 2 times max if needed.  If you need refills for medications you take chronically, please call your pharmacy. Do not use My Chart to request refills or for acute issues that need immediate attention. If you send a my chart message, it may take a few days to be addressed, specially if I am not in the office.  Please be sure medication list is accurate. If a new problem present, please set up appointment sooner than planned today.

## 2022-11-12 ENCOUNTER — Ambulatory Visit (INDEPENDENT_AMBULATORY_CARE_PROVIDER_SITE_OTHER): Payer: Medicare PPO

## 2022-11-12 VITALS — BP 112/70 | Wt 118.0 lb

## 2022-11-12 DIAGNOSIS — M810 Age-related osteoporosis without current pathological fracture: Secondary | ICD-10-CM

## 2022-11-12 MED ORDER — DENOSUMAB 60 MG/ML ~~LOC~~ SOSY
60.0000 mg | PREFILLED_SYRINGE | Freq: Once | SUBCUTANEOUS | Status: AC
  Administered 2022-11-12: 60 mg via SUBCUTANEOUS

## 2022-11-12 NOTE — Progress Notes (Signed)
After obtaining consent, and per orders of Dr. Cruzita Lederer, injection of Prolia 47m  given by Morrill Bomkamp L Chief Walkup right arm SQ. Patient instructed to remain in clinic for 20 minutes afterwards, and to report any adverse reaction to me immediately.

## 2022-11-14 NOTE — Telephone Encounter (Signed)
Last Prolia inj 11/12/22 Next Prolia inj due 05/14/23

## 2022-11-17 ENCOUNTER — Encounter: Payer: Self-pay | Admitting: Family Medicine

## 2022-11-17 DIAGNOSIS — M879 Osteonecrosis, unspecified: Secondary | ICD-10-CM

## 2022-11-17 DIAGNOSIS — R6884 Jaw pain: Secondary | ICD-10-CM

## 2022-11-22 NOTE — Progress Notes (Signed)
GYNECOLOGY  VISIT   HPI: 72 y.o.   Married  Caucasian  female   G2P2002 with Patient's last menstrual period was 10/01/1995 (approximate).   here for   U/S consult  Small pea size mass to the left of the cervix and lower uterine segment was noted at the time of routine pelvic exam 10/14/22.   No pelvic pain but is dealing with current dental pain.   GYNECOLOGIC HISTORY: Patient's last menstrual period was 10/01/1995 (approximate). Contraception:  PMP Menopausal hormone therapy:  n/a Last mammogram:   06/06/22 Breast Density Category B, BI-RADS CATEGORY 1 Negative   Last pap smear:    09/17/21 negative: HR HPV negative, 08/28/20 negative         OB History     Gravida  2   Para  2   Term  2   Preterm      AB      Living  2      SAB      IAB      Ectopic      Multiple      Live Births                 Patient Active Problem List   Diagnosis Date Noted   Stage 3a chronic kidney disease ([place]) 11/11/2022   Osteonecrosis of jaw ([place]) 11/11/2022   Mandibular pain 11/11/2022   Fingernail abnormalities 11/11/2022   S/P total right hip arthroplasty 09/04/2021   ETD (Eustachian tube dysfunction), bilateral 12/19/2020   Right acute serous otitis media 12/19/2020   Sensorineural hearing loss (SNHL) of both ears 12/19/2020   Osteoporosis without current pathological fracture 10/03/2020   Hearing loss of left ear 04/07/2018   OSA (obstructive sleep apnea) 02/04/2017   Osteoarthritis, multiple sites 08/06/2016   Anemia 02/17/2013   Hypoalbuminemia 02/17/2013   Depression, major, in remission ([place]) 08/02/2009   Osteopenia 08/02/2009   Fatigue 08/02/2009   COLONIC POLYPS, HX OF 08/02/2009   DEGENERATIVE [place] DISEASE 10/27/2008    Past Medical History:  Diagnosis Date   Anxiety    Cataract    bilateral removed   Colonic polyp 2005 & 2013   DDD (degenerative disc disease)    Depression    GERD (gastroesophageal reflux disease)    H/O echocardiogram    before  2000, no need for f/u /w cardiac    Heart murmur    MVP- echo- 2010, no symptoms    Memory disorder 09/19/2017   Mononucleosis 1971   Osteopenia after menopause    Sleep apnea     Past Surgical History:  Procedure Laterality Date   ANTERIOR CERVICAL DECOMP/DISCECTOMY FUSION N/A 05/20/2013   Procedure: ANTERIOR CERVICAL DECOMPRESSION/DISCECTOMY FUSION 2 LEVELS;  Surgeon: Erline Levine, MD;  Location: [facility];  Service: Neurosurgery;  Laterality: N/A;  Cervical Seven-Thoracic One, Thoracic One-Two Anterior cervical decompression/Diskectomy/Fusion   CARPAL TUNNEL RELEASE Right    CATARACT EXTRACTION, BILATERAL  10/01/2011   Dr Gershon Crane, Viona Gilmore IOL   CERVICAL FUSION  2014   3 proceduresr Stern, 2007, 20012 and 2014   COLONOSCOPY  2018   SA- TA   colonoscopy with polypectomy  10/01/2011   Dr Deatra Ina   KNEE SURGERY     Dr Wonda Olds ; bursa cystectomy   LUMBAR DISC SURGERY  2010   Dr.Stern and [place]   TOTAL HIP ARTHROPLASTY Right 09/04/2021   Procedure: TOTAL HIP ARTHROPLASTY ANTERIOR APPROACH;  Surgeon: Percell Miller,  Ernesta Amble, MD;  Location: [facility] ORS;  Service: Orthopedics;  Laterality: Right;   [place]   as a baby   WRIST FRACTURE SURGERY Left 02/28/2013   Dr. Kathryne Hitch    Current Outpatient Medications  Medication Sig Dispense Refill   acetaminophen (TYLENOL) 650 MG CR tablet Take 1,300 mg by mouth every 8 (eight) hours as needed for pain.     amoxicillin-clavulanate (AUGMENTIN) 875-125 MG tablet Take 1 tablet by mouth 2 (two) times daily.     BIOTIN PO Take 1 tablet by mouth daily.     buPROPion (WELLBUTRIN XL) 300 MG 24 hr tablet Take 1 tablet (300 mg total) by mouth daily. 90 tablet 3   Calcium Carb-Cholecalciferol (CALCIUM 600 + D PO) Take 1 tablet by mouth daily.     chlorhexidine (PERIDEX) 0.12 % solution SMARTSIG:By Mouth     Cholecalciferol (VITAMIN D) 2000 UNITS CAPS Take 2,000 Units by mouth daily.     conjugated  estrogens (PREMARIN) vaginal cream      Cyanocobalamin (VITAMIN B-12) 1000 MCG/15ML LIQD      denosumab (PROLIA) 60 MG/ML SOSY injection Inject 60 mg into the skin every 6 (six) months. At MD office, last done 03/2022     diclofenac sodium (VOLTAREN) 1 % GEL Apply 2 g topically 4 (four) times daily. (Patient taking differently: Apply 2 g topically 4 (four) times daily as needed (pain).) 3 Tube 3   FLUoxetine (PROZAC) 20 MG tablet Take 1 tablet (20 mg total) by mouth daily. 90 tablet 3   HYDROcodone-acetaminophen (NORCO/VICODIN) 5-325 MG tablet Take 1 tablet by mouth every 12 (twelve) hours as needed for moderate pain. 30 tablet 0   Multiple Vitamin (MULTIVITAMIN WITH MINERALS) TABS tablet Take 1 tablet by mouth daily.     omeprazole (PRILOSEC) 40 MG capsule TAKE 1 CAPSULE (40 MG TOTAL) BY MOUTH DAILY. 30 capsule 3   pregabalin (LYRICA) 25 MG capsule Take 1 capsule (25 mg total) by mouth 2 (two) times daily. 90 capsule 1   traMADol (ULTRAM) 50 MG tablet      No current facility-administered medications for this visit.     ALLERGIES: Gabapentin  Family History  Problem Relation Age of Onset   Breast cancer Mother        estorgen receptor positive   Stroke Mother 90       Dec   Diabetes Father    Coronary artery disease Father        S/P CBAG , no MI -- Dec   Depression Father    Diverticulosis Father    Breast cancer Sister        estrogen receptor positive   Depression Sister    Depression Sister    Depression Sister    Depression Sister    Colon polyps Sister    Depression Brother    Colon cancer Maternal Grandfather    Bipolar disorder Other        Neice   Esophageal cancer Neg Hx    Pancreatic cancer Neg Hx    Rectal cancer Neg Hx    Stomach cancer Neg Hx     Social History   Socioeconomic History   Marital status: Married    Spouse name: Not on file   Number of children: Not on file   Years of education: 16+   Highest education level: Master's degree (e.g., MA,  MS, MEng, MEd, MSW, MBA)  Occupational History   Occupation: Pharmacist, hospital- Retired  Tobacco  Use   Smoking status: Never   Smokeless tobacco: Never  Vaping Use   Vaping Use: Never used  Substance and Sexual Activity   Alcohol use: Never   Drug use: Never   Sexual activity: Not Currently    Partners: Male    Birth control/protection: Post-menopausal  Other Topics Concern   Not on file  Social History Narrative   Lives   Caffeine use:    Right handed    Social Determinants of Health   Financial Resource Strain: Low Risk  (11/10/2022)   Overall Financial Resource Strain (CARDIA)    Difficulty of Paying Living Expenses: Not hard at all  Food Insecurity: No Food Insecurity (11/10/2022)   Hunger Vital Sign    Worried About Running Out of Food in the Last Year: Never true    Ran Out of Food in the Last Year: Never true  Transportation Needs: No Transportation Needs (11/10/2022)   PRAPARE - Hydrologist (Medical): No    Lack of Transportation (Non-Medical): No  Physical Activity: Insufficiently Active (11/10/2022)   Exercise Vital Sign    Days of Exercise per Week: 3 days    Minutes of Exercise per Session: 30 min  Stress: Unknown (11/10/2022)   [place]    Feeling of Stress : Patient refused  Social Connections: Socially Integrated (11/10/2022)   Social Connection and Isolation Panel [NHANES]    Frequency of Communication with Friends and Family: More than three times a week    Frequency of Social Gatherings with Friends and Family: Patient refused    Attends Religious Services: More than 4 times per year    Active Member of [employer] or Organizations: Yes    Attends Archivist Meetings: More than 4 times per year    Marital Status: Married  Human resources officer Violence: Not At Risk (12/13/2020)   Humiliation, Afraid, Rape, and Kick questionnaire    Fear of Current or Ex-Partner: No     Emotionally Abused: No    Physically Abused: No    Sexually Abused: No    Review of Systems  All other systems reviewed and are negative.   PHYSICAL EXAMINATION:    BP 126/74 (BP Location: Left Arm, Patient Position: Sitting, Cuff Size: Normal)   Pulse 95   Ht '4\' 9"'$  (1.448 m)   Wt 118 lb (53.5 kg)   LMP 10/01/1995 (Approximate)   SpO2 98%   BMI 25.53 kg/m     General appearance: alert, cooperative and appears stated age   Pelvic US Uterus 4.42 x 2.98 x 2.44 cm.  Peripheral calcifications noted.  No myometrial masses.   EMS 2.37 mm.  Fluid noted in the endometrial canal.  Left ovary 3.62 x 1.62 x 2.49 cm.  Ovary is echogenic with large vascular component. Right ovary 2.24 x 1.31 x 1.20 cm. 9 x 6 mm simple follicle noted.  Trace free fluid.   ASSESSMENT  Pelvic mass of left ovary.  Solid with increased vascularity.  Fluid in the endometrial canal.   PLAN  US findings and procedure discussed. Will check CA125 (and CEA). Return for endometrial biopsy next week.  I discussed the possibility of pelvic MRI and surgical care.  Final plan to follow.    An After Visit Summary was printed and given to the patient.  27 min  total time was spent for this patient encounter, including preparation, face-to-face counseling with the patient, coordination of care,  and documentation of the encounter.

## 2022-11-27 ENCOUNTER — Other Ambulatory Visit: Payer: Self-pay | Admitting: Family Medicine

## 2022-11-27 DIAGNOSIS — K219 Gastro-esophageal reflux disease without esophagitis: Secondary | ICD-10-CM

## 2022-11-28 DIAGNOSIS — G4733 Obstructive sleep apnea (adult) (pediatric): Secondary | ICD-10-CM | POA: Diagnosis not present

## 2022-12-03 DIAGNOSIS — T458X5A Adverse effect of other primarily systemic and hematological agents, initial encounter: Secondary | ICD-10-CM | POA: Diagnosis not present

## 2022-12-03 DIAGNOSIS — K136 Irritative hyperplasia of oral mucosa: Secondary | ICD-10-CM | POA: Diagnosis not present

## 2022-12-03 DIAGNOSIS — M272 Inflammatory conditions of jaws: Secondary | ICD-10-CM | POA: Diagnosis not present

## 2022-12-03 DIAGNOSIS — M87180 Osteonecrosis due to drugs, jaw: Secondary | ICD-10-CM | POA: Diagnosis not present

## 2022-12-05 ENCOUNTER — Encounter: Payer: Self-pay | Admitting: Obstetrics and Gynecology

## 2022-12-05 ENCOUNTER — Ambulatory Visit (INDEPENDENT_AMBULATORY_CARE_PROVIDER_SITE_OTHER): Payer: Medicare PPO

## 2022-12-05 ENCOUNTER — Ambulatory Visit (INDEPENDENT_AMBULATORY_CARE_PROVIDER_SITE_OTHER): Payer: Medicare PPO | Admitting: Obstetrics and Gynecology

## 2022-12-05 VITALS — BP 126/74 | HR 95 | Ht <= 58 in | Wt 118.0 lb

## 2022-12-05 DIAGNOSIS — R970 Elevated carcinoembryonic antigen [CEA]: Secondary | ICD-10-CM | POA: Diagnosis not present

## 2022-12-05 DIAGNOSIS — R1900 Intra-abdominal and pelvic swelling, mass and lump, unspecified site: Secondary | ICD-10-CM | POA: Diagnosis not present

## 2022-12-05 DIAGNOSIS — N859 Noninflammatory disorder of uterus, unspecified: Secondary | ICD-10-CM

## 2022-12-05 DIAGNOSIS — N852 Hypertrophy of uterus: Secondary | ICD-10-CM | POA: Diagnosis not present

## 2022-12-05 DIAGNOSIS — N951 Menopausal and female climacteric states: Secondary | ICD-10-CM | POA: Diagnosis not present

## 2022-12-05 NOTE — Progress Notes (Signed)
GYNECOLOGY  VISIT   HPI: 72 y.o.   Married  Caucasian  female   G2P2002 with Patient's last menstrual period was 10/01/1995 (approximate).   here for   endometrial biopsy for fluid in the endometrial canal.  [place] was ordered to evaluation a small left cervical/lower uterine segment mass noted on routine pelvic exam.   Patient denies postmenopausal bleeding.   Pelvic US 12/05/22 Uterus 4.42 x 2.98 x 2.44 cm.  Peripheral calcifications noted.  No myometrial masses.   EMS 2.37 mm.  Fluid noted in the endometrial canal.  Left ovary 3.62 x 1.62 x 2.49 cm.  Ovary is echogenic with large vascular component. Right ovary 2.24 x 1.31 x 1.20 cm. 9 x 6 mm simple follicle noted.  Trace free fluid.   Labs 12/05/22: CEA 4.4 and CA125 26.  Patient had unremarkable colonoscopy done 07/08/22.     GYNECOLOGIC HISTORY: Patient's last menstrual period was 10/01/1995 (approximate). Contraception:  PMP Menopausal hormone therapy:  N/A Last mammogram:  06/06/22 Breast Density Category B, BI-RADS CATEGORY 1 Negative    Last pap smear:   09/17/21 negative: HR HPV negative, 08/28/20 negative          OB History     Gravida  2   Para  2   Term  2   Preterm      AB      Living  2      SAB      IAB      Ectopic      Multiple      Live Births                 Patient Active Problem List   Diagnosis Date Noted   Stage 3a chronic kidney disease ([place]) 11/11/2022   Osteonecrosis of jaw ([place]) 11/11/2022   Mandibular pain 11/11/2022   Fingernail abnormalities 11/11/2022   S/P total right hip arthroplasty 09/04/2021   ETD (Eustachian tube dysfunction), bilateral 12/19/2020   Right acute serous otitis media 12/19/2020   Sensorineural hearing loss (SNHL) of both ears 12/19/2020   Osteoporosis without current pathological fracture 10/03/2020   Hearing loss of left ear 04/07/2018   OSA (obstructive sleep apnea) 02/04/2017   Osteoarthritis, multiple sites 08/06/2016   Anemia 02/17/2013    Hypoalbuminemia 02/17/2013   Depression, major, in remission ([place]) 08/02/2009   Osteopenia 08/02/2009   Fatigue 08/02/2009   COLONIC POLYPS, HX OF 08/02/2009   DEGENERATIVE [place] DISEASE 10/27/2008    Past Medical History:  Diagnosis Date   Anxiety    Cataract    bilateral removed   Colonic polyp 2005 & 2013   DDD (degenerative disc disease)    Depression    GERD (gastroesophageal reflux disease)    H/O echocardiogram    before 2000, no need for f/u /w cardiac    Heart murmur    MVP- echo- 2010, no symptoms    Memory disorder 09/19/2017   Mononucleosis 1971   Osteopenia after menopause    Sleep apnea     Past Surgical History:  Procedure Laterality Date   ANTERIOR CERVICAL DECOMP/DISCECTOMY FUSION N/A 05/20/2013   Procedure: ANTERIOR CERVICAL DECOMPRESSION/DISCECTOMY FUSION 2 LEVELS;  Surgeon: Erline Levine, MD;  Location: [facility];  Service: Neurosurgery;  Laterality: N/A;  Cervical Seven-Thoracic One, Thoracic One-Two Anterior cervical decompression/Diskectomy/Fusion   CARPAL TUNNEL RELEASE Right    CATARACT EXTRACTION, BILATERAL  10/01/2011   Dr Gershon Crane, [facility] IOL   CERVICAL FUSION  2014   3  proceduresr Vertell Limber, 2007, 20012 and 2014   COLONOSCOPY  2018   SA- TA   colonoscopy with polypectomy  10/01/2011   Dr Deatra Ina   KNEE SURGERY     Dr Wonda Olds ; bursa cystectomy   LUMBAR DISC SURGERY  2010   Dr.Stern and [place]   TOTAL HIP ARTHROPLASTY Right 09/04/2021   Procedure: TOTAL HIP ARTHROPLASTY ANTERIOR APPROACH;  Surgeon: Renette Butters, MD;  Location: [facility];  Service: Orthopedics;  Laterality: Right;   [place]   as a baby   WRIST FRACTURE SURGERY Left 02/28/2013   Dr. Kathryne Hitch    Current Outpatient Medications  Medication Sig Dispense Refill   BIOTIN PO Take 1 tablet by mouth daily.     buPROPion (WELLBUTRIN XL) 300 MG 24 hr tablet Take 1 tablet (300 mg total) by mouth daily. 90 tablet 3   Calcium  Carb-Cholecalciferol (CALCIUM 600 + D PO) Take 1 tablet by mouth daily.     chlorhexidine (PERIDEX) 0.12 % solution SMARTSIG:By Mouth     Cholecalciferol (VITAMIN D) 2000 UNITS CAPS Take 2,000 Units by mouth daily.     Cyanocobalamin (VITAMIN B-12) 1000 MCG/15ML LIQD      denosumab (PROLIA) 60 MG/ML SOSY injection Inject 60 mg into the skin every 6 (six) months. At MD office, last done 03/2022     diclofenac sodium (VOLTAREN) 1 % GEL Apply 2 g topically 4 (four) times daily. (Patient taking differently: Apply 2 g topically 4 (four) times daily as needed (pain).) 3 Tube 3   FLUoxetine (PROZAC) 20 MG tablet Take 1 tablet (20 mg total) by mouth daily. 90 tablet 3   Multiple Vitamin (MULTIVITAMIN WITH MINERALS) TABS tablet Take 1 tablet by mouth daily.     omeprazole (PRILOSEC) 40 MG capsule TAKE 1 CAPSULE (40 MG TOTAL) BY MOUTH DAILY. 30 capsule 3   No current facility-administered medications for this visit.     ALLERGIES: Gabapentin  Family History  Problem Relation Age of Onset   Breast cancer Mother        estorgen receptor positive   Stroke Mother 90       Dec   Diabetes Father    Coronary artery disease Father        S/P CBAG , no MI -- Dec   Depression Father    Diverticulosis Father    Breast cancer Sister        estrogen receptor positive   Depression Sister    Depression Sister    Depression Sister    Depression Sister    Colon polyps Sister    Depression Brother    Colon cancer Maternal Grandfather    Bipolar disorder Other        Neice   Esophageal cancer Neg Hx    Pancreatic cancer Neg Hx    Rectal cancer Neg Hx    Stomach cancer Neg Hx     Social History   Socioeconomic History   Marital status: Married    Spouse name: Not on file   Number of children: Not on file   Years of education: 16+   Highest education level: Master's degree (e.g., MA, MS, MEng, MEd, MSW, MBA)  Occupational History   Occupation: Pharmacist, hospital- Retired  Tobacco Use   Smoking status:  Never   Smokeless tobacco: Never  Vaping Use   Vaping Use: Never used  Substance and Sexual Activity   Alcohol  use: Never   Drug use: Never   Sexual activity: Not Currently    Partners: Male    Birth control/protection: Post-menopausal  Other Topics Concern   Not on file  Social History Narrative   Lives   Caffeine use:    Right handed    Social Determinants of Health   Financial Resource Strain: Low Risk  (11/10/2022)   Overall Financial Resource Strain (CARDIA)    Difficulty of Paying Living Expenses: Not hard at all  Food Insecurity: No Food Insecurity (11/10/2022)   Hunger Vital Sign    Worried About Running Out of Food in the Last Year: Never true    Ran Out of Food in the Last Year: Never true  Transportation Needs: No Transportation Needs (11/10/2022)   PRAPARE - Hydrologist (Medical): No    Lack of Transportation (Non-Medical): No  Physical Activity: Insufficiently Active (11/10/2022)   Exercise Vital Sign    Days of Exercise per Week: 3 days    Minutes of Exercise per Session: 30 min  Stress: Unknown (11/10/2022)   [place]    Feeling of Stress : Patient refused  Social Connections: Socially Integrated (11/10/2022)   Social Connection and Isolation Panel [NHANES]    Frequency of Communication with Friends and Family: More than three times a week    Frequency of Social Gatherings with Friends and Family: Patient refused    Attends Religious Services: More than 4 times per year    Active Member of [employer] or Organizations: Yes    Attends Archivist Meetings: More than 4 times per year    Marital Status: Married  Human resources officer Violence: Not At Risk (12/13/2020)   Humiliation, Afraid, Rape, and Kick questionnaire    Fear of Current or Ex-Partner: No    Emotionally Abused: No    Physically Abused: No    Sexually Abused: No    Review of Systems  All other  systems reviewed and are negative.   PHYSICAL EXAMINATION:    BP 126/74 (BP Location: Left Arm, Patient Position: Sitting, Cuff Size: Normal)   Pulse (!) 115   Ht '4\' 9"'$  (1.448 m)   Wt 118 lb (53.5 kg)   LMP 10/01/1995 (Approximate)   SpO2 97%   BMI 25.53 kg/m     General appearance: alert, cooperative and appears stated age   EMB: Consent for procedure. Sterile prep with Hibiclens. Paracervical block with 8 cc 1% lidocaine 6 cc, lot number [id number], expiration 03/2025. Tenaculum to anterior cervical lip. Pipelle passed to almost 6 cm twice.   Clear yellow tinged fluid noted with very minimal tissue.  Tissue to pathology.  Minimal EBL. No complications.   Chaperone was present for exam:  EMily  ASSESSMENT  Fluid in endometrial canal.   PLAN  FU endometrial biopsy.  Post biopsy precautions to patient.  Will schedule pelvic/abdominal MRI with contrast.   Anticipate referral to Dr. Berline Lopes.    An After Visit Summary was printed and given to the patient.  20 min  total time was spent for this patient encounter, including preparation, face-to-face counseling with the patient, coordination of care, and documentation of the encounter.

## 2022-12-06 LAB — CEA: CEA: 4.4 ng/mL — ABNORMAL HIGH

## 2022-12-06 LAB — CA 125: CA 125: 26 U/mL (ref <35)

## 2022-12-09 ENCOUNTER — Ambulatory Visit (INDEPENDENT_AMBULATORY_CARE_PROVIDER_SITE_OTHER): Payer: Medicare PPO | Admitting: Obstetrics and Gynecology

## 2022-12-09 ENCOUNTER — Telehealth: Payer: Self-pay | Admitting: Obstetrics and Gynecology

## 2022-12-09 ENCOUNTER — Other Ambulatory Visit (HOSPITAL_COMMUNITY)
Admission: RE | Admit: 2022-12-09 | Discharge: 2022-12-09 | Disposition: A | Discharge status: Home / Self Care | Payer: Medicare PPO | Source: Ambulatory Visit | Attending: Obstetrics and Gynecology | Admitting: Obstetrics and Gynecology

## 2022-12-09 ENCOUNTER — Encounter: Payer: Self-pay | Admitting: Obstetrics and Gynecology

## 2022-12-09 ENCOUNTER — Telehealth: Payer: Self-pay | Admitting: Family Medicine

## 2022-12-09 ENCOUNTER — Other Ambulatory Visit: Payer: Self-pay | Admitting: Obstetrics and Gynecology

## 2022-12-09 VITALS — BP 126/74 | HR 115 | Ht <= 58 in | Wt 118.0 lb

## 2022-12-09 DIAGNOSIS — N859 Noninflammatory disorder of uterus, unspecified: Secondary | ICD-10-CM | POA: Diagnosis not present

## 2022-12-09 DIAGNOSIS — N838 Other noninflammatory disorders of ovary, fallopian tube and broad ligament: Secondary | ICD-10-CM

## 2022-12-09 DIAGNOSIS — N858 Other specified noninflammatory disorders of uterus: Secondary | ICD-10-CM | POA: Diagnosis not present

## 2022-12-09 NOTE — Telephone Encounter (Addendum)
I called patient but she was still in our waiting room waiting for her husband. I went out and spoke with her and took her the phone number information to be able to call and schedule her MRI   MRI abdomen with and without contrast and MRI of pelvis with and without contrast are scheduled for Thursday, March 14 at 2:20 and 3:20pm at [facility] in [place], [place]. Scheduled through [place]. Appt is in Epic. Will route to [place] high priority for PA inquiry with insurance.

## 2022-12-09 NOTE — Patient Instructions (Signed)
Endometrial Biopsy  An endometrial biopsy is a procedure to remove tissue samples from the endometrium, which is the lining of the uterus. The tissue that is removed can then be checked under a microscope for disease. This procedure is used to diagnose conditions such as endometrial cancer, endometrial tuberculosis, polyps, or other inflammatory conditions. This procedure may also be used to investigate uterine bleeding to determine where you are in your menstrual cycle or how your hormone levels are affecting the lining of the uterus. Tell a health care provider about: Any allergies you have. All medicines you are taking, including vitamins, herbs, eye drops, creams, and over-the-counter medicines. Any problems you or family members have had with anesthetic medicines. Any bleeding problems you have. Any surgeries you have had. Any medical conditions you have. Whether you are pregnant or may be pregnant. What are the risks? Your health care provider will talk with you about risks. These may include: Bleeding. Pelvic infection. Puncture of the wall of the uterus with the biopsy device (rare). Allergic reactions to medicines. What happens before the procedure? Keep a record of your menstrual cycles as told by your health care provider. You may need to schedule your procedure for a specific time in your cycle. Bring a sanitary pad in case you need to wear one after the procedure. Ask your health care provider about: Changing or stopping your regular medicines. These include any diabetes medicines or blood thinners you take. Taking medicines such as aspirin and ibuprofen. These medicines can thin your blood. Do not take these medicines unless your health care provider tells you to. Taking over-the-counter medicines, vitamins, herbs, and supplements. Plan to have someone take you home from the hospital or clinic. What happens during the procedure? You will lie on an exam table with your feet  and legs supported as in a pelvic exam. Your health care provider will insert an instrument into your vagina to see your cervix. Your cervix will be cleansed with an antiseptic solution. A medicine (local anesthetic) will be used to numb the cervix. A forceps instrument will be used to hold your cervix steady for the biopsy. A thin, rod-like instrument (uterine sound) will be inserted through your cervix to determine the length of your uterus and the location where the biopsy sample will be removed. A thin, flexible tube (catheter) will be inserted through your cervix and into the uterus. The catheter will be used to collect the biopsy sample from your endometrial tissue. The tube and instruments will be removed, and the tissue sample will be sent to a lab for examination. The procedure may vary among health care providers and hospitals. What happens after the procedure? Your blood pressure, heart rate, breathing rate, and blood oxygen level will be monitored until you leave the hospital or clinic. It is up to you to get the results of your procedure. Ask your health care provider, or the department that is doing the procedure, when your results will be ready. Summary An endometrial biopsy is a procedure to remove tissue samples from the endometrium, which is the lining of the uterus. This procedure is used to diagnose conditions such as endometrial cancer, endometrial tuberculosis, polyps, or other inflammatory conditions. It is up to you to get the results of your procedure. Ask your health care provider, or the department that is doing the procedure, when your results will be ready. This information is not intended to replace advice given to you by your health care provider. Make sure you   discuss any questions you have with your health care provider. Document Revised: 01/01/2022 Document Reviewed: 01/01/2022 Elsevier Patient Education  2023 Elsevier Inc.  

## 2022-12-09 NOTE — Telephone Encounter (Signed)
[place] to schedule their annual wellness visit. Appointment made for 12/12/22.  Jessica Casey AWV direct phone # [phone]

## 2022-12-09 NOTE — Telephone Encounter (Signed)
Thank you for the update and for helping out patient.

## 2022-12-09 NOTE — Telephone Encounter (Signed)
Please schedule pelvic and abdominal MRI with contrast for this week.  My patient has a solid, vascular mass of the left ovary.  Her CEA is elevated.

## 2022-12-09 NOTE — Telephone Encounter (Signed)
Orders placed. Per Scheduler's advice I marked them both STAT so that they can do what they need to fit patient in this week.  Will call patient as soon as visit is over and provide number to call to schedule.

## 2022-12-11 LAB — SURGICAL PATHOLOGY

## 2022-12-11 NOTE — Telephone Encounter (Signed)
Encounter reviewed and closed.  

## 2022-12-12 ENCOUNTER — Ambulatory Visit
Admission: RE | Admit: 2022-12-12 | Discharge: 2022-12-12 | Disposition: A | Discharge status: Home / Self Care | Payer: Medicare PPO | Source: Ambulatory Visit | Attending: Obstetrics and Gynecology | Admitting: Obstetrics and Gynecology

## 2022-12-12 ENCOUNTER — Telehealth: Payer: Self-pay | Admitting: Obstetrics and Gynecology

## 2022-12-12 ENCOUNTER — Telehealth: Payer: Medicare PPO | Admitting: Family Medicine

## 2022-12-12 DIAGNOSIS — N859 Noninflammatory disorder of uterus, unspecified: Secondary | ICD-10-CM

## 2022-12-12 DIAGNOSIS — K5732 Diverticulitis of large intestine without perforation or abscess without bleeding: Secondary | ICD-10-CM

## 2022-12-12 DIAGNOSIS — N838 Other noninflammatory disorders of ovary, fallopian tube and broad ligament: Secondary | ICD-10-CM

## 2022-12-12 DIAGNOSIS — N83202 Unspecified ovarian cyst, left side: Secondary | ICD-10-CM | POA: Diagnosis not present

## 2022-12-12 HISTORY — DX: Other noninflammatory disorders of ovary, fallopian tube and broad ligament: N83.8

## 2022-12-12 HISTORY — DX: Diverticulitis of large intestine without perforation or abscess without bleeding: K57.32

## 2022-12-12 MED ORDER — CIPROFLOXACIN HCL 500 MG PO TABS: 500.0000 mg | ORAL_TABLET | Freq: Two times a day (BID) | ORAL | 0 refills | Status: DC

## 2022-12-12 MED ORDER — GADOPICLENOL 0.5 MMOL/ML IV SOLN
6.0000 mL | Freq: Once | INTRAVENOUS | Status: AC | PRN
  Administered 2022-12-12: 6 mL via INTRAVENOUS

## 2022-12-12 MED ORDER — METRONIDAZOLE 500 MG PO TABS: 500.0000 mg | ORAL_TABLET | Freq: Three times a day (TID) | ORAL | 0 refills | Status: DC

## 2022-12-12 NOTE — Telephone Encounter (Signed)
Phone call to patient to discuss results of Abdominal/Pelvic MRI for evaluation of vascular left ovarian mass.   Patient does endorse left sided pain, which she does note with a bowel movement.  She states she has been dismissing the discomfort because she had a normal colonoscopy last fall.  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   Will plan for antibiotic therapy:  Flagyl 500 mg po tid x 14 days and Ciprofloxacin 500 mg po bid x 14 days.   I recommended a liquid diet.   I recommended follow up with her PCP, Dr. [name], early next week.  I will have my office assist in making this appointment.    Patient's gastroenterologist Dr. Thornton Park is no longer at [facility].   I will also make the referral to Dr. Valarie Cones, GYN Oncology, now that the patient's MRI results and her endometrial biopsy are complete.  Cc- Dr. [name]

## 2022-12-13 ENCOUNTER — Telehealth: Payer: Self-pay | Admitting: *Deleted

## 2022-12-13 ENCOUNTER — Encounter: Payer: Self-pay | Admitting: Obstetrics and Gynecology

## 2022-12-13 ENCOUNTER — Telehealth: Payer: Self-pay | Admitting: Family Medicine

## 2022-12-13 NOTE — Telephone Encounter (Signed)
Thank you for the update!

## 2022-12-13 NOTE — Telephone Encounter (Signed)
Jessica Casey from [facility]  [address]  FYI: Pt had an MRI on 12/12/22 Pt had acute diverticulitis Antibiotics sent by Dr. Quincy Simmonds

## 2022-12-13 NOTE — Telephone Encounter (Signed)
Spoke with pcp, okay to keep appt as scheduled.

## 2022-12-13 NOTE — Telephone Encounter (Signed)
LMOM for the patient to call the office back. Patient needs to be scheduled for a new patient appt the week on 4/1.

## 2022-12-13 NOTE — Telephone Encounter (Signed)
The appointment on 12/20/22 with Dr. [name] was a previously scheduled appointment.  Please reach out to her office with the new information that the patient has acute diverticulitis.  Her PCP will need to determine if she needs to be seen sooner.   I signed the referral to Dr. Berline Lopes.

## 2022-12-13 NOTE — Telephone Encounter (Signed)
Spoke with pcp, okay for leave patient's appointment as it is since she has started treatment.

## 2022-12-13 NOTE — Telephone Encounter (Signed)
Hey Dr. Quincy Simmonds, I didn't see the referral for Dr. Berline Lopes so I went ahead and sent that. Also, I see in pt's chart that she is scheduled for f/u w/ her PCP next week. However, it isn't until 12/20/2022, would you like me to contact their office to see if she can be sooner in the week or are you comfortable with that appt date? Please advise.

## 2022-12-13 NOTE — Telephone Encounter (Signed)
Jessica Casey w/ Lattie Haw at [facility] and she reported that she will make sure to get information to Dr. [name]. Will leave encounter open for f/u on referral to gyn onc.

## 2022-12-16 ENCOUNTER — Encounter: Payer: Self-pay | Admitting: Psychiatry

## 2022-12-16 DIAGNOSIS — H26492 Other secondary cataract, left eye: Secondary | ICD-10-CM | POA: Diagnosis not present

## 2022-12-16 DIAGNOSIS — Z961 Presence of intraocular lens: Secondary | ICD-10-CM | POA: Diagnosis not present

## 2022-12-16 DIAGNOSIS — H35363 Drusen (degenerative) of macula, bilateral: Secondary | ICD-10-CM | POA: Diagnosis not present

## 2022-12-16 DIAGNOSIS — H04123 Dry eye syndrome of bilateral lacrimal glands: Secondary | ICD-10-CM | POA: Diagnosis not present

## 2022-12-16 DIAGNOSIS — H52223 Regular astigmatism, bilateral: Secondary | ICD-10-CM | POA: Diagnosis not present

## 2022-12-16 NOTE — Telephone Encounter (Signed)
FYI. Pt scheduled @ [facility] on 12/30/2022. Will route to provider for final review and close encounter.

## 2022-12-16 NOTE — Telephone Encounter (Signed)
Spoke with Ms. Cosey regarding her referral to GYN oncology. She has an appointment scheduled with Dr. Ernestina Patches on 12/30/22 at 10:30. Patient agrees to date and time. She has been provided with office address and location. She is also aware of our mask and visitor policy. Patient verbalized understanding and will call with any questions.

## 2022-12-18 NOTE — Progress Notes (Unsigned)
HPI: Jessica Casey is a 72 y.o. female, who is here today for follow up. She was last seen on 11/11/22 for jaw pain, determined to be caused by osteonecrosis. Hydrocodone-Acetaminophen 5-325 mg and Lyrica 25 mg were recommended.   Since her last visit she underwent oral surgery to address mandibular pain, pathology confirmed the Dx of osteonecrosis and also reveled  a periodontal infectious process, for which she was started on Augmentin. Pain has resolved and she is not longer on Lyrica and Hydrocodone.  Her next appointment with maxillofacial surgeon is on April 8th.  She also saw her gynecologist, who ordered pelvic/transvaginal US and then abdominal -pelvic CT after noticing a mass during a routine pelvic examination,   Abdominal CT done on 12/12/22 showed wall thickening and inflammatory fat stranding about a prominent diverticulum of the distal sigmoid colon, for which she was prescribed Ciprofloxacin and Flagyl, both she is still taking.  She denies fever,chills,abdominal pain, nausea, vomiting, changes in bowel habits, blood in stool or melena.  CT also confirmed the presence of a left ovarian mass. 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 She is scheduled to see a gynecological oncologist for further evaluation.  Review of Systems  HENT:  Negative for mouth sores, sore throat and trouble swallowing.   Respiratory:  Negative for cough, shortness of breath and wheezing.   Cardiovascular:  Negative for chest pain and leg swelling.  Genitourinary:  Negative for decreased urine volume, dysuria and hematuria.  Skin:  Negative for rash.  Neurological:  Negative for syncope and weakness.  See other pertinent positives and negatives in HPI.  Current Outpatient Medications on File Prior to Visit  Medication Sig Dispense Refill   BIOTIN  PO Take 1 tablet by mouth daily.     buPROPion (WELLBUTRIN XL) 300 MG 24 hr tablet Take 1 tablet (300 mg total) by mouth daily. 90 tablet 3   Calcium Carb-Cholecalciferol (CALCIUM 600 + D PO) Take 1 tablet by mouth daily.     chlorhexidine (PERIDEX) 0.12 % solution SMARTSIG:By Mouth     Cholecalciferol (VITAMIN D) 2000 UNITS CAPS Take 2,000 Units by mouth daily.     Cyanocobalamin (VITAMIN B-12) 1000 MCG/15ML LIQD      denosumab (PROLIA) 60 MG/ML SOSY injection Inject 60 mg into the skin every 6 (six) months. At MD office, last done 03/2022     diclofenac sodium (VOLTAREN) 1 % GEL Apply 2 g topically 4 (four) times daily. (Patient taking differently: Apply 2 g topically 4 (four) times daily as needed (pain).) 3 Tube 3   FLUoxetine (PROZAC) 20 MG tablet Take 1 tablet (20 mg total) by mouth daily. 90 tablet 3   Multiple Vitamin (MULTIVITAMIN WITH MINERALS) TABS tablet Take 1 tablet by mouth daily.     omeprazole (PRILOSEC) 40 MG capsule TAKE 1 CAPSULE (40 MG TOTAL) BY MOUTH DAILY. 30 capsule 3   No current facility-administered medications on file prior to visit.    Past Medical History:  Diagnosis Date   Anxiety    Cataract    bilateral removed   Colonic polyp 2005 & 2013   DDD (degenerative disc disease)    Depression    Diverticulitis large intestine 12/12/2022   GERD (gastroesophageal reflux disease)    H/O echocardiogram    before 2000, no need for f/u /w cardiac    Heart murmur  MVP- echo- 2010, no symptoms    Memory disorder 09/19/2017   Mononucleosis 1971   Osteopenia after menopause    Ovarian mass, left 12/12/2022   See MRI 12/12/22   Sleep apnea    Allergies  Allergen Reactions   Gabapentin     Tongue swelling and itching    Social History   Socioeconomic History   Marital status: Married    Spouse name: Not on file   Number of children: Not on file   Years of education: 16+   Highest education level: Master's degree (e.g., MA, MS, MEng, MEd, MSW, MBA)   Occupational History   Occupation: Pharmacist, hospital- Retired  Tobacco Use   Smoking status: Never   Smokeless tobacco: Never  Vaping Use   Vaping Use: Never used  Substance and Sexual Activity   Alcohol use: Never   Drug use: Never   Sexual activity: Not Currently    Partners: Male    Birth control/protection: Post-menopausal  Other Topics Concern   Not on file  Social History Narrative   Lives   Caffeine use:    Right handed    Social Determinants of Health   Financial Resource Strain: Low Risk  (11/10/2022)   Overall Financial Resource Strain (CARDIA)    Difficulty of Paying Living Expenses: Not hard at all  Food Insecurity: No Food Insecurity (12/16/2022)   Hunger Vital Sign    Worried About Running Out of Food in the Last Year: Never true    [place] in the Last Year: Never true  Transportation Needs: No Transportation Needs (12/16/2022)   PRAPARE - Hydrologist (Medical): No    Lack of Transportation (Non-Medical): No  Physical Activity: Insufficiently Active (11/10/2022)   Exercise Vital Sign    Days of Exercise per Week: 3 days    Minutes of Exercise per Session: 30 min  Stress: Patient Declined (11/10/2022)   [place]    Feeling of Stress : Patient declined  Social Connections: Socially Integrated (11/10/2022)   Social Connection and Isolation Panel [NHANES]    Frequency of Communication with Friends and Family: More than three times a week    Frequency of Social Gatherings with Friends and Family: Patient declined    Attends Religious Services: More than 4 times per year    Active Member of [employer] or Organizations: Yes    Attends Archivist Meetings: More than 4 times per year    Marital Status: Married   Vitals:   12/20/22 1221  BP: 126/78  Pulse: 100  Resp: 16  Temp: 98.7 F (37.1 C)  SpO2: 99%   Body mass index is 24.48 kg/m.  Physical  Exam Vitals and nursing note reviewed.  Constitutional:      General: She is not in acute distress.    Appearance: She is well-developed.  HENT:     Head: Normocephalic and atraumatic.     Mouth/Throat:     Mouth: Mucous membranes are moist.     Pharynx: Oropharynx is clear.  Eyes:     Conjunctiva/sclera: Conjunctivae normal.  Cardiovascular:     Rate and Rhythm: Normal rate and regular rhythm.     Heart sounds: No murmur heard. Pulmonary:     Effort: Pulmonary effort is normal. No respiratory distress.     Breath sounds: Normal breath sounds.  Abdominal:     Palpations: Abdomen is soft. There is no hepatomegaly or  mass.     Tenderness: There is no abdominal tenderness.  Lymphadenopathy:     Cervical: No cervical adenopathy.  Skin:    General: Skin is warm.     Findings: No erythema or rash.  Neurological:     General: No focal deficit present.     Mental Status: She is alert and oriented to person, place, and time.     Cranial Nerves: No cranial nerve deficit.     Gait: Gait normal.  Psychiatric:        Mood and Affect: Mood and affect normal.   ASSESSMENT AND PLAN:  Ms. Jessica Casey was seen today for medical management of chronic issues.  Diagnoses and all orders for this visit:  Diverticulitis, colon We discussed Dx,prognosis,and treatment options. Asymptomatic. She is already on Augmentin, which she was instructed to take until she follows with maxillofacial surgeon. Completed 7 days of Flagyn and Cipro, so recommend to discontinue. Instructed about warning signs. Recommend starting a daily probiotic, Align.  Osteonecrosis of jaw (HCC) Pain has resolved. She has resumed Prolia, follows with endocrinologist. She has an appt with maxillofacial surgeon on 01/06/23.  Mass of left ovary Asymptomatic. Follows with gyn and she has an appt with gyn oncologist.  Return if symptoms worsen or fail to improve, for keep next appointment.  Seymour Pavlak G. [name], MD  [facility]. [place] office.

## 2022-12-20 ENCOUNTER — Encounter: Payer: Self-pay | Admitting: Family Medicine

## 2022-12-20 ENCOUNTER — Ambulatory Visit (INDEPENDENT_AMBULATORY_CARE_PROVIDER_SITE_OTHER): Payer: Medicare PPO | Admitting: Family Medicine

## 2022-12-20 VITALS — BP 126/78 | HR 100 | Temp 98.7°F | Resp 16 | Ht <= 58 in | Wt 113.1 lb

## 2022-12-20 DIAGNOSIS — M879 Osteonecrosis, unspecified: Secondary | ICD-10-CM | POA: Diagnosis not present

## 2022-12-20 DIAGNOSIS — N838 Other noninflammatory disorders of ovary, fallopian tube and broad ligament: Secondary | ICD-10-CM | POA: Diagnosis not present

## 2022-12-20 DIAGNOSIS — K5732 Diverticulitis of large intestine without perforation or abscess without bleeding: Secondary | ICD-10-CM | POA: Diagnosis not present

## 2022-12-20 NOTE — Patient Instructions (Signed)
A few things to remember from today's visit:  Diverticulitis, colon  Osteonecrosis of jaw ([facility]) You can stop Cipoo and Flagyl. Continue Augmentin. Start a daily Probiotic, Align 1 cap daily. Start advancing your diet as tolerated.   If you need refills for medications you take chronically, please call your pharmacy. Do not use My Chart to request refills or for acute issues that need immediate attention. If you send a my chart message, it may take a few days to be addressed, specially if I am not in the office.  Please be sure medication list is accurate. If a new problem present, please set up appointment sooner than planned today.

## 2022-12-27 DIAGNOSIS — G4733 Obstructive sleep apnea (adult) (pediatric): Secondary | ICD-10-CM | POA: Diagnosis not present

## 2022-12-30 ENCOUNTER — Inpatient Hospital Stay: Payer: Medicare PPO

## 2022-12-30 ENCOUNTER — Inpatient Hospital Stay (HOSPITAL_BASED_OUTPATIENT_CLINIC_OR_DEPARTMENT_OTHER): Payer: Medicare PPO | Admitting: Gynecologic Oncology

## 2022-12-30 ENCOUNTER — Inpatient Hospital Stay: Payer: Medicare PPO | Attending: Psychiatry | Admitting: Psychiatry

## 2022-12-30 ENCOUNTER — Encounter: Payer: Self-pay | Admitting: Psychiatry

## 2022-12-30 ENCOUNTER — Other Ambulatory Visit: Payer: Self-pay

## 2022-12-30 VITALS — BP 126/59 | HR 84 | Temp 98.6°F | Resp 16 | Ht <= 58 in | Wt 114.8 lb

## 2022-12-30 DIAGNOSIS — Z800 Family history of malignant neoplasm of digestive organs: Secondary | ICD-10-CM | POA: Diagnosis not present

## 2022-12-30 DIAGNOSIS — M8580 Other specified disorders of bone density and structure, unspecified site: Secondary | ICD-10-CM | POA: Diagnosis not present

## 2022-12-30 DIAGNOSIS — D3912 Neoplasm of uncertain behavior of left ovary: Secondary | ICD-10-CM | POA: Insufficient documentation

## 2022-12-30 DIAGNOSIS — M879 Osteonecrosis, unspecified: Secondary | ICD-10-CM | POA: Insufficient documentation

## 2022-12-30 DIAGNOSIS — K5792 Diverticulitis of intestine, part unspecified, without perforation or abscess without bleeding: Secondary | ICD-10-CM | POA: Diagnosis not present

## 2022-12-30 DIAGNOSIS — G4730 Sleep apnea, unspecified: Secondary | ICD-10-CM | POA: Diagnosis not present

## 2022-12-30 DIAGNOSIS — R109 Unspecified abdominal pain: Secondary | ICD-10-CM | POA: Diagnosis not present

## 2022-12-30 DIAGNOSIS — N838 Other noninflammatory disorders of ovary, fallopian tube and broad ligament: Secondary | ICD-10-CM

## 2022-12-30 DIAGNOSIS — F419 Anxiety disorder, unspecified: Secondary | ICD-10-CM | POA: Diagnosis not present

## 2022-12-30 DIAGNOSIS — R634 Abnormal weight loss: Secondary | ICD-10-CM | POA: Insufficient documentation

## 2022-12-30 DIAGNOSIS — Z8719 Personal history of other diseases of the digestive system: Secondary | ICD-10-CM | POA: Insufficient documentation

## 2022-12-30 DIAGNOSIS — K219 Gastro-esophageal reflux disease without esophagitis: Secondary | ICD-10-CM | POA: Insufficient documentation

## 2022-12-30 DIAGNOSIS — Z79899 Other long term (current) drug therapy: Secondary | ICD-10-CM | POA: Insufficient documentation

## 2022-12-30 DIAGNOSIS — Z803 Family history of malignant neoplasm of breast: Secondary | ICD-10-CM | POA: Diagnosis not present

## 2022-12-30 DIAGNOSIS — F32A Depression, unspecified: Secondary | ICD-10-CM | POA: Insufficient documentation

## 2022-12-30 DIAGNOSIS — R011 Cardiac murmur, unspecified: Secondary | ICD-10-CM | POA: Insufficient documentation

## 2022-12-30 MED ORDER — TRAMADOL HCL 50 MG PO TABS: 50.0000 mg | ORAL_TABLET | Freq: Four times a day (QID) | ORAL | 0 refills | Status: DC | PRN

## 2022-12-30 MED ORDER — SENNOSIDES-DOCUSATE SODIUM 8.6-50 MG PO TABS: 2.0000 | ORAL_TABLET | Freq: Every day | ORAL | 0 refills | Status: DC

## 2022-12-30 NOTE — Progress Notes (Unsigned)
GYNECOLOGIC ONCOLOGY NEW PATIENT CONSULTATION  Date of Service: 12/30/2022 Referring Provider: Aundria Rud, MD   ASSESSMENT AND PLAN: Jessica Casey is a 72 y.o. 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  We reviewed that the exact etiology of the pelvic mass is unclear, but could include a benign, borderline, or malignant process. The MRI suggests a likely benign lesion of a fibroma or fibrothecoma. This could potentially be functional and contribute to the small fluid in the endometrial cavity, but fortunately endometrial biopsy was negative at this time. The recommended treatment is surgical excision to make a definitive diagnosis. Because the mass is relatively small, we feel that a minimally invasive approach is feasible, using robotic assistance.    In the event of malignancy or borderline tumor on frozen section, we will perform indicated staging procedures. We discussed that these procedures may include hysterectomy, omentectomy pelvic and/or para-aortic lymphadenectomy, peritoneal biopsies. We would also remove any tissue concerning for metastatic disease which could require additional procedures including bowel surgery.  We reviewed the evidence of diverticulitis on patient's imaging.  Discussed that this could increase the complexity of the surgery with possible findings of adhesions.  Also potential risk for increased infection and or postoperative abscess.  Patient should continue her Augmentin as prescribed at this time until discontinued by her jaw surgeon.  However, I do recommend that we wait to perform surgery for several weeks to allow for decreased inflammation.  Will see if one of the colorectal surgeons is able to review her imaging to determine if the prominent diverticulum appears closely involved with the ovarian mass such that a joint procedure would be indicated.  Patient was consented for: Robotic  assisted laparoscopic bilateral salpingo-oophorectomy, possible staging on 02/04/23.  The risks of surgery were discussed in detail and she understands these to including but not limited to bleeding requiring a blood transfusion, infection, injury to adjacent organs (including but not limited to the bowels, bladder, ureters, nerves, blood vessels), thromboembolic events, wound separation, hernia, vaginal cuff separation, possible risk of lymphedema and lymphocyst if lymphadenectomy performed, unforseen complication, and possible need for re-exploration.  If the patient experiences any of these events, she understands that her hospitalization or recovery may be prolonged and that she may need to take additional medications for a prolonged period. The patient will receive DVT and antibiotic prophylaxis as indicated. She voiced a clear understanding. She had the opportunity to ask questions and informed consent was obtained today. She wishes to proceed.  She will proceed to the lab today for inhibin a/b. She does not require preoperative clearance. Her METs are >4.  All preoperative instructions were reviewed. Postoperative expectations were also reviewed.   Addendum: Subsequent to this visit, discussed her case with one of the colorectal surgeons.  Will plan for patient to undergo a bowel prep prior to the surgery in case of any issues dissecting bowel from the ovarian mass.  At this time, I will proceed with surgery as planned as above.  Planned joint procedure does not appear to be necessary at this time.  A copy of this note was sent to the patient's referring provider.  Bernadene Bell, MD Gynecologic Oncology   Medical Decision Making I personally spent  TOTAL 55 minutes face-to-face and non-face-to-face in the care of this patient, which includes all pre, intra, and post visit time on the date of service.   ------------  CC: Pelvic  mass  HISTORY OF PRESENT ILLNESS:  Jessica Casey is a  72 y.o. woman who is seen in consultation at the request of Aundria Rud, MD for evaluation of left ovarian mass.  Patient underwent a pelvic ultrasound on 12/05/2022 due to a cervical/lower uterine segment mass noted on routine pelvic exam.  On ultrasound she was noted to have fluid in the endometrial canal and a 3.62 x 1.62 x 2.49 cm left ovary with echogenicity and large vascular component.  She presented to her OB/GYN on 12/09/2022 for an endometrial biopsy given the fluid noted on the pelvic ultrasound.  Endometrial biopsy returned with scant atrophic endometrium, negative for atypia.  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  MRI also noted wall thickening and inflammatory fat stranding about a prominent diverticulum of the distal sigmoid colon consistent with acute diverticulitis. Labs on 12/05/2022 showed a CEA of 4.4 and a CA125 of 26.  Today, patient presents with her husband.  She reports that her doctor initially plan to start her on Cipro/Flagyl, but she had already been on Augmentin for a few weeks for treatment of osteonecrosis of the jaw, so she was instructed just to continue on the Augmentin.  She is still taking the antibiotics and has follow-up with her general surgeon on 01/06/2023.  She had surgery for this 2 weeks ago.  Otherwise, patient reports some mild left-sided abdominal pain for couple of months.  She reports that she was instructed to go on a liquid diet for a week for the identification of diverticulitis.  During that time her bowels were soft, but now her bowels are back to regular.  She also reports about a 5 pound weight loss since Christmas.  The osteonecrosis of the jaw she believes started around October of last year.  She denies abdominal bloating, early satiety, change in bladder habits.   PAST MEDICAL HISTORY: Past Medical History:  Diagnosis Date    Anxiety    Cataract    bilateral removed   Colonic polyp 2005 & 2013   DDD (degenerative disc disease)    Depression    Diverticulitis large intestine 12/12/2022   GERD (gastroesophageal reflux disease)    H/O echocardiogram    before 2000, no need for f/u /w cardiac    Heart murmur    MVP- echo- 2010, no symptoms    Memory disorder 09/19/2017   Mononucleosis 1971   Osteopenia after menopause    Ovarian mass, left 12/12/2022   See MRI 12/12/22   Sleep apnea     PAST SURGICAL HISTORY: Past Surgical History:  Procedure Laterality Date   ANTERIOR CERVICAL DECOMP/DISCECTOMY FUSION N/A 05/20/2013   Procedure: ANTERIOR CERVICAL DECOMPRESSION/DISCECTOMY FUSION 2 LEVELS;  Surgeon: Erline Levine, MD;  Location: [facility];  Service: Neurosurgery;  Laterality: N/A;  Cervical Seven-Thoracic One, Thoracic One-Two Anterior cervical decompression/Diskectomy/Fusion   CARPAL TUNNEL RELEASE Right    CATARACT EXTRACTION, BILATERAL  10/01/2011   Dr Gershon Crane, [facility]   CERVICAL FUSION  2014   3 proceduresr Stern, 2007, 20012 and 2014   COLONOSCOPY  2018   [facility]- TA   colonoscopy with polypectomy  10/01/2011   Dr Deatra Ina   KNEE SURGERY     Dr Wonda Olds ; bursa cystectomy   LUMBAR [place] SURGERY  2010   Dr.Stern and [place]  TOTAL HIP ARTHROPLASTY Right 09/04/2021   Procedure: TOTAL HIP ARTHROPLASTY ANTERIOR APPROACH;  Surgeon: Renette Butters, MD;  Location: [facility];  Service: Orthopedics;  Laterality: Right;   [place]   as a baby   WRIST FRACTURE SURGERY Left 02/28/2013   Dr. Kathryne Hitch    OB/GYN HISTORY: OB History  Gravida Para Term Preterm AB Living  2 2 2     2   SAB IAB Ectopic Multiple Live Births          2    # Outcome Date GA Lbr Len/2nd Weight Sex Delivery Anes PTL Lv  2 Term      Vag-Spont   LIV  1 Term      Vag-Spont   LIV      Age at menarche: 89 Age at menopause: 59 Hx of HRT: yes, pills (not currently) Hx of  STI: no Last pap: 09/17/2021 negative, HPV negative History of abnormal pap smears: none  SCREENING STUDIES:  Last mammogram: 05/2022 Last colonoscopy: 07/08/2022, unremarkable per clinic note.  MEDICATIONS:  Current Outpatient Medications:    BIOTIN PO, Take 1 tablet by mouth daily., Disp: , Rfl:    buPROPion (WELLBUTRIN XL) 300 MG 24 hr tablet, Take 1 tablet (300 mg total) by mouth daily., Disp: 90 tablet, Rfl: 3   Calcium Carb-Cholecalciferol (CALCIUM 600 + D PO), Take 1 tablet by mouth daily., Disp: , Rfl:    chlorhexidine (PERIDEX) 0.12 % solution, SMARTSIG:By Mouth, Disp: , Rfl:    Cholecalciferol (VITAMIN D) 2000 UNITS CAPS, Take 2,000 Units by mouth daily., Disp: , Rfl:    Cyanocobalamin (VITAMIN B-12) 1000 MCG/15ML LIQD, , Disp: , Rfl:    denosumab (PROLIA) 60 MG/ML SOSY injection, Inject 60 mg into the skin every 6 (six) months. At MD office, last done 03/2022, Disp: , Rfl:    diclofenac sodium (VOLTAREN) 1 % GEL, Apply 2 g topically 4 (four) times daily. (Patient taking differently: Apply 2 g topically 4 (four) times daily as needed (pain).), Disp: 3 Tube, Rfl: 3   FLUoxetine (PROZAC) 20 MG tablet, Take 1 tablet (20 mg total) by mouth daily., Disp: 90 tablet, Rfl: 3   Multiple Vitamin (MULTIVITAMIN WITH MINERALS) TABS tablet, Take 1 tablet by mouth daily., Disp: , Rfl:    omeprazole (PRILOSEC) 40 MG capsule, TAKE 1 CAPSULE (40 MG TOTAL) BY MOUTH DAILY., Disp: 30 capsule, Rfl: 3   senna-docusate (SENOKOT-S) 8.6-50 MG tablet, Take 2 tablets by mouth at bedtime. For AFTER surgery, do not take if having diarrhea, Disp: 30 tablet, Rfl: 0   traMADol (ULTRAM) 50 MG tablet, Take 1 tablet (50 mg total) by mouth every 6 (six) hours as needed for severe pain. For AFTER surgery only, do not take and drive, Disp: 10 tablet, Rfl: 0   [START ON 02/03/2023] erythromycin base (E-MYCIN) 500 MG tablet, Take 2 tablets (1000 mg total) at 2 pm, 3 pm, and 10 pm the day BEFORE surgery, Disp: 6 tablet, Rfl: 0    [START ON 02/03/2023] neomycin (MYCIFRADIN) 500 MG tablet, Take 2 tablets (1000 mg total) at 2 pm, 3 pm, and 10 pm the day BEFORE surgery, Disp: 6 tablet, Rfl: 0  ALLERGIES: Allergies  Allergen Reactions   Gabapentin     Tongue swelling and itching     FAMILY HISTORY: Family History  Problem Relation Age of Onset   Breast cancer Mother        estorgen receptor positive   Stroke Mother 15  Dec   Diabetes Father    Coronary artery disease Father        S/P CBAG , no MI -- Dec   Depression Father    Diverticulosis Father    Breast cancer Sister        estrogen receptor positive   Depression Sister    Depression Sister    Depression Sister    Depression Sister    Colon polyps Sister    Depression Brother    Colon cancer Maternal Grandfather    Bipolar disorder Other        Neice   Esophageal cancer Neg Hx    Pancreatic cancer Neg Hx    Rectal cancer Neg Hx    Stomach cancer Neg Hx    Ovarian cancer Neg Hx    Endometrial cancer Neg Hx     SOCIAL HISTORY: Social History   Socioeconomic History   Marital status: Married    Spouse name: Not on file   Number of children: Not on file   Years of education: 16+   Highest education level: Master's degree (e.g., MA, MS, MEng, MEd, MSW, MBA)  Occupational History   Occupation: Pharmacist, hospital- Retired  Tobacco Use   Smoking status: Never   Smokeless tobacco: Never  Vaping Use   Vaping Use: Never used  Substance and Sexual Activity   Alcohol use: Never   Drug use: Never   Sexual activity: Not Currently    Partners: Male    Birth control/protection: Post-menopausal  Other Topics Concern   Not on file  Social History Narrative   Lives   Caffeine use:    Right handed    Social Determinants of Health   Financial Resource Strain: Low Risk  (11/10/2022)   Overall Financial Resource Strain (CARDIA)    Difficulty of Paying Living Expenses: Not hard at all  Food Insecurity: No Food Insecurity (12/16/2022)   Hunger Vital  Sign    Worried About Running Out of Food in the Last Year: Never true    Ran Out of Food in the Last Year: Never true  Transportation Needs: No Transportation Needs (12/16/2022)   PRAPARE - Hydrologist (Medical): No    Lack of Transportation (Non-Medical): No  Physical Activity: Insufficiently Active (11/10/2022)   Exercise Vital Sign    Days of Exercise per Week: 3 days    Minutes of Exercise per Session: 30 min  Stress: Patient Declined (11/10/2022)   [place]    Feeling of Stress : Patient declined  Social Connections: Socially Integrated (11/10/2022)   Social Connection and Isolation Panel [NHANES]    Frequency of Communication with Friends and Family: More than three times a week    Frequency of Social Gatherings with Friends and Family: Patient declined    Attends Religious Services: More than 4 times per year    Active Member of [employer] or Organizations: Yes    Attends Music therapist: More than 4 times per year    Marital Status: Married  Human resources officer Violence: Not At Risk (12/16/2022)   Humiliation, Afraid, Rape, and Kick questionnaire    Fear of Current or Ex-Partner: No    Emotionally Abused: No    Physically Abused: No    Sexually Abused: No    REVIEW OF SYSTEMS: New patient intake form was reviewed.  Complete 10-system review is negative except for the following: left abdominal pain  PHYSICAL EXAM: BP Marland Kitchen)  126/59 (BP Location: Left Arm, Patient Position: Sitting)   Pulse 84   Temp 98.6 F (37 C) (Oral)   Resp 16   Ht 4\' 9"  (1.448 m)   Wt 114 lb 12.8 oz (52.1 kg)   LMP 10/01/1995 (Approximate)   SpO2 98%   BMI 24.84 kg/m  Constitutional: No acute distress. Neuro/Psych: Alert, oriented.  Head and Neck: Normocephalic, atraumatic. Neck symmetric without masses. Sclera anicteric.  Respiratory: Normal work of breathing. Clear to auscultation  bilaterally. Cardiovascular: Regular rate and rhythm, no murmurs, rubs, or gallops. Abdomen: Normoactive bowel sounds. Soft, non-distended, non-tender to palpation. No masses or hepatosplenomegaly appreciated. No evidence of hernia. No palpable fluid wave.  Extremities: Grossly normal range of motion. Warm, well perfused. No edema bilaterally. Skin: No rashes or lesions. Lymphatic: No cervical, supraclavicular, or inguinal adenopathy. Genitourinary: External genitalia without lesions. Urethral meatus without lesions or prolapse. On speculum exam, vagina and cervix without lesions. Bimanual exam reveals normal cervix and small uterus. Fullness of the left adnexa. Rectovaginal exam confirms the above findings and reveals normal sphincter tone and no masses or nodularity. External hemorroids. Exam chaperoned by Joylene John, NP   LABORATORY AND RADIOLOGIC DATA: Outside medical records were reviewed to synthesize the above history, along with the history and physical obtained during the visit.  Outside laboratory, pathology, and imaging reports were reviewed, with pertinent results below.  I personally reviewed the outside images.  WBC  Date Value Ref Range Status  08/29/2021 13.2 (H) 4.0 - 10.5 K/uL Final   Hemoglobin  Date Value Ref Range Status  08/29/2021 11.6 (L) 12.0 - 15.0 g/dL Final   Hemoglobin, fingerstick  Date Value Ref Range Status  05/16/2014 12.3 12.0 - 16.0 g/dL Final   HCT  Date Value Ref Range Status  08/29/2021 36.3 36.0 - 46.0 % Final   Platelets  Date Value Ref Range Status  08/29/2021 351 150 - 400 K/uL Final   Creat  Date Value Ref Range Status  09/13/2019 1.11 (H) 0.50 - 0.99 mg/dL Final    Comment:    For patients >60 years of age, the reference limit for Creatinine is approximately 13% higher for people identified as African-American. .    Creatinine, Ser  Date Value Ref Range Status  10/11/2022 1.01 0.40 - 1.20 mg/dL Final   AST  Date Value Ref  Range Status  07/30/2021 28 0 - 37 U/L Final   ALT  Date Value Ref Range Status  07/30/2021 22 0 - 35 U/L Final   CA 125  Date Value Ref Range Status  12/05/2022 26 <35 U/mL Final    Comment:    . This test was performed using the Siemens  Chemiluminescent method. Values obtained from different assay methods cannot be used  interchangeably. CA 125 levels, regardless of  value, should not be interpreted as absolute evidence of the presence or absence of disease. .    CEA  Date Value Ref Range Status  12/05/2022 4.4 (H) ng/mL Final    Comment:    Non-Smoker: <2.5 Smoker:     <5.0 . . This test was performed using the Siemens  chemiluminescent method. Values obtained from different assay methods cannot be used interchangeably. CEA levels, regardless of value, should not be interpreted as absolute evidence of the presence or absence of disease. Marland Kitchen    FINAL MICROSCOPIC DIAGNOSIS:   A. ENDOMETRIUM, BIOPSY:  Scant atrophic endometrium and endometrial epithelium  Scant benign endocervical epithelium and metaplastic squamous epithelium  Negative  for atypia   MR PELVIS W WO CONTRAST 12/12/2022  Narrative CLINICAL DATA:  Left ovarian mass identified by prior ultrasound  EXAM: MRI ABDOMEN AND PELVIS WITHOUT AND WITH CONTRAST  TECHNIQUE: Multiplanar multisequence MR imaging of the abdomen and pelvis was performed both before and after the administration of intravenous contrast.  CONTRAST:  6 mL Vueway gadolinium contrast IV  COMPARISON:  Pelvic ultrasound, 12/05/2022  FINDINGS: COMBINED FINDINGS FOR BOTH MR ABDOMEN AND PELVIS  Lower chest: No acute abnormality.  Hepatobiliary: No solid liver abnormality is seen. No gallstones, gallbladder wall thickening, or biliary dilatation.  Pancreas: Unremarkable. No pancreatic ductal dilatation or surrounding inflammatory changes.  Spleen: Normal in size without significant abnormality.  Adrenals/Urinary Tract:  Adrenal glands are unremarkable. Kidneys are normal, without renal calculi, solid lesion, or hydronephrosis. Diverticulum of the left aspect of the urinary bladder (series 13, image 95).  Stomach/Bowel: Stomach is within normal limits. Appendix appears normal. Sigmoid diverticulosis. Wall thickening and inflammatory fat stranding about a prominent diverticulum of the distal sigmoid colon (series 3, image 19).  Vascular/Lymphatic: No significant vascular findings are present. No enlarged abdominal or pelvic lymph nodes.  Reproductive: Small volume of fluid in the endometrial cavity (series 5, image 13). Benign nabothian cyst of the cervix. Lobulated, somewhat heterogeneously appearing although predominantly T2 hypointense mass of the left ovary measuring 4.8 x 3.6 x 2.7 cm (series 3, image 19, image series 2, image 15). This is weakly contrast enhancing (series 13, image 88). Normal postmenopausal appearance of the right ovary.  Other: Small, fat containing left inguinal hernia.  No ascites.  Musculoskeletal: No acute or significant osseous findings. Status post right hip total arthroplasty with associated susceptibility artifact.  IMPRESSION: 1. Lobulated, predominantly T2 hypointense mass of the left ovary measuring 4.8 x 3.6 x 2.7 cm, which is weakly contrast enhancing. Imaging features suggest an ovarian fibroma or fibrothecoma. 2. Small volume fluid in the endometrial cavity, abnormal in the late postmenopausal setting. This may be due to estrogen secretion as ovarian tumors on the thecoma spectrum are frequently functional. There is furthermore a high association between thecoma spectrum ovarian tumors and concurrent endometrial malignancy. 3. Wall thickening and inflammatory fat stranding about a prominent diverticulum of the distal sigmoid colon, consistent with acute diverticulitis.  These results will be called to the ordering clinician or representative by the  Radiologist Assistant, and communication documented in the PACS or [employer].   Electronically Signed By: Delanna Ahmadi M.D. On: 12/12/2022 16:27   US PELVIS TRANSVAGINAL NON-OB (TV ONLY) 12/05/2022  Narrative Indication:  left sided cervical/lower uterine segment mass noted on routine pelvic exam.  Findings: Pelvic US Uterus 4.42 x 2.98 x 2.44 cm. Peripheral calcifications noted. No myometrial masses. EMS 2.37 mm. Fluid noted in the endometrial canal. Left ovary 3.62 x 1.62 x 2.49 cm.  Ovary is echogenic with large vascular component. Right ovary 2.24 x 1.31 x 1.20 cm. 9 x 6 mm simple follicle noted. Trace free fluid.  Impression:  Vascular left ovarian mass.  Fluid in endometrial canal.

## 2022-12-30 NOTE — H&P (View-Only) (Signed)
GYNECOLOGIC ONCOLOGY NEW PATIENT CONSULTATION  Date of Service: 12/30/2022 Referring Provider: Brook Amundson C Silva, MD   ASSESSMENT AND PLAN: Jessica Casey is a 71 y.o. 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  We reviewed that the exact etiology of the pelvic mass is unclear, but could include a benign, borderline, or malignant process. The MRI suggests a likely benign lesion of a fibroma or fibrothecoma. This could potentially be functional and contribute to the small fluid in the endometrial cavity, but fortunately endometrial biopsy was negative at this time. The recommended treatment is surgical excision to make a definitive diagnosis. Because the mass is relatively small, we feel that a minimally invasive approach is feasible, using robotic assistance.    In the event of malignancy or borderline tumor on frozen section, we will perform indicated staging procedures. We discussed that these procedures may include hysterectomy, omentectomy pelvic and/or para-aortic lymphadenectomy, peritoneal biopsies. We would also remove any tissue concerning for metastatic disease which could require additional procedures including bowel surgery.  We reviewed the evidence of diverticulitis on patient's imaging.  Discussed that this could increase the complexity of the surgery with possible findings of adhesions.  Also potential risk for increased infection and or postoperative abscess.  Patient should continue her Augmentin as prescribed at this time until discontinued by her jaw surgeon.  However, I do recommend that we wait to perform surgery for several weeks to allow for decreased inflammation.  Will see if one of the colorectal surgeons is able to review her imaging to determine if the prominent diverticulum appears closely involved with the ovarian mass such that a joint procedure would be indicated.  Patient was consented for: Robotic  assisted laparoscopic bilateral salpingo-oophorectomy, possible staging on 02/04/23.  The risks of surgery were discussed in detail and she understands these to including but not limited to bleeding requiring a blood transfusion, infection, injury to adjacent organs (including but not limited to the bowels, bladder, ureters, nerves, blood vessels), thromboembolic events, wound separation, hernia, vaginal cuff separation, possible risk of lymphedema and lymphocyst if lymphadenectomy performed, unforseen complication, and possible need for re-exploration.  If the patient experiences any of these events, she understands that her hospitalization or recovery may be prolonged and that she may need to take additional medications for a prolonged period. The patient will receive DVT and antibiotic prophylaxis as indicated. She voiced a clear understanding. She had the opportunity to ask questions and informed consent was obtained today. She wishes to proceed.  She will proceed to the lab today for inhibin a/b. She does not require preoperative clearance. Her METs are >4.  All preoperative instructions were reviewed. Postoperative expectations were also reviewed.   Addendum: Subsequent to this visit, discussed her case with one of the colorectal surgeons.  Will plan for patient to undergo a bowel prep prior to the surgery in case of any issues dissecting bowel from the ovarian mass.  At this time, I will proceed with surgery as planned as above.  Planned joint procedure does not appear to be necessary at this time.  A copy of this note was sent to the patient's referring provider.  Lundynn Cohoon, MD Gynecologic Oncology   Medical Decision Making I personally spent  TOTAL 55 minutes face-to-face and non-face-to-face in the care of this patient, which includes all pre, intra, and post visit time on the date of service.   ------------  CC: Pelvic   mass  HISTORY OF PRESENT ILLNESS:  Jessica Casey is a  71 y.o. woman who is seen in consultation at the request of Brook Amundson C Silva, MD for evaluation of left ovarian mass.  Patient underwent a pelvic ultrasound on 12/05/2022 due to a cervical/lower uterine segment mass noted on routine pelvic exam.  On ultrasound she was noted to have fluid in the endometrial canal and a 3.62 x 1.62 x 2.49 cm left ovary with echogenicity and large vascular component.  She presented to her OB/GYN on 12/09/2022 for an endometrial biopsy given the fluid noted on the pelvic ultrasound.  Endometrial biopsy returned with scant atrophic endometrium, negative for atypia.  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  MRI also noted wall thickening and inflammatory fat stranding about a prominent diverticulum of the distal sigmoid colon consistent with acute diverticulitis. Labs on 12/05/2022 showed a CEA of 4.4 and a CA125 of 26.  Today, patient presents with her husband.  She reports that her doctor initially plan to start her on Cipro/Flagyl, but she had already been on Augmentin for a few weeks for treatment of osteonecrosis of the jaw, so she was instructed just to continue on the Augmentin.  She is still taking the antibiotics and has follow-up with her general surgeon on 01/06/2023.  She had surgery for this 2 weeks ago.  Otherwise, patient reports some mild left-sided abdominal pain for couple of months.  She reports that she was instructed to go on a liquid diet for a week for the identification of diverticulitis.  During that time her bowels were soft, but now her bowels are back to regular.  She also reports about a 5 pound weight loss since Christmas.  The osteonecrosis of the jaw she believes started around October of last year.  She denies abdominal bloating, early satiety, change in bladder habits.   PAST MEDICAL HISTORY: Past Medical History:  Diagnosis Date    Anxiety    Cataract    bilateral removed   Colonic polyp 2005 & 2013   DDD (degenerative disc disease)    Depression    Diverticulitis large intestine 12/12/2022   GERD (gastroesophageal reflux disease)    H/O echocardiogram    before 2000, no need for f/u /w cardiac    Heart murmur    MVP- echo- 2010, no symptoms    Memory disorder 09/19/2017   Mononucleosis 1971   Osteopenia after menopause    Ovarian mass, left 12/12/2022   See MRI 12/12/22   Sleep apnea     PAST SURGICAL HISTORY: Past Surgical History:  Procedure Laterality Date   ANTERIOR CERVICAL DECOMP/DISCECTOMY FUSION N/A 05/20/2013   Procedure: ANTERIOR CERVICAL DECOMPRESSION/DISCECTOMY FUSION 2 LEVELS;  Surgeon: Joseph Stern, MD;  Location: [facility];  Service: Neurosurgery;  Laterality: N/A;  Cervical Seven-Thoracic One, Thoracic One-Two Anterior cervical decompression/Diskectomy/Fusion   CARPAL TUNNEL RELEASE Right    CATARACT EXTRACTION, BILATERAL  10/01/2011   Dr Shapiro, /W [facility]   CERVICAL FUSION  2014   3 proceduresr Stern, 2007, 20012 and 2014   COLONOSCOPY  2018   [facility]- [facility]   colonoscopy with polypectomy  10/01/2011   Dr Kaplan   KNEE SURGERY     Dr Presson ; bursa cystectomy   LUMBAR DISC SURGERY  2010   Dr.Stern and 1999   ROTATOR CUFF REPAIR     Dr.Wainer     TOTAL HIP ARTHROPLASTY Right 09/04/2021   Procedure: TOTAL HIP ARTHROPLASTY ANTERIOR APPROACH;  Surgeon: Murphy, Timothy D, MD;  Location: [facility];  Service: Orthopedics;  Laterality: Right;   UMBILICAL HERNIA REPAIR  1952   as a baby   WRIST FRACTURE SURGERY Left 02/28/2013   Dr. Daniel Murphy    OB/GYN HISTORY: OB History  Gravida Para Term Preterm AB Living  2 2 2     2  SAB IAB Ectopic Multiple Live Births          2    # Outcome Date GA Lbr Len/2nd Weight Sex Delivery Anes PTL Lv  2 Term      Vag-Spont   LIV  1 Term      Vag-Spont   LIV      Age at menarche: 13 Age at menopause: 41 Hx of HRT: yes, pills (not currently) Hx of  STI: no Last pap: 09/17/2021 negative, HPV negative History of abnormal pap smears: none  SCREENING STUDIES:  Last mammogram: 05/2022 Last colonoscopy: 07/08/2022, unremarkable per clinic note.  MEDICATIONS:  Current Outpatient Medications:    BIOTIN PO, Take 1 tablet by mouth daily., Disp: , Rfl:    buPROPion (WELLBUTRIN XL) 300 MG 24 hr tablet, Take 1 tablet (300 mg total) by mouth daily., Disp: 90 tablet, Rfl: 3   Calcium Carb-Cholecalciferol (CALCIUM 600 + D PO), Take 1 tablet by mouth daily., Disp: , Rfl:    chlorhexidine (PERIDEX) 0.12 % solution, SMARTSIG:By Mouth, Disp: , Rfl:    Cholecalciferol (VITAMIN D) 2000 UNITS CAPS, Take 2,000 Units by mouth daily., Disp: , Rfl:    Cyanocobalamin (VITAMIN B-12) 1000 MCG/15ML LIQD, , Disp: , Rfl:    denosumab (PROLIA) 60 MG/ML SOSY injection, Inject 60 mg into the skin every 6 (six) months. At MD office, last done 03/2022, Disp: , Rfl:    diclofenac sodium (VOLTAREN) 1 % GEL, Apply 2 g topically 4 (four) times daily. (Patient taking differently: Apply 2 g topically 4 (four) times daily as needed (pain).), Disp: 3 Tube, Rfl: 3   FLUoxetine (PROZAC) 20 MG tablet, Take 1 tablet (20 mg total) by mouth daily., Disp: 90 tablet, Rfl: 3   Multiple Vitamin (MULTIVITAMIN WITH MINERALS) TABS tablet, Take 1 tablet by mouth daily., Disp: , Rfl:    omeprazole (PRILOSEC) 40 MG capsule, TAKE 1 CAPSULE (40 MG TOTAL) BY MOUTH DAILY., Disp: 30 capsule, Rfl: 3   senna-docusate (SENOKOT-S) 8.6-50 MG tablet, Take 2 tablets by mouth at bedtime. For AFTER surgery, do not take if having diarrhea, Disp: 30 tablet, Rfl: 0   traMADol (ULTRAM) 50 MG tablet, Take 1 tablet (50 mg total) by mouth every 6 (six) hours as needed for severe pain. For AFTER surgery only, do not take and drive, Disp: 10 tablet, Rfl: 0   [START ON 02/03/2023] erythromycin base (E-MYCIN) 500 MG tablet, Take 2 tablets (1000 mg total) at 2 pm, 3 pm, and 10 pm the day BEFORE surgery, Disp: 6 tablet, Rfl: 0    [START ON 02/03/2023] neomycin (MYCIFRADIN) 500 MG tablet, Take 2 tablets (1000 mg total) at 2 pm, 3 pm, and 10 pm the day BEFORE surgery, Disp: 6 tablet, Rfl: 0  ALLERGIES: Allergies  Allergen Reactions   Gabapentin     Tongue swelling and itching     FAMILY HISTORY: Family History  Problem Relation Age of Onset   Breast cancer Mother        estorgen receptor positive   Stroke Mother 90         Dec   Diabetes Father    Coronary artery disease Father        S/P CBAG , no MI -- Dec   Depression Father    Diverticulosis Father    Breast cancer Sister        estrogen receptor positive   Depression Sister    Depression Sister    Depression Sister    Depression Sister    Colon polyps Sister    Depression Brother    Colon cancer Maternal Grandfather    Bipolar disorder Other        Neice   Esophageal cancer Neg Hx    Pancreatic cancer Neg Hx    Rectal cancer Neg Hx    Stomach cancer Neg Hx    Ovarian cancer Neg Hx    Endometrial cancer Neg Hx     SOCIAL HISTORY: Social History   Socioeconomic History   Marital status: Married    Spouse name: Not on file   Number of children: Not on file   Years of education: 16+   Highest education level: Master's degree (e.g., MA, MS, MEng, MEd, MSW, MBA)  Occupational History   Occupation: Teacher- Retired  Tobacco Use   Smoking status: Never   Smokeless tobacco: Never  Vaping Use   Vaping Use: Never used  Substance and Sexual Activity   Alcohol use: Never   Drug use: Never   Sexual activity: Not Currently    Partners: Male    Birth control/protection: Post-menopausal  Other Topics Concern   Not on file  Social History Narrative   Lives   Caffeine use:    Right handed    Social Determinants of Health   Financial Resource Strain: Low Risk  (11/10/2022)   Overall Financial Resource Strain (CARDIA)    Difficulty of Paying Living Expenses: Not hard at all  Food Insecurity: No Food Insecurity (12/16/2022)   Hunger Vital  Sign    Worried About Running Out of Food in the Last Year: Never true    Ran Out of Food in the Last Year: Never true  Transportation Needs: No Transportation Needs (12/16/2022)   PRAPARE - Transportation    Lack of Transportation (Medical): No    Lack of Transportation (Non-Medical): No  Physical Activity: Insufficiently Active (11/10/2022)   Exercise Vital Sign    Days of Exercise per Week: 3 days    Minutes of Exercise per Session: 30 min  Stress: Patient Declined (11/10/2022)   [employer] - Occupational Stress Questionnaire    Feeling of Stress : Patient declined  Social Connections: Socially Integrated (11/10/2022)   Social Connection and Isolation Panel [NHANES]    Frequency of Communication with Friends and Family: More than three times a week    Frequency of Social Gatherings with Friends and Family: Patient declined    Attends Religious Services: More than 4 times per year    Active Member of Clubs or Organizations: Yes    Attends Club or Organization Meetings: More than 4 times per year    Marital Status: Married  Intimate Partner Violence: Not At Risk (12/16/2022)   Humiliation, Afraid, Rape, and Kick questionnaire    Fear of Current or Ex-Partner: No    Emotionally Abused: No    Physically Abused: No    Sexually Abused: No    REVIEW OF SYSTEMS: New patient intake form was reviewed.  Complete 10-system review is negative except for the following: left abdominal pain  PHYSICAL EXAM: BP (!)   126/59 (BP Location: Left Arm, Patient Position: Sitting)   Pulse 84   Temp 98.6 F (37 C) (Oral)   Resp 16   Ht 4' 9" (1.448 m)   Wt 114 lb 12.8 oz (52.1 kg)   LMP 10/01/1995 (Approximate)   SpO2 98%   BMI 24.84 kg/m  Constitutional: No acute distress. Neuro/Psych: Alert, oriented.  Head and Neck: Normocephalic, atraumatic. Neck symmetric without masses. Sclera anicteric.  Respiratory: Normal work of breathing. Clear to auscultation  bilaterally. Cardiovascular: Regular rate and rhythm, no murmurs, rubs, or gallops. Abdomen: Normoactive bowel sounds. Soft, non-distended, non-tender to palpation. No masses or hepatosplenomegaly appreciated. No evidence of hernia. No palpable fluid wave.  Extremities: Grossly normal range of motion. Warm, well perfused. No edema bilaterally. Skin: No rashes or lesions. Lymphatic: No cervical, supraclavicular, or inguinal adenopathy. Genitourinary: External genitalia without lesions. Urethral meatus without lesions or prolapse. On speculum exam, vagina and cervix without lesions. Bimanual exam reveals normal cervix and small uterus. Fullness of the left adnexa. Rectovaginal exam confirms the above findings and reveals normal sphincter tone and no masses or nodularity. External hemorroids. Exam chaperoned by Melissa Cross, NP   LABORATORY AND RADIOLOGIC DATA: Outside medical records were reviewed to synthesize the above history, along with the history and physical obtained during the visit.  Outside laboratory, pathology, and imaging reports were reviewed, with pertinent results below.  I personally reviewed the outside images.  WBC  Date Value Ref Range Status  08/29/2021 13.2 (H) 4.0 - 10.5 K/uL Final   Hemoglobin  Date Value Ref Range Status  08/29/2021 11.6 (L) 12.0 - 15.0 g/dL Final   Hemoglobin, fingerstick  Date Value Ref Range Status  05/16/2014 12.3 12.0 - 16.0 g/dL Final   HCT  Date Value Ref Range Status  08/29/2021 36.3 36.0 - 46.0 % Final   Platelets  Date Value Ref Range Status  08/29/2021 351 150 - 400 K/uL Final   Creat  Date Value Ref Range Status  09/13/2019 1.11 (H) 0.50 - 0.99 mg/dL Final    Comment:    For patients >49 years of age, the reference limit for Creatinine is approximately 13% higher for people identified as African-American. .    Creatinine, Ser  Date Value Ref Range Status  10/11/2022 1.01 0.40 - 1.20 mg/dL Final   AST  Date Value Ref  Range Status  07/30/2021 28 0 - 37 U/L Final   ALT  Date Value Ref Range Status  07/30/2021 22 0 - 35 U/L Final   CA 125  Date Value Ref Range Status  12/05/2022 26 <35 U/mL Final    Comment:    . This test was performed using the Siemens  Chemiluminescent method. Values obtained from different assay methods cannot be used  interchangeably. CA 125 levels, regardless of  value, should not be interpreted as absolute evidence of the presence or absence of disease. .    CEA  Date Value Ref Range Status  12/05/2022 4.4 (H) ng/mL Final    Comment:    Non-Smoker: <2.5 Smoker:     <5.0 . . This test was performed using the Siemens  chemiluminescent method. Values obtained from different assay methods cannot be used interchangeably. CEA levels, regardless of value, should not be interpreted as absolute evidence of the presence or absence of disease. .    FINAL MICROSCOPIC DIAGNOSIS:   A. ENDOMETRIUM, BIOPSY:  Scant atrophic endometrium and endometrial epithelium  Scant benign endocervical epithelium and metaplastic squamous epithelium  Negative   for atypia   MR PELVIS W WO CONTRAST 12/12/2022  Narrative CLINICAL DATA:  Left ovarian mass identified by prior ultrasound  EXAM: MRI ABDOMEN AND PELVIS WITHOUT AND WITH CONTRAST  TECHNIQUE: Multiplanar multisequence MR imaging of the abdomen and pelvis was performed both before and after the administration of intravenous contrast.  CONTRAST:  6 mL Vueway gadolinium contrast IV  COMPARISON:  Pelvic ultrasound, 12/05/2022  FINDINGS: COMBINED FINDINGS FOR BOTH MR ABDOMEN AND PELVIS  Lower chest: No acute abnormality.  Hepatobiliary: No solid liver abnormality is seen. No gallstones, gallbladder wall thickening, or biliary dilatation.  Pancreas: Unremarkable. No pancreatic ductal dilatation or surrounding inflammatory changes.  Spleen: Normal in size without significant abnormality.  Adrenals/Urinary Tract:  Adrenal glands are unremarkable. Kidneys are normal, without renal calculi, solid lesion, or hydronephrosis. Diverticulum of the left aspect of the urinary bladder (series 13, image 95).  Stomach/Bowel: Stomach is within normal limits. Appendix appears normal. Sigmoid diverticulosis. Wall thickening and inflammatory fat stranding about a prominent diverticulum of the distal sigmoid colon (series 3, image 19).  Vascular/Lymphatic: No significant vascular findings are present. No enlarged abdominal or pelvic lymph nodes.  Reproductive: Small volume of fluid in the endometrial cavity (series 5, image 13). Benign nabothian cyst of the cervix. Lobulated, somewhat heterogeneously appearing although predominantly T2 hypointense mass of the left ovary measuring 4.8 x 3.6 x 2.7 cm (series 3, image 19, image series 2, image 15). This is weakly contrast enhancing (series 13, image 88). Normal postmenopausal appearance of the right ovary.  Other: Small, fat containing left inguinal hernia.  No ascites.  Musculoskeletal: No acute or significant osseous findings. Status post right hip total arthroplasty with associated susceptibility artifact.  IMPRESSION: 1. Lobulated, predominantly T2 hypointense mass of the left ovary measuring 4.8 x 3.6 x 2.7 cm, which is weakly contrast enhancing. Imaging features suggest an ovarian fibroma or fibrothecoma. 2. Small volume fluid in the endometrial cavity, abnormal in the late postmenopausal setting. This may be due to estrogen secretion as ovarian tumors on the thecoma spectrum are frequently functional. There is furthermore a high association between thecoma spectrum ovarian tumors and concurrent endometrial malignancy. 3. Wall thickening and inflammatory fat stranding about a prominent diverticulum of the distal sigmoid colon, consistent with acute diverticulitis.  These results will be called to the ordering clinician or representative by the  Radiologist Assistant, and communication documented in the PACS or [facility] Dashboard.   Electronically Signed By: Alex D Bibbey M.D. On: 12/12/2022 16:27   US PELVIS TRANSVAGINAL NON-OB (TV ONLY) 12/05/2022  Narrative Indication:  left sided cervical/lower uterine segment mass noted on routine pelvic exam.  Findings: Pelvic US Uterus 4.42 x 2.98 x 2.44 cm. Peripheral calcifications noted. No myometrial masses. EMS 2.37 mm. Fluid noted in the endometrial canal. Left ovary 3.62 x 1.62 x 2.49 cm.  Ovary is echogenic with large vascular component. Right ovary 2.24 x 1.31 x 1.20 cm. 9 x 6 mm simple follicle noted. Trace free fluid.  Impression:  Vascular left ovarian mass.  Fluid in endometrial canal.  

## 2022-12-30 NOTE — Progress Notes (Signed)
Patient here for new patient consultation with Dr. Ernestina Patches and for a pre-operative appointment prior to her scheduled surgery on Feb 04, 2023. She is scheduled for robotic assisted laparoscopic bilateral salpingo-oophorectomy, possible staging if a precancer or cancer is seen including a robotic assisted total laparoscopic hysterectomy, possible laparotomy.  The surgery was discussed in detail.  See after visit summary for additional details. Visual aids used to discuss items related to surgery.      Discussed post-op pain management in detail including the aspects of the enhanced recovery pathway.  Advised her that a new prescription would be sent in for tramadol and it is only to be used for after her upcoming surgery.  We discussed the use of tylenol post-op and to monitor for a maximum of 4,000 mg in a 24 hour period.  Also prescribed sennakot to be used after surgery and to hold if having loose stools.  Discussed bowel regimen in detail.     Discussed the use of SCDs and measures to take at home to prevent DVT including frequent mobility.  Reportable signs and symptoms of DVT discussed. Post-operative instructions discussed and expectations for after surgery. Incisional care discussed as well including reportable signs and symptoms including erythema, drainage, wound separation.     10 minutes spent preparing information and with the patient.  Verbalizing understanding of material discussed. No needs or concerns voiced at the end of the visit.   Advised patient to call for any needs.  Advised that her post-operative medications had been prescribed and could be picked up at any time.    This appointment is included in the global surgical bundle as pre-operative teaching and has no charge.

## 2022-12-30 NOTE — Patient Instructions (Signed)
Preparing for your Surgery  Plan for surgery on Feb 04, 2023 with Dr. Bernadene Bell at [place] will be scheduled robotic assisted laparoscopic bilateral salpingo-oophorectomy (removal of both ovaries and fallopian tubes), possible staging if a precancer or cancer is seen including a robotic assisted total laparoscopic hysterectomy (removal of the uterus and cervix), possible laparotomy (larger incision on your abdomen if needed).    Pre-operative Testing -You will receive a phone call from presurgical testing at [facility] to arrange for a pre-operative appointment and lab work.  -Bring your insurance card, copy of an advanced directive if applicable, medication list  -At that visit, you will be asked to sign a consent for a possible blood transfusion in case a transfusion becomes necessary during surgery.  The need for a blood transfusion is rare but having consent is a necessary part of your care.     -You should not be taking blood thinners or aspirin at least ten days prior to surgery unless instructed by your surgeon.  -Do not take supplements such as fish oil (omega 3), red yeast rice, turmeric before your surgery. You want to avoid medications with aspirin in them including headache powders such as BC or Goody's), Excedrin migraine.  Day Before Surgery at [place] will be asked to take in a light diet the day before surgery. You will be advised you can have clear liquids up until 3 hours before your surgery.    Eat a light diet the day before surgery.  Examples including soups, broths, toast, yogurt, mashed potatoes.  AVOID GAS PRODUCING FOODS AND BEVERAGES. Things to avoid include carbonated beverages (fizzy beverages, sodas), raw fruits and raw vegetables (uncooked), or beans.   If your bowels are filled with gas, your surgeon will have difficulty visualizing your pelvic organs which increases your surgical risks.  Your role in recovery Your role is to  become active as soon as directed by your doctor, while still giving yourself time to heal.  Rest when you feel tired. You will be asked to do the following in order to speed your recovery:  - Cough and breathe deeply. This helps to clear and expand your lungs and can prevent pneumonia after surgery.  - [place]. Do mild physical activity. Walking or moving your legs help your circulation and body functions return to normal. Do not try to get up or walk alone the first time after surgery.   -If you develop swelling on one leg or the other, pain in the back of your leg, redness/warmth in one of your legs, please call the office or go to the Emergency Room to have a doppler to rule out a blood clot. For shortness of breath, chest pain-seek care in the Emergency Room as soon as possible. - Actively manage your pain. Managing your pain lets you move in comfort. We will ask you to rate your pain on a scale of zero to 10. It is your responsibility to tell your doctor or nurse where and how much you hurt so your pain can be treated.  Special Considerations -If you are diabetic, you may be placed on insulin after surgery to have closer control over your blood sugars to promote healing and recovery.  This does not mean that you will be discharged on insulin.  If applicable, your oral antidiabetics will be resumed when you are tolerating a solid diet.  -Your final pathology results from surgery should be available around  one week after surgery and the results will be relayed to you when available.  -Dr. Lahoma Crocker is the surgeon that assists your GYN Oncologist with surgery.  If you end up staying the night, the next day after your surgery you will either see Dr. Berline Lopes, Dr. Ernestina Patches, or Dr. Lahoma Crocker.  -FMLA forms can be faxed to [phone] and please allow 5-7 business days for completion.  Pain Management After Surgery -You have been prescribed your pain medication and  bowel regimen medications before surgery so that you can have these available when you are discharged from the hospital. The pain medication is for use ONLY AFTER surgery and a new prescription will not be given.   -Make sure that you have Tylenol and Ibuprofen IF YOU ARE ABLE TO TAKE THESE MEDICATIONS at home to use on a regular basis after surgery for pain control. We recommend alternating the medications every hour to six hours since they work differently and are processed in the body differently for pain relief.  -Review the attached handout on narcotic use and their risks and side effects.   Bowel Regimen -You have been prescribed Sennakot-S to take nightly to prevent constipation especially if you are taking the narcotic pain medication intermittently.  It is important to prevent constipation and drink adequate amounts of liquids. You can stop taking this medication when you are not taking pain medication and you are back on your normal bowel routine.  Risks of Surgery Risks of surgery are low but include bleeding, infection, damage to surrounding structures, re-operation, blood clots, and very rarely death.   Blood Transfusion Information (For the consent to be signed before surgery)  We will be checking your blood type before surgery so in case of emergencies, we will know what type of blood you would need.                                            WHAT IS A BLOOD TRANSFUSION?  A transfusion is the replacement of blood or some of its parts. Blood is made up of multiple cells which provide different functions. Red blood cells carry oxygen and are used for blood loss replacement. White blood cells fight against infection. Platelets control bleeding. Plasma helps clot blood. Other blood products are available for specialized needs, such as hemophilia or other clotting disorders. BEFORE THE TRANSFUSION  Who gives blood for transfusions?  You may be able to donate blood to be used at a  later date on yourself (autologous donation). Relatives can be asked to donate blood. This is generally not any safer than if you have received blood from a stranger. The same precautions are taken to ensure safety when a relative's blood is donated. Healthy volunteers who are fully evaluated to make sure their blood is safe. This is blood bank blood. Transfusion therapy is the safest it has ever been in the practice of medicine. Before blood is taken from a donor, a complete history is taken to make sure that person has no history of diseases nor engages in risky social behavior (examples are intravenous drug use or sexual activity with multiple partners). The donor's travel history is screened to minimize risk of transmitting infections, such as malaria. The donated blood is tested for signs of infectious diseases, such as HIV and hepatitis. The blood is then tested to be sure it is compatible  with you in order to minimize the chance of a transfusion reaction. If you or a relative donates blood, this is often done in anticipation of surgery and is not appropriate for emergency situations. It takes many days to process the donated blood. RISKS AND COMPLICATIONS Although transfusion therapy is very safe and saves many lives, the main dangers of transfusion include:  Getting an infectious disease. Developing a transfusion reaction. This is an allergic reaction to something in the blood you were given. Every precaution is taken to prevent this. The decision to have a blood transfusion has been considered carefully by your caregiver before blood is given. Blood is not given unless the benefits outweigh the risks.  AFTER SURGERY INSTRUCTIONS  Return to work: 4-6 weeks if applicable  Activity: 1. Be up and out of the bed during the day.  Take a nap if needed.  You may walk up steps but be careful and use the hand rail.  Stair climbing will tire you more than you think, you may need to stop part way and  rest.   2. No lifting or straining for 6 weeks over 10 pounds. No pushing, pulling, straining for 6 weeks.  3. No driving for around 1 week(s).  Do not drive if you are taking narcotic pain medicine and make sure that your reaction time has returned.   4. You can shower as soon as the next day after surgery. Shower daily.  Use your regular soap and water (not directly on the incision) and pat your incision(s) dry afterwards; don't rub.  No tub baths or submerging your body in water until cleared by your surgeon. If you have the soap that was given to you by pre-surgical testing that was used before surgery, you do not need to use it afterwards because this can irritate your incisions.   5. No sexual activity and nothing in the vagina for 4-6 weeks, 12 weeks if you have a hysterectomy (removal of the uterus and cervix).  6. You may experience a small amount of clear drainage from your incisions, which is normal.  If the drainage persists, increases, or changes color please call the office.  7. Do not use creams, lotions, or ointments such as neosporin on your incisions after surgery until advised by your surgeon because they can cause removal of the dermabond glue on your incisions.    8. You may experience vaginal spotting after surgery or around the 6-8 week mark from surgery when the stitches at the top of the vagina begin to dissolve (if you have a hysterectomy).  The spotting is normal but if you experience heavy bleeding, call our office.  9. Take Tylenol or ibuprofen first for pain if you are able to take these medications and only use narcotic pain medication for severe pain not relieved by the Tylenol or Ibuprofen.  Monitor your Tylenol intake to a max of 4,000 mg in a 24 hour period. You can alternate these medications after surgery.  Diet: 1. Low sodium Heart Healthy Diet is recommended but you are cleared to resume your normal (before surgery) diet after your procedure.  2. It is safe  to use a laxative, such as Miralax or Colace, if you have difficulty moving your bowels. You have been prescribed Sennakot-S to take at bedtime every evening after surgery to keep bowel movements regular and to prevent constipation.    Wound Care: 1. Keep clean and dry.  Shower daily.  Reasons to call the Doctor: Fever -  Oral temperature greater than 100.4 degrees Fahrenheit Foul-smelling vaginal discharge Difficulty urinating Nausea and vomiting Increased pain at the site of the incision that is unrelieved with pain medicine. Difficulty breathing with or without chest pain New calf pain especially if only on one side Sudden, continuing increased vaginal bleeding with or without clots.   Contacts: For questions or concerns you should contact:  Dr. Bernadene Bell at [place], NP at [phone]  After Hours: call [phone] and have the GYN Oncologist paged/contacted (after 5 pm or on the weekends). You will speak with an after hours RN and let he or she know you have had surgery.  Messages sent via mychart are for non-urgent matters and are not responded to after hours so for urgent needs, please call the after hours number.

## 2022-12-30 NOTE — Patient Instructions (Signed)
Preparing for your Surgery   Plan for surgery on Feb 04, 2023 with Dr. Bernadene Bell at [place] will be scheduled robotic assisted laparoscopic bilateral salpingo-oophorectomy (removal of both ovaries and fallopian tubes), possible staging if a precancer or cancer is seen including a robotic assisted total laparoscopic hysterectomy (removal of the uterus and cervix), possible laparotomy (larger incision on your abdomen if needed).     Pre-operative Testing -You will receive a phone call from presurgical testing at [facility] to arrange for a pre-operative appointment and lab work.   -Bring your insurance card, copy of an advanced directive if applicable, medication list   -At that visit, you will be asked to sign a consent for a possible blood transfusion in case a transfusion becomes necessary during surgery.  The need for a blood transfusion is rare but having consent is a necessary part of your care.      -You should not be taking blood thinners or aspirin at least ten days prior to surgery unless instructed by your surgeon.   -Do not take supplements such as fish oil (omega 3), red yeast rice, turmeric before your surgery. You want to avoid medications with aspirin in them including headache powders such as BC or Goody's), Excedrin migraine.   Day Before Surgery at [place] will be asked to take in a light diet the day before surgery. You will be advised you can have clear liquids up until 3 hours before your surgery.     Eat a light diet the day before surgery.  Examples including soups, broths, toast, yogurt, mashed potatoes.  AVOID GAS PRODUCING FOODS AND BEVERAGES. Things to avoid include carbonated beverages (fizzy beverages, sodas), raw fruits and raw vegetables (uncooked), or beans.    If your bowels are filled with gas, your surgeon will have difficulty visualizing your pelvic organs which increases your surgical risks.   Your role in recovery Your  role is to become active as soon as directed by your doctor, while still giving yourself time to heal.  Rest when you feel tired. You will be asked to do the following in order to speed your recovery:   - Cough and breathe deeply. This helps to clear and expand your lungs and can prevent pneumonia after surgery.  - [place]. Do mild physical activity. Walking or moving your legs help your circulation and body functions return to normal. Do not try to get up or walk alone the first time after surgery.   -If you develop swelling on one leg or the other, pain in the back of your leg, redness/warmth in one of your legs, please call the office or go to the Emergency Room to have a doppler to rule out a blood clot. For shortness of breath, chest pain-seek care in the Emergency Room as soon as possible. - Actively manage your pain. Managing your pain lets you move in comfort. We will ask you to rate your pain on a scale of zero to 10. It is your responsibility to tell your doctor or nurse where and how much you hurt so your pain can be treated.   Special Considerations -If you are diabetic, you may be placed on insulin after surgery to have closer control over your blood sugars to promote healing and recovery.  This does not mean that you will be discharged on insulin.  If applicable, your oral antidiabetics will be resumed when you are tolerating a solid  diet.   -Your final pathology results from surgery should be available around one week after surgery and the results will be relayed to you when available.   -Dr. Lahoma Crocker is the surgeon that assists your GYN Oncologist with surgery.  If you end up staying the night, the next day after your surgery you will either see Dr. Berline Lopes, Dr. Ernestina Patches, or Dr. Lahoma Crocker.   -FMLA forms can be faxed to [phone] and please allow 5-7 business days for completion.   Pain Management After Surgery -You have been prescribed your  pain medication and bowel regimen medications before surgery so that you can have these available when you are discharged from the hospital. The pain medication is for use ONLY AFTER surgery and a new prescription will not be given.    -Make sure that you have Tylenol and Ibuprofen IF YOU ARE ABLE TO TAKE THESE MEDICATIONS at home to use on a regular basis after surgery for pain control. We recommend alternating the medications every hour to six hours since they work differently and are processed in the body differently for pain relief.   -Review the attached handout on narcotic use and their risks and side effects.    Bowel Regimen -You have been prescribed Sennakot-S to take nightly to prevent constipation especially if you are taking the narcotic pain medication intermittently.  It is important to prevent constipation and drink adequate amounts of liquids. You can stop taking this medication when you are not taking pain medication and you are back on your normal bowel routine.   Risks of Surgery Risks of surgery are low but include bleeding, infection, damage to surrounding structures, re-operation, blood clots, and very rarely death.     Blood Transfusion Information (For the consent to be signed before surgery)   We will be checking your blood type before surgery so in case of emergencies, we will know what type of blood you would need.                                             WHAT IS A BLOOD TRANSFUSION?   A transfusion is the replacement of blood or some of its parts. Blood is made up of multiple cells which provide different functions. Red blood cells carry oxygen and are used for blood loss replacement. White blood cells fight against infection. Platelets control bleeding. Plasma helps clot blood. Other blood products are available for specialized needs, such as hemophilia or other clotting disorders. BEFORE THE TRANSFUSION  Who gives blood for transfusions?  You may be able to  donate blood to be used at a later date on yourself (autologous donation). Relatives can be asked to donate blood. This is generally not any safer than if you have received blood from a stranger. The same precautions are taken to ensure safety when a relative's blood is donated. Healthy volunteers who are fully evaluated to make sure their blood is safe. This is blood bank blood. Transfusion therapy is the safest it has ever been in the practice of medicine. Before blood is taken from a donor, a complete history is taken to make sure that person has no history of diseases nor engages in risky social behavior (examples are intravenous drug use or sexual activity with multiple partners). The donor's travel history is screened to minimize risk of transmitting infections, such as malaria. The  donated blood is tested for signs of infectious diseases, such as HIV and hepatitis. The blood is then tested to be sure it is compatible with you in order to minimize the chance of a transfusion reaction. If you or a relative donates blood, this is often done in anticipation of surgery and is not appropriate for emergency situations. It takes many days to process the donated blood. RISKS AND COMPLICATIONS Although transfusion therapy is very safe and saves many lives, the main dangers of transfusion include:  Getting an infectious disease. Developing a transfusion reaction. This is an allergic reaction to something in the blood you were given. Every precaution is taken to prevent this. The decision to have a blood transfusion has been considered carefully by your caregiver before blood is given. Blood is not given unless the benefits outweigh the risks.   AFTER SURGERY INSTRUCTIONS   Return to work: 4-6 weeks if applicable   Activity: 1. Be up and out of the bed during the day.  Take a nap if needed.  You may walk up steps but be careful and use the hand rail.  Stair climbing will tire you more than you think, you  may need to stop part way and rest.    2. No lifting or straining for 6 weeks over 10 pounds. No pushing, pulling, straining for 6 weeks.   3. No driving for around 1 week(s).  Do not drive if you are taking narcotic pain medicine and make sure that your reaction time has returned.    4. You can shower as soon as the next day after surgery. Shower daily.  Use your regular soap and water (not directly on the incision) and pat your incision(s) dry afterwards; don't rub.  No tub baths or submerging your body in water until cleared by your surgeon. If you have the soap that was given to you by pre-surgical testing that was used before surgery, you do not need to use it afterwards because this can irritate your incisions.    5. No sexual activity and nothing in the vagina for 4-6 weeks, 12 weeks if you have a hysterectomy (removal of the uterus and cervix).   6. You may experience a small amount of clear drainage from your incisions, which is normal.  If the drainage persists, increases, or changes color please call the office.   7. Do not use creams, lotions, or ointments such as neosporin on your incisions after surgery until advised by your surgeon because they can cause removal of the dermabond glue on your incisions.     8. You may experience vaginal spotting after surgery or around the 6-8 week mark from surgery when the stitches at the top of the vagina begin to dissolve (if you have a hysterectomy).  The spotting is normal but if you experience heavy bleeding, call our office.   9. Take Tylenol or ibuprofen first for pain if you are able to take these medications and only use narcotic pain medication for severe pain not relieved by the Tylenol or Ibuprofen.  Monitor your Tylenol intake to a max of 4,000 mg in a 24 hour period. You can alternate these medications after surgery.   Diet: 1. Low sodium Heart Healthy Diet is recommended but you are cleared to resume your normal (before surgery) diet  after your procedure.   2. It is safe to use a laxative, such as Miralax or Colace, if you have difficulty moving your bowels. You have been prescribed  Sennakot-S to take at bedtime every evening after surgery to keep bowel movements regular and to prevent constipation.     Wound Care: 1. Keep clean and dry.  Shower daily.   Reasons to call the Doctor: Fever - Oral temperature greater than 100.4 degrees Fahrenheit Foul-smelling vaginal discharge Difficulty urinating Nausea and vomiting Increased pain at the site of the incision that is unrelieved with pain medicine. Difficulty breathing with or without chest pain New calf pain especially if only on one side Sudden, continuing increased vaginal bleeding with or without clots.   Contacts: For questions or concerns you should contact:   Dr. Bernadene Bell at [place], NP at [phone]   After Hours: call [phone] and have the GYN Oncologist paged/contacted (after 5 pm or on the weekends). You will speak with an after hours RN and let he or she know you have had surgery.   Messages sent via mychart are for non-urgent matters and are not responded to after hours so for urgent needs, please call the after hours number.

## 2022-12-31 ENCOUNTER — Other Ambulatory Visit: Payer: Self-pay | Admitting: Gynecologic Oncology

## 2022-12-31 ENCOUNTER — Encounter: Payer: Self-pay | Admitting: Gynecologic Oncology

## 2022-12-31 DIAGNOSIS — R1900 Intra-abdominal and pelvic swelling, mass and lump, unspecified site: Secondary | ICD-10-CM

## 2022-12-31 MED ORDER — ERYTHROMYCIN BASE 500 MG PO TABS
ORAL_TABLET | ORAL | 0 refills | Status: DC
Start: 2023-02-03 — End: 2023-02-07

## 2022-12-31 MED ORDER — NEOMYCIN SULFATE 500 MG PO TABS
ORAL_TABLET | ORAL | 0 refills | Status: DC
Start: 2023-02-03 — End: 2023-02-07

## 2022-12-31 NOTE — Progress Notes (Signed)
Abxs for preop bowel prep prescribed.

## 2023-01-01 ENCOUNTER — Encounter: Payer: Self-pay | Admitting: Psychiatry

## 2023-01-01 LAB — INHIBIN B: Inhibin B: <7 pg/mL (ref 0.0–16.9)

## 2023-01-02 ENCOUNTER — Encounter: Payer: Self-pay | Admitting: Family Medicine

## 2023-01-03 ENCOUNTER — Other Ambulatory Visit: Payer: Self-pay | Admitting: Family Medicine

## 2023-01-03 DIAGNOSIS — B3731 Acute candidiasis of vulva and vagina: Secondary | ICD-10-CM

## 2023-01-03 MED ORDER — FLUCONAZOLE 150 MG PO TABS
150.0000 mg | ORAL_TABLET | ORAL | 0 refills | Status: AC
Start: 2023-01-03 — End: 2023-01-11

## 2023-01-07 LAB — INHIBIN A: Inhibin-A: 1.9 pg/mL

## 2023-01-22 ENCOUNTER — Encounter: Payer: Self-pay | Admitting: Psychiatry

## 2023-01-22 NOTE — Patient Instructions (Signed)
SURGICAL WAITING ROOM VISITATION  Patients having surgery or a procedure may have no more than 2 support people in the waiting area - these visitors may rotate.    Children under the age of 26 must have an adult with them who is not the patient.  Due to an increase in RSV and influenza rates and associated hospitalizations, children ages 34 and under may not visit patients in [facility] hospitals.  If the patient needs to stay at the hospital during part of their recovery, the visitor guidelines for inpatient rooms apply. Pre-op nurse will coordinate an appropriate time for 1 support person to accompany patient in pre-op.  This support person may not rotate.    Please refer to the [facility] website for the visitor guidelines for Inpatients (after your surgery is over and you are in a regular room).       Your procedure is scheduled on:  02/04/23    Report to [facility] Main Entrance    Report to admitting at   0700AM   Call this number if you have problems the morning of surgery [phone]   Do not eat food :After Midnight.   After Midnight you may have the following liquids until __ 0600____ AM DAY OF SURGERY  Water Non-Citrus Juices (without pulp, NO RED-Apple, White grape, White cranberry) Black Coffee (NO MILK/CREAM OR CREAMERS, sugar ok)  Clear Tea (NO MILK/CREAM OR CREAMERS, sugar ok) regular and decaf                             Plain Jell-O (NO RED)                                           Fruit ices (not with fruit pulp, NO RED)                                     Popsicles (NO RED)                                                               Sports drinks like Gatorade (NO RED)              Drink 2 Ensure/G2 drinks AT 10:00 PM the night before surgery.        The day of surgery:  Drink ONE (1) Pre-Surgery Clear Ensure or G2 at   0600AM ( have completed by ) the morning of surgery. Drink in one sitting. Do not sip.  This drink was given to you during  your hospital  pre-op appointment visit. Nothing else to drink after completing the  Pre-Surgery Clear Ensure or G2.          If you have questions, please contact your surgeon's office.       Oral Hygiene is also important to reduce your risk of infection.  Remember - BRUSH YOUR TEETH THE MORNING OF SURGERY WITH YOUR REGULAR TOOTHPASTE  DENTURES WILL BE REMOVED PRIOR TO SURGERY PLEASE DO NOT APPLY "Poly grip" OR ADHESIVES!!!   Do NOT smoke after Midnight   Take these medicines the morning of surgery with A SIP OF WATER:  wellbutrin, prozac, omeprazole   DO NOT TAKE ANY ORAL DIABETIC MEDICATIONS DAY OF YOUR SURGERY  Bring CPAP mask and tubing day of surgery.                              You may not have any metal on your body including hair pins, jewelry, and body piercing             Do not wear make-up, lotions, powders, perfumes/cologne, or deodorant  Do not wear nail polish including gel and S&S, artificial/acrylic nails, or any other type of covering on natural nails including finger and toenails. If you have artificial nails, gel coating, etc. that needs to be removed by a nail salon please have this removed prior to surgery or surgery may need to be canceled/ delayed if the surgeon/ anesthesia feels like they are unable to be safely monitored.   Do not shave  48 hours prior to surgery.               Men may shave face and neck.   Do not bring valuables to the hospital. [facility] IS NOT             RESPONSIBLE   FOR VALUABLES.   Contacts, glasses, dentures or bridgework may not be worn into surgery.   Bring small overnight bag day of surgery.   DO NOT BRING YOUR HOME MEDICATIONS TO THE HOSPITAL. PHARMACY WILL DISPENSE MEDICATIONS LISTED ON YOUR MEDICATION LIST TO YOU DURING YOUR ADMISSION IN THE HOSPITAL!    Patients discharged on the day of surgery will not be allowed to drive home.  Someone NEEDS to stay with you for the first 24  hours after anesthesia.   Special Instructions: Bring a copy of your healthcare power of attorney and living will documents the day of surgery if you haven't scanned them before.              Please read over the following fact sheets you were given: IF YOU HAVE QUESTIONS ABOUT YOUR PRE-OP INSTRUCTIONS PLEASE CALL [phone]   If you received a COVID test during your pre-op visit  it is requested that you wear a mask when out in public, stay away from anyone that may not be feeling well and notify your surgeon if you develop symptoms. If you test positive for Covid or have been in contact with anyone that has tested positive in the last 10 days please notify you surgeon.    [facility] - Preparing for Surgery Before surgery, you can play an important role.  Because skin is not sterile, your skin needs to be as free of germs as possible.  You can reduce the number of germs on your skin by washing with CHG (chlorahexidine gluconate) soap before surgery.  CHG is an antiseptic cleaner which kills germs and bonds with the skin to continue killing germs even after washing. Please DO NOT use if you have an allergy to CHG or antibacterial soaps.  If your skin becomes reddened/irritated stop using the CHG and inform your nurse when you arrive at Short Stay. Do not shave (including legs and  underarms) for at least 48 hours prior to the first CHG shower.  You may shave your face/neck. Please follow these instructions carefully:  1.  Shower with CHG Soap the night before surgery and the  morning of Surgery.  2.  If you choose to wash your hair, wash your hair first as usual with your  normal  shampoo.  3.  After you shampoo, rinse your hair and body thoroughly to remove the  shampoo.                           4.  Use CHG as you would any other liquid soap.  You can apply chg directly  to the skin and wash                       Gently with a scrungie or clean washcloth.  5.  Apply the CHG Soap to your body  ONLY FROM THE NECK DOWN.   Do not use on face/ open                           Wound or open sores. Avoid contact with eyes, ears mouth and genitals (private parts).                       Wash face,  Genitals (private parts) with your normal soap.             6.  Wash thoroughly, paying special attention to the area where your surgery  will be performed.  7.  Thoroughly rinse your body with warm water from the neck down.  8.  DO NOT shower/wash with your normal soap after using and rinsing off  the CHG Soap.                9.  Pat yourself dry with a clean towel.            10.  Wear clean pajamas.            11.  Place clean sheets on your bed the night of your first shower and do not  sleep with pets. Day of Surgery : Do not apply any lotions/deodorants the morning of surgery.  Please wear clean clothes to the hospital/surgery center.  FAILURE TO FOLLOW THESE INSTRUCTIONS MAY RESULT IN THE CANCELLATION OF YOUR SURGERY PATIENT SIGNATURE_________________________________  NURSE SIGNATURE__________________________________  ________________________________________________________________________

## 2023-01-22 NOTE — Progress Notes (Signed)
Anesthesia Review:  PCP: Jessica Casey LOV 12/20/22  Cardiologist : Chest x-ray : EKG : Echo : 2017 test: Cardiac Cath :  Activity level:  Sleep Study/ CPAP : Fasting Blood Sugar :      / Checks Blood Sugar -- times a day:   Blood Thinner/ Instructions /Last Dose: ASA / Instructions/ Last Dose :

## 2023-01-23 ENCOUNTER — Encounter (HOSPITAL_COMMUNITY)
Admission: RE | Admit: 2023-01-23 | Discharge: 2023-01-23 | Disposition: A | Discharge status: Home / Self Care | Payer: Medicare PPO | Source: Ambulatory Visit | Attending: Psychiatry | Admitting: Psychiatry

## 2023-01-23 ENCOUNTER — Telehealth: Payer: Self-pay

## 2023-01-23 ENCOUNTER — Other Ambulatory Visit: Payer: Self-pay

## 2023-01-23 ENCOUNTER — Encounter (HOSPITAL_COMMUNITY): Payer: Self-pay

## 2023-01-23 DIAGNOSIS — R1900 Intra-abdominal and pelvic swelling, mass and lump, unspecified site: Secondary | ICD-10-CM | POA: Diagnosis not present

## 2023-01-23 DIAGNOSIS — Z01818 Encounter for other preprocedural examination: Secondary | ICD-10-CM | POA: Diagnosis not present

## 2023-01-23 LAB — COMPREHENSIVE METABOLIC PANEL
ALT: 14 U/L (ref 0–44)
AST: 18 U/L (ref 15–41)
Albumin: 3.6 g/dL (ref 3.5–5.0)
Alkaline Phosphatase: 55 U/L (ref 38–126)
Anion gap: 7 (ref 5–15)
BUN: 19 mg/dL (ref 8–23)
CO2: 28 mmol/L (ref 22–32)
Calcium: 9.1 mg/dL (ref 8.9–10.3)
Chloride: 105 mmol/L (ref 98–111)
Creatinine, Ser: 0.94 mg/dL (ref 0.44–1.00)
GFR, Estimated: >60 mL/min (ref >60)
Glucose, Bld: 96 mg/dL (ref 70–99)
Potassium: 3.9 mmol/L (ref 3.5–5.1)
Sodium: 140 mmol/L (ref 135–145)
Total Bilirubin: 0.5 mg/dL (ref 0.3–1.2)
Total Protein: 6.9 g/dL (ref 6.5–8.1)

## 2023-01-23 LAB — CBC
HCT: 35.6 % — ABNORMAL LOW (ref 36.0–46.0)
Hemoglobin: 11.3 g/dL — ABNORMAL LOW (ref 12.0–15.0)
MCH: 32.8 pg (ref 26.0–34.0)
MCHC: 31.7 g/dL (ref 30.0–36.0)
MCV: 103.2 fL — ABNORMAL HIGH (ref 80.0–100.0)
Platelets: 230 10*3/uL (ref 150–400)
RBC: 3.45 MIL/uL — ABNORMAL LOW (ref 3.87–5.11)
RDW: 14.1 % (ref 11.5–15.5)
WBC: 11.9 10*3/uL — ABNORMAL HIGH (ref 4.0–10.5)
nRBC: 0 % (ref 0.0–0.2)

## 2023-01-23 LAB — TYPE AND SCREEN
ABO/RH(D): A POS
Antibody Screen: NEGATIVE

## 2023-01-23 NOTE — Telephone Encounter (Signed)
Told Ms Peer that Dr. Alvester Morin had a surgery cancellation on 01-28-23. Warner Mccreedy, NP  wanted to know if she was interested in moving her surgery from 02-04-23 to 01-28-23. Pt stated that she would move her surgery up to 01-28-23.  Told Ms Sano that she will be notified by presurgical testing of the time changes after the case is posted for 01-28-23. Pt verbalized understanding. Informed Warner Mccreedy, NP of the above information.

## 2023-01-27 ENCOUNTER — Other Ambulatory Visit: Payer: Self-pay | Admitting: Gynecologic Oncology

## 2023-01-27 ENCOUNTER — Telehealth: Payer: Self-pay | Admitting: Surgery

## 2023-01-27 NOTE — Telephone Encounter (Signed)
Called patient to check on status of bowel prep. Patient states she was able to take Dulcolax at 9:45am and took Miralax at 11:30am. Patient voiced no concerns at this time and verbalized intent to take antibiotics at 2pm, 3pm, and 10pm. Advised patient to call our office with any other concerns.

## 2023-01-27 NOTE — Telephone Encounter (Addendum)
Telephone call to check on pre-operative status.  Patient compliant with pre-operative instructions.  Reinforced nothing to eat after midnight. Clear liquids until 10:30am. Patient to arrive at 11:30am. Verified that post-op medications have been sent to patient's preferred pharmacy.    Reviewed bowel prep instructions in detail with patient. At the time of call, patient had not started bowel prep but did have antibiotic prescriptions filled. Advised patient to pick up dulcolax and miralax as soon as call ended and start dulcolax as soon as she has it. Advised patient to start Miralax at 11am regardless of the time she starts dulcolax. Patient able to pull up bowel prep instructions in mychart during the call and follow again. She verbalized understanding of all instructions and had no questions at this time. Patient advised that our clinic will follow up with her again this afternoon to ensure she has no issues with bowel prep. No other concerns at this time.

## 2023-01-27 NOTE — Progress Notes (Signed)
Restickered chart since surgery moved from 02/04/23 until 01/28/23.  LVMM for pt to call [phone] to make sure pt aware of date change for surgery.

## 2023-01-27 NOTE — Progress Notes (Signed)
PST nurse called pt and LVMM in regards to date and time change.  PT called back and stated she is aware to arrive at 1130am and surgery at 1330pm.  PT is currently doing bowel prep instrucitons as given to her by OB/GYN office with dulcolax, Miralax and gatorade , etc.  Pt aware to complete 2 ensure presurgery drinks at 1000 pm tonite and one in am at 1030am.  Pt  voiced uinderstanding.  Chart has been restickered with new date stickers.

## 2023-01-28 ENCOUNTER — Encounter (HOSPITAL_COMMUNITY)
Admission: RE | Disposition: A | Discharge status: Home / Self Care | Payer: Self-pay | Source: Ambulatory Visit | Attending: Psychiatry

## 2023-01-28 ENCOUNTER — Ambulatory Visit (HOSPITAL_COMMUNITY): Payer: Medicare PPO | Admitting: Certified Registered Nurse Anesthetist

## 2023-01-28 ENCOUNTER — Ambulatory Visit (HOSPITAL_BASED_OUTPATIENT_CLINIC_OR_DEPARTMENT_OTHER): Payer: Medicare PPO | Admitting: Certified Registered Nurse Anesthetist

## 2023-01-28 ENCOUNTER — Ambulatory Visit (HOSPITAL_COMMUNITY)
Admission: RE | Admit: 2023-01-28 | Discharge: 2023-01-28 | Disposition: A | Discharge status: Home / Self Care | Payer: Medicare PPO | Source: Ambulatory Visit | Attending: Psychiatry | Admitting: Psychiatry

## 2023-01-28 ENCOUNTER — Other Ambulatory Visit: Payer: Self-pay

## 2023-01-28 ENCOUNTER — Encounter (HOSPITAL_COMMUNITY): Payer: Self-pay | Admitting: Psychiatry

## 2023-01-28 DIAGNOSIS — D270 Benign neoplasm of right ovary: Secondary | ICD-10-CM | POA: Insufficient documentation

## 2023-01-28 DIAGNOSIS — K5792 Diverticulitis of intestine, part unspecified, without perforation or abscess without bleeding: Secondary | ICD-10-CM | POA: Diagnosis not present

## 2023-01-28 DIAGNOSIS — K219 Gastro-esophageal reflux disease without esophagitis: Secondary | ICD-10-CM | POA: Insufficient documentation

## 2023-01-28 DIAGNOSIS — N838 Other noninflammatory disorders of ovary, fallopian tube and broad ligament: Secondary | ICD-10-CM | POA: Diagnosis not present

## 2023-01-28 DIAGNOSIS — N888 Other specified noninflammatory disorders of cervix uteri: Secondary | ICD-10-CM | POA: Diagnosis not present

## 2023-01-28 DIAGNOSIS — F32A Depression, unspecified: Secondary | ICD-10-CM | POA: Diagnosis not present

## 2023-01-28 DIAGNOSIS — D259 Leiomyoma of uterus, unspecified: Secondary | ICD-10-CM | POA: Diagnosis not present

## 2023-01-28 DIAGNOSIS — R1900 Intra-abdominal and pelvic swelling, mass and lump, unspecified site: Secondary | ICD-10-CM | POA: Diagnosis not present

## 2023-01-28 DIAGNOSIS — G4730 Sleep apnea, unspecified: Secondary | ICD-10-CM

## 2023-01-28 DIAGNOSIS — Z792 Long term (current) use of antibiotics: Secondary | ICD-10-CM | POA: Diagnosis not present

## 2023-01-28 DIAGNOSIS — D271 Benign neoplasm of left ovary: Secondary | ICD-10-CM | POA: Diagnosis not present

## 2023-01-28 DIAGNOSIS — D219 Benign neoplasm of connective and other soft tissue, unspecified: Secondary | ICD-10-CM | POA: Diagnosis not present

## 2023-01-28 HISTORY — PX: ROBOTIC ASSISTED TOTAL HYSTERECTOMY WITH BILATERAL SALPINGO OOPHERECTOMY: SHX6086

## 2023-01-28 SURGERY — HYSTERECTOMY, TOTAL, ROBOT-ASSISTED, LAPAROSCOPIC, WITH BILATERAL SALPINGO-OOPHORECTOMY
Anesthesia: General | Site: Abdomen | Laterality: Bilateral

## 2023-01-28 MED ORDER — PROPOFOL 10 MG/ML IV BOLUS
INTRAVENOUS | Status: AC
  Filled 2023-01-28: qty 20

## 2023-01-28 MED ORDER — FENTANYL CITRATE (PF) 100 MCG/2ML IJ SOLN
INTRAMUSCULAR | Status: AC
  Filled 2023-01-28: qty 2

## 2023-01-28 MED ORDER — HEPARIN SODIUM (PORCINE) 5000 UNIT/ML IJ SOLN
5000.0000 [IU] | INTRAMUSCULAR | Status: AC
  Administered 2023-01-28: 5000 [IU] via SUBCUTANEOUS
  Filled 2023-01-28: qty 1

## 2023-01-28 MED ORDER — ACETAMINOPHEN 10 MG/ML IV SOLN: 1000.0000 mg | Freq: Once | INTRAVENOUS | Status: DC | PRN

## 2023-01-28 MED ORDER — OXYCODONE HCL 5 MG/5ML PO SOLN: 5.0000 mg | Freq: Once | ORAL | Status: AC | PRN

## 2023-01-28 MED ORDER — SUGAMMADEX SODIUM 500 MG/5ML IV SOLN
INTRAVENOUS | Status: DC | PRN
  Administered 2023-01-28: 150 mg via INTRAVENOUS

## 2023-01-28 MED ORDER — CEFAZOLIN SODIUM-DEXTROSE 2-4 GM/100ML-% IV SOLN
2.0000 g | INTRAVENOUS | Status: AC
  Administered 2023-01-28: 2 g via INTRAVENOUS
  Filled 2023-01-28: qty 100

## 2023-01-28 MED ORDER — LACTATED RINGERS IV SOLN: INTRAVENOUS | Status: DC

## 2023-01-28 MED ORDER — FENTANYL CITRATE PF 50 MCG/ML IJ SOSY: 25.0000 ug | PREFILLED_SYRINGE | INTRAMUSCULAR | Status: DC | PRN

## 2023-01-28 MED ORDER — BUPIVACAINE HCL 0.25 % IJ SOLN
INTRAMUSCULAR | Status: AC
  Filled 2023-01-28: qty 1

## 2023-01-28 MED ORDER — ARTIFICIAL TEARS OPHTHALMIC OINT
TOPICAL_OINTMENT | OPHTHALMIC | Status: AC
  Filled 2023-01-28: qty 3.5

## 2023-01-28 MED ORDER — PHENYLEPHRINE HCL-NACL 20-0.9 MG/250ML-% IV SOLN
INTRAVENOUS | Status: DC | PRN
  Administered 2023-01-28: 25 ug/min via INTRAVENOUS

## 2023-01-28 MED ORDER — OXYCODONE HCL 5 MG PO TABS
ORAL_TABLET | ORAL | Status: AC
  Filled 2023-01-28: qty 1

## 2023-01-28 MED ORDER — DEXAMETHASONE SODIUM PHOSPHATE 4 MG/ML IJ SOLN
4.0000 mg | INTRAMUSCULAR | Status: AC
  Administered 2023-01-28: 4 mg via INTRAVENOUS

## 2023-01-28 MED ORDER — OXYCODONE HCL 5 MG PO TABS
5.0000 mg | ORAL_TABLET | Freq: Once | ORAL | Status: AC | PRN
  Administered 2023-01-28: 5 mg via ORAL

## 2023-01-28 MED ORDER — ACETAMINOPHEN 160 MG/5ML PO SOLN: 325.0000 mg | ORAL | Status: DC | PRN

## 2023-01-28 MED ORDER — PROMETHAZINE HCL 25 MG/ML IJ SOLN: 6.2500 mg | INTRAMUSCULAR | Status: DC | PRN

## 2023-01-28 MED ORDER — ENSURE PRE-SURGERY PO LIQD
296.0000 mL | Freq: Once | ORAL | Status: DC
  Filled 2023-01-28: qty 296

## 2023-01-28 MED ORDER — ACETAMINOPHEN 500 MG PO TABS
1000.0000 mg | ORAL_TABLET | ORAL | Status: AC
  Administered 2023-01-28: 1000 mg via ORAL
  Filled 2023-01-28: qty 2

## 2023-01-28 MED ORDER — LACTATED RINGERS IV SOLN: INTRAVENOUS | Status: DC | PRN

## 2023-01-28 MED ORDER — LACTATED RINGERS IR SOLN
Status: DC | PRN
  Administered 2023-01-28: 1000 mL

## 2023-01-28 MED ORDER — AMISULPRIDE (ANTIEMETIC) 5 MG/2ML IV SOLN: 10.0000 mg | Freq: Once | INTRAVENOUS | Status: DC | PRN

## 2023-01-28 MED ORDER — LIDOCAINE 2% (20 MG/ML) 5 ML SYRINGE
INTRAMUSCULAR | Status: DC | PRN
  Administered 2023-01-28: 50 mg via INTRAVENOUS
  Administered 2023-01-28: 1.5 mg/kg/h via INTRAVENOUS

## 2023-01-28 MED ORDER — ACETAMINOPHEN 325 MG PO TABS
325.0000 mg | ORAL_TABLET | ORAL | Status: DC | PRN
  Administered 2023-01-28: 650 mg via ORAL

## 2023-01-28 MED ORDER — ENSURE PRE-SURGERY PO LIQD
592.0000 mL | Freq: Once | ORAL | Status: DC
  Filled 2023-01-28: qty 592

## 2023-01-28 MED ORDER — ONDANSETRON HCL 4 MG/2ML IJ SOLN
INTRAMUSCULAR | Status: DC | PRN
  Administered 2023-01-28: 4 mg via INTRAVENOUS

## 2023-01-28 MED ORDER — FENTANYL CITRATE (PF) 100 MCG/2ML IJ SOLN
INTRAMUSCULAR | Status: DC | PRN
  Administered 2023-01-28 (×3): 50 ug via INTRAVENOUS

## 2023-01-28 MED ORDER — PROPOFOL 10 MG/ML IV BOLUS
INTRAVENOUS | Status: DC | PRN
  Administered 2023-01-28: 50 mg via INTRAVENOUS
  Administered 2023-01-28: 100 mg via INTRAVENOUS

## 2023-01-28 MED ORDER — BUPIVACAINE HCL 0.25 % IJ SOLN
INTRAMUSCULAR | Status: DC | PRN
  Administered 2023-01-28: 20 mL

## 2023-01-28 MED ORDER — ACETAMINOPHEN 325 MG PO TABS
ORAL_TABLET | ORAL | Status: AC
  Filled 2023-01-28: qty 2

## 2023-01-28 MED ORDER — PHENYLEPHRINE 80 MCG/ML (10ML) SYRINGE FOR IV PUSH (FOR BLOOD PRESSURE SUPPORT)
PREFILLED_SYRINGE | INTRAVENOUS | Status: DC | PRN
  Administered 2023-01-28: 80 ug via INTRAVENOUS

## 2023-01-28 MED ORDER — STERILE WATER FOR IRRIGATION IR SOLN
Status: DC | PRN
  Administered 2023-01-28: 1000 mL

## 2023-01-28 MED ORDER — ROCURONIUM BROMIDE 10 MG/ML (PF) SYRINGE
PREFILLED_SYRINGE | INTRAVENOUS | Status: DC | PRN
  Administered 2023-01-28: 60 mg via INTRAVENOUS

## 2023-01-28 SURGICAL SUPPLY — 81 items
ADH SKN CLS APL DERMABOND .7 (GAUZE/BANDAGES/DRESSINGS) ×1
AGENT HMST KT MTR STRL THRMB (HEMOSTASIS)
APL ESCP 34 STRL LF DISP (HEMOSTASIS)
APPLICATOR SURGIFLO ENDO (HEMOSTASIS) IMPLANT
BAG LAPAROSCOPIC 12 15 PORT 16 (BASKET) IMPLANT
BAG RETRIEVAL 12/15 (BASKET)
BLADE SURG SZ10 CARB STEEL (BLADE) IMPLANT
COVER BACK TABLE 60X90IN (DRAPES) ×1 IMPLANT
COVER TIP SHEARS 8 DVNC (MISCELLANEOUS) ×1 IMPLANT
DERMABOND ADVANCED .7 DNX12 (GAUZE/BANDAGES/DRESSINGS) ×1 IMPLANT
DRAPE ARM DVNC X/XI (DISPOSABLE) ×4 IMPLANT
DRAPE COLUMN DVNC XI (DISPOSABLE) ×1 IMPLANT
DRAPE SHEET LG 3/4 BI-LAMINATE (DRAPES) ×1 IMPLANT
DRAPE SURG IRRIG POUCH 19X23 (DRAPES) ×1 IMPLANT
DRIVER NDL MEGA 8 DVNC XI (INSTRUMENTS) ×2 IMPLANT
DRIVER NDLE MEGA DVNC XI (INSTRUMENTS) ×2 IMPLANT
DRSG OPSITE POSTOP 4X6 (GAUZE/BANDAGES/DRESSINGS) IMPLANT
DRSG OPSITE POSTOP 4X8 (GAUZE/BANDAGES/DRESSINGS) IMPLANT
ELECT PENCIL ROCKER SW 15FT (MISCELLANEOUS) IMPLANT
ELECT REM PT RETURN 15FT ADLT (MISCELLANEOUS) ×1 IMPLANT
FORCEPS BPLR FENES DVNC XI (FORCEP) ×1 IMPLANT
FORCEPS PROGRASP DVNC XI (FORCEP) ×1 IMPLANT
GAUZE 4X4 16PLY ~~LOC~~+RFID DBL (SPONGE) ×1 IMPLANT
GLOVE BIO SURGEON STRL SZ 6 (GLOVE) ×4 IMPLANT
GLOVE BIO SURGEON STRL SZ 6.5 (GLOVE) ×1 IMPLANT
GLOVE BIOGEL PI IND STRL 6.5 (GLOVE) ×2 IMPLANT
GOWN STRL REUS W/ TWL LRG LVL3 (GOWN DISPOSABLE) ×4 IMPLANT
GOWN STRL REUS W/TWL LRG LVL3 (GOWN DISPOSABLE) ×4
GRASPER SUT TROCAR 14GX15 (MISCELLANEOUS) IMPLANT
HOLDER FOLEY CATH W/STRAP (MISCELLANEOUS) IMPLANT
IRRIG SUCT STRYKERFLOW 2 WTIP (MISCELLANEOUS) ×1
IRRIGATION SUCT STRKRFLW 2 WTP (MISCELLANEOUS) ×1 IMPLANT
KIT PROCEDURE DVNC SI (MISCELLANEOUS) IMPLANT
KIT TURNOVER KIT A (KITS) IMPLANT
LIGASURE IMPACT 36 18CM CVD LR (INSTRUMENTS) IMPLANT
MANIPULATOR ADVINCU DEL 3.0 PL (MISCELLANEOUS) IMPLANT
MANIPULATOR ADVINCU DEL 3.5 PL (MISCELLANEOUS) IMPLANT
MANIPULATOR UTERINE 4.5 ZUMI (MISCELLANEOUS) IMPLANT
NDL HYPO 21X1.5 SAFETY (NEEDLE) ×1 IMPLANT
NDL INSUFFLATION 14GA 120MM (NEEDLE) IMPLANT
NDL SPNL 18GX3.5 QUINCKE PK (NEEDLE) IMPLANT
NEEDLE HYPO 21X1.5 SAFETY (NEEDLE) ×1 IMPLANT
NEEDLE INSUFFLATION 14GA 120MM (NEEDLE) IMPLANT
NEEDLE SPNL 18GX3.5 QUINCKE PK (NEEDLE) IMPLANT
OBTURATOR OPTICAL STND 8 DVNC (TROCAR) ×1
OBTURATOR OPTICALSTD 8 DVNC (TROCAR) ×1 IMPLANT
PACK ROBOT GYN CUSTOM WL (TRAY / TRAY PROCEDURE) ×1 IMPLANT
PAD ARMBOARD 7.5X6 YLW CONV (MISCELLANEOUS) ×1 IMPLANT
PAD POSITIONING PINK XL (MISCELLANEOUS) ×1 IMPLANT
PORT ACCESS TROCAR AIRSEAL 12 (TROCAR) IMPLANT
SCISSORS MNPLR CVD DVNC XI (INSTRUMENTS) ×1 IMPLANT
SCRUB CHG 4% DYNA-HEX 4OZ (MISCELLANEOUS) ×2 IMPLANT
SEAL UNIV 5-12 XI (MISCELLANEOUS) ×3 IMPLANT
SET TRI-LUMEN FLTR TB AIRSEAL (TUBING) ×1 IMPLANT
SPIKE FLUID TRANSFER (MISCELLANEOUS) ×1 IMPLANT
SPONGE T-LAP 18X18 ~~LOC~~+RFID (SPONGE) IMPLANT
SURGIFLO W/THROMBIN 8M KIT (HEMOSTASIS) IMPLANT
SUT MNCRL AB 4-0 PS2 18 (SUTURE) IMPLANT
SUT PDS AB 1 TP1 54 (SUTURE) IMPLANT
SUT VIC AB 0 CT1 27 (SUTURE)
SUT VIC AB 0 CT1 27XBRD ANTBC (SUTURE) IMPLANT
SUT VIC AB 2-0 CT1 27 (SUTURE)
SUT VIC AB 2-0 CT1 TAPERPNT 27 (SUTURE) IMPLANT
SUT VIC AB 3-0 SH 27 (SUTURE) ×1
SUT VIC AB 3-0 SH 27X BRD (SUTURE) IMPLANT
SUT VICRYL 0 27 CT2 27 ABS (SUTURE) IMPLANT
SUT VICRYL 4-0 PS2 18IN ABS (SUTURE) ×2 IMPLANT
SUT VLOC 180 0 9IN  GS21 (SUTURE)
SUT VLOC 180 0 9IN GS21 (SUTURE) IMPLANT
SYR 10ML LL (SYRINGE) IMPLANT
SYS BAG RETRIEVAL 10MM (BASKET)
SYS WOUND ALEXIS 18CM MED (MISCELLANEOUS)
SYSTEM BAG RETRIEVAL 10MM (BASKET) IMPLANT
SYSTEM WOUND ALEXIS 18CM MED (MISCELLANEOUS) IMPLANT
TOWEL OR NON WOVEN STRL DISP B (DISPOSABLE) IMPLANT
TRAP SPECIMEN MUCUS 40CC (MISCELLANEOUS) IMPLANT
TRAY FOLEY MTR SLVR 16FR STAT (SET/KITS/TRAYS/PACK) ×1 IMPLANT
TROCAR PORT AIRSEAL 5X120 (TROCAR) IMPLANT
UNDERPAD 30X36 HEAVY ABSORB (UNDERPADS AND DIAPERS) ×2 IMPLANT
WATER STERILE IRR 1000ML POUR (IV SOLUTION) ×1 IMPLANT
YANKAUER SUCT BULB TIP 10FT TU (MISCELLANEOUS) IMPLANT

## 2023-01-28 NOTE — Discharge Instructions (Addendum)
AFTER SURGERY INSTRUCTIONS   Return to work: 4-6 weeks if applicable   Activity: 1. Be up and out of the bed during the day.  Take a nap if needed.  You may walk up steps but be careful and use the hand rail.  Stair climbing will tire you more than you think, you may need to stop part way and rest.    2. No lifting or straining for 6 weeks over 10 pounds. No pushing, pulling, straining for 6 weeks.   3. No driving for around 1 week(s).  Do not drive if you are taking narcotic pain medicine and make sure that your reaction time has returned.    4. You can shower as soon as the next day after surgery. Shower daily.  Use your regular soap and water (not directly on the incision) and pat your incision(s) dry afterwards; don't rub.  No tub baths or submerging your body in water until cleared by your surgeon. If you have the soap that was given to you by pre-surgical testing that was used before surgery, you do not need to use it afterwards because this can irritate your incisions.    5. No sexual activity and nothing in the vagina for 4-6 weeks, 12 weeks if you have a hysterectomy (removal of the uterus and cervix).   6. You may experience a small amount of clear drainage from your incisions, which is normal.  If the drainage persists, increases, or changes color please call the office.   7. Do not use creams, lotions, or ointments such as neosporin on your incisions after surgery until advised by your surgeon because they can cause removal of the dermabond glue on your incisions.     8. You may experience vaginal spotting after surgery or around the 6-8 week mark from surgery when the stitches at the top of the vagina begin to dissolve (if you have a hysterectomy).  The spotting is normal but if you experience heavy bleeding, call our office.   9. Take Tylenol or ibuprofen first for pain if you are able to take these medications and only use narcotic pain medication for severe pain not relieved by  the Tylenol or Ibuprofen.  Monitor your Tylenol intake to a max of 4,000 mg in a 24 hour period. You can alternate these medications after surgery.   Diet: 1. Low sodium Heart Healthy Diet is recommended but you are cleared to resume your normal (before surgery) diet after your procedure.   2. It is safe to use a laxative, such as Miralax or Colace, if you have difficulty moving your bowels. You have been prescribed Sennakot-S to take at bedtime every evening after surgery to keep bowel movements regular and to prevent constipation.     Wound Care: 1. Keep clean and dry.  Shower daily.   Reasons to call the Doctor: Fever - Oral temperature greater than 100.4 degrees Fahrenheit Foul-smelling vaginal discharge Difficulty urinating Nausea and vomiting Increased pain at the site of the incision that is unrelieved with pain medicine. Difficulty breathing with or without chest pain New calf pain especially if only on one side Sudden, continuing increased vaginal bleeding with or without clots.   Contacts: For questions or concerns you should contact:   Dr. Clide Cliff at [phone]   Warner Mccreedy, NP at [phone]   After Hours: call [phone] and have the GYN Oncologist paged/contacted (after 5 pm or on the weekends). You will speak with an after hours RN and  let he or she know you have had surgery.   Messages sent via mychart are for non-urgent matters and are not responded to after hours so for urgent needs, please call the after hours number.

## 2023-01-28 NOTE — Transfer of Care (Signed)
Immediate Anesthesia Transfer of Care Note  Patient: Jessica Casey  Procedure(s) Performed: XI ROBOTIC ASSISTED TOTAL HYSTERECTOMY WITH BILATERAL SALPINGO OOPHORECTOMY (Bilateral: Abdomen)  Patient Location: PACU  Anesthesia Type:General  Level of Consciousness: drowsy and patient cooperative  Airway & Oxygen Therapy: Patient Spontanous Breathing and Patient connected to face mask oxygen  Post-op Assessment: Report given to RN and Post -op Vital signs reviewed and stable  Post vital signs: Reviewed and stable  Last Vitals:  Vitals Value Taken Time  BP 134/74 01/28/23 1700  Temp    Pulse 91 01/28/23 1703  Resp 15 01/28/23 1703  SpO2 100 % 01/28/23 1703  Vitals shown include unvalidated device data.  Last Pain:  Vitals:   01/28/23 1206  PainSc: 0-No pain         Complications: No notable events documented.

## 2023-01-28 NOTE — Op Note (Signed)
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 Mildly enlarged right ovary. Enlarged left ovary with solid lobular mass. Filmy adhesions from the left ovary to the rectum and to the left ovarian fossa. No direct connection to the rectum or to a diverticulum. No apparent enlarged diverticulum in the area. Adhesions of the sigmoid colon to the left pelvic sidewall. Small midplane uterus. 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    Specimens:  ID Type Source Tests Collected by Time Destination  1 : ? left ovary, uterus, cervix, right tube and ovary Tissue PATH Gyn biopsy SURGICAL PATHOLOGY Clide Cliff, MD 01/28/2023 1547   A : Pelvic washings Body Fluid PATH Cytology washing CYTOLOGY - NON PAP Clide Cliff, MD 01/28/2023 1526     Complications:  None  Indications for Procedure: Jessica Casey is a 72 y.o. woman with a solid left adnexal mass and possible prominent adjacent diverticulum.  Prior to the procedure, all risks, benefits, and alternatives were discussed and informed surgical consent was signed.  Procedure: Patient was taken to the operating room where general anesthesia was achieved.  She was positioned in dorsal  lithotomy and prepped and draped.  A foley catheter was inserted into the bladder. The cervix was dilated and an Advincula uterine manipulator with a colpotomy ring was inserted into the uterus.  A 5 mm incision was made in the left upper quadrant near Palmer's point.  The abdomen was entered with a 5 mm OptiView trocar under direct visualization.  The abdomen was insufflated, the patient placed in steep Trendelenburg, and additional trocars were placed as follows: an 8mm trocar superior to the umbilicus, one 8 mm robotic trocar in the right abdomen, and one 8 mm robotic trocar in the left abdomen.  The left upper quadrant trocar was removed and replaced with a 5 mm airseal trocar.  All trocars were placed under direct visualization.  The bowels were moved into the upper abdomen.  The DaVinci robotic surgical system was brought to the patient's bedside and docked.  The right round ligament was transected and the retroperitoneum entered.  The right ureter was identified. The right infundibulopelvic ligament was isolated, cauterized, and transected. The posterior peritoneum was opened to the KOH ring.  The anterior peritoneum was opened and the bladder flap was created.  The right uterine artery was skeletonized, cauterized, and transected at the level of the KOH ring. Additional cautery was used in a C-shaped fashion to allow the remainder of the broad, cardinal, and uterosacral ligaments with the uterine vessels to be transected and fall away from the KOH ring. Adhesions of the sigmoid colon to the left pelvic sidewall were lysed sharply with scissors. Then filmy adhesions between the left ovary and the rectum were lysed sharply with scissors with no adherent diverticulum noted. The left ovary was blunt and sharply dissected from the ovarian fossa.  Then a similar procedure as above was performed on the left side.  A colpotomy was made circumferentially following the contours of the KOH ring.  The uterine  specimen was removed through the vagina.    The vaginal cuff was closed with a running stitch of 0 Vicryl suture. One figure of eight stitch with 3-0 vicryl was placed on the bladder for hemostasis. The pelvis was irrigated and all operative sites were found to be hemostatic.  All instruments were removed and the robot was taken from the patient's bedside. The abdomen was desufflated and all ports were removed. The skin at all incisions was closed with 4-0 Vicryl to reapproximate the subcutaneous tissue and 4-0 monocryl in a subcuticular fashion followed by surgical glue.  Patient tolerated the procedure well. Sponge, lap, and instrument counts were correct.  Patient received 2 gm of Ancef prior to skin incision for routine perioperative antibiotic prophylaxis.  She was extubated and taken to the PACU in stable condition.  Clide Cliff, MD Gynecologic Oncology

## 2023-01-28 NOTE — Brief Op Note (Signed)
01/28/2023  5:12 PM  PATIENT:  Jessica Casey  72 y.o. female  PRE-OPERATIVE DIAGNOSIS:  PELVIC MASS  POST-OPERATIVE DIAGNOSIS:  OVARIAN FIBROTHECOMA  PROCEDURE:  Procedure(s): XI ROBOTIC ASSISTED TOTAL HYSTERECTOMY WITH BILATERAL SALPINGO OOPHORECTOMY (Bilateral)  SURGEON:  Surgeon(s) and Role:    Clide Cliff, MD - Primary    * Antionette Char, MD - Assisting  ANESTHESIA:   general  EBL:  20 mL   BLOOD ADMINISTERED:none  DRAINS: none   LOCAL MEDICATIONS USED:  BUPIVICAINE   SPECIMEN:   ID Type Source Tests Collected by Time Destination  1 : ? left ovary, uterus, cervix, right tube and ovary Tissue PATH Gyn biopsy SURGICAL PATHOLOGY Clide Cliff, MD 01/28/2023 1547   A : Pelvic washings Body Fluid PATH Cytology washing CYTOLOGY - NON PAP Clide Cliff, MD 01/28/2023 1526      DISPOSITION OF SPECIMEN:  PATHOLOGY  COUNTS:  YES  TOURNIQUET:  * No tourniquets in log *  DICTATION: .Note written in EPIC  PLAN OF CARE: Discharge to home after PACU  PATIENT DISPOSITION:  PACU - hemodynamically stable.   Delay start of Pharmacological VTE agent (>24hrs) due to surgical blood loss or risk of bleeding: not applicable

## 2023-01-28 NOTE — Anesthesia Postprocedure Evaluation (Signed)
Anesthesia Post Note  Patient: Jessica Casey  Procedure(s) Performed: XI ROBOTIC ASSISTED TOTAL HYSTERECTOMY WITH BILATERAL SALPINGO OOPHORECTOMY (Bilateral: Abdomen)     Patient location during evaluation: PACU Anesthesia Type: General Level of consciousness: awake and alert Pain management: pain level controlled Vital Signs Assessment: post-procedure vital signs reviewed and stable Respiratory status: spontaneous breathing, nonlabored ventilation, respiratory function stable and patient connected to nasal cannula oxygen Cardiovascular status: blood pressure returned to baseline and stable Postop Assessment: no apparent nausea or vomiting Anesthetic complications: no  No notable events documented.  Last Vitals:  Vitals:   01/28/23 1833 01/28/23 1900  BP:  (!) 148/70  Pulse: 89 99  Resp: 16   Temp:  37.1 C  SpO2: 95% 94%    Last Pain:  Vitals:   01/28/23 1857  PainSc: 3                  Shelton Silvas

## 2023-01-28 NOTE — Interval H&P Note (Signed)
History and Physical Interval Note:  01/28/2023 1:25 PM  Jessica Casey  has presented today for surgery, with the diagnosis of PELVIC MASS.  The various methods of treatment have been discussed with the patient and family. After consideration of risks, benefits and other options for treatment, the patient has consented to  Procedure(s): XI ROBOTIC ASSISTED TOTAL HYSTERECTOMY WITH BILATERAL SALPINGO OOPHORECTOMY; POSSIBLE STAGING (Bilateral) as a surgical intervention.  The patient's history has been reviewed, patient examined, no change in status, stable for surgery.  I have reviewed the patient's chart and labs.  Questions were answered to the patient's satisfaction.     Trever Streater

## 2023-01-28 NOTE — Anesthesia Preprocedure Evaluation (Addendum)
Anesthesia Evaluation  Patient identified by MRN, date of birth, ID band Patient awake    Reviewed: Allergy & Precautions, NPO status , Patient's Chart, lab work & pertinent test results  Airway Mallampati: I  TM Distance: >3 FB Neck ROM: Full    Dental  (+) Caps, Dental Advisory Given   Pulmonary sleep apnea    breath sounds clear to auscultation       Cardiovascular negative cardio ROS  Rhythm:Regular Rate:Normal     Neuro/Psych  PSYCHIATRIC DISORDERS  Depression    negative neurological ROS     GI/Hepatic Neg liver ROS,GERD  Medicated,,  Endo/Other  negative endocrine ROS    Renal/GU Renal disease     Musculoskeletal  (+) Arthritis ,    Abdominal   Peds  Hematology negative hematology ROS (+)   Anesthesia Other Findings   Reproductive/Obstetrics                             Anesthesia Physical Anesthesia Plan  ASA: 2  Anesthesia Plan: General   Post-op Pain Management: Tylenol PO (pre-op)*   Induction: Intravenous  PONV Risk Score and Plan: 4 or greater and Ondansetron, Dexamethasone and Treatment may vary due to age or medical condition  Airway Management Planned: Oral ETT  Additional Equipment: None  Intra-op Plan:   Post-operative Plan: Extubation in OR  Informed Consent: I have reviewed the patients History and Physical, chart, labs and discussed the procedure including the risks, benefits and alternatives for the proposed anesthesia with the patient or authorized representative who has indicated his/her understanding and acceptance.     Dental advisory given  Plan Discussed with: CRNA  Anesthesia Plan Comments: (-2 IV's)       Anesthesia Quick Evaluation

## 2023-01-28 NOTE — Anesthesia Procedure Notes (Signed)
Procedure Name: Intubation Date/Time: 01/28/2023 2:38 PM  Performed by: Ludwig Lean, CRNAPre-anesthesia Checklist: Patient identified, Emergency Drugs available, Suction available and Patient being monitored Patient Re-evaluated:Patient Re-evaluated prior to induction Oxygen Delivery Method: Circle system utilized Preoxygenation: Pre-oxygenation with 100% oxygen Induction Type: IV induction Ventilation: Mask ventilation without difficulty Laryngoscope Size: Mac and 3 Grade View: Grade I Tube type: Oral Tube size: 7.0 mm Number of attempts: 1 Airway Equipment and Method: Bougie stylet Placement Confirmation: ETT inserted through vocal cords under direct vision, positive ETCO2 and breath sounds checked- equal and bilateral Secured at: 21 cm Tube secured with: Tape Dental Injury: Teeth and Oropharynx as per pre-operative assessment  Comments: Elective use of bougie, pt grade 1 view with mac 3. Intubation by Bartholomew Crews

## 2023-01-29 ENCOUNTER — Telehealth: Payer: Self-pay

## 2023-01-29 ENCOUNTER — Encounter (HOSPITAL_COMMUNITY): Payer: Self-pay | Admitting: Psychiatry

## 2023-01-29 NOTE — Telephone Encounter (Signed)
Spoke with Jessica Casey this morning. She states she is eating, drinking and urinating well. She has not had a BM yet and has not passed gas. She is taking senokot as prescribed and encouraged her to drink plenty of water. She denies fever or chills. Incisions are dry and intact. She rates her pain 0/10. Pain in shoulders from gas, Instructed to call office with any fever, chills, purulent drainage, uncontrolled pain or any other questions or concerns. Patient verbalizes understanding.   Pt aware of post op appointments as well as the office number [phone] and after hours number [phone] to call if she has any questions or concerns

## 2023-01-30 ENCOUNTER — Encounter: Payer: Self-pay | Admitting: Obstetrics and Gynecology

## 2023-01-30 LAB — CYTOLOGY - NON PAP

## 2023-01-30 LAB — SURGICAL PATHOLOGY

## 2023-02-03 ENCOUNTER — Telehealth: Payer: Self-pay | Admitting: Oncology

## 2023-02-03 ENCOUNTER — Encounter: Payer: Self-pay | Admitting: Psychiatry

## 2023-02-03 ENCOUNTER — Other Ambulatory Visit: Payer: Self-pay | Admitting: Gynecologic Oncology

## 2023-02-03 DIAGNOSIS — M79605 Pain in left leg: Secondary | ICD-10-CM

## 2023-02-03 NOTE — Telephone Encounter (Signed)
Called Jessica Casey regarding her MyChart message about left leg pain.  Advised her that Warner Mccreedy, NP has ordered a doppler of her left leg to rule out a DVT.  Let her know that it is scheduled at 2:00 tomorrow at [facility].  She verbalized understanding and agreement of the appointment date and time.

## 2023-02-03 NOTE — Telephone Encounter (Signed)
Called Jessica Casey and moved her post op appointment to 3:30 instead of 4:00 on 02/10/23.  She verbalized agreement of the appointment time change.

## 2023-02-04 ENCOUNTER — Ambulatory Visit (HOSPITAL_COMMUNITY)
Admission: RE | Admit: 2023-02-04 | Discharge: 2023-02-04 | Disposition: A | Discharge status: Home / Self Care | Payer: Medicare PPO | Source: Ambulatory Visit | Attending: Gynecologic Oncology | Admitting: Gynecologic Oncology

## 2023-02-04 DIAGNOSIS — M79605 Pain in left leg: Secondary | ICD-10-CM | POA: Insufficient documentation

## 2023-02-04 DIAGNOSIS — R1900 Intra-abdominal and pelvic swelling, mass and lump, unspecified site: Secondary | ICD-10-CM

## 2023-02-04 NOTE — Progress Notes (Signed)
Left lower extremity venous duplex has been completed. Preliminary results can be found in CV Proc through chart review.  Results were given to Warner Mccreedy NP.  02/04/23 2:02 PM Olen Cordial RVT

## 2023-02-10 ENCOUNTER — Inpatient Hospital Stay: Payer: Medicare PPO | Attending: Psychiatry | Admitting: Psychiatry

## 2023-02-10 ENCOUNTER — Other Ambulatory Visit: Payer: Self-pay

## 2023-02-10 VITALS — BP 121/68 | HR 100 | Temp 98.8°F | Resp 18 | Ht <= 58 in | Wt 115.8 lb

## 2023-02-10 DIAGNOSIS — Z90710 Acquired absence of both cervix and uterus: Secondary | ICD-10-CM

## 2023-02-10 DIAGNOSIS — Z90722 Acquired absence of ovaries, bilateral: Secondary | ICD-10-CM

## 2023-02-10 DIAGNOSIS — M62830 Muscle spasm of back: Secondary | ICD-10-CM

## 2023-02-10 DIAGNOSIS — M5432 Sciatica, left side: Secondary | ICD-10-CM

## 2023-02-10 DIAGNOSIS — R1900 Intra-abdominal and pelvic swelling, mass and lump, unspecified site: Secondary | ICD-10-CM

## 2023-02-10 NOTE — Progress Notes (Signed)
Gynecologic Oncology Return Clinic Visit  Date of Service: 02/10/2023 Referring Provider: Janean Sark, MD   Assessment & Plan: Jessica Casey is a 72 y.o. woman with bilateral ovarian fibromas who is 2 weeks s/p RA-TLH, BSO on 01/28/23.  Postop: - Pt recovering well from surgery and healing appropriately postoperatively - Intraoperative findings and pathology results reviewed. - Ongoing postoperative expectations and precautions reviewed. Continue with no lifting >10lbs through 6 weeks postoperatively.  Nothing in the vagina for 8 to 12 weeks. -Given some friability of the cuff, will have patient return for repeat cuff check.  Could consider vaginal estrogen at that time pending findings.  Back/left leg pain: - Discussed that this may be due to positioning from surgery. Does have TTP of left paraspinal muscles so could be a component of muscle spasm. - No red flag findings on neuro exam. - Recommend starting with heat to the area. - Will re-evaluate at follow-up visit to see if additional work-up/referral indicated. - Pt in agreement with plan.  RTC 2-3 weeks.  Clide Cliff, MD Gynecologic Oncology   Medical Decision Making I personally spent  TOTAL 23 minutes face-to-face and non-face-to-face in the care of this patient, which includes all pre, intra, and post visit time on the date of service.  ----------------------- Reason for Visit: Postop  Interval History: Pt reports that she is recovering well from surgery. She is not needing anything for pain. She is eating and drinking well. She is voiding without issue and having regular bowel movements.  She does report a left leg ache and tingling in her left foot.  She reports this hurts more when sitting down and is improved when standing and walking.  Tramadol did not help.  She does report prior back and neck surgery.  She does not follow anyone for this at this time.   Past Medical/Surgical History: Past Medical  History:  Diagnosis Date   Cataract    bilateral removed   Colonic polyp 2005 & 2013   DDD (degenerative disc disease)    Depression    Diverticulitis large intestine 12/12/2022   GERD (gastroesophageal reflux disease)    H/O echocardiogram    before 2000, no need for f/u /w cardiac    Heart murmur    MVP- echo- 2010, no symptoms    Memory disorder 09/19/2017   Mononucleosis 1971   Osteopenia after menopause    Ovarian mass, left 12/12/2022   See MRI 12/12/22   Sleep apnea    cpap    Past Surgical History:  Procedure Laterality Date   ANTERIOR CERVICAL DECOMP/DISCECTOMY FUSION N/A 05/20/2013   Procedure: ANTERIOR CERVICAL DECOMPRESSION/DISCECTOMY FUSION 2 LEVELS;  Surgeon: Maeola Harman, MD;  Location: [facility];  Service: Neurosurgery;  Laterality: N/A;  Cervical Seven-Thoracic One, Thoracic One-Two Anterior cervical decompression/Diskectomy/Fusion   CARPAL TUNNEL RELEASE Right    CATARACT EXTRACTION, BILATERAL  10/01/2011   Dr Nile Riggs, Lacretia Nicks IOL   CERVICAL FUSION  2014   3 proceduresr Venetia Maxon, 2007, 20012 and 2014   COLONOSCOPY  2018   SA- TA   colonoscopy with polypectomy  10/01/2011   Dr Arlyce Dice   KNEE SURGERY     Dr Meade Maw ; bursa cystectomy   LUMBAR DISC SURGERY  2010   Dr.Stern and 1999   ROBOTIC ASSISTED TOTAL HYSTERECTOMY WITH BILATERAL SALPINGO OOPHERECTOMY Bilateral 01/28/2023   Procedure: XI ROBOTIC ASSISTED TOTAL HYSTERECTOMY WITH BILATERAL SALPINGO OOPHORECTOMY;  Surgeon: Clide Cliff, MD;  Location: [facility];  Service: Gynecology;  Laterality: Bilateral;   ROTATOR CUFF REPAIR     Dr.Wainer   TOTAL HIP ARTHROPLASTY Right 09/04/2021   Procedure: TOTAL HIP ARTHROPLASTY ANTERIOR APPROACH;  Surgeon: Sheral Apley, MD;  Location: [facility];  Service: Orthopedics;  Laterality: Right;   UMBILICAL HERNIA REPAIR  1952   as a baby   WRIST FRACTURE SURGERY Left 02/28/2013   Dr. Mckinley Jewel    Family History  Problem Relation Age of Onset   Breast cancer Mother         estorgen receptor positive   Stroke Mother 71       Dec   Diabetes Father    Coronary artery disease Father        S/P CBAG , no MI -- Dec   Depression Father    Diverticulosis Father    Breast cancer Sister        estrogen receptor positive   Depression Sister    Depression Sister    Depression Sister    Depression Sister    Colon polyps Sister    Depression Brother    Colon cancer Maternal Grandfather    Bipolar disorder Other        Neice   Esophageal cancer Neg Hx    Pancreatic cancer Neg Hx    Rectal cancer Neg Hx    Stomach cancer Neg Hx    Ovarian cancer Neg Hx    Endometrial cancer Neg Hx     Social History   Socioeconomic History   Marital status: Married    Spouse name: Not on file   Number of children: Not on file   Years of education: 16+   Highest education level: Master's degree (e.g., MA, MS, MEng, MEd, MSW, MBA)  Occupational History   Occupation: Runner, broadcasting/film/video- Retired  Tobacco Use   Smoking status: Never   Smokeless tobacco: Never  Vaping Use   Vaping Use: Never used  Substance and Sexual Activity   Alcohol use: Never   Drug use: Never   Sexual activity: Not Currently    Partners: Male    Birth control/protection: Post-menopausal  Other Topics Concern   Not on file  Social History Narrative   Lives   Caffeine use:    Right handed    Social Determinants of Health   Financial Resource Strain: Low Risk  (11/10/2022)   Overall Financial Resource Strain (CARDIA)    Difficulty of Paying Living Expenses: Not hard at all  Food Insecurity: No Food Insecurity (12/16/2022)   Hunger Vital Sign    Worried About Running Out of Food in the Last Year: Never true    Ran Out of Food in the Last Year: Never true  Transportation Needs: No Transportation Needs (12/16/2022)   PRAPARE - Administrator, Civil Service (Medical): No    Lack of Transportation (Non-Medical): No  Physical Activity: Insufficiently Active (11/10/2022)   Exercise Vital  Sign    Days of Exercise per Week: 3 days    Minutes of Exercise per Session: 30 min  Stress: Patient Declined (11/10/2022)   [employer] of Occupational Health - Occupational Stress Questionnaire    Feeling of Stress : Patient declined  Social Connections: Socially Integrated (11/10/2022)   Social Connection and Isolation Panel [NHANES]    Frequency of Communication with Friends and Family: More than three times a week    Frequency of Social Gatherings with Friends and Family: Patient declined    Attends Religious Services: More than 4 times per  year    Active Member of Clubs or Organizations: Yes    Attends Banker Meetings: More than 4 times per year    Marital Status: Married    Current Medications:  Current Outpatient Medications:    acetaminophen (TYLENOL) 650 MG CR tablet, Take 1,300 mg by mouth every 8 (eight) hours as needed for pain., Disp: , Rfl:    Bacillus Coagulans-Inulin (ALIGN PREBIOTIC-PROBIOTIC PO), Take by mouth., Disp: , Rfl:    Biotin 1000 MCG tablet, Take 1,000 mcg by mouth daily., Disp: , Rfl:    buPROPion (WELLBUTRIN XL) 300 MG 24 hr tablet, Take 1 tablet (300 mg total) by mouth daily., Disp: 90 tablet, Rfl: 3   Calcium Carb-Cholecalciferol (CALCIUM 600 + D PO), Take 1 tablet by mouth daily., Disp: , Rfl:    Cholecalciferol (VITAMIN D) 2000 UNITS CAPS, Take 2,000 Units by mouth daily., Disp: , Rfl:    Cyanocobalamin (B-12) 1000 MCG CAPS, Take 1,000 mcg by mouth daily., Disp: , Rfl:    denosumab (PROLIA) 60 MG/ML SOSY injection, Inject 60 mg into the skin every 6 (six) months. At MD office, last done 03/2022, Disp: , Rfl:    diclofenac sodium (VOLTAREN) 1 % GEL, Apply 2 g topically 4 (four) times daily. (Patient taking differently: Apply 2 g topically 4 (four) times daily as needed (pain).), Disp: 3 Tube, Rfl: 3   FLUoxetine (PROZAC) 20 MG tablet, Take 1 tablet (20 mg total) by mouth daily., Disp: 90 tablet, Rfl: 3   Multiple Vitamin (MULTIVITAMIN  WITH MINERALS) TABS tablet, Take 1 tablet by mouth daily., Disp: , Rfl:    omeprazole (PRILOSEC) 40 MG capsule, TAKE 1 CAPSULE (40 MG TOTAL) BY MOUTH DAILY., Disp: 30 capsule, Rfl: 3  Review of Symptoms: Complete 10-system review is positive for: Numbness, vaginal discharge, left leg pain ?sciatica  Physical Exam: BP 121/68 (BP Location: Left Arm, Patient Position: Sitting)   Pulse 100   Temp 98.8 F (37.1 C) (Oral)   Resp 18   Ht 4' 9.48" (1.46 m)   Wt 115 lb 12.8 oz (52.5 kg)   LMP 10/01/1995 (Approximate)   SpO2 99%   BMI 24.64 kg/m  General: Alert, oriented, no acute distress. HEENT: Normocephalic, atraumatic. Neck symmetric without masses. Sclera anicteric.  Chest: Normal work of breathing. Clear to auscultation bilaterally.   Cardiovascular: Regular rate and rhythm, no murmurs. Abdomen: Soft, nontender.  Normoactive bowel sounds.  No masses appreciated.  Well-healing incisions. Extremities: Grossly normal range of motion.  Warm, well perfused.  No edema bilaterally. Back: Palpation of spinous processes and paraspinal muscles with tenderness palpation of the left paraspinal muscles in the lower back, lumbar region. Skin: No rashes or lesions noted. Neuro: 5/5 strength of LLE. Normal patellar reflex Intact sensation to soft touch of LLE. GU: Normal appearing external genitalia without erythema, excoriation, or lesions.  Speculum exam reveals intact vaginal cuff but with some friability, atrophic changes.  Bimanual exam reveals intact vaginal cuff without tenderness or fluctuance. Exam chaperoned by Kimberly [place], CMA    Laboratory & Radiologic Studies: FINAL MICROSCOPIC DIAGNOSIS:   A. UTERUS, CERVIX, BILATERAL SALPINGO OOPHORECTOMY:   Left ovary:            Fibroma (6.3 cm).            Negative for malignancy.        Right ovary:            Fibroma (3.7 cm).  Negative for malignancy.   Ectocervix:  Unremarkable.  Negative for intraepithelial lesion or  malignancy.        Endocervix:            Nabothian cysts and tunnel clusters.            Negative for hyperplasia, atypia or malignancy.        Endometrium:            Inactive endometrium.            Negative for hyperplasia, atypia or malignancy.        Serosa:            Unremarkable.            Negative for malignancy.        Bilateral fallopian tubes:            Benign fimbriated fallopian tubes with paratubal cysts.            Negative for malignancy.   FINAL MICROSCOPIC DIAGNOSIS:  - No malignant cells identified

## 2023-02-10 NOTE — Patient Instructions (Signed)
It was a pleasure to see you in clinic today. - Try some heat on your lower back and we will see if that improves your symptoms. - Return visit planned for 2-3 weeks  Thank you very much for allowing me to provide care for you today.  I appreciate your confidence in choosing our Gynecologic Oncology team at [facility].  If you have any questions about your visit today please call our office or send [place] a MyChart message and we will get back to you as soon as possible.

## 2023-02-13 ENCOUNTER — Encounter: Payer: Self-pay | Admitting: Psychiatry

## 2023-02-17 ENCOUNTER — Encounter: Payer: Medicare PPO | Admitting: Psychiatry

## 2023-02-26 ENCOUNTER — Inpatient Hospital Stay (HOSPITAL_BASED_OUTPATIENT_CLINIC_OR_DEPARTMENT_OTHER): Payer: Medicare PPO | Admitting: Gynecologic Oncology

## 2023-02-26 VITALS — BP 135/69 | HR 82 | Temp 98.3°F | Resp 16 | Ht 58.66 in | Wt 118.0 lb

## 2023-02-26 DIAGNOSIS — Z90710 Acquired absence of both cervix and uterus: Secondary | ICD-10-CM

## 2023-02-26 DIAGNOSIS — D279 Benign neoplasm of unspecified ovary: Secondary | ICD-10-CM

## 2023-02-26 DIAGNOSIS — Z90722 Acquired absence of ovaries, bilateral: Secondary | ICD-10-CM

## 2023-02-26 NOTE — Progress Notes (Signed)
Gynecologic Oncology Return Clinic Visit  Date of Service: 02/26/2023 Referring Provider: Janean Sark, MD   Assessment & Plan: Jessica Casey is a 72 y.o. woman with bilateral ovarian fibromas who is 4 weeks s/p RA-TLH, BSO on 01/28/23.  Postop: - Pt recovering well from surgery and healing appropriately postoperatively - Intraoperative findings and pathology results reviewed at recent visit with Dr. Alvester Morin. - Ongoing postoperative expectations and precautions reviewed. Continue with no lifting >10lbs through 6 weeks postoperatively.  Nothing in the vagina for 10 to 12 weeks. -Vaginal cuff healing well. Vaginal estrogen not necessary at this time. -She is advised to follow up with her regular providers including Dr. Edward Jolly for her continued well woman care. No follow up necessary at this time with Dr. Alvester Morin unless needs arise in the future. She is advised to call for any needs, concerns, or new/persistent symptoms.  Warner Mccreedy, NP Gynecologic Oncology  Medical Decision Making I personally spent TOTAL 15 minutes face-to-face and non-face-to-face in the care of this patient, which includes all pre, intra, and post visit time on the date of service.  ----------------------- Reason for Visit: Postop, Vaginal cuff assessment  Interval History: Patient reports doing well since last visit.  Tolerating diet with no nausea or emesis.  Bladder functioning without difficulty.  Bowels are functioning.  She said prior to surgery she would usually go 3 to 4 days without having a bowel movement and now she has a BM daily.  No vaginal bleeding reported.  She continues to report light vaginal discharge that is yellowish/brown in color.  She feels like the discharge is decreasing in amount.  No pain reported.  No issues voiced with abdominal incisions.  No abdominal pain reported.  Her husband recently had right shoulder surgery.  No lower extremity edema reported. No concerns voiced.  Past  Medical/Surgical History: Past Medical History:  Diagnosis Date   Cataract    bilateral removed   Colonic polyp 2005 & 2013   DDD (degenerative disc disease)    Depression    Diverticulitis large intestine 12/12/2022   GERD (gastroesophageal reflux disease)    H/O echocardiogram    before 2000, no need for f/u /w cardiac    Heart murmur    MVP- echo- 2010, no symptoms    Memory disorder 09/19/2017   Mononucleosis 1971   Osteopenia after menopause    Ovarian mass, left 12/12/2022   See MRI 12/12/22   Sleep apnea    cpap    Past Surgical History:  Procedure Laterality Date   ANTERIOR CERVICAL DECOMP/DISCECTOMY FUSION N/A 05/20/2013   Procedure: ANTERIOR CERVICAL DECOMPRESSION/DISCECTOMY FUSION 2 LEVELS;  Surgeon: Maeola Harman, MD;  Location: [facility];  Service: Neurosurgery;  Laterality: N/A;  Cervical Seven-Thoracic One, Thoracic One-Two Anterior cervical decompression/Diskectomy/Fusion   CARPAL TUNNEL RELEASE Right    CATARACT EXTRACTION, BILATERAL  10/01/2011   Dr Nile Riggs, Lacretia Nicks IOL   CERVICAL FUSION  2014   3 proceduresr Venetia Maxon, 2007, 20012 and 2014   COLONOSCOPY  2018   SA- TA   colonoscopy with polypectomy  10/01/2011   Dr Arlyce Dice   KNEE SURGERY     Dr Meade Maw ; bursa cystectomy   LUMBAR DISC SURGERY  2010   Dr.Stern and 1999   ROBOTIC ASSISTED TOTAL HYSTERECTOMY WITH BILATERAL SALPINGO OOPHERECTOMY Bilateral 01/28/2023   Procedure: XI ROBOTIC ASSISTED TOTAL HYSTERECTOMY WITH BILATERAL SALPINGO OOPHORECTOMY;  Surgeon: Clide Cliff, MD;  Location: [facility];  Service: Gynecology;  Laterality: Bilateral;  ROTATOR CUFF REPAIR     Dr.Wainer   TOTAL HIP ARTHROPLASTY Right 09/04/2021   Procedure: TOTAL HIP ARTHROPLASTY ANTERIOR APPROACH;  Surgeon: Sheral Apley, MD;  Location: [facility];  Service: Orthopedics;  Laterality: Right;   UMBILICAL HERNIA REPAIR  1952   as a baby   WRIST FRACTURE SURGERY Left 02/28/2013   Dr. Mckinley Jewel    Family History  Problem  Relation Age of Onset   Breast cancer Mother        estorgen receptor positive   Stroke Mother 31       Dec   Diabetes Father    Coronary artery disease Father        S/P CBAG , no MI -- Dec   Depression Father    Diverticulosis Father    Breast cancer Sister        estrogen receptor positive   Depression Sister    Depression Sister    Depression Sister    Depression Sister    Colon polyps Sister    Depression Brother    Colon cancer Maternal Grandfather    Bipolar disorder Other        Neice   Esophageal cancer Neg Hx    Pancreatic cancer Neg Hx    Rectal cancer Neg Hx    Stomach cancer Neg Hx    Ovarian cancer Neg Hx    Endometrial cancer Neg Hx     Social History   Socioeconomic History   Marital status: Married    Spouse name: Not on file   Number of children: Not on file   Years of education: 16+   Highest education level: Master's degree (e.g., MA, MS, MEng, MEd, MSW, MBA)  Occupational History   Occupation: Runner, broadcasting/film/video- Retired  Tobacco Use   Smoking status: Never   Smokeless tobacco: Never  Vaping Use   Vaping Use: Never used  Substance and Sexual Activity   Alcohol use: Never   Drug use: Never   Sexual activity: Not Currently    Partners: Male    Birth control/protection: Post-menopausal  Other Topics Concern   Not on file  Social History Narrative   Lives   Caffeine use:    Right handed    Social Determinants of Health   Financial Resource Strain: Low Risk  (11/10/2022)   Overall Financial Resource Strain (CARDIA)    Difficulty of Paying Living Expenses: Not hard at all  Food Insecurity: No Food Insecurity (12/16/2022)   Hunger Vital Sign    Worried About Running Out of Food in the Last Year: Never true    Ran Out of Food in the Last Year: Never true  Transportation Needs: No Transportation Needs (12/16/2022)   PRAPARE - Administrator, Civil Service (Medical): No    Lack of Transportation (Non-Medical): No  Physical Activity:  Insufficiently Active (11/10/2022)   Exercise Vital Sign    Days of Exercise per Week: 3 days    Minutes of Exercise per Session: 30 min  Stress: Patient Declined (11/10/2022)   [employer] of Occupational Health - Occupational Stress Questionnaire    Feeling of Stress : Patient declined  Social Connections: Socially Integrated (11/10/2022)   Social Connection and Isolation Panel [NHANES]    Frequency of Communication with Friends and Family: More than three times a week    Frequency of Social Gatherings with Friends and Family: Patient declined    Attends Religious Services: More than 4 times per year  Active Member of Clubs or Organizations: Yes    Attends Banker Meetings: More than 4 times per year    Marital Status: Married    Current Medications:  Current Outpatient Medications:    acetaminophen (TYLENOL) 650 MG CR tablet, Take 1,300 mg by mouth every 8 (eight) hours as needed for pain., Disp: , Rfl:    Bacillus Coagulans-Inulin (ALIGN PREBIOTIC-PROBIOTIC PO), Take by mouth., Disp: , Rfl:    Biotin 1000 MCG tablet, Take 1,000 mcg by mouth daily., Disp: , Rfl:    buPROPion (WELLBUTRIN XL) 300 MG 24 hr tablet, Take 1 tablet (300 mg total) by mouth daily., Disp: 90 tablet, Rfl: 3   Calcium Carb-Cholecalciferol (CALCIUM 600 + D PO), Take 1 tablet by mouth daily., Disp: , Rfl:    Cholecalciferol (VITAMIN D) 2000 UNITS CAPS, Take 2,000 Units by mouth daily., Disp: , Rfl:    Cyanocobalamin (B-12) 1000 MCG CAPS, Take 1,000 mcg by mouth daily., Disp: , Rfl:    denosumab (PROLIA) 60 MG/ML SOSY injection, Inject 60 mg into the skin every 6 (six) months. At MD office, last done 03/2022, Disp: , Rfl:    diclofenac sodium (VOLTAREN) 1 % GEL, Apply 2 g topically 4 (four) times daily. (Patient taking differently: Apply 2 g topically 4 (four) times daily as needed (pain).), Disp: 3 Tube, Rfl: 3   FLUoxetine (PROZAC) 20 MG tablet, Take 1 tablet (20 mg total) by mouth daily., Disp:  90 tablet, Rfl: 3   Multiple Vitamin (MULTIVITAMIN WITH MINERALS) TABS tablet, Take 1 tablet by mouth daily., Disp: , Rfl:    omeprazole (PRILOSEC) 40 MG capsule, TAKE 1 CAPSULE (40 MG TOTAL) BY MOUTH DAILY., Disp: 30 capsule, Rfl: 3  Review of Symptoms: Complete 10-system review is positive for: Vaginal discharge  Physical Exam: BP 135/69 (BP Location: Left Arm, Patient Position: Sitting)   Pulse 82   Temp 98.3 F (36.8 C)   Resp 16   Ht 4' 10.66" (1.49 m)   Wt 118 lb (53.5 kg)   LMP 10/01/1995 (Approximate)   SpO2 99%   BMI 24.11 kg/m  General: Alert, oriented, no acute distress. HEENT: Normocephalic, atraumatic. Neck symmetric without masses. Sclera anicteric.  Chest: Normal work of breathing. Clear to auscultation bilaterally.   Cardiovascular: Regular rate and rhythm, no murmurs. Abdomen: Soft, nontender.  Normoactive bowel sounds.  No masses appreciated.  Well-healing incisions. Extremities: Grossly normal range of motion.  Warm, well perfused.  No edema bilaterally. Skin: No visible rashes or lesions noted. GU: Normal appearing external genitalia without erythema, excoriation, or lesions.  Speculum exam reveals intact vaginal cuff with mild atrophic changes.  Bimanual exam reveals intact vaginal cuff without tenderness or fluctuance. Exam chaperoned by Nelly Laurence, LPN  Laboratory & Radiologic Studies: FINAL MICROSCOPIC DIAGNOSIS:   A. UTERUS, CERVIX, BILATERAL SALPINGO OOPHORECTOMY:  Left ovary: Fibroma (6.3 cm).  Negative for malignancy.  Right ovary: Fibroma (3.7 cm). Negative for malignancy.   Ectocervix: Unremarkable. Negative for intraepithelial lesion or malignancy.   Endocervix: Nabothian cysts and tunnel clusters. Negative for hyperplasia, atypia or malignancy.   Endometrium: Inactive endometrium. Negative for hyperplasia, atypia or malignancy.   Serosa: Unremarkable. Negative for malignancy.   Bilateral fallopian tubes: Benign fimbriated fallopian tubes with  paratubal cysts. Negative for malignancy.   FINAL MICROSCOPIC DIAGNOSIS:  - No malignant cells identified

## 2023-02-26 NOTE — Patient Instructions (Signed)
The incision at the top of the vagina is healing well. The discharge should continue to lessen and resolve. I do not feel vagina estrogen is needed at this time. Please let us know if you are still having discharge or you have a change in the discharge over the next several weeks.  Continue with no lifting >10lbs through 6 weeks postoperatively.  Nothing in the vagina for 10-12 weeks esp if you are still having the discharge.   Plan to follow up with your regular providers including your gynecologists.   Please call our office for any needs, concerns, or new symptoms at [phone]

## 2023-03-06 ENCOUNTER — Other Ambulatory Visit: Payer: Self-pay | Admitting: Family Medicine

## 2023-03-06 DIAGNOSIS — Z0110 Encounter for examination of ears and hearing without abnormal findings: Secondary | ICD-10-CM | POA: Diagnosis not present

## 2023-03-06 DIAGNOSIS — Z974 Presence of external hearing-aid: Secondary | ICD-10-CM | POA: Diagnosis not present

## 2023-03-06 DIAGNOSIS — Z822 Family history of deafness and hearing loss: Secondary | ICD-10-CM | POA: Diagnosis not present

## 2023-03-06 DIAGNOSIS — K219 Gastro-esophageal reflux disease without esophagitis: Secondary | ICD-10-CM

## 2023-03-06 DIAGNOSIS — H903 Sensorineural hearing loss, bilateral: Secondary | ICD-10-CM | POA: Diagnosis not present

## 2023-03-24 DIAGNOSIS — L218 Other seborrheic dermatitis: Secondary | ICD-10-CM | POA: Diagnosis not present

## 2023-03-24 DIAGNOSIS — L820 Inflamed seborrheic keratosis: Secondary | ICD-10-CM | POA: Diagnosis not present

## 2023-03-24 DIAGNOSIS — M7130 Other bursal cyst, unspecified site: Secondary | ICD-10-CM | POA: Diagnosis not present

## 2023-04-07 NOTE — Telephone Encounter (Signed)
Prolia VOB initiated via [email or web address]  Next Prolia inj DUE: 05/14/23

## 2023-04-24 ENCOUNTER — Telehealth (INDEPENDENT_AMBULATORY_CARE_PROVIDER_SITE_OTHER): Payer: Medicare PPO | Admitting: Family Medicine

## 2023-04-24 ENCOUNTER — Encounter: Payer: Self-pay | Admitting: Family Medicine

## 2023-04-24 VITALS — Ht <= 58 in | Wt 117.0 lb

## 2023-04-24 DIAGNOSIS — Z0000 Encounter for general adult medical examination without abnormal findings: Secondary | ICD-10-CM

## 2023-04-24 NOTE — Patient Instructions (Signed)
I really enjoyed getting to talk with you today! I am available on Tuesdays and Thursdays for virtual visits if you have any questions or concerns, or if I can be of any further assistance.   CHECKLIST FROM ANNUAL WELLNESS VISIT:  -Follow up (please call to schedule if not scheduled after visit):   -yearly for annual wellness visit with primary care office  Here is a list of your preventive care/health maintenance measures and the plan for each if any are due:  PLAN For any measures below that may be due:   Health Maintenance  Topic Date Due   Pneumonia Vaccine 23+ Years old (2 of 2 - PPSV23 or PCV20) 02/05/2019   COVID-19 Vaccine (7 - 2023-24 season) 12/31/2022   INFLUENZA VACCINE  05/01/2023   Medicare Annual Wellness (AWV)  04/23/2024   MAMMOGRAM  06/06/2024   Colonoscopy  07/09/2027   DTaP/Tdap/Td (3 - Td or Tdap) 06/13/2029   DEXA SCAN  Completed   Hepatitis C Screening  Completed   Zoster Vaccines- Shingrix  Completed   HPV VACCINES  Aged Out    -See a dentist at least yearly  -Get your eyes checked and then per your eye specialist's recommendations  -Other issues addressed today:   -I have included below further information regarding a healthy whole foods based diet, physical activity guidelines for adults, stress management and opportunities for social connections. I hope you find this information useful.   -----------------------------------------------------------------------------------------------------------------------------------------------------------------------------------------------------------------------------------------------------------  NUTRITION: -eat real food: lots of colorful vegetables (half the plate) and fruits -5-7 servings of vegetables and fruits per day (fresh or steamed is best), exp. 2 servings of vegetables with lunch and dinner and 2 servings of fruit per day. Berries and greens such as kale and collards are great choices.  -consume  on a regular basis: whole grains (make sure first ingredient on label contains the word "whole"), fresh fruits, fish, nuts, seeds, healthy oils (such as olive oil, avocado oil, grape seed oil) -may eat small amounts of dairy and lean meat on occasion, but avoid processed meats such as ham, bacon, lunch meat, etc. -drink water -try to avoid fast food and pre-packaged foods, processed meat -most experts advise limiting sodium to < 2300mg  per day, should limit further is any chronic conditions such as high blood pressure, heart disease, diabetes, etc. The American Heart Association advised that < 1500mg  is is ideal -try to avoid foods that contain any ingredients with names you do not recognize  -try to avoid sugar/sweets (except for the natural sugar that occurs in fresh fruit) -try to avoid sweet drinks -try to avoid white rice, white bread, pasta (unless whole grain), white or yellow potatoes  EXERCISE GUIDELINES FOR ADULTS: -if you wish to increase your physical activity, do so gradually and with the approval of your doctor -STOP and seek medical care immediately if you have any chest pain, chest discomfort or trouble breathing when starting or increasing exercise  -move and stretch your body, legs, feet and arms when sitting for long periods -Physical activity guidelines for optimal health in adults: -least 150 minutes per week of aerobic exercise (can talk, but not sing) once approved by your doctor, 20-30 minutes of sustained activity or two 10 minute episodes of sustained activity every day.  -resistance training at least 2 days per week if approved by your doctor -balance exercises 3+ days per week:   Stand somewhere where you have something sturdy to hold onto if you lose balance.    1)  lift up on toes, start with 5x per day and work up to 20x   2) stand and lift on leg straight out to the side so that foot is a few inches of the floor, start with 5x each side and work up to 20x each  side   3) stand on one foot, start with 5 seconds each side and work up to 20 seconds on each side  If you need ideas or help with getting more active:  -Silver sneakers https://tools.silversneakers.com  -Walk with a Doc: http://www.duncan-[email or web address]/  -try to include resistance (weight lifting/strength building) and balance exercises twice per week: or the following link for ideas: http://castillo-powell.com/  BuyDucts.dk  STRESS MANAGEMENT: -can try meditating, or just sitting quietly with deep breathing while intentionally relaxing all parts of your body for 5 minutes daily -if you need further help with stress, anxiety or depression please follow up with your primary doctor or contact the wonderful folks at [facility]: [phone]  SOCIAL CONNECTIONS: -options in [place] if you wish to engage in more social and exercise related activities:  -Silver sneakers https://tools.silversneakers.com  -Walk with a Doc: http://www.duncan-[email or web address]/  -Check out the [facility] Active Adults 50+ section on the [place] [employer] (hiking clubs, book clubs, cards and games, chess, exercise classes, aquatic classes and much more) - see the website for details: https://www.[place].gov/departments/parks-recreation/active-adults50  -YouTube has lots of exercise videos for different ages and abilities as well  -[facility] (a variety of indoor and outdoor inperson activities for adults). [phone]. [address].  -Virtual Online Classes (a variety of topics): see seniorplanet.org or call [phone]  -consider volunteering at a school, hospice center, church, senior center or elsewhere

## 2023-04-24 NOTE — Progress Notes (Signed)
PATIENT CHECK-IN and HEALTH RISK ASSESSMENT QUESTIONNAIRE:  -completed by phone/video for upcoming Medicare Preventive Visit  Pre-Visit Check-in: 1)Vitals (height, wt, BP, etc) - record in vitals section for visit on day of visit 2)Review and Update Medications, Allergies PMH, Surgeries, Social history in Epic 3)Hospitalizations in the last year with date/reason? Yes Hysterectomy   4)Review and Update Care Team (patient's specialists) in Epic 5) Complete PHQ9 in Epic  6) Complete Fall Screening in Epic 7)Review all Health Maintenance Due and order under PCP if not done.  8)Medicare Wellness Questionnaire: Answer theses question about your habits: Do you drink alcohol? No If yes, how many drinks do you have a day? Have you ever smoked?No Quit date if applicable?   How many packs a day do/did you smoke?  Do you use smokeless tobacco?No Do you use an illicit drugs?No Do you exercises? Yes IF so, what type and how many days/minutes per week?3 days 30 min - walking  Are you sexually active? No Number of partners? Feels diet is ok - but sometimes eats junk Typical breakfast: Varies  Typical lunch: Eggs Typical dinner: Varies  Typical snacks: Popcorn  Beverages: Water, Soda   Answer theses question about you: Can you perform most household chores? Yes Do you find it hard to follow a conversation in a noisy room? Yes Do you often ask people to speak up or repeat themselves? Yes Do you feel that you have a problem with memory? No Do you balance your checkbook and or bank acounts? Yes Do you feel safe at home?Yes Last dentist visit? 6 months ago  Do you need assistance with any of the following: Please note if so No  Driving?  Feeding yourself?  Getting from bed to chair?  Getting to the toilet?  Bathing or showering?  Dressing yourself?  Managing money?  Climbing a flight of stairs  Preparing meals?  Do you have Advanced Directives in place (Living Will, Healthcare Power or  Attorney)? Yes   Last eye Exam and location?6 months ago Eye care in [place]    Do you currently use prescribed or non-prescribed narcotic or opioid pain medications? No  Do you have a history or close family history of breast, ovarian, tubal or peritoneal cancer or a family member with BRCA (breast cancer susceptibility 1 and 2) gene mutations? Yes- Mother and sister   Nurse/Assistant Credentials/time stamp: Mg 10:59   ----------------------------------------------------------------------------------------------------------------------------------------------------------------------------------------------------------------------   MEDICARE ANNUAL PREVENTIVE VISIT WITH PROVIDER: (Welcome to [employer], initial annual wellness or annual wellness exam)  Virtual Visit via  Note  I connected with Jessica Casey on 04/24/23 by a video enabled telemedicine application and verified that I am speaking with the correct person using two identifiers.  Location patient: home Location provider:work or home office Persons participating in the virtual visit: patient, provider  Concerns and/or follow up today: doing good.    See HM section in Epic for other details of completed HM.    ROS: negative for report of fevers, unintentional weight loss, vision changes, vision loss, hearing loss or change, chest pain, sob, hemoptysis, melena, hematochezia, hematuria, falls, bleeding or bruising, thoughts of suicide or self harm, memory loss  Patient-completed extensive health risk assessment - reviewed and discussed with the patient: See Health Risk Assessment completed with patient prior to the visit either above or in recent phone note. This was reviewed in detailed with the patient today and appropriate recommendations, orders and referrals were placed as needed per Summary below and patient instructions.  Review of Medical History: -PMH, PSH, Family History and current specialty and care  providers reviewed and updated and listed below   Patient Care Team: [place], [name], MD as PCP - General (Family Medicine) Ardell Isaacs, Forrestine Him, MD as Consulting Physician (Obstetrics and Gynecology)   Past Medical History:  Diagnosis Date   Cataract    bilateral removed   Colonic polyp 2005 & 2013   DDD (degenerative disc disease)    Depression    Diverticulitis large intestine 12/12/2022   GERD (gastroesophageal reflux disease)    H/O echocardiogram    before 2000, no need for f/u /w cardiac    Heart murmur    MVP- echo- 2010, no symptoms    Memory disorder 09/19/2017   Mononucleosis 1971   Osteopenia after menopause    Ovarian mass, left 12/12/2022   See MRI 12/12/22   Sleep apnea    cpap    Past Surgical History:  Procedure Laterality Date   ANTERIOR CERVICAL DECOMP/DISCECTOMY FUSION N/A 05/20/2013   Procedure: ANTERIOR CERVICAL DECOMPRESSION/DISCECTOMY FUSION 2 LEVELS;  Surgeon: Maeola Harman, MD;  Location: [facility];  Service: Neurosurgery;  Laterality: N/A;  Cervical Seven-Thoracic One, Thoracic One-Two Anterior cervical decompression/Diskectomy/Fusion   CARPAL TUNNEL RELEASE Right    CATARACT EXTRACTION, BILATERAL  10/01/2011   Dr Nile Riggs, Lacretia Nicks IOL   CERVICAL FUSION  2014   3 proceduresr Venetia Maxon, 2007, 20012 and 2014   COLONOSCOPY  2018   SA- TA   colonoscopy with polypectomy  10/01/2011   Dr Arlyce Dice   KNEE SURGERY     Dr Meade Maw ; bursa cystectomy   LUMBAR DISC SURGERY  2010   Dr.Stern and 1999   ROBOTIC ASSISTED TOTAL HYSTERECTOMY WITH BILATERAL SALPINGO OOPHERECTOMY Bilateral 01/28/2023   Procedure: XI ROBOTIC ASSISTED TOTAL HYSTERECTOMY WITH BILATERAL SALPINGO OOPHORECTOMY;  Surgeon: Clide Cliff, MD;  Location: [facility];  Service: Gynecology;  Laterality: Bilateral;   ROTATOR CUFF REPAIR     Dr.Wainer   TOTAL HIP ARTHROPLASTY Right 09/04/2021   Procedure: TOTAL HIP ARTHROPLASTY ANTERIOR APPROACH;  Surgeon: Sheral Apley, MD;  Location: [facility];   Service: Orthopedics;  Laterality: Right;   UMBILICAL HERNIA REPAIR  1952   as a baby   WRIST FRACTURE SURGERY Left 02/28/2013   Dr. Mckinley Jewel    Social History   Socioeconomic History   Marital status: Married    Spouse name: Not on file   Number of children: Not on file   Years of education: 16+   Highest education level: Master's degree (e.g., MA, MS, MEng, MEd, MSW, MBA)  Occupational History   Occupation: Runner, broadcasting/film/video- Retired  Tobacco Use   Smoking status: Never   Smokeless tobacco: Never  Vaping Use   Vaping status: Never Used  Substance and Sexual Activity   Alcohol use: Never   Drug use: Never   Sexual activity: Not Currently    Partners: Male    Birth control/protection: Post-menopausal  Other Topics Concern   Not on file  Social History Narrative   Lives   Caffeine use:    Right handed    Social Determinants of Health   Financial Resource Strain: Low Risk  (11/10/2022)   Overall Financial Resource Strain (CARDIA)    Difficulty of Paying Living Expenses: Not hard at all  Food Insecurity: No Food Insecurity (12/16/2022)   Hunger Vital Sign    Worried About Running Out of Food in the Last Year: Never true    Ran Out of  Food in the Last Year: Never true  Transportation Needs: No Transportation Needs (12/16/2022)   PRAPARE - Administrator, Civil Service (Medical): No    Lack of Transportation (Non-Medical): No  Physical Activity: Insufficiently Active (11/10/2022)   Exercise Vital Sign    Days of Exercise per Week: 3 days    Minutes of Exercise per Session: 30 min  Stress: Patient Declined (11/10/2022)   [employer] of Occupational Health - Occupational Stress Questionnaire    Feeling of Stress : Patient declined  Social Connections: Socially Integrated (11/10/2022)   Social Connection and Isolation Panel [NHANES]    Frequency of Communication with Friends and Family: More than three times a week    Frequency of Social Gatherings with Friends  and Family: Patient declined    Attends Religious Services: More than 4 times per year    Active Member of Clubs or Organizations: Yes    Attends Banker Meetings: More than 4 times per year    Marital Status: Married  Catering manager Violence: Not At Risk (12/16/2022)   Humiliation, Afraid, Rape, and Kick questionnaire    Fear of Current or Ex-Partner: No    Emotionally Abused: No    Physically Abused: No    Sexually Abused: No    Family History  Problem Relation Age of Onset   Breast cancer Mother        estorgen receptor positive   Stroke Mother 74       Dec   Diabetes Father    Coronary artery disease Father        S/P CBAG , no MI -- Dec   Depression Father    Diverticulosis Father    Breast cancer Sister        estrogen receptor positive   Depression Sister    Depression Sister    Depression Sister    Depression Sister    Colon polyps Sister    Depression Brother    Colon cancer Maternal Grandfather    Bipolar disorder Other        Neice   Esophageal cancer Neg Hx    Pancreatic cancer Neg Hx    Rectal cancer Neg Hx    Stomach cancer Neg Hx    Ovarian cancer Neg Hx    Endometrial cancer Neg Hx     Current Outpatient Medications on File Prior to Visit  Medication Sig Dispense Refill   acetaminophen (TYLENOL) 650 MG CR tablet Take 1,300 mg by mouth every 8 (eight) hours as needed for pain.     Bacillus Coagulans-Inulin (ALIGN PREBIOTIC-PROBIOTIC PO) Take by mouth.     Biotin 1000 MCG tablet Take 1,000 mcg by mouth daily.     buPROPion (WELLBUTRIN XL) 300 MG 24 hr tablet Take 1 tablet (300 mg total) by mouth daily. 90 tablet 3   Calcium Carb-Cholecalciferol (CALCIUM 600 + D PO) Take 1 tablet by mouth daily.     Cholecalciferol (VITAMIN D) 2000 UNITS CAPS Take 2,000 Units by mouth daily.     Cyanocobalamin (B-12) 1000 MCG CAPS Take 1,000 mcg by mouth daily.     denosumab (PROLIA) 60 MG/ML SOSY injection Inject 60 mg into the skin every 6 (six) months.  At MD office, last done 03/2022     diclofenac sodium (VOLTAREN) 1 % GEL Apply 2 g topically 4 (four) times daily. (Patient taking differently: Apply 2 g topically 4 (four) times daily as needed (pain).) 3 Tube 3   FLUoxetine (PROZAC)  20 MG tablet Take 1 tablet (20 mg total) by mouth daily. 90 tablet 3   Multiple Vitamin (MULTIVITAMIN WITH MINERALS) TABS tablet Take 1 tablet by mouth daily.     omeprazole (PRILOSEC) 40 MG capsule TAKE 1 CAPSULE (40 MG TOTAL) BY MOUTH DAILY. 90 capsule 1   No current facility-administered medications on file prior to visit.    Allergies  Allergen Reactions   Gabapentin     Tongue swelling and itching        Physical Exam Vitals requested from patient and listed below if patient had equipment and was able to obtain at home for this virtual visit: There were no vitals filed for this visit. Estimated body mass index is 24.45 kg/m as calculated from the following:   Height as of this encounter: 4\' 10"  (1.473 m).   Weight as of this encounter: 117 lb (53.1 kg).  EKG (optional): deferred due to virtual visit  GENERAL: alert, oriented, no acute distress detected, full vision exam deferred due to pandemic and/or virtual encounter  HEENT: atraumatic, conjunttiva clear, no obvious abnormalities on inspection of external nose and ears  NECK: normal movements of the head and neck  LUNGS: on inspection no signs of respiratory distress, breathing rate appears normal, no obvious gross SOB, gasping or wheezing  CV: no obvious cyanosis  MS: moves all visible extremities without noticeable abnormality  PSYCH/NEURO: pleasant and cooperative, no obvious depression or anxiety, speech and thought processing grossly intact, Cognitive function grossly intact  Flowsheet Row Video Visit from 04/24/2023 in [facility] [place]  PHQ-9 Total Score 1           04/24/2023   10:51 AM 12/20/2022   12:29 PM 12/16/2022    8:58 AM 11/11/2022   10:59  AM 12/10/2021    1:03 PM  Depression screen PHQ 2/9  Decreased Interest 0 0 0 0 0  Down, Depressed, Hopeless 0 0 0 0 0  PHQ - 2 Score 0 0 0 0 0  Altered sleeping 1 3  3    Tired, decreased energy 0 3  1   Change in appetite 0 3  1   Feeling bad or failure about yourself  0 0  0   Trouble concentrating 0 3  0   Moving slowly or fidgety/restless 0 0  0   Suicidal thoughts 0 0  0   PHQ-9 Score 1 12  5    Difficult doing work/chores Not difficult at all Not difficult at all  Not difficult at all        12/10/2021    1:02 PM 11/11/2022   10:58 AM 12/16/2022    9:42 AM 12/20/2022   12:29 PM 04/24/2023   10:53 AM  Fall Risk  Falls in the past year? 0 0 0 0 0  Was there an injury with Fall?  0  0 0  Fall Risk Category Calculator  0  0 0  (RETIRED) Patient Fall Risk Level Low fall risk      Patient at Risk for Falls Due to Medication side effect Other (Comment)  Other (Comment) No Fall Risks  Fall risk Follow up Falls evaluation completed;Education provided;Falls prevention discussed Falls evaluation completed  Falls evaluation completed Falls evaluation completed     SUMMARY AND PLAN:  Encounter for Medicare annual wellness exam   Discussed applicable health maintenance/preventive health measures and advised and referred or ordered per patient preferences: -reports had her last covid booster in January, discussed getting  updated boosters in the fall -considering the pneumonia vaccine, knows can get at pharmacy Health Maintenance  Topic Date Due   Pneumonia Vaccine 48+ Years old (2 of 2 - PPSV23 or PCV20) 02/05/2019   COVID-19 Vaccine (7 - 2023-24 season) 12/31/2022   INFLUENZA VACCINE  05/01/2023   Medicare Annual Wellness (AWV)  04/23/2024   MAMMOGRAM  06/06/2024   Colonoscopy  07/09/2027   DTaP/Tdap/Td (3 - Td or Tdap) 06/13/2029   DEXA SCAN  Completed   Hepatitis C Screening  Completed   Zoster Vaccines- Shingrix  Completed   HPV VACCINES  Aged [employer] and  counseling on the following was provided based on the above review of health and a plan/checklist for the patient, along with additional information discussed, was provided for the patient in the patient instructions :  -Provided safe balance exercises that can be done at home to improve balance (see pt instructions) and discussed exercise guidelines for adults with include balance exercises at least 3 days per week.  -Advised and counseled on a healthy lifestyle - including the importance of a healthy diet, regular physical activity -Reviewed patient's current diet. Advised and counseled on a whole foods based healthy diet. A summary of a healthy diet was provided in the Patient Instructions.  -reviewed patient's current physical activity level and discussed exercise guidelines for adults. Discussed community resources and ideas for safe exercise at home to assist in meeting exercise guideline recommendations in a safe and healthy way. Goal to add some strength building exercises at least 2 days per week and meet goal of 150 minutes per week of exercise. -Advise yearly dental visits at minimum and regular eye exams   Follow up: see patient instructions     Patient Instructions  I really enjoyed getting to talk with you today! I am available on Tuesdays and Thursdays for virtual visits if you have any questions or concerns, or if I can be of any further assistance.   CHECKLIST FROM ANNUAL WELLNESS VISIT:  -Follow up (please call to schedule if not scheduled after visit):   -yearly for annual wellness visit with primary care office  Here is a list of your preventive care/health maintenance measures and the plan for each if any are due:  PLAN For any measures below that may be due:   Health Maintenance  Topic Date Due   Pneumonia Vaccine 41+ Years old (2 of 2 - PPSV23 or PCV20) 02/05/2019   COVID-19 Vaccine (7 - 2023-24 season) 12/31/2022   INFLUENZA VACCINE  05/01/2023   Medicare Annual  Wellness (AWV)  04/23/2024   MAMMOGRAM  06/06/2024   Colonoscopy  07/09/2027   DTaP/Tdap/Td (3 - Td or Tdap) 06/13/2029   DEXA SCAN  Completed   Hepatitis C Screening  Completed   Zoster Vaccines- Shingrix  Completed   HPV VACCINES  Aged Out    -See a dentist at least yearly  -Get your eyes checked and then per your eye specialist's recommendations  -Other issues addressed today:   -I have included below further information regarding a healthy whole foods based diet, physical activity guidelines for adults, stress management and opportunities for social connections. I hope you find this information useful.   -----------------------------------------------------------------------------------------------------------------------------------------------------------------------------------------------------------------------------------------------------------  NUTRITION: -eat real food: lots of colorful vegetables (half the plate) and fruits -5-7 servings of vegetables and fruits per day (fresh or steamed is best), exp. 2 servings of vegetables with lunch and dinner and 2 servings of fruit per day. Berries  and greens such as kale and collards are great choices.  -consume on a regular basis: whole grains (make sure first ingredient on label contains the word "whole"), fresh fruits, fish, nuts, seeds, healthy oils (such as olive oil, avocado oil, grape seed oil) -may eat small amounts of dairy and lean meat on occasion, but avoid processed meats such as ham, bacon, lunch meat, etc. -drink water -try to avoid fast food and pre-packaged foods, processed meat -most experts advise limiting sodium to < 2300mg  per day, should limit further is any chronic conditions such as high blood pressure, heart disease, diabetes, etc. The American Heart Association advised that < 1500mg  is is ideal -try to avoid foods that contain any ingredients with names you do not recognize  -try to avoid sugar/sweets  (except for the natural sugar that occurs in fresh fruit) -try to avoid sweet drinks -try to avoid white rice, white bread, pasta (unless whole grain), white or yellow potatoes  EXERCISE GUIDELINES FOR ADULTS: -if you wish to increase your physical activity, do so gradually and with the approval of your doctor -STOP and seek medical care immediately if you have any chest pain, chest discomfort or trouble breathing when starting or increasing exercise  -move and stretch your body, legs, feet and arms when sitting for long periods -Physical activity guidelines for optimal health in adults: -least 150 minutes per week of aerobic exercise (can talk, but not sing) once approved by your doctor, 20-30 minutes of sustained activity or two 10 minute episodes of sustained activity every day.  -resistance training at least 2 days per week if approved by your doctor -balance exercises 3+ days per week:   Stand somewhere where you have something sturdy to hold onto if you lose balance.    1) lift up on toes, start with 5x per day and work up to 20x   2) stand and lift on leg straight out to the side so that foot is a few inches of the floor, start with 5x each side and work up to 20x each side   3) stand on one foot, start with 5 seconds each side and work up to 20 seconds on each side  If you need ideas or help with getting more active:  -Silver sneakers https://tools.silversneakers.com  -Walk with a Doc: http://www.duncan-williams.com/  -try to include resistance (weight lifting/strength building) and balance exercises twice per week: or the following link for ideas: http://castillo-powell.com/  BuyDucts.dk  STRESS MANAGEMENT: -can try meditating, or just sitting quietly with deep breathing while intentionally relaxing all parts of your body for 5 minutes daily -if you need further help with stress, anxiety or  depression please follow up with your primary doctor or contact the wonderful folks at [facility]: [phone]  SOCIAL CONNECTIONS: -options in [place] if you wish to engage in more social and exercise related activities:  -Silver sneakers https://tools.silversneakers.com  -Walk with a Doc: http://www.duncan-williams.com/  -Check out the [facility] Active Adults 50+ section on the [place] [employer] (hiking clubs, book clubs, cards and games, chess, exercise classes, aquatic classes and much more) - see the website for details: https://www.[place].gov/departments/parks-recreation/active-adults50  -YouTube has lots of exercise videos for different ages and abilities as well  -[facility] (a variety of indoor and outdoor inperson activities for adults). 3342386829. [address]  -Virtual Online Classes (a variety of topics): see seniorplanet.org or call [phone]  -consider volunteering at a school, hospice center, church, senior center or elsewhere  Terressa Koyanagi, DO

## 2023-04-24 NOTE — Progress Notes (Signed)
Patient unable able to obtain vitals due to telehealth visit.

## 2023-04-25 NOTE — Telephone Encounter (Signed)
Check Availity

## 2023-05-01 NOTE — Telephone Encounter (Addendum)
Pt ready for scheduling on or after 05/14/23  Out-of-pocket cost due at time of visit: $0  Primary: [employer] Medicare Adv PPO Prolia co-insurance: 0% Admin fee co-insurance: 0%  Deductible: does not apply  Prior Auth: APPROVED PA# [id number], KEY: [id number] Valid: 10/24/21-09/30/23   Secondary: N/A Prolia co-insurance:  Admin fee co-insurance:  Deductible: Prior Auth:

## 2023-05-02 NOTE — Telephone Encounter (Signed)
LM x1 for patient to call office to schedule next Prolia injection.

## 2023-05-05 NOTE — Telephone Encounter (Signed)
LMx1 to call office to schedule nest Prolia injection.

## 2023-05-17 NOTE — Telephone Encounter (Addendum)
Per visit note with PCP 11/11/22:  Last Assessment & Plan NoteWritten:Jordan, Timoteo Expose, MD2/08/2023 3:11 PM We discussed diagnosis and possible etiologies. She is holding on Prolia for now until she sees or hears from endocrinologist. Following with dentist, oral surgeon, and periodontist.

## 2023-05-28 ENCOUNTER — Encounter: Payer: Self-pay | Admitting: Family Medicine

## 2023-05-28 DIAGNOSIS — R200 Anesthesia of skin: Secondary | ICD-10-CM

## 2023-05-28 NOTE — Telephone Encounter (Signed)
Okay to place referral

## 2023-06-10 ENCOUNTER — Ambulatory Visit: Payer: Medicare PPO | Admitting: Neurology

## 2023-06-10 ENCOUNTER — Encounter: Payer: Self-pay | Admitting: Neurology

## 2023-06-10 VITALS — BP 135/74 | HR 92 | Ht <= 58 in | Wt 118.6 lb

## 2023-06-10 DIAGNOSIS — Z1231 Encounter for screening mammogram for malignant neoplasm of breast: Secondary | ICD-10-CM | POA: Diagnosis not present

## 2023-06-10 DIAGNOSIS — M47816 Spondylosis without myelopathy or radiculopathy, lumbar region: Secondary | ICD-10-CM | POA: Diagnosis not present

## 2023-06-10 DIAGNOSIS — R202 Paresthesia of skin: Secondary | ICD-10-CM | POA: Insufficient documentation

## 2023-06-10 NOTE — Progress Notes (Signed)
Chief Complaint  Patient presents with   New Patient (Initial Visit)    Emg rm4 internal referral for numbness and tingling of left HKV:[id number] since may,  pt also stated left arm goes to sleep when reading books      ASSESSMENT AND PLAN  Jessica Casey is a 72 y.o. female   Known history of lumbar spondylosis, history of decompression surgery in the past New onset intermittent left lower extremity paresthesia since laparoscopic surgery for benign left ovarian cystic lesion on January 28, 2023  Sensory distribution travel along left S1, absent left ankle reflex,  Differentiation diagnosis including left S1 radiculopathy versus left sciatic neuropathy, left lower lumbar sacral plexopathy  She denies significant pain, denied low back pain, does not want neuropathic pain medications,  EMG nerve conduction study   DIAGNOSTIC DATA (LABS, IMAGING, TESTING) - I reviewed patient records, labs, notes, testing and imaging myself where available.   MEDICAL HISTORY:  Jessica Casey is 72 year old female, seen in request by her primary care doctor [place], [name], for evaluation of left lower extremity radiating paresthesia, initial evaluation was June 10, 2023.    I reviewed and summarized the referring note.PMHX Depression, anxiety GERD Sleep apnea, CPAP Cervical decompression surgery in 2014, presenting with neck pain, left cervical radiculopathy CTS release on right Lumbar decompression surgery for low back pain, in 1999, 2010 Right rotator cuff  Right hip replacement Left wrist fracture following fall.  She has significant degenerative joint disease, had multiple cervical and lumbar decompression surgery in the past, recovered very well, denied low back pain  She underwent robotic assisted laparoscopic hysterectomy bilateral salpingoophorectomy January 28, 2023 for left ovarian cyst,  Soon after the surgery when she began to ambulate, she noticed intermittent radiating  paresthesia from the left lateral thigh to calf, to the bottom of her left foot, sometimes happen after prolonged sitting, lasting for few minutes, tingly, occasionally burning pain, initially is happening frequently, reported 50% improvement over the past few months, but still happen intermittently throughout the day  She denies significant gait change, mild gait abnormality due to her mild joint disease, denied low back pain, no bowel or bladder incontinence  PHYSICAL EXAM:   Vitals:   06/10/23 1305  BP: 135/74  Pulse: 92  Weight: 118 lb 9.6 oz (53.8 kg)  Height: 4\' 10"  (1.473 m)   Body mass index is 24.79 kg/m.  PHYSICAL EXAMNIATION:  Gen: NAD, conversant, well nourised, well groomed                     Cardiovascular: Regular rate rhythm, no peripheral edema, warm, nontender. Eyes: Conjunctivae clear without exudates or hemorrhage Neck: Supple, no carotid bruits. Pulmonary: Clear to auscultation bilaterally   NEUROLOGICAL EXAM:  MENTAL STATUS: Speech/cognition: Awake, alert, oriented to history taking and casual conversation CRANIAL NERVES: CN II: Visual fields are full to confrontation. Pupils are round equal and briskly reactive to light. CN III, IV, VI: extraocular movement are normal. No ptosis. CN V: Facial sensation is intact to light touch CN VII: Face is symmetric with normal eye closure  CN VIII: Hearing is normal to causal conversation. CN IX, X: Phonation is normal. CN XI: Head turning and shoulder shrug are intact  MOTOR: There is no pronator drift of out-stretched arms. Muscle bulk and tone are normal. Muscle strength is normal.  REFLEXES: Reflexes are 2+ and symmetric at the biceps, triceps, knees, and ankles  R1/L0. Plantar responses are flexor.  SENSORY:  Intact to light touch, pinprick and vibratory sensation are intact in fingers and toes.  COORDINATION: There is no trunk or limb dysmetria noted.  GAIT/STANCE: Posture is normal. Gait is steady  with normal steps, base, arm swing, and turning. Heel and toe walking are normal. Tandem gait is normal.  Romberg is absent.  REVIEW OF SYSTEMS:  Full 14 system review of systems performed and notable only for as above All other review of systems were negative.   ALLERGIES: Allergies  Allergen Reactions   Gabapentin     Tongue swelling and itching     HOME MEDICATIONS: Current Outpatient Medications  Medication Sig Dispense Refill   acetaminophen (TYLENOL) 650 MG CR tablet Take 1,300 mg by mouth every 8 (eight) hours as needed for pain.     Bacillus Coagulans-Inulin (ALIGN PREBIOTIC-PROBIOTIC PO) Take by mouth.     Biotin 1000 MCG tablet Take 1,000 mcg by mouth daily.     buPROPion (WELLBUTRIN XL) 300 MG 24 hr tablet Take 1 tablet (300 mg total) by mouth daily. 90 tablet 3   Calcium Carb-Cholecalciferol (CALCIUM 600 + D PO) Take 1 tablet by mouth daily.     Cholecalciferol (VITAMIN D) 2000 UNITS CAPS Take 2,000 Units by mouth daily.     Cyanocobalamin (B-12) 1000 MCG CAPS Take 1,000 mcg by mouth daily.     denosumab (PROLIA) 60 MG/ML SOSY injection Inject 60 mg into the skin every 6 (six) months. At MD office, last done 03/2022     diclofenac sodium (VOLTAREN) 1 % GEL Apply 2 g topically 4 (four) times daily. (Patient taking differently: Apply 2 g topically 4 (four) times daily as needed (pain).) 3 Tube 3   FLUoxetine (PROZAC) 20 MG tablet Take 1 tablet (20 mg total) by mouth daily. 90 tablet 3   Multiple Vitamin (MULTIVITAMIN WITH MINERALS) TABS tablet Take 1 tablet by mouth daily.     omeprazole (PRILOSEC) 40 MG capsule TAKE 1 CAPSULE (40 MG TOTAL) BY MOUTH DAILY. 90 capsule 1   No current facility-administered medications for this visit.    PAST MEDICAL HISTORY: Past Medical History:  Diagnosis Date   Cataract    bilateral removed   Colonic polyp 2005 & 2013   DDD (degenerative disc disease)    Depression    Diverticulitis large intestine 12/12/2022   GERD  (gastroesophageal reflux disease)    H/O echocardiogram    before 2000, no need for f/u /w cardiac    Heart murmur    MVP- echo- 2010, no symptoms    Memory disorder 09/19/2017   Mononucleosis 1971   Osteopenia after menopause    Ovarian mass, left 12/12/2022   See MRI 12/12/22   Sleep apnea    cpap    PAST SURGICAL HISTORY: Past Surgical History:  Procedure Laterality Date   ANTERIOR CERVICAL DECOMP/DISCECTOMY FUSION N/A 05/20/2013   Procedure: ANTERIOR CERVICAL DECOMPRESSION/DISCECTOMY FUSION 2 LEVELS;  Surgeon: Maeola Harman, MD;  Location: [facility];  Service: Neurosurgery;  Laterality: N/A;  Cervical Seven-Thoracic One, Thoracic One-Two Anterior cervical decompression/Diskectomy/Fusion   CARPAL TUNNEL RELEASE Right    CATARACT EXTRACTION, BILATERAL  10/01/2011   Dr Nile Riggs, Lacretia Nicks IOL   CERVICAL FUSION  2014   3 proceduresr Venetia Maxon, 2007, 20012 and 2014   COLONOSCOPY  2018   SA- TA   colonoscopy with polypectomy  10/01/2011   Dr Arlyce Dice   KNEE SURGERY     Dr Meade Maw ; bursa cystectomy   LUMBAR DISC SURGERY  2010  Dr.Stern and 1999   ROBOTIC ASSISTED TOTAL HYSTERECTOMY WITH BILATERAL SALPINGO OOPHERECTOMY Bilateral 01/28/2023   Procedure: XI ROBOTIC ASSISTED TOTAL HYSTERECTOMY WITH BILATERAL SALPINGO OOPHORECTOMY;  Surgeon: Clide Cliff, MD;  Location: [facility];  Service: Gynecology;  Laterality: Bilateral;   ROTATOR CUFF REPAIR     Dr.Wainer   TOTAL HIP ARTHROPLASTY Right 09/04/2021   Procedure: TOTAL HIP ARTHROPLASTY ANTERIOR APPROACH;  Surgeon: Sheral Apley, MD;  Location: [facility];  Service: Orthopedics;  Laterality: Right;   UMBILICAL HERNIA REPAIR  1952   as a baby   WRIST FRACTURE SURGERY Left 02/28/2013   Dr. Mckinley Jewel    FAMILY HISTORY: Family History  Problem Relation Age of Onset   Breast cancer Mother        estorgen receptor positive   Stroke Mother 52       Dec   Diabetes Father    Coronary artery disease Father        S/P CBAG , no MI -- Dec    Depression Father    Diverticulosis Father    Breast cancer Sister        estrogen receptor positive   Depression Sister    Depression Sister    Depression Sister    Depression Sister    Colon polyps Sister    Depression Brother    Colon cancer Maternal Grandfather    Bipolar disorder Other        Neice   Esophageal cancer Neg Hx    Pancreatic cancer Neg Hx    Rectal cancer Neg Hx    Stomach cancer Neg Hx    Ovarian cancer Neg Hx    Endometrial cancer Neg Hx     SOCIAL HISTORY: Social History   Socioeconomic History   Marital status: Married    Spouse name: Not on file   Number of children: Not on file   Years of education: 16+   Highest education level: Master's degree (e.g., MA, MS, MEng, MEd, MSW, MBA)  Occupational History   Occupation: Runner, broadcasting/film/video- Retired  Tobacco Use   Smoking status: Never   Smokeless tobacco: Never  Vaping Use   Vaping status: Never Used  Substance and Sexual Activity   Alcohol use: Never   Drug use: Never   Sexual activity: Not Currently    Partners: Male    Birth control/protection: Post-menopausal  Other Topics Concern   Not on file  Social History Narrative   Lives   Caffeine use:    Right handed    Social Determinants of Health   Financial Resource Strain: Low Risk  (11/10/2022)   Overall Financial Resource Strain (CARDIA)    Difficulty of Paying Living Expenses: Not hard at all  Food Insecurity: No Food Insecurity (12/16/2022)   Hunger Vital Sign    Worried About Running Out of Food in the Last Year: Never true    Ran Out of Food in the Last Year: Never true  Transportation Needs: No Transportation Needs (12/16/2022)   PRAPARE - Administrator, Civil Service (Medical): No    Lack of Transportation (Non-Medical): No  Physical Activity: Insufficiently Active (11/10/2022)   Exercise Vital Sign    Days of Exercise per Week: 3 days    Minutes of Exercise per Session: 30 min  Stress: Patient Declined (11/10/2022)    [employer] of Occupational Health - Occupational Stress Questionnaire    Feeling of Stress : Patient declined  Social Connections: Socially Integrated (11/10/2022)   Social Connection  and Isolation Panel [NHANES]    Frequency of Communication with Friends and Family: More than three times a week    Frequency of Social Gatherings with Friends and Family: Patient declined    Attends Religious Services: More than 4 times per year    Active Member of [employer] or Organizations: Yes    Attends Banker Meetings: More than 4 times per year    Marital Status: Married  Catering manager Violence: Not At Risk (12/16/2022)   Humiliation, Afraid, Rape, and Kick questionnaire    Fear of Current or Ex-Partner: No    Emotionally Abused: No    Physically Abused: No    Sexually Abused: No      Levert Feinstein, M.D. Ph.D.  [facility] [address] [place], [place] [address] Ph: [phone] Fax: [phone]  CC:  [place], [name], MD [address] [place],  [place] [address]  [place], [name], MD

## 2023-06-12 ENCOUNTER — Encounter: Payer: Self-pay | Admitting: Obstetrics and Gynecology

## 2023-06-24 DIAGNOSIS — H903 Sensorineural hearing loss, bilateral: Secondary | ICD-10-CM | POA: Diagnosis not present

## 2023-06-27 NOTE — Telephone Encounter (Signed)
Pt > 30 days past due for PROLIA injection  If you would like for pt to continue with Prolia therapy, please have clinical staff reach out to pt for scheduling and to explain to importance of receiving Prolia injections every 6 months as abrupt cessation of Prolia raises risk of osteoporotic fracture.    "Discontinuation of Dmab is associated with a 3- to 5-fold higher risk for vertebral, major osteoporotic, and hip fractures [38,39]."   [email or web address]

## 2023-06-28 ENCOUNTER — Encounter: Payer: Self-pay | Admitting: Family Medicine

## 2023-07-03 ENCOUNTER — Encounter: Payer: Self-pay | Admitting: Internal Medicine

## 2023-07-11 ENCOUNTER — Ambulatory Visit: Payer: Medicare PPO

## 2023-08-01 ENCOUNTER — Ambulatory Visit: Payer: Self-pay | Admitting: Neurology

## 2023-08-01 ENCOUNTER — Ambulatory Visit (INDEPENDENT_AMBULATORY_CARE_PROVIDER_SITE_OTHER): Payer: Medicare PPO | Admitting: Neurology

## 2023-08-01 VITALS — BP 160/76 | HR 84 | Ht <= 58 in | Wt 118.0 lb

## 2023-08-01 DIAGNOSIS — R202 Paresthesia of skin: Secondary | ICD-10-CM | POA: Diagnosis not present

## 2023-08-01 DIAGNOSIS — Z0289 Encounter for other administrative examinations: Secondary | ICD-10-CM

## 2023-08-01 NOTE — Procedures (Signed)
Full Name: Jessica Casey Gender: Female MRN #: [id number] Date of Birth: 09-04-1951    Visit Date: 08/01/2023 08:37 Age: 72 Years Examining Physician: Dr. Levert Feinstein Referring Physician: Dr. Levert Feinstein Height: 4 feet 10 inch History: 72 year old female with history of multiple lumbar decompression surgery in the past, complains of radiating paresthesia along left posterior thigh and calf  Summary of the test:  Nerve conduction study: Bilateral sural, superficial peroneal sensory responses were normal.  Left median sensory responses showed moderately prolonged peak latency with normal snap amplitude, left ulnar sensory responses were normal. Bilateral tibial, peroneal to EDB, left median and ulnar motor responses were normal.  Bilateral tibial H reflexes were present and symmetric  Electromyography: Selected needle examination of the lateral lower extremity muscles and lumbosacral paraspinal muscles were normal.   Conclusion: This is a slight normal study.  There is electrodiagnostic evidence of mild left carpal tunnel syndromes.  There is no evidence of large fiber peripheral neuropathy or active lumbosacral radiculopathy.    ------------------------------- Levert Feinstein M.D. Ph.D  [facility] [address], [address] [place], [place] [address] Tel: [phone] Fax: [phone]  Verbal informed consent was obtained from the patient, patient was informed of potential risk of procedure, including bruising, bleeding, hematoma formation, infection, muscle weakness, muscle pain, numbness, among others.        MNC    Nerve / Sites Muscle Latency Ref. Amplitude Ref. Rel Amp Segments Distance Velocity Ref. Area    ms ms mV mV %  cm m/s m/s mVms  L Median - APB     Wrist APB 4.0 <=4.4 2.3 >=4.0 100 Wrist - APB 7   8.0     Upper arm APB 7.6  2.4  103 Upper arm - Wrist 20 55 >=49 7.9  L Ulnar - ADM     Wrist ADM 2.8 <=3.3 6.3 >=6.0 100 Wrist - ADM 7   20.6      B.Elbow ADM 4.3  6.2  98.5 B.Elbow - Wrist 10 64 >=49 20.4     A.Elbow ADM 6.3  6.1  98.4 A.Elbow - B.Elbow 14 71 >=49 19.7  L Peroneal - EDB     Ankle EDB 4.3 <=6.5 3.9 >=2.0 100 Ankle - EDB 9   12.1     Fib head EDB 8.9  3.6  92.1 Fib head - Ankle 24 52 >=44 11.3     Pop fossa EDB 10.4  3.7  103 Pop fossa - Fib head 10 67 >=44 12.6         Pop fossa - Ankle      R Peroneal - EDB     Ankle EDB 4.3 <=6.5 4.3 >=2.0 100 Ankle - EDB 9   11.4     Fib head EDB 9.1  4.0  92.3 Fib head - Ankle 24 50 >=44 11.4     Pop fossa EDB 10.7  4.3  107 Pop fossa - Fib head 9 58 >=44 11.6         Pop fossa - Ankle      L Tibial - AH     Ankle AH 3.9 <=5.8 7.1 >=4.0 100 Ankle - AH 9   11.6     Pop fossa AH 10.6  7.6  108 Pop fossa - Ankle 34 51 >=41 16.2  R Tibial - AH     Ankle AH 3.9 <=5.8 6.4 >=4.0 100 Ankle - AH 9   12.1  Pop fossa AH 9.8  6.1  96.2 Pop fossa - Ankle 31.6 54 >=41 15.2                 SNC    Nerve / Sites Rec. Site Peak Lat Ref.  Amp Ref. Segments Distance    ms ms V V  cm  R Sural - Ankle (Calf)     Calf Ankle 3.0 <=4.4 6 >=6 Calf - Ankle 14  L Sural - Ankle (Calf)     Calf Ankle 3.6 <=4.4 8 >=6 Calf - Ankle 14  L Superficial peroneal - Ankle     Lat leg Ankle 3.2 <=4.4 7 >=6 Lat leg - Ankle 14  R Superficial peroneal - Ankle     Lat leg Ankle 3.5 <=4.4 5 >=6 Lat leg - Ankle 14  L Median - Orthodromic (Dig II, Mid palm)     Dig II Wrist 4.0 <=3.4 11 >=10 Dig II - Wrist 13  L Ulnar - Orthodromic, (Dig V, Mid palm)     Dig V Wrist 2.9 <=3.1 7 >=5 Dig V - Wrist 6                 F  Wave    Nerve F Lat Ref.   ms ms  L Tibial - AH 45.1 <=56.0  R Tibial - AH 41.0 <=56.0  L Ulnar - ADM 23.2 <=32.0           H Reflex    Nerve H Lat Lat Hmax   ms ms   Left Right Ref. Left Right Ref.  Tibial - Soleus 34.0 31.9 <=35.0 21.5 21.7 <=35.0         EMG Summary Table    Spontaneous MUAP Recruitment  Muscle IA Fib PSW Fasc Other Amp Dur. Poly Pattern  L. Tibialis anterior  Normal None None None _______ Normal Normal Normal Normal  L. Tibialis posterior Normal None None None _______ Normal Normal Normal Normal  L. Peroneus longus Normal None None None _______ Normal Normal Normal Normal  L. Gastrocnemius (Medial head) Normal None None None _______ Normal Normal Normal Normal  L. Vastus lateralis Normal None None None _______ Normal Normal Normal Normal  R. Tibialis anterior Normal None None None _______ Normal Normal Normal Normal  R. Tibialis posterior Normal None None None _______ Normal Normal Normal Normal  R. Peroneus longus Normal None None None _______ Normal Normal Normal Normal  R. Gastrocnemius (Medial head) Normal None None None _______ Normal Normal Normal Normal  R. Vastus lateralis Normal None None None _______ Normal Normal Normal Normal  R. Biceps femoris (short head) Normal None None None _______ Normal Normal Normal Normal  R. Biceps femoris (long head) Normal None None None _______ Normal Normal Normal Normal  R. Lumbar paraspinals (low) Normal None None None _______ Normal Normal Normal Normal  R. Lumbar paraspinals (mid) Normal None None None _______ Normal Normal Normal Normal  L. Biceps femoris (long head) Normal None None None _______ Normal Normal Normal Normal  L. Biceps femoris (short head) Normal None None None _______ Normal Normal Normal Normal  L. Lumbar paraspinals (mid) Normal None None None _______ Normal Normal Normal Normal  L. Lumbar paraspinals (low) Normal None None None _______ Normal Normal Normal Normal

## 2023-08-03 ENCOUNTER — Other Ambulatory Visit: Payer: Self-pay | Admitting: Obstetrics and Gynecology

## 2023-08-04 NOTE — Telephone Encounter (Signed)
Med refill request: prozac Last AEX: 10/14/22 Next AEX: not scheduled Last MMG (if hormonal med) 06/10/23 Refill authorized: Please Advise, #90, 1 RF

## 2023-08-13 DIAGNOSIS — H04123 Dry eye syndrome of bilateral lacrimal glands: Secondary | ICD-10-CM | POA: Diagnosis not present

## 2023-08-13 DIAGNOSIS — H26492 Other secondary cataract, left eye: Secondary | ICD-10-CM | POA: Diagnosis not present

## 2023-08-13 DIAGNOSIS — H52223 Regular astigmatism, bilateral: Secondary | ICD-10-CM | POA: Diagnosis not present

## 2023-08-13 DIAGNOSIS — Z961 Presence of intraocular lens: Secondary | ICD-10-CM | POA: Diagnosis not present

## 2023-08-13 DIAGNOSIS — H35363 Drusen (degenerative) of macula, bilateral: Secondary | ICD-10-CM | POA: Diagnosis not present

## 2023-09-08 ENCOUNTER — Telehealth: Payer: Self-pay | Admitting: Neurology

## 2023-09-08 ENCOUNTER — Institutional Professional Consult (permissible substitution): Payer: Medicare PPO | Admitting: Diagnostic Neuroimaging

## 2023-09-08 NOTE — Telephone Encounter (Signed)
Nerve conduction study result was relayed to her at the day of the study on August 01, 2023  If she still has a lot of question about the result, may set up a virtual visit, not urgent. Conclusion: This is a slight normal study.  There is electrodiagnostic evidence of mild left carpal tunnel syndromes.  There is no evidence of large fiber peripheral neuropathy or active lumbosacral radiculopathy.

## 2023-09-08 NOTE — Telephone Encounter (Signed)
Patient came in for appointment but was seen in Sept with Dr. Terrace Arabia. She had a Nerve Cond and said that she never received a fu appt to discuss.

## 2023-09-12 ENCOUNTER — Ambulatory Visit (INDEPENDENT_AMBULATORY_CARE_PROVIDER_SITE_OTHER): Payer: Medicare PPO

## 2023-09-12 VITALS — BP 130/70 | HR 107 | Resp 20 | Ht 59.5 in | Wt 123.0 lb

## 2023-09-12 DIAGNOSIS — M810 Age-related osteoporosis without current pathological fracture: Secondary | ICD-10-CM

## 2023-09-12 MED ORDER — DENOSUMAB 60 MG/ML ~~LOC~~ SOSY
60.0000 mg | PREFILLED_SYRINGE | Freq: Once | SUBCUTANEOUS | Status: AC
Start: 2023-09-12 — End: 2023-09-12
  Administered 2023-09-12: 60 mg via SUBCUTANEOUS

## 2023-09-12 MED ORDER — DENOSUMAB 60 MG/ML ~~LOC~~ SOSY
60.0000 mg | PREFILLED_SYRINGE | Freq: Once | SUBCUTANEOUS | Status: AC
  Administered 2024-10-14: 60 mg via SUBCUTANEOUS

## 2023-09-12 NOTE — Progress Notes (Signed)
After obtaining consent, and per orders of Dr. Elvera Lennox, injection of Prolia given by Tera Partridge. Patient instructed to remain in clinic for 20 minutes afterwards, and to report any adverse reaction to me immediately.

## 2023-09-17 DIAGNOSIS — H35363 Drusen (degenerative) of macula, bilateral: Secondary | ICD-10-CM | POA: Diagnosis not present

## 2023-09-17 DIAGNOSIS — Z961 Presence of intraocular lens: Secondary | ICD-10-CM | POA: Diagnosis not present

## 2023-09-17 DIAGNOSIS — H04123 Dry eye syndrome of bilateral lacrimal glands: Secondary | ICD-10-CM | POA: Diagnosis not present

## 2023-09-17 DIAGNOSIS — H52223 Regular astigmatism, bilateral: Secondary | ICD-10-CM | POA: Diagnosis not present

## 2023-09-19 ENCOUNTER — Other Ambulatory Visit: Payer: Self-pay | Admitting: Family Medicine

## 2023-09-19 DIAGNOSIS — K219 Gastro-esophageal reflux disease without esophagitis: Secondary | ICD-10-CM

## 2023-10-09 ENCOUNTER — Encounter: Payer: Self-pay | Admitting: Family Medicine

## 2023-10-09 DIAGNOSIS — F325 Major depressive disorder, single episode, in full remission: Secondary | ICD-10-CM

## 2023-10-13 ENCOUNTER — Encounter: Payer: Self-pay | Admitting: Internal Medicine

## 2023-10-13 ENCOUNTER — Ambulatory Visit: Payer: Medicare PPO | Admitting: Internal Medicine

## 2023-10-13 VITALS — BP 120/60 | HR 87 | Ht 59.5 in | Wt 121.0 lb

## 2023-10-13 DIAGNOSIS — E559 Vitamin D deficiency, unspecified: Secondary | ICD-10-CM | POA: Diagnosis not present

## 2023-10-13 DIAGNOSIS — M810 Age-related osteoporosis without current pathological fracture: Secondary | ICD-10-CM | POA: Diagnosis not present

## 2023-10-13 NOTE — Progress Notes (Addendum)
 Patient ID: Jessica Casey, female   DOB: 1951-03-21, 73 y.o.   MRN: [id number]   HPI  Jessica Casey is a 73 y.o.-year-old femaleemale, initially referred by her OB/GYN doctor, Dr. Cathlyn, returning for follow-up for osteopenia (Op) on DXA scans, but with clinical osteoporosis.  Last visit 1 year ago.  Interim history: No falls or fractures since last visit. No dizziness/orthostasis/poor vision. Occasional mild vertigo. Since last visit, she had TAH + BSO 01/28/2023 for an ovarian mass diagnosed 11/2022 >> benign. She developed ONJ in 06/2022 after a tooth extraction >> had increased pain >> saw an oral surgeon and had surgery 11/2022.  This has healed.  Reviewed history: She was diagnosed with osteoporosis in 2012, then osteopenia in 2016.  However, due to history of fracture, she has clinical osteoporosis.  Reviewed previous DXA scan reports: 10/19/2021 ([facility]) L1-L4 T score FN T score 33% distal Radius FRAX score  -Full report not available  n/a RFN: ? LFN: -2.0 -0.9  10-year major osteoporotic fracture: 12% Hip fracture: 2.4%   Date L1-L4 T score FN T score 33% distal Radius  08/23/2019 ([facility]) n/a RFN: -1.9 LFN: -2.0 -1.4  08/12/2017 ([facility]) n/a RFN: -1.6 LFN: -1.6  -1.6  08/07/2015 ([facility]) n/a RFN: -1.9 LFN: -1.8  -0.7  05/07/2011 ([facility]) n/a RFN: -2.3 LFN: -2.5  -0.9   She has a history of: - wrist in 2015 (lost balance on concrete)  Osteoporosis treatments:  - Fosamax  70 mg weekly  - started 2012 - Prolia -started 03/07/2020 but she did not receive further doses; restarted after our visit from 09/2021:  11/09/2021 05/10/2022 11/12/2022 09/12/2023 She had ONJ at the site of a tooth extraction 11/2022 (by biopsy).  This healed well.  We decided to continue with Prolia .  She has a history of vitamin D  deficiency: Lab Results  Component Value Date   VD25OH 67.99 10/11/2022   VD25OH 56.5 10/05/2021   VD25OH 76.41 10/03/2020   VD25OH 92.12 09/13/2019   VD25OH 79.96 02/10/2019    VD25OH 61 05/16/2014   VD25OH 28 (L) 11/01/2010   VD25OH 30 08/02/2009   She is on calcium  and vitamin D : Vitamin D  2000 units QD + multivitamin + calcium -vitamin D .  She was walking for exercise 1.65 miles 3-4 times a week.  Last visit she was less active after her R hip replacement surgery in 08/2021.  She is walking.  She also does some weightbearing exercises, using bands.  She has a h/o 3 back surgeries - cervical area and also spinal fusion.  She continues to have back pain.  She does not take high vitamin A doses.  Menopause was at 73 years old.  She was on HRT for 2 years. However, BrCA in sister and mother (Estrogen R +) >> she was taken off HRT.  FH of osteoporosis: sister - Op.  No history of kidney stones, persistent hyper or hypocalcemia: Lab Results  Component Value Date   CALCIUM  9.1 01/23/2023   CALCIUM  9.6 10/11/2022   CALCIUM  9.6 10/05/2021   CALCIUM  9.1 08/29/2021   CALCIUM  9.4 07/30/2021   CALCIUM  9.0 10/03/2020   CALCIUM  9.2 09/13/2019   CALCIUM  9.3 02/10/2019   CALCIUM  9.4 08/06/2016   CALCIUM  9.4 02/28/2016   No history of thyrotoxicosis: Lab Results  Component Value Date   TSH 4.070 10/05/2021   TSH 2.04 08/06/2016   TSH 1.28 08/08/2015   TSH 2.109 05/16/2014   TSH 1.41 02/17/2013   No history of CKD, but latest BUN/creatinine: Lab  Results  Component Value Date   BUN 19 01/23/2023   CREATININE 0.94 01/23/2023   ROS: + See HPI  I reviewed pt's medications, allergies, PMH, social hx, family hx, and changes were documented in the history of present illness. Otherwise, unchanged from my initial visit note.  Past Medical History:  Diagnosis Date   Cataract    bilateral removed   Colonic polyp 2005 & 2013   DDD (degenerative disc disease)    Depression    Diverticulitis large intestine 12/12/2022   GERD (gastroesophageal reflux disease)    H/O echocardiogram    before 2000, no need for f/u /w cardiac    Heart murmur    MVP- echo- 2010,  no symptoms    Memory disorder 09/19/2017   Mononucleosis 1971   Osteopenia after menopause    Ovarian mass, left 12/12/2022   See MRI 12/12/22   Sleep apnea    cpap   Past Surgical History:  Procedure Laterality Date   ANTERIOR CERVICAL DECOMP/DISCECTOMY FUSION N/A 05/20/2013   Procedure: ANTERIOR CERVICAL DECOMPRESSION/DISCECTOMY FUSION 2 LEVELS;  Surgeon: Fairy Levels, MD;  Location: [facility];  Service: Neurosurgery;  Laterality: N/A;  Cervical Seven-Thoracic One, Thoracic One-Two Anterior cervical decompression/Diskectomy/Fusion   CARPAL TUNNEL RELEASE Right    CATARACT EXTRACTION, BILATERAL  10/01/2011   Dr Roz, EFRAIM IOL   CERVICAL FUSION  2014   3 proceduresr Levels, 2007, 20012 and 2014   COLONOSCOPY  2018   SA- TA   colonoscopy with polypectomy  10/01/2011   Dr Debrah   KNEE SURGERY     Dr Willy ; bursa cystectomy   LUMBAR DISC SURGERY  2010   Dr.Stern and 1999   ROBOTIC ASSISTED TOTAL HYSTERECTOMY WITH BILATERAL SALPINGO OOPHERECTOMY Bilateral 01/28/2023   Procedure: XI ROBOTIC ASSISTED TOTAL HYSTERECTOMY WITH BILATERAL SALPINGO OOPHORECTOMY;  Surgeon: Eldonna Mays, MD;  Location: [facility];  Service: Gynecology;  Laterality: Bilateral;   ROTATOR CUFF REPAIR     Dr.Wainer   TOTAL HIP ARTHROPLASTY Right 09/04/2021   Procedure: TOTAL HIP ARTHROPLASTY ANTERIOR APPROACH;  Surgeon: Beverley Evalene BIRCH, MD;  Location: [facility];  Service: Orthopedics;  Laterality: Right;   UMBILICAL HERNIA REPAIR  1952   as a baby   WRIST FRACTURE SURGERY Left 02/28/2013   Dr. Toribio Beverley   Social History   Socioeconomic History   Marital status: Married    Spouse name: Not on file   Number of children: 2   Years of education: 16+   Highest education level: Not on file  Occupational History   Occupation: Editor, Commissioning- Retired  Tobacco Use   Smoking status: Never Smoker   Smokeless tobacco: Never Used  Substance and Sexual Activity   Alcohol use: No    Alcohol/week: 0.0  standard drinks   Drug use: No   Sexual activity: Not Currently    Partners: Male    Birth control/protection: Post-menopausal  Other Topics Concern   Not on file  Social History Narrative   Lives   Caffeine use:    Right handed    Social Determinants of Health   Financial Resource Strain:    Difficulty of Paying Living Expenses: Not on file  Food Insecurity:    Worried About Running Out of Food in the Last Year: Not on file   [employer] of Food in the Last Year: Not on file  Transportation Needs:    Lack of Transportation (Medical): Not on file   Lack of Transportation (Non-Medical): Not on  file  Physical Activity:    Days of Exercise per Week: Not on file   Minutes of Exercise per Session: Not on file  Stress:    Feeling of Stress : Not on file  Social Connections:    Frequency of Communication with Friends and Family: Not on file   Frequency of Social Gatherings with Friends and Family: Not on file   Attends Religious Services: Not on file   Active Member of Clubs or Organizations: Not on file   Attends Banker Meetings: Not on file   Marital Status: Not on file  Intimate Partner Violence:    Fear of Current or Ex-Partner: Not on file   Emotionally Abused: Not on file   Physically Abused: Not on file   Sexually Abused: Not on file   Current Outpatient Medications on File Prior to Visit  Medication Sig Dispense Refill   acetaminophen  (TYLENOL ) 650 MG CR tablet Take 1,300 mg by mouth every 8 (eight) hours as needed for pain.     Bacillus Coagulans-Inulin (ALIGN PREBIOTIC-PROBIOTIC PO) Take by mouth.     Biotin  1000 MCG tablet Take 1,000 mcg by mouth daily.     buPROPion  (WELLBUTRIN  XL) 300 MG 24 hr tablet Take 1 tablet (300 mg total) by mouth daily. 90 tablet 3   Calcium  Carb-Cholecalciferol  (CALCIUM  600 + D PO) Take 1 tablet by mouth daily.     Cholecalciferol  (VITAMIN D ) 2000 UNITS CAPS Take 2,000 Units by mouth daily.     Cyanocobalamin (B-12) 1000 MCG  CAPS Take 1,000 mcg by mouth daily.     denosumab  (PROLIA ) 60 MG/ML SOSY injection Inject 60 mg into the skin every 6 (six) months. At MD office, last done 03/2022     diclofenac  sodium (VOLTAREN ) 1 % GEL Apply 2 g topically 4 (four) times daily. (Patient taking differently: Apply 2 g topically 4 (four) times daily as needed (pain).) 3 Tube 3   FLUoxetine  (PROZAC ) 20 MG tablet TAKE 1 TABLET EVERY DAY 90 tablet 0   Multiple Vitamin (MULTIVITAMIN WITH MINERALS) TABS tablet Take 1 tablet by mouth daily.     omeprazole  (PRILOSEC) 40 MG capsule TAKE 1 CAPSULE (40 MG TOTAL) BY MOUTH DAILY. 90 capsule 1   Current Facility-Administered Medications on File Prior to Visit  Medication Dose Route Frequency Provider Last Rate Last Admin   [START ON 03/12/2024] denosumab  (PROLIA ) injection 60 mg  60 mg Subcutaneous Once Malikhi Ogan, MD       Allergies  Allergen Reactions   Gabapentin     Tongue swelling and itching    Family History  Problem Relation Age of Onset   Breast cancer Mother        estorgen receptor positive   Stroke Mother 69       Dec   Diabetes Father    Coronary artery disease Father        S/P CBAG , no MI -- Dec   Depression Father    Diverticulosis Father    Breast cancer Sister        estrogen receptor positive   Depression Sister    Depression Sister    Depression Sister    Depression Sister    Colon polyps Sister    Depression Brother    Colon cancer Maternal Grandfather    Bipolar disorder Other        Neice   Esophageal cancer Neg Hx    Pancreatic cancer Neg Hx    Rectal cancer Neg  Hx    Stomach cancer Neg Hx    Ovarian cancer Neg Hx    Endometrial cancer Neg Hx    PE: BP 120/60   Pulse 87   Ht 4' 11.5 (1.511 m)   Wt 121 lb (54.9 kg)   LMP 10/01/1995 (Approximate)   SpO2 97%   BMI 24.03 kg/m  Wt Readings from Last 3 Encounters:  10/13/23 121 lb (54.9 kg)  09/12/23 123 lb (55.8 kg)  08/01/23 118 lb (53.5 kg)   Constitutional: normal weight, in  NAD Eyes:  EOMI, no exophthalmos ENT: no neck masses, no cervical lymphadenopathy Cardiovascular: RRR, No MRG Respiratory: CTA B Musculoskeletal: no deformities Skin:no rashes Neurological: no tremor with outstretched hands  Assessment: 1. Clinical Osteoporosis  2.  Vitamin D  deficiency  Plan: 1. Osteoporosis -Likely postmenopausal as she had early menopause, she also has family history of osteopenia -Before starting Prolia , femoral T-scores were worse.  She also had a fragility fracture in 2015.  Overall, therefore, she has clinical osteoporosis and she is at risk for further fractures. -After her first Prolia  injection in 02/2020 she was off the medication until our visit from 09/2021.  We decided to start the medication at that time.  Latest bone density scan is from 09/2021.  I do not have the full report, only the physician interpretation, but the T-scores appear to be better at the 33% distal radius and also left total hip.  It was stable in the left femoral neck. -in 06/2022, she started to have jaw pain and she was found to have a small site of ONJ by biopsy at the site of a dental extraction.  Of note, her dentist was not aware that she was on Prolia  at that time.  He mentioned that had he known, he would have done the extraction differently.  She actually had to have jaw surgery in 11/2022.  It healed well afterwards.  We discussed that ONJ can develop at the site of trauma to the jaw (for example dental extraction or surgical site), but we decided to continue Prolia  afterwards. Latest injection was 09/12/2023.  No Mounjaro problems afterwards.  Also, she denies hip and thigh pain. -She is aware about fall precautions -She is getting 1200 mg of calcium  at least from diet and supplements -She continues to walk and also exercising with bands.  We discussed about the importance of weightbearing exercises and she is planning to increase these. -She is due for another DXA scan-will order  today -At today's visit, we will recheck her calcium , kidney function, and vitamin D  -I will see her back in 1 year  2.  Vitamin D  deficiency -She continues on supplementation with approximately 3000 units daily -Vitamin D  level was normal at last visit, at 68 -Will recheck this today  Orders Placed This Encounter  Procedures   DG Bone Density   Vitamin D , 25-hydroxy   Component     Latest Ref Rng 10/13/2023  Vitamin D , 25-Hydroxy     30 - 100 ng/mL 48   Normal.  10/28/2023 ([facility]) L1-L4 T score FN T score 33% distal Radius  -Full report not available  n/a RFN: ? LFN: -0.9 -1.2 (-4%)       2023 ([facility]) L1-L4 T score FN T score 33% distal Radius FRAX score  -Full report not available  n/a RFN: ? LFN: -2.0 -0.9   10-year major osteoporotic fracture: 12% Hip fracture: 2.4%   It was mentioned that since 03/2022, their Hologic densitometer  no longer supported the use of express scan type and this influenced the report for the left hip scan.  Therefore, the left femoral neck bone mineral density could not be directly compared with the 2023 value. The 33% distal radius is lower but they did not report this is a statistically significant difference...  For now, plan to continue Prolia  and repeat the scan in 2 years.  Lela Fendt, MD PhD [facility] End[facility]

## 2023-10-13 NOTE — Patient Instructions (Addendum)
 Please stop at the lab.  Please continue Prolia for now.  Please come back for a follow-up appointment in 1 year.

## 2023-10-14 LAB — VITAMIN D 25 HYDROXY (VIT D DEFICIENCY, FRACTURES): Vit D, 25-Hydroxy: 48 ng/mL (ref 30–100)

## 2023-10-16 ENCOUNTER — Other Ambulatory Visit: Payer: Self-pay | Admitting: Obstetrics and Gynecology

## 2023-10-16 NOTE — Telephone Encounter (Signed)
 Med refill request: prozac Last AEX: 10/14/22 Next AEX: not scheduled Last MMG (if hormonal med) 06/10/23 Refill authorized: Please Advise, #[phone], 1 RF

## 2023-10-19 NOTE — Telephone Encounter (Signed)
Last Prolia inj 10/13/23 Next Prolia inj due 04/12/24

## 2023-10-28 ENCOUNTER — Ambulatory Visit (HOSPITAL_COMMUNITY): Payer: Self-pay | Admitting: Registered Nurse

## 2023-10-28 DIAGNOSIS — M8588 Other specified disorders of bone density and structure, other site: Secondary | ICD-10-CM | POA: Diagnosis not present

## 2023-11-09 ENCOUNTER — Encounter: Payer: Self-pay | Admitting: Internal Medicine

## 2023-11-11 ENCOUNTER — Encounter (HOSPITAL_COMMUNITY): Payer: Self-pay | Admitting: Registered Nurse

## 2023-11-11 ENCOUNTER — Ambulatory Visit (INDEPENDENT_AMBULATORY_CARE_PROVIDER_SITE_OTHER): Payer: Medicare PPO | Admitting: Registered Nurse

## 2023-11-11 VITALS — BP 132/81 | HR 90 | Ht 59.0 in | Wt 122.0 lb

## 2023-11-11 DIAGNOSIS — F331 Major depressive disorder, recurrent, moderate: Secondary | ICD-10-CM | POA: Insufficient documentation

## 2023-11-11 DIAGNOSIS — F411 Generalized anxiety disorder: Secondary | ICD-10-CM | POA: Insufficient documentation

## 2023-11-11 HISTORY — DX: Generalized anxiety disorder: F41.1

## 2023-11-11 HISTORY — DX: Major depressive disorder, recurrent, moderate: F33.1

## 2023-11-11 MED ORDER — BUSPIRONE HCL 5 MG PO TABS
5.0000 mg | ORAL_TABLET | Freq: Two times a day (BID) | ORAL | 0 refills | Status: DC
Start: 2023-11-11 — End: 2023-12-03

## 2023-11-11 MED ORDER — FLUOXETINE HCL 20 MG PO CAPS
20.0000 mg | ORAL_CAPSULE | Freq: Every day | ORAL | 0 refills | Status: DC
Start: 2023-11-11 — End: 2023-12-03

## 2023-11-11 MED ORDER — BUPROPION HCL ER (XL) 300 MG PO TB24
300.0000 mg | ORAL_TABLET | Freq: Every day | ORAL | 0 refills | Status: DC
Start: 2023-11-11 — End: 2023-12-22

## 2023-11-11 MED ORDER — FLUOXETINE HCL 10 MG PO CAPS
10.0000 mg | ORAL_CAPSULE | Freq: Every day | ORAL | 0 refills | Status: DC
Start: 2023-11-11 — End: 2023-12-03

## 2023-11-11 NOTE — Patient Instructions (Signed)
Talk to your primary physician at next visit about getting EKG.  Call 911, mobile crisis, or present to the nearest emergency room should you experience any suicidal/homicidal ideation, auditory/visual/hallucinations, or detrimental worsening of your mental health.  Mobile Crisis Response Teams Listed by counties in vicinity of [facility] providers [facility] [phone] [facility] [phone] [facility] [phone] [facility] [phone] [place]                * [facility] [phone]                * [facility] [phone]  [facility] [phone] [facility] [employer]  [phone] * [facility] [phone]
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
## 2023-11-12 LAB — TSH: TSH: 2.94 u[IU]/mL (ref 0.450–4.500)

## 2023-11-12 LAB — CBC WITH DIFFERENTIAL/PLATELET
Basophils Absolute: 0.1 10*3/uL (ref 0.0–0.2)
Basos: 0 %
EOS (ABSOLUTE): 0.1 10*3/uL (ref 0.0–0.4)
Eos: 1 %
Hematocrit: 36.4 % (ref 34.0–46.6)
Hemoglobin: 12 g/dL (ref 11.1–15.9)
Immature Grans (Abs): 0 10*3/uL (ref 0.0–0.1)
Immature Granulocytes: 0 %
Lymphocytes Absolute: 9.8 10*3/uL — ABNORMAL HIGH (ref 0.7–3.1)
Lymphs: 71 %
MCH: 32.7 pg (ref 26.6–33.0)
MCHC: 33 g/dL (ref 31.5–35.7)
MCV: 99 fL — ABNORMAL HIGH (ref 79–97)
Monocytes Absolute: 0.4 10*3/uL (ref 0.1–0.9)
Monocytes: 3 %
Neutrophils Absolute: 3.5 10*3/uL (ref 1.4–7.0)
Neutrophils: 25 %
Platelets: 297 10*3/uL (ref 150–450)
RBC: 3.67 x10E6/uL — ABNORMAL LOW (ref 3.77–5.28)
RDW: 12 % (ref 11.7–15.4)
WBC: 13.8 10*3/uL — ABNORMAL HIGH (ref 3.4–10.8)

## 2023-11-12 LAB — COMPREHENSIVE METABOLIC PANEL
ALT: 16 [IU]/L (ref 0–32)
AST: 19 [IU]/L (ref 0–40)
Albumin: 4.2 g/dL (ref 3.8–4.8)
Alkaline Phosphatase: 80 [IU]/L (ref 44–121)
BUN/Creatinine Ratio: 28 (ref 12–28)
BUN: 31 mg/dL — ABNORMAL HIGH (ref 8–27)
Bilirubin Total: 0.4 mg/dL (ref 0.0–1.2)
CO2: 25 mmol/L (ref 20–29)
Calcium: 9.3 mg/dL (ref 8.7–10.3)
Chloride: 105 mmol/L (ref 96–106)
Creatinine, Ser: 1.09 mg/dL — ABNORMAL HIGH (ref 0.57–1.00)
Globulin, Total: 2.4 g/dL (ref 1.5–4.5)
Glucose: 89 mg/dL (ref 70–99)
Potassium: 4.3 mmol/L (ref 3.5–5.2)
Sodium: 143 mmol/L (ref 134–144)
Total Protein: 6.6 g/dL (ref 6.0–8.5)
eGFR: 54 mL/min/{1.73_m2} — ABNORMAL LOW (ref >59)

## 2023-11-12 LAB — LIPID PANEL
Chol/HDL Ratio: 3.1 {ratio} (ref 0.0–4.4)
Cholesterol, Total: 194 mg/dL (ref 100–199)
HDL: 63 mg/dL (ref >39)
LDL Chol Calc (NIH): 116 mg/dL — ABNORMAL HIGH (ref 0–99)
Triglycerides: 85 mg/dL (ref 0–149)
VLDL Cholesterol Cal: 15 mg/dL (ref 5–40)

## 2023-11-12 LAB — HEMOGLOBIN A1C
Est. average glucose Bld gHb Est-mCnc: 114 mg/dL
Hgb A1c MFr Bld: 5.6 % (ref 4.8–5.6)

## 2023-12-03 ENCOUNTER — Telehealth (HOSPITAL_COMMUNITY): Payer: Medicare PPO | Admitting: Registered Nurse

## 2023-12-03 ENCOUNTER — Encounter (HOSPITAL_COMMUNITY): Payer: Self-pay | Admitting: Registered Nurse

## 2023-12-03 DIAGNOSIS — F411 Generalized anxiety disorder: Secondary | ICD-10-CM | POA: Diagnosis not present

## 2023-12-03 DIAGNOSIS — F331 Major depressive disorder, recurrent, moderate: Secondary | ICD-10-CM

## 2023-12-03 MED ORDER — BUSPIRONE HCL 5 MG PO TABS
5.0000 mg | ORAL_TABLET | Freq: Two times a day (BID) | ORAL | 0 refills | Status: DC
Start: 2023-12-03 — End: 2024-01-27

## 2023-12-03 MED ORDER — SERTRALINE HCL 25 MG PO TABS
12.5000 mg | ORAL_TABLET | Freq: Every day | ORAL | 1 refills | Status: DC
Start: 2023-12-03 — End: 2024-01-27

## 2023-12-03 MED ORDER — FLUOXETINE HCL 10 MG PO TABS
ORAL_TABLET | ORAL | 0 refills | Status: DC
Start: 2023-12-03 — End: 2024-01-27

## 2023-12-03 NOTE — Patient Instructions (Addendum)
 Prozac Taper:  You are currently taking 30 mg daily Week one:  Take 20 mg daily for 7 days Week Two: Take 10 mg for 7 days Weeks 3 - 6 will require cutting tablet in half.  (Cut the 10 mg tablet in half.  If your Prozac is a capsule and not a tablet a 10 mg tablet will be prescribed for you to assist with taper).   Week 3:  Take 5 mg for 7 days  Week 4:  Take 5 mg every other day (Sun, Tues, Thurs, Sat) Week 5:  Take 5 mg Every 3 days (Tues, Fri) Week 6:  Take 5 mg once week (Mon)  Prozac (fluoxetine) is an antidepressant. When stopping Prozac, people may experience withdrawal symptoms such as tiredness, irritability, and headaches.  Research suggests that anywhere from 56% to 80% of people who attempt to come off antidepressants experience symptoms of medication withdrawal or discontinuation.  Because of symptoms a slow taper is recommended.   Withdrawal symptoms typically occur around 2 to 4 days after a person tapers their dosage. Common symptoms relating to reducing the dosage of Prozac include: brain zaps, dizziness, anxiety, mood changes, irritability, confusion, headaches, tiredness, insomnia, suicidal thoughts.    Antidepressant discontinuation syndrome:  One of the symptoms that can occur are brain zaps which are described as electrical shock sensations or electrical current zapping brain, which can be very uncomfortable and frightening to someone.    Antidepressant withdrawal: During withdrawal from antidepressant medications, "brain zaps" are considered common symptoms to experience. It is believed that the severity and length of brain zaps may be related to whether a person discontinues "cold [place]" as opposed to tapering off of their medication.  How to stop Brain Zaps (an electrical-shock-like sensation) There are no known medical treatments to stop the brain zaps. Most often individuals may have to them with the understanding that in time they will eventually subside. Below are some  recommendations that may help deal with the zaps.  Slower taper:  If quit medication cold [place] may need to start taking again, and then conduct a slower/gradual taper off.  Often zaps are caused when an individual quits medication too quickly and/or from to high of a dose.    Go back on medication:  Some individuals choose to go back on medications.  You can then decide if you wish to taper off or switch to a different medication.  You provider may also choose to add an adjunct to current medications.    Prozac:  has a longer half-life.  Another way to minimize brain zaps is to transition to another drug that has a longer half-life.  Your provider could start Prozac during the taper off of your current antidepressant, and then later stop.  Withdrawing from the Prozac should reduce chances of zaps.   Vitamins and Supplements:  Various supplements have been reported to reduce the severity of the brain zaps.  However, it has not been verified that these supplements actually work to stop/reduce the zaps. Vitamins and Omega-3 fatty acids:  Individuals have reported that there is a significant improvement with Omega-3 fatty acids in the form of "fish oil" Vitamin B12 It has been suggested that getting vitamins help to minimize the zaps.   A combination of Vitamin B12 and Omega-3 fatty acid has been reported to decrease the severity and frequency of zaps in some individuals.  Talk to your primary care physician about abnormal lab values and retesting.   Call 911,  988, mobile crisis, or present to the nearest emergency room should you experience any suicidal/homicidal ideation, auditory/visual/hallucinations, or detrimental worsening of your mental health.  Mobile Crisis Response Teams Listed by counties in vicinity of [facility] providers [place] [facility] [phone] [facility] [phone] [facility] [phone] [facility] [phone] [place]                * [facility] [phone]                * [facility] [phone]  [facility] [phone] [facility] [employer]  [phone] * [facility] [phone]

## 2023-12-03 NOTE — Progress Notes (Signed)
 [facility] MD/PA/NP OP Progress Note  12/03/2023 4:28 PM [name]  MRN:  [id number]  Virtual Visit via Video Note  I connected with Jessica Casey on 12/03/23 at  2:30 PM EST by a video enabled telemedicine application and verified that I am speaking with the correct person using two identifiers.  Location: Patient: Home Provider: Home office   I discussed the limitations of evaluation and management by telemedicine and the availability of in person appointments. The patient expressed understanding and agreed to proceed.   I discussed the assessment and treatment plan with the patient. The patient was provided an opportunity to ask questions and all were answered. The patient agreed with the plan and demonstrated an understanding of the instructions.   The patient was advised to call back or seek an in-person evaluation if the symptoms worsen or if the condition fails to improve as anticipated.  I provided 45 minutes of non-face-to-face time during this encounter.   Assunta Found, NP   Chief Complaint: No chief complaint on file.  HPI: Jessica Casey 73 y.o. female presents for medication management follow up.  She is seen via virtual video visit by this provider, and chart reviewed on 12/03/23.  Her psychiatric history includes major depressive disorder and general anxiety disorder. Her mental health is currently managed with Prozac 30 mg daily Wellbutrin XL 300 mg daily, and Buspar 5 mg daily.  She reports that she feels no better than she did on first visit 2 weeks ago after medication adjustments.  She states that she has been on current medications for a while and after a while she has to be started on something else.  Reports prior to current medication she was taking Lexapro which was switched to Prozac.  Discussed starting Zoloft but informed would have to do a slow taper of Prozac to prevent withdrawal symptoms.   Today she denies suicidal/self-harm/homicidal ideation psychosis,  paranoia, fluctuations in mood, and abnormal movements.  Discussed abnormal lab values (CBC, CMP) and instructed to follow up with her PCP  Recommended the following medication changes:  Continue Buspar 5 mg Bid, Continue Wellbutrin 300 mg daily.  Start taper of Prozac (taper recommendation added to AVS), Start Zoloft 12.5 mg daily.  Will discuss increasing at next scheduled visit.  Follow up with PCP related to abnormal lab values (CBC, CMP).  Informed of side effect/efficacy profile of Zoloft and that it would take a couple of weeks before notable improvements would be seen.  Informed of discontinuation syndrome and withdrawal symptoms of Prozac.  She expresses understanding with information being communicated to her today and is agreeable to recommendations.    Visit Diagnosis:    ICD-10-CM   1. MDD (major depressive disorder), recurrent episode, moderate (HCC)  F33.1 FLUoxetine (PROZAC) 10 MG tablet    sertraline (ZOLOFT) 25 MG tablet    2. GAD (generalized anxiety disorder)  F41.1 FLUoxetine (PROZAC) 10 MG tablet    busPIRone (BUSPAR) 5 MG tablet    sertraline (ZOLOFT) 25 MG tablet      Past Psychiatric History: GAD, MDD  Past Medical History:  Past Medical History:  Diagnosis Date   Anxiety    Cataract    bilateral removed   Colonic polyp 2005 & 2013   DDD (degenerative disc disease)    Depression    Diverticulitis large intestine 12/12/2022   GAD (generalized anxiety disorder) 11/11/2023   GERD (gastroesophageal reflux disease)    H/O echocardiogram    before 2000, no  need for f/u /w cardiac    Heart murmur    MVP- echo- 2010, no symptoms    MDD (major depressive disorder), recurrent episode, moderate (HCC) 11/11/2023   Memory disorder 09/19/2017   Mononucleosis 1971   Osteopenia after menopause    Ovarian mass, left 12/12/2022   See MRI 12/12/22   Sleep apnea    cpap    Past Surgical History:  Procedure Laterality Date   ANTERIOR CERVICAL DECOMP/DISCECTOMY FUSION  N/A 05/20/2013   Procedure: ANTERIOR CERVICAL DECOMPRESSION/DISCECTOMY FUSION 2 LEVELS;  Surgeon: Maeola Harman, MD;  Location: [facility];  Service: Neurosurgery;  Laterality: N/A;  Cervical Seven-Thoracic One, Thoracic One-Two Anterior cervical decompression/Diskectomy/Fusion   BACK SURGERY     CARPAL TUNNEL RELEASE Right    CATARACT EXTRACTION, BILATERAL  10/01/2011   Dr Nile Riggs, Lacretia Nicks IOL   CERVICAL FUSION  2014   3 proceduresr Venetia Maxon, 2007, 20012 and 2014   COLONOSCOPY  2018   SA- TA   colonoscopy with polypectomy  10/01/2011   Dr Arlyce Dice   KNEE SURGERY     Dr Meade Maw ; bursa cystectomy   LUMBAR DISC SURGERY  2010   Dr.Stern and 1999   ROBOTIC ASSISTED TOTAL HYSTERECTOMY WITH BILATERAL SALPINGO OOPHERECTOMY Bilateral 01/28/2023   Procedure: XI ROBOTIC ASSISTED TOTAL HYSTERECTOMY WITH BILATERAL SALPINGO OOPHORECTOMY;  Surgeon: Clide Cliff, MD;  Location: [facility];  Service: Gynecology;  Laterality: Bilateral;   ROTATOR CUFF REPAIR     Dr.Wainer   TOTAL HIP ARTHROPLASTY Right 09/04/2021   Procedure: TOTAL HIP ARTHROPLASTY ANTERIOR APPROACH;  Surgeon: Sheral Apley, MD;  Location: [facility];  Service: Orthopedics;  Laterality: Right;   UMBILICAL HERNIA REPAIR  1952   as a baby   WRIST FRACTURE SURGERY Left 02/28/2013   Dr. Mckinley Jewel    Family Psychiatric History: States significant family history of depression and anxiety "all of my sisters have something. Father/alcoholic. One sister has attempted suicide   Family History:  Family History  Problem Relation Age of Onset   Breast cancer Mother        estorgen receptor positive   Stroke Mother 3       Dec   Diabetes Father    Coronary artery disease Father        S/P CBAG , no MI -- Dec   Depression Father    Diverticulosis Father    Breast cancer Sister        estrogen receptor positive   Depression Sister    Depression Sister    Depression Sister    Depression Sister    Colon polyps Sister    Depression Brother     Colon cancer Maternal Grandfather    Bipolar disorder Other        Neice   Esophageal cancer Neg Hx    Pancreatic cancer Neg Hx    Rectal cancer Neg Hx    Stomach cancer Neg Hx    Ovarian cancer Neg Hx    Endometrial cancer Neg Hx     Social History:  Social History   Socioeconomic History   Marital status: Married    Spouse name: Not on file   Number of children: Not on file   Years of education: 16+   Highest education level: Master's degree (e.g., MA, MS, MEng, MEd, MSW, MBA)  Occupational History   Occupation: Runner, broadcasting/film/video- Retired  Tobacco Use   Smoking status: Never   Smokeless tobacco: Never  Vaping Use   Vaping status: Never Used  Substance and Sexual Activity   Alcohol use: Never   Drug use: Never   Sexual activity: Not Currently    Partners: Male    Birth control/protection: Post-menopausal  Other Topics Concern   Not on file  Social History Narrative   Lives   Caffeine use:    Right handed    Social Drivers of Health   Financial Resource Strain: Low Risk  (11/10/2022)   Overall Financial Resource Strain (CARDIA)    Difficulty of Paying Living Expenses: Not hard at all  Food Insecurity: No Food Insecurity (12/16/2022)   Hunger Vital Sign    Worried About Running Out of Food in the Last Year: Never true    Ran Out of Food in the Last Year: Never true  Transportation Needs: No Transportation Needs (12/16/2022)   PRAPARE - Administrator, Civil Service (Medical): No    Lack of Transportation (Non-Medical): No  Physical Activity: Insufficiently Active (11/10/2022)   Exercise Vital Sign    Days of Exercise per Week: 3 days    Minutes of Exercise per Session: 30 min  Stress: Patient Declined (11/10/2022)   [employer] of Occupational Health - Occupational Stress Questionnaire    Feeling of Stress : Patient declined  Social Connections: Socially Integrated (11/10/2022)   Social Connection and Isolation Panel [NHANES]    Frequency of  Communication with Friends and Family: More than three times a week    Frequency of Social Gatherings with Friends and Family: Patient declined    Attends Religious Services: More than 4 times per year    Active Member of [employer] or Organizations: Yes    Attends Engineer, structural: More than 4 times per year    Marital Status: Married    Allergies:  Allergies  Allergen Reactions   Gabapentin     Tongue swelling and itching     Metabolic Disorder Labs: Lab Results  Component Value Date   HGBA1C 5.6 11/11/2023   No results Casey for: "PROLACTIN" Lab Results  Component Value Date   CHOL 194 11/11/2023   TRIG 85 11/11/2023   HDL 63 11/11/2023   CHOLHDL 3.1 11/11/2023   VLDL 40.9 08/08/2015   LDLCALC 116 (H) 11/11/2023   LDLCALC 107 (H) 08/08/2015   Lab Results  Component Value Date   TSH 2.940 11/11/2023   TSH 4.070 10/05/2021    Current Medications: Current Outpatient Medications  Medication Sig Dispense Refill   FLUoxetine (PROZAC) 10 MG tablet Prozac Taper:  You are currently taking 30 mg daily Week one:  Take 20 mg daily for 7 days Week Two: Take 10 mg for 7 days  Weeks 3 - 6 will require cutting tablet in half.  (Cut the 10 mg tablet in half.  If your Prozac is a capsule and not a tablet a 10 mg tablet will be prescribed for you to assist with taper).    Week 3:  Take 5 mg for 7 days  Week 4:  Take 5 mg every other day (Sun, Tue, Thurs, Sat) Week 5:  Take 5 mg Every 3 days (Tue, Fri) Week 6:  Take 5 mg once week (Mon) 40 tablet 0   sertraline (ZOLOFT) 25 MG tablet Take 0.5 tablets (12.5 mg total) by mouth daily. 15 tablet 1   acetaminophen (TYLENOL) 650 MG CR tablet Take 1,300 mg by mouth every 8 (eight) hours as needed for pain.     Bacillus Coagulans-Inulin (ALIGN PREBIOTIC-PROBIOTIC PO) Take by mouth.  Biotin 1000 MCG tablet Take 1,000 mcg by mouth daily.     buPROPion (WELLBUTRIN XL) 300 MG 24 hr tablet Take 1 tablet (300 mg total) by mouth  daily. 30 tablet 0   busPIRone (BUSPAR) 5 MG tablet Take 1 tablet (5 mg total) by mouth 2 (two) times daily. 60 tablet 0   Calcium Carb-Cholecalciferol (CALCIUM 600 + D PO) Take 1 tablet by mouth daily.     Cholecalciferol (VITAMIN D) 2000 UNITS CAPS Take 2,000 Units by mouth daily.     Cyanocobalamin (B-12) 1000 MCG CAPS Take 1,000 mcg by mouth daily.     denosumab (PROLIA) 60 MG/ML SOSY injection Inject 60 mg into the skin every 6 (six) months. At MD office, last done 03/2022     diclofenac sodium (VOLTAREN) 1 % GEL Apply 2 g topically 4 (four) times daily. (Patient taking differently: Apply 2 g topically 4 (four) times daily as needed (pain).) 3 Tube 3   Multiple Vitamin (MULTIVITAMIN WITH MINERALS) TABS tablet Take 1 tablet by mouth daily.     omeprazole (PRILOSEC) 40 MG capsule TAKE 1 CAPSULE (40 MG TOTAL) BY MOUTH DAILY. 90 capsule 1   Current Facility-Administered Medications  Medication Dose Route Frequency Provider Last Rate Last Admin   [START ON 03/12/2024] denosumab (PROLIA) injection 60 mg  60 mg Subcutaneous Once Carlus Pavlov, MD         Musculoskeletal: Strength & Muscle Tone:  unable to assess virtually Gait & Station: normal Patient leans: N/A  Psychiatric Specialty Exam: Review of Systems  Constitutional:        No other complaints voiced  Genitourinary:  Negative for dysuria, flank pain, frequency and hematuria.  Psychiatric/Behavioral:  Positive for dysphoric mood. Negative for agitation, hallucinations, self-injury, sleep disturbance and suicidal ideas. The patient is nervous/anxious.   All other systems reviewed and are negative.   Last menstrual period 10/01/1995.There is no height or weight on file to calculate BMI.  General Appearance: Casual  Eye Contact:  Good  Speech:  Clear and Coherent and Normal Rate  Volume:  Normal  Mood:  Dysphoric  Affect:  Appropriate and Congruent  Thought Process:  Coherent, Goal Directed, and Descriptions of Associations:  Intact  Orientation:  Full (Time, Place, and Person)  Thought Content: WDL and Logical   Suicidal Thoughts:  No  Homicidal Thoughts:  No  Memory:  Immediate;   Good Recent;   Good Remote;   Good  Judgement:  Intact  Insight:  Good and Present  Psychomotor Activity:  Normal  Concentration:  Concentration: Good and Attention Span: Good  Recall:  Good  Fund of Knowledge: Good  Language: Good  Akathisia:  No  Handed:  Right  AIMS (if indicated): not done  Assets:  Communication Skills Desire for Improvement Financial Resources/Insurance Housing Intimacy Leisure Time Physical Health Resilience Social Support Transportation  ADL's:  Intact  Cognition: WNL  Sleep:  Good   Screenings: GAD-7    Loss adjuster, chartered Office Visit from 11/11/2023 in [facility] Office Visit from 12/20/2022 in [facility] [place] Office Visit from 11/11/2022 in [facility] [place]  Total GAD-7 Score 21 9 4       Mini-Mental    Flowsheet Row Office Visit from 09/19/2017 in [facility]  Total Score (max 30 points ) 30      PHQ2-9    Flowsheet Row Office Visit from 11/11/2023 in [facility] Most recent reading at 11/11/2023  1:07 PM Video Visit from 04/24/2023 in [facility] [place] Most recent reading at 04/24/2023 10:51 AM Office Visit from 12/20/2022 in [facility] [place] Most recent reading at 12/20/2022 12:29 PM Office Visit from 12/30/2022 in [facility] Most recent reading at 12/16/2022  8:58 AM Office Visit from 11/11/2022 in [facility] [place] Most recent reading at 11/11/2022 10:59 AM  PHQ-2 Total Score 6 0 0 0 0  PHQ-9 Total Score 21 1 12  -- 5      Flowsheet Row Admission  (Discharged) from 01/28/2023 in [place] [facility] Admission (Discharged) from 09/04/2021 in [place] [facility]-3 WEST [facility]THOPEDICS Pre-Admission Testing 60 from 08/29/2021 in [facility]  C-SSRS RISK CATEGORY No Risk No Risk No Risk        Assessment and Plan: SAHILY BIDDLE appears to be doing fairly well.  However she reports no improvement since her last visit.  She is wanting medication changes related to after being on medications for a while will usually have to change in order to get stable again.  Discussed possible side effects of changing and medication withdrawal. Understanding voiced and agrees to medication changes.  She denies suicidal/self-harm/homicidal ideation psychosis, paranoia, fluctuations in mood, and abnormal movements. During visit she is dressed appropriate for age and weather.  She is sitting relaxed on couch with no noted distress.  She is alert/oriented x 4, calm/cooperative and mood is congruent with affect.  She spoke in a clear tone at moderate volume, and normal pace, with good eye contact.  Her thought process is coherent, relevant, and there is no indication that she is currently responding to internal/external stimuli, or experiencing delusional thought content.   Plan: Continue Buspar 5 mg Bid Continue Wellbutrin 300 mg daily Start taper of Prozac (taper recommendation added to AVS) Start Zoloft 12.5 mg daily.  Will discuss increasing at next scheduled visit.   Follow up with PCP related to abnormal lab values (CBC, CMP).   Follow up medication management in 2 weeks She is also instructed to call 911, 988, mobile crisis, or present to the nearest emergency room should she experience any suicidal/homicidal ideation, auditory/visual/hallucinations, or detrimental worsening of her mental health condition.  1. GAD (generalized anxiety disorder) - FLUoxetine (PROZAC) 10 MG tablet; Prozac Taper:  You are currently taking 30  mg daily Week one:  Take 20 mg daily for 7 days Week Two: Take 10 mg for 7 days  Weeks 3 - 6 will require cutting tablet in half.  (Cut the 10 mg tablet in half.  If your Prozac is a capsule and not a tablet a 10 mg tablet will be prescribed for you to assist with taper).    Week 3:  Take 5 mg for 7 days  Week 4:  Take 5 mg every other day (Sun, Tue, Thurs, Sat) Week 5:  Take 5 mg Every 3 days (Tue, Fri) Week 6:  Take 5 mg once week (Mon)  Dispense: 40 tablet; Refill: 0 - busPIRone (BUSPAR) 5 MG tablet; Take 1 tablet (5 mg total) by mouth 2 (two) times daily.  Dispense: 60 tablet; Refill: 0 - sertraline (ZOLOFT) 25 MG tablet; Take 0.5 tablets (12.5 mg total) by mouth daily.  Dispense: 15 tablet; Refill: 1  2. MDD (major depressive disorder), recurrent episode, moderate (  HCC) (Primary) - FLUoxetine (PROZAC) 10 MG tablet; Prozac Taper:  You are currently taking 30 mg daily Week one:  Take 20 mg daily for 7 days Week Two: Take 10 mg for 7 days  Weeks 3 - 6 will require cutting tablet in half.  (Cut the 10 mg tablet in half.  If your Prozac is a capsule and not a tablet a 10 mg tablet will be prescribed for you to assist with taper).    Week 3:  Take 5 mg for 7 days  Week 4:  Take 5 mg every other day (Sun, Tue, Thurs, Sat) Week 5:  Take 5 mg Every 3 days (Tue, Fri) Week 6:  Take 5 mg once week (Mon)  Dispense: 40 tablet; Refill: 0 - sertraline (ZOLOFT) 25 MG tablet; Take 0.5 tablets (12.5 mg total) by mouth daily.  Dispense: 15 tablet; Refill: 1    Collaboration of Care: Collaboration of Care: Medication Management AEB Medication adjustments and Primary Care Provider AEB Referral to PCP to address abnormal CBC, and CMP   Patient/Guardian was advised Release of Information must be obtained prior to any record release in order to collaborate their care with an outside provider. Patient/Guardian was advised if they have not already done so to contact the registration department to sign all  necessary forms in order for [place] to release information regarding their care.   Consent: Patient/Guardian gives verbal consent for treatment and assignment of benefits for services provided during this visit. Patient/Guardian expressed understanding and agreed to proceed.    Jonte Wollam, NP 12/03/2023, 4:28 PM

## 2023-12-22 ENCOUNTER — Other Ambulatory Visit (HOSPITAL_COMMUNITY): Payer: Self-pay | Admitting: Registered Nurse

## 2023-12-22 DIAGNOSIS — F331 Major depressive disorder, recurrent, moderate: Secondary | ICD-10-CM

## 2023-12-22 MED ORDER — BUPROPION HCL ER (XL) 300 MG PO TB24
300.0000 mg | ORAL_TABLET | Freq: Every day | ORAL | 0 refills | Status: DC
Start: 2023-12-22 — End: 2024-01-27

## 2024-01-05 ENCOUNTER — Encounter: Payer: Self-pay | Admitting: Family Medicine

## 2024-01-05 ENCOUNTER — Ambulatory Visit: Admitting: Family Medicine

## 2024-01-05 VITALS — BP 128/78 | HR 100 | Resp 16 | Ht 59.0 in | Wt 123.4 lb

## 2024-01-05 DIAGNOSIS — N1831 Chronic kidney disease, stage 3a: Secondary | ICD-10-CM

## 2024-01-05 DIAGNOSIS — D72829 Elevated white blood cell count, unspecified: Secondary | ICD-10-CM | POA: Diagnosis not present

## 2024-01-05 NOTE — Progress Notes (Signed)
 ACUTE VISIT Chief Complaint  Patient presents with   Results    Following up on elevated white blood cell count; was advised to follow with PCP   HPI: Jessica Casey is a 73 y.o. female with a PMHx significant for OSA, sensorineural hearing loss, osteoporosis, OA, CKD 3a, MDD, GAD, and depression, among some, who is here today complaining of elevated white blood count.   Patient is concerned because her WBC have been elevated for a few years. She has a hx of intermittent anemia over the last few years, as well as a hx of abnormal kidney function.  Denies any fever, night sweats, abnormal wt loss, or enlarged glands.   Alcohol: None Smoking hx: None     Latest Ref Rng & Units 11/11/2023    2:23 PM 01/23/2023   11:30 AM 08/29/2021   10:13 AM  CBC  WBC 3.4 - 10.8 x10E3/uL 13.8  11.9  13.2   Hemoglobin 11.1 - 15.9 g/dL 16.1  09.6  04.5   Hematocrit 34.0 - 46.6 % 36.4  35.6  36.3   Platelets 150 - 450 x10E3/uL 297  230  351    Colonoscopy 07/08/22.  Lab Results  Component Value Date   WBC 13.8 (H) 11/11/2023   HGB 12.0 11/11/2023   HCT 36.4 11/11/2023   MCV 99 (H) 11/11/2023   PLT 297 11/11/2023   CKD III: Negative for gross hematuria, foamy urine, decreased urine output. Last e GFR 54. She acknowledges sometimes she doe snot drink enough fluids.     Latest Ref Rng & Units 11/11/2023    2:23 PM 01/23/2023   11:30 AM 10/11/2022   10:40 AM  BMP  Glucose 70 - 99 mg/dL 89  96  94   BUN 8 - 27 mg/dL 31  19  23    Creatinine 0.57 - 1.00 mg/dL 4.09  8.11  9.14   BUN/Creat Ratio 12 - 28 28     Sodium 134 - 144 mmol/L 143  140  140   Potassium 3.5 - 5.2 mmol/L 4.3  3.9  4.1   Chloride 96 - 106 mmol/L 105  105  104   CO2 20 - 29 mmol/L 25  28  28    Calcium 8.7 - 10.3 mg/dL 9.3  9.1  9.6    Lab Results  Component Value Date   MICROALBUR 54.6 (H) 07/30/2021   MICROALBUR 1.6 08/06/2016   Review of Systems  Constitutional:  Negative for activity change and appetite change.   HENT:  Negative for mouth sores and sore throat.   Respiratory:  Negative for cough, shortness of breath and wheezing.   Cardiovascular:  Negative for leg swelling.  Gastrointestinal:  Negative for abdominal pain, nausea and vomiting.  Endocrine: Negative for cold intolerance and heat intolerance.  Skin:  Negative for rash.  Neurological:  Negative for syncope, facial asymmetry and weakness.  See other pertinent positives and negatives in HPI.  Current Outpatient Medications on File Prior to Visit  Medication Sig Dispense Refill   acetaminophen (TYLENOL) 650 MG CR tablet Take 1,300 mg by mouth every 8 (eight) hours as needed for pain.     Bacillus Coagulans-Inulin (ALIGN PREBIOTIC-PROBIOTIC PO) Take by mouth.     Biotin 1000 MCG tablet Take 1,000 mcg by mouth daily.     buPROPion (WELLBUTRIN XL) 300 MG 24 hr tablet Take 1 tablet (300 mg total) by mouth daily. 30 tablet 0   busPIRone (BUSPAR) 5 MG tablet Take 1 tablet (  5 mg total) by mouth 2 (two) times daily. 60 tablet 0   Calcium Carb-Cholecalciferol (CALCIUM 600 + D PO) Take 1 tablet by mouth daily.     Cholecalciferol (VITAMIN D) 2000 UNITS CAPS Take 2,000 Units by mouth daily.     Cyanocobalamin (B-12) 1000 MCG CAPS Take 1,000 mcg by mouth daily.     denosumab (PROLIA) 60 MG/ML SOSY injection Inject 60 mg into the skin every 6 (six) months. At MD office, last done 03/2022     diclofenac sodium (VOLTAREN) 1 % GEL Apply 2 g topically 4 (four) times daily. (Patient taking differently: Apply 2 g topically 4 (four) times daily as needed (pain).) 3 Tube 3   FLUoxetine (PROZAC) 10 MG tablet Prozac Taper:  You are currently taking 30 mg daily Week one:  Take 20 mg daily for 7 days Week Two: Take 10 mg for 7 days  Weeks 3 - 6 will require cutting tablet in half.  (Cut the 10 mg tablet in half.  If your Prozac is a capsule and not a tablet a 10 mg tablet will be prescribed for you to assist with taper).    Week 3:  Take 5 mg for 7 days  Week  4:  Take 5 mg every other day (Sun, Tue, Thurs, Sat) Week 5:  Take 5 mg Every 3 days (Tue, Fri) Week 6:  Take 5 mg once week (Mon) 40 tablet 0   Multiple Vitamin (MULTIVITAMIN WITH MINERALS) TABS tablet Take 1 tablet by mouth daily.     omeprazole (PRILOSEC) 40 MG capsule TAKE 1 CAPSULE (40 MG TOTAL) BY MOUTH DAILY. 90 capsule 1   sertraline (ZOLOFT) 25 MG tablet Take 0.5 tablets (12.5 mg total) by mouth daily. 15 tablet 1   Current Facility-Administered Medications on File Prior to Visit  Medication Dose Route Frequency Provider Last Rate Last Admin   [START ON 03/12/2024] denosumab (PROLIA) injection 60 mg  60 mg Subcutaneous Once Carlus Pavlov, MD        Past Medical History:  Diagnosis Date   Anxiety    Cataract    bilateral removed   Colonic polyp 2005 & 2013   DDD (degenerative disc disease)    Depression    Diverticulitis large intestine 12/12/2022   GAD (generalized anxiety disorder) 11/11/2023   GERD (gastroesophageal reflux disease)    H/O echocardiogram    before 2000, no need for f/u /w cardiac    Heart murmur    MVP- echo- 2010, no symptoms    MDD (major depressive disorder), recurrent episode, moderate (HCC) 11/11/2023   Memory disorder 09/19/2017   Mononucleosis 1971   Osteopenia after menopause    Ovarian mass, left 12/12/2022   See MRI 12/12/22   Sleep apnea    cpap   Allergies  Allergen Reactions   Gabapentin     Tongue swelling and itching     Social History   Socioeconomic History   Marital status: Married    Spouse name: Not on file   Number of children: Not on file   Years of education: 16+   Highest education level: Master's degree (e.g., MA, MS, MEng, MEd, MSW, MBA)  Occupational History   Occupation: Runner, broadcasting/film/video- Retired  Tobacco Use   Smoking status: Never   Smokeless tobacco: Never  Vaping Use   Vaping status: Never Used  Substance and Sexual Activity   Alcohol use: Never   Drug use: Never   Sexual activity: Not Currently  Partners: Male    Birth control/protection: Post-menopausal  Other Topics Concern   Not on file  Social History Narrative   Lives   Caffeine use:    Right handed    Social Drivers of Health   Financial Resource Strain: Low Risk  (11/10/2022)   Overall Financial Resource Strain (CARDIA)    Difficulty of Paying Living Expenses: Not hard at all  Food Insecurity: No Food Insecurity (12/16/2022)   Hunger Vital Sign    Worried About Running Out of Food in the Last Year: Never true    Ran Out of Food in the Last Year: Never true  Transportation Needs: No Transportation Needs (12/16/2022)   PRAPARE - Administrator, Civil Service (Medical): No    Lack of Transportation (Non-Medical): No  Physical Activity: Insufficiently Active (11/10/2022)   Exercise Vital Sign    Days of Exercise per Week: 3 days    Minutes of Exercise per Session: 30 min  Stress: Patient Declined (11/10/2022)   [employer] of Occupational Health - Occupational Stress Questionnaire    Feeling of Stress : Patient declined  Social Connections: Socially Integrated (11/10/2022)   Social Connection and Isolation Panel [NHANES]    Frequency of Communication with Friends and Family: More than three times a week    Frequency of Social Gatherings with Friends and Family: Patient declined    Attends Religious Services: More than 4 times per year    Active Member of [employer] or Organizations: Yes    Attends Banker Meetings: More than 4 times per year    Marital Status: Married    Vitals:   01/05/24 1249  BP: 128/78  Pulse: 100  Resp: 16  SpO2: 93%   Body mass index is 24.92 kg/m.  Physical Exam Vitals and nursing note reviewed.  Constitutional:      General: She is not in acute distress.    Appearance: She is well-developed.  HENT:     Head: Normocephalic and atraumatic.     Mouth/Throat:     Mouth: Mucous membranes are moist.     Pharynx: Oropharynx is clear. Uvula midline.  Eyes:      Conjunctiva/sclera: Conjunctivae normal.  Cardiovascular:     Rate and Rhythm: Normal rate and regular rhythm.     Heart sounds: No murmur heard. Pulmonary:     Effort: Pulmonary effort is normal. No respiratory distress.     Breath sounds: Normal breath sounds.  Abdominal:     Palpations: Abdomen is soft. There is no hepatomegaly or mass.     Tenderness: There is no abdominal tenderness.  Musculoskeletal:     Right lower leg: No edema.     Left lower leg: No edema.  Lymphadenopathy:     Cervical: No cervical adenopathy.     Upper Body:     Right upper body: No supraclavicular adenopathy.     Left upper body: No supraclavicular adenopathy.  Skin:    General: Skin is warm.     Findings: No erythema or rash.  Neurological:     General: No focal deficit present.     Mental Status: She is alert and oriented to person, place, and time.     Cranial Nerves: No cranial nerve deficit.     Gait: Gait normal.  Psychiatric:        Mood and Affect: Mood and affect normal.   ASSESSMENT AND PLAN:  Ms. Caraveo was seen today for elevated white blood count.  Leukocytosis, unspecified type Mildly elevated WBC's for the past 2-3 years. Associated slightly low RBC's and intermittent anemia. We discussed possible etiologies. She has not had symptoms that are suggestive of a serious process. Recommend consultation with hematologist, referral placed.  -     Ambulatory referral to Hematology / Oncology  Stage 3a chronic kidney disease [facility]) Assessment & Plan: Problem has been stable, Cr 0.9-1.0 and e GFR 55-58 in the past couple years. Continue adequate hydration, low-salt diet, and avoidance of NSAIDs. BMP and microalb/Cr ratio can be repeated in 4-6 months.  Orders: -     Microalbumin / creatinine urine ratio; Future -     Basic metabolic panel with GFR; Future  I spent a total of 30 minutes in both face to face and non face to face activities for this visit on the date of this encounter.  During this time history was obtained and documented, examination was performed, prior labs/records reviewed, and assessment/plan discussed.  Return if symptoms worsen or fail to improve.  I, Rolla Etienne Wierda, acting as a scribe for Destin Vinsant [place], MD., have documented all relevant documentation on the behalf of Annalei Friesz [place], MD, as directed by  Artina Minella [place], MD while in the presence of Salomon Ganser [place], MD.   I, Azarya Oconnell [place], MD, have reviewed all documentation for this visit. The documentation on 01/05/24 for the exam, diagnosis, procedures, and orders are all accurate and complete.  Iesha Summerhill G. [place], MD  [facility]. [place] office.

## 2024-01-05 NOTE — Assessment & Plan Note (Addendum)
 Problem has been stable, Cr 0.9-1.0 and e GFR 55-58 in the past couple years. Continue adequate hydration, low-salt diet, and avoidance of NSAIDs. She prefers to hold on labs today because she has not have adequate water intake today, so BMP and microalb/Cr ratio can be repeated in 4-6 months.

## 2024-01-05 NOTE — Patient Instructions (Addendum)
 A few things to remember from today's visit:  Leukocytosis, unspecified type - Plan: Ambulatory referral to Hematology / Oncology  No changes today. We can repeat kidney test in 4-6 months.  If you need refills for medications you take chronically, please call your pharmacy. Do not use My Chart to request refills or for acute issues that need immediate attention. If you send a my chart message, it may take a few days to be addressed, specially if I am not in the office.  Please be sure medication list is accurate. If a new problem present, please set up appointment sooner than planned today.

## 2024-01-27 ENCOUNTER — Encounter (HOSPITAL_COMMUNITY): Payer: Self-pay | Admitting: Registered Nurse

## 2024-01-27 ENCOUNTER — Ambulatory Visit (INDEPENDENT_AMBULATORY_CARE_PROVIDER_SITE_OTHER): Admitting: Registered Nurse

## 2024-01-27 DIAGNOSIS — F331 Major depressive disorder, recurrent, moderate: Secondary | ICD-10-CM

## 2024-01-27 DIAGNOSIS — F411 Generalized anxiety disorder: Secondary | ICD-10-CM

## 2024-01-27 MED ORDER — BUPROPION HCL ER (XL) 300 MG PO TB24
300.0000 mg | ORAL_TABLET | Freq: Every day | ORAL | 1 refills | Status: DC
Start: 2024-01-27 — End: 2024-03-02

## 2024-01-27 MED ORDER — SERTRALINE HCL 25 MG PO TABS
25.0000 mg | ORAL_TABLET | Freq: Every day | ORAL | 1 refills | Status: DC
Start: 2024-01-27 — End: 2024-02-10

## 2024-01-27 MED ORDER — BUSPIRONE HCL 5 MG PO TABS
5.0000 mg | ORAL_TABLET | Freq: Two times a day (BID) | ORAL | 0 refills | Status: DC
Start: 2024-01-27 — End: 2024-02-10

## 2024-01-27 NOTE — Patient Instructions (Addendum)

## 2024-01-27 NOTE — Progress Notes (Signed)
 [facility] MD/PA/NP OP Progress Note  01/27/2024 2:34 PM Jessica Casey  MRN:  [id number]  Chief Complaint:  Chief Complaint  Patient presents with   Follow-up    Medication management   HPI: Jessica Casey 73 y.o. female presents office today to establish care for medication management follow up.  She is seen face to face by this provider, and chart reviewed on 01/27/24.  Her psychiatric history is significant for major depressive disorder and general anxiety disorder.  Her mental health is currently managed with Zoloft  25 mg daily, Wellbutrin  XL 300 mg daily, and Buspar  5 mg Bid.  Reports she has completed the Prozac  taper and did have brain zaps and continue to have on/off but is aware of what is happening.  She reports there has been some improvement in her depression and anxiety but has only been taking the 2.5 mg of Zoloft .   She denies suicidal/self-harm/homicidal ideation, psychosis, paranoia, and abnormal movements.  She states she is eating and sleeping without difficulty.     Recommended the following: Increase Zoloft  to 25 mg daily, continue Wellbutrin  XL 300 mg daily and Buspar  5 mg daily.  She voices understanding with information being given to her today and is agreeable to recommendations.    Visit Diagnosis:    ICD-10-CM   1. GAD (generalized anxiety disorder)  F41.1 sertraline  (ZOLOFT ) 25 MG tablet    busPIRone  (BUSPAR ) 5 MG tablet    2. MDD (major depressive disorder), recurrent episode, moderate (HCC)  F33.1 sertraline  (ZOLOFT ) 25 MG tablet    buPROPion  (WELLBUTRIN  XL) 300 MG 24 hr tablet      Past Psychiatric History: general anxiety, major depression  Past Medical History:  Past Medical History:  Diagnosis Date   Anxiety    Cataract    bilateral removed   Colonic polyp 2005 & 2013   DDD (degenerative disc disease)    Depression    Diverticulitis large intestine 12/12/2022   GAD (generalized anxiety disorder) 11/11/2023   GERD (gastroesophageal reflux disease)     H/O echocardiogram    before 2000, no need for f/u /w cardiac    Heart murmur    MVP- echo- 2010, no symptoms    MDD (major depressive disorder), recurrent episode, moderate (HCC) 11/11/2023   Memory disorder 09/19/2017   Mononucleosis 1971   Osteopenia after menopause    Ovarian mass, left 12/12/2022   See MRI 12/12/22   Sleep apnea    cpap    Past Surgical History:  Procedure Laterality Date   ANTERIOR CERVICAL DECOMP/DISCECTOMY FUSION N/A 05/20/2013   Procedure: ANTERIOR CERVICAL DECOMPRESSION/DISCECTOMY FUSION 2 LEVELS;  Surgeon: Manya Sells, MD;  Location: [facility];  Service: Neurosurgery;  Laterality: N/A;  Cervical Seven-Thoracic One, Thoracic One-Two Anterior cervical decompression/Diskectomy/Fusion   BACK SURGERY     CARPAL TUNNEL RELEASE Right    CATARACT EXTRACTION, BILATERAL  10/01/2011   Dr Gennie Kicks, Dyana Glade IOL   CERVICAL FUSION  2014   3 proceduresr Nigel Bart, 2007, 20012 and 2014   COLONOSCOPY  2018   SA- TA   colonoscopy with polypectomy  10/01/2011   Dr Arvie Latus   KNEE SURGERY     Dr Emil Harada ; bursa cystectomy   LUMBAR DISC SURGERY  2010   Dr.Stern and 1999   ROBOTIC ASSISTED TOTAL HYSTERECTOMY WITH BILATERAL SALPINGO OOPHERECTOMY Bilateral 01/28/2023   Procedure: XI ROBOTIC ASSISTED TOTAL HYSTERECTOMY WITH BILATERAL SALPINGO OOPHORECTOMY;  Surgeon: Derrel Flies, MD;  Location: [facility];  Service: Gynecology;  Laterality: Bilateral;  ROTATOR CUFF REPAIR     Dr.Wainer   TOTAL HIP ARTHROPLASTY Right 09/04/2021   Procedure: TOTAL HIP ARTHROPLASTY ANTERIOR APPROACH;  Surgeon: Saundra Curl, MD;  Location: [facility];  Service: Orthopedics;  Laterality: Right;   UMBILICAL HERNIA REPAIR  1952   as a baby   WRIST FRACTURE SURGERY Left 02/28/2013   Dr. Sandie Cross    Family Psychiatric History: See below in family history  Family History:  Family History  Problem Relation Age of Onset   Breast cancer Mother        estorgen receptor positive   Stroke Mother 30        Dec   Diabetes Father    Coronary artery disease Father        S/P CBAG , no MI -- Dec   Depression Father    Diverticulosis Father    Breast cancer Sister        estrogen receptor positive   Depression Sister    Depression Sister    Depression Sister    Depression Sister    Colon polyps Sister    Depression Brother    Colon cancer Maternal Grandfather    Bipolar disorder Other        Neice   Esophageal cancer Neg Hx    Pancreatic cancer Neg Hx    Rectal cancer Neg Hx    Stomach cancer Neg Hx    Ovarian cancer Neg Hx    Endometrial cancer Neg Hx     Social History:  Social History   Socioeconomic History   Marital status: Married    Spouse name: Not on file   Number of children: Not on file   Years of education: 16+   Highest education level: Master's degree (e.g., MA, MS, MEng, MEd, MSW, MBA)  Occupational History   Occupation: Runner, broadcasting/film/video- Retired  Tobacco Use   Smoking status: Never   Smokeless tobacco: Never  Vaping Use   Vaping status: Never Used  Substance and Sexual Activity   Alcohol use: Never   Drug use: Never   Sexual activity: Not Currently    Partners: Male    Birth control/protection: Post-menopausal  Other Topics Concern   Not on file  Social History Narrative   Lives   Caffeine use:    Right handed    Social Drivers of Health   Financial Resource Strain: Low Risk  (11/10/2022)   Overall Financial Resource Strain (CARDIA)    Difficulty of Paying Living Expenses: Not hard at all  Food Insecurity: No Food Insecurity (12/16/2022)   Hunger Vital Sign    Worried About Running Out of Food in the Last Year: Never true    Ran Out of Food in the Last Year: Never true  Transportation Needs: No Transportation Needs (12/16/2022)   PRAPARE - Administrator, Civil Service (Medical): No    Lack of Transportation (Non-Medical): No  Physical Activity: Insufficiently Active (11/10/2022)   Exercise Vital Sign    Days of Exercise per Week: 3  days    Minutes of Exercise per Session: 30 min  Stress: Patient Declined (11/10/2022)   [employer] of Occupational Health - Occupational Stress Questionnaire    Feeling of Stress : Patient declined  Social Connections: Socially Integrated (11/10/2022)   Social Connection and Isolation Panel [NHANES]    Frequency of Communication with Friends and Family: More than three times a week    Frequency of Social Gatherings with Friends and Family: Patient declined  Attends Religious Services: More than 4 times per year    Active Member of Clubs or Organizations: Yes    Attends Banker Meetings: More than 4 times per year    Marital Status: Married    Allergies:  Allergies  Allergen Reactions   Gabapentin     Tongue swelling and itching     Metabolic Disorder Labs: Lab Results  Component Value Date   HGBA1C 5.6 11/11/2023   No results found for: "PROLACTIN" Lab Results  Component Value Date   CHOL 194 11/11/2023   TRIG 85 11/11/2023   HDL 63 11/11/2023   CHOLHDL 3.1 11/11/2023   VLDL 28.4 08/08/2015   LDLCALC 116 (H) 11/11/2023   LDLCALC 107 (H) 08/08/2015   Lab Results  Component Value Date   TSH 2.940 11/11/2023   TSH 4.070 10/05/2021   Current Medications: Current Outpatient Medications  Medication Sig Dispense Refill   acetaminophen  (TYLENOL ) 650 MG CR tablet Take 1,300 mg by mouth every 8 (eight) hours as needed for pain.     Bacillus Coagulans-Inulin (ALIGN PREBIOTIC-PROBIOTIC PO) Take by mouth.     Biotin  1000 MCG tablet Take 1,000 mcg by mouth daily.     Calcium  Carb-Cholecalciferol  (CALCIUM  600 + D PO) Take 1 tablet by mouth daily.     Cholecalciferol  (VITAMIN D ) 2000 UNITS CAPS Take 2,000 Units by mouth daily.     Cyanocobalamin (B-12) 1000 MCG CAPS Take 1,000 mcg by mouth daily.     denosumab  (PROLIA ) 60 MG/ML SOSY injection Inject 60 mg into the skin every 6 (six) months. At MD office, last done 03/2022     diclofenac  sodium (VOLTAREN ) 1 %  GEL Apply 2 g topically 4 (four) times daily. (Patient taking differently: Apply 2 g topically 4 (four) times daily as needed (pain).) 3 Tube 3   Multiple Vitamin (MULTIVITAMIN WITH MINERALS) TABS tablet Take 1 tablet by mouth daily.     omeprazole  (PRILOSEC) 40 MG capsule TAKE 1 CAPSULE (40 MG TOTAL) BY MOUTH DAILY. 90 capsule 1   buPROPion  (WELLBUTRIN  XL) 300 MG 24 hr tablet Take 1 tablet (300 mg total) by mouth daily. 30 tablet 1   busPIRone  (BUSPAR ) 5 MG tablet Take 1 tablet (5 mg total) by mouth 2 (two) times daily. 60 tablet 0   sertraline  (ZOLOFT ) 25 MG tablet Take 1 tablet (25 mg total) by mouth daily. 30 tablet 1   Current Facility-Administered Medications  Medication Dose Route Frequency Provider Last Rate Last Admin   [START ON 03/12/2024] denosumab  (PROLIA ) injection 60 mg  60 mg Subcutaneous Once Gherghe, Cristina, MD         Musculoskeletal: Strength & Muscle Tone: within normal limits Gait & Station: normal Patient leans: N/A  Psychiatric Specialty Exam: Review of Systems  Constitutional:        No other complaints voiced  All other systems reviewed and are negative.   Blood pressure (!) 158/86, pulse 95, height 4\' 11"  (1.499 m), weight 124 lb (56.2 kg), last menstrual period 10/01/1995.Body mass index is 25.04 kg/m.  General Appearance: Casual and Neat  Eye Contact:  Good  Speech:  Clear and Coherent and Normal Rate  Volume:  Normal  Mood:  Euthymic  Affect:  Appropriate and Congruent  Thought Process:  Coherent, Goal Directed, and Descriptions of Associations: Intact  Orientation:  Full (Time, Place, and Person)  Thought Content: WDL and Logical   Suicidal Thoughts:  No  Homicidal Thoughts:  No  Memory:  Immediate;  Good Recent;   Good Remote;   Good  Judgement:  Intact  Insight:  Present  Psychomotor Activity:  Normal  Concentration:  Concentration: Good and Attention Span: Good  Recall:  Good  Fund of Knowledge: Good  Language: Good  Akathisia:  No   Handed:  Right  AIMS (if indicated): done  Assets:  Communication Skills Desire for Improvement Financial Resources/Insurance Housing Leisure Time Physical Health Resilience Social Support Transportation  ADL's:  Intact  Cognition: WNL  Sleep:  Good   Screenings: GAD-7    Flowsheet Row Office Visit from 11/11/2023 in [facility] Office Visit from 12/20/2022 in [facility] [place] Office Visit from 11/11/2022 in [facility] [place]  Total GAD-7 Score 21 9 4       Mini-Mental    Flowsheet Row Office Visit from 09/19/2017 in [facility]  Total Score (max 30 points ) 30      PHQ2-9    Flowsheet Row Office Visit from 11/11/2023 in [facility] Most recent reading at 11/11/2023  1:07 PM Video Visit from 04/24/2023 in [facility] [place] Most recent reading at 04/24/2023 10:51 AM Office Visit from 12/20/2022 in [facility] [place] Most recent reading at 12/20/2022 12:29 PM Office Visit from 12/30/2022 in [facility] Most recent reading at 12/16/2022  8:58 AM Office Visit from 11/11/2022 in [facility] [place] Most recent reading at 11/11/2022 10:59 AM  PHQ-2 Total Score 6 0 0 0 0  PHQ-9 Total Score 21 1 12  -- 5      Flowsheet Row Admission (Discharged) from 01/28/2023 in [place] Admission (Discharged) from 09/04/2021 in [place] [facility] Pre-Admission Testing 60 from 08/29/2021 in [facility]  C-SSRS RISK CATEGORY No Risk No Risk No Risk      Assessment and Plan: Assessment: Patient seen and examined as noted above. Summary: Today Jessica Casey appears to be doing well.  She reports medications are  effectively managing her mental health without any adverse reaction She reports improvement in anxiety.  She denies suicidal/self-harm/homicidal ideation, psychosis, paranoia, fluctuations in mood, and abnormal movements.    During visit she is dressed appropriate for age and weather.  She is seated comfortably in chair no noted distress.  She is alert/oriented x 4, calm/cooperative and mood is congruent with affect.  She spoke in a clear tone at moderate volume, and normal pace, with good eye contact.  Her thought process is coherent, relevant, and there is no indication that she is currently responding to internal/external stimuli or experiencing delusional thought content.    1. GAD (generalized anxiety disorder) - sertraline  (ZOLOFT ) 25 MG tablet; Take 1 tablet (25 mg total) by mouth daily.  Dispense: 30 tablet; Refill: 1 - busPIRone  (BUSPAR ) 5 MG tablet; Take 1 tablet (5 mg total) by mouth 2 (two) times daily.  Dispense: 60 tablet; Refill: 0  2. MDD (major depressive disorder), recurrent episode, moderate (HCC) - sertraline  (ZOLOFT ) 25 MG tablet; Take 1 tablet (25 mg total) by mouth daily.  Dispense: 30 tablet; Refill: 1 - buPROPion  (WELLBUTRIN  XL) 300 MG 24 hr tablet; Take 1 tablet (300 mg total) by mouth daily.  Dispense: 30 tablet; Refill: 1    Plan: Medications: Meds ordered this encounter  Medications   sertraline  (ZOLOFT ) 25 MG tablet    Sig: Take 1 tablet (25 mg total) by mouth daily.    Dispense:  30 tablet    Refill:  1    Supervising Provider:   ARFEEN, SYED T [[id number]]   buPROPion  (WELLBUTRIN  XL) 300 MG 24 hr tablet    Sig: Take 1 tablet (300 mg total) by mouth daily.    Dispense:  30 tablet    Refill:  1    Supervising Provider:   Carlos Chesterfield, SYED T [[id number]]   busPIRone  (BUSPAR ) 5 MG tablet    Sig: Take 1 tablet (5 mg total) by mouth 2 (two) times daily.    Dispense:  60 tablet    Refill:  0    Supervising Provider:   Eduard Grad T [[id number]]    Labs:  Not indicated at this  time   Other:  Declines counseling/therapy at this time.   Jessica Casey is instructed to call [phone], mobile crisis, or present to the nearest emergency room should she experience any suicidal/homicidal ideation, auditory/visual/hallucinations, or detrimental worsening of her mental health condition.   Jessica Casey has participated in the development of this treatment plan and verbalized her agreement with plan as listed.  Follow Up: Return in 2 weeks Call in the interim for any side-effects, decompensation, questions, or problems  Collaboration of Care: Collaboration of Care: Medication Management AEB Medication adjustment and refills  Patient/Guardian was advised Release of Information must be obtained prior to any record release in order to collaborate their care with an outside provider. Patient/Guardian was advised if they have not already done so to contact the registration department to sign all necessary forms in order for us  to release information regarding their care.   Consent: Patient/Guardian gives verbal consent for treatment and assignment of benefits for services provided during this visit. Patient/Guardian expressed understanding and agreed to proceed.    Jessica Embry, NP 01/27/2024, 2:34 PM

## 2024-01-29 ENCOUNTER — Inpatient Hospital Stay

## 2024-01-29 ENCOUNTER — Inpatient Hospital Stay: Attending: Hematology | Admitting: Hematology

## 2024-01-29 ENCOUNTER — Encounter: Payer: Self-pay | Admitting: Hematology

## 2024-01-29 VITALS — BP 134/65 | HR 107 | Temp 98.2°F | Resp 16 | Ht 59.0 in | Wt 124.9 lb

## 2024-01-29 DIAGNOSIS — D72829 Elevated white blood cell count, unspecified: Secondary | ICD-10-CM | POA: Diagnosis present

## 2024-01-29 DIAGNOSIS — Z803 Family history of malignant neoplasm of breast: Secondary | ICD-10-CM

## 2024-01-29 DIAGNOSIS — G5602 Carpal tunnel syndrome, left upper limb: Secondary | ICD-10-CM | POA: Diagnosis not present

## 2024-01-29 DIAGNOSIS — D47Z9 Other specified neoplasms of uncertain behavior of lymphoid, hematopoietic and related tissue: Secondary | ICD-10-CM | POA: Diagnosis not present

## 2024-01-29 DIAGNOSIS — D72820 Lymphocytosis (symptomatic): Secondary | ICD-10-CM | POA: Diagnosis not present

## 2024-01-29 DIAGNOSIS — F32A Depression, unspecified: Secondary | ICD-10-CM

## 2024-01-29 LAB — CBC WITH DIFFERENTIAL/PLATELET
Abs Immature Granulocytes: 0.03 10*3/uL (ref 0.00–0.07)
Basophils Absolute: 0.1 10*3/uL (ref 0.0–0.1)
Basophils Relative: 0 %
Eosinophils Absolute: 0.1 10*3/uL (ref 0.0–0.5)
Eosinophils Relative: 1 %
HCT: 37.2 % (ref 36.0–46.0)
Hemoglobin: 12.3 g/dL (ref 12.0–15.0)
Immature Granulocytes: 0 %
Lymphocytes Relative: 74 %
Lymphs Abs: 11 10*3/uL — ABNORMAL HIGH (ref 0.7–4.0)
MCH: 32.3 pg (ref 26.0–34.0)
MCHC: 33.1 g/dL (ref 30.0–36.0)
MCV: 97.6 fL (ref 80.0–100.0)
Monocytes Absolute: 0.8 10*3/uL (ref 0.1–1.0)
Monocytes Relative: 5 %
Neutro Abs: 3 10*3/uL (ref 1.7–7.7)
Neutrophils Relative %: 20 %
Platelets: 250 10*3/uL (ref 150–400)
RBC: 3.81 MIL/uL — ABNORMAL LOW (ref 3.87–5.11)
RDW: 13.4 % (ref 11.5–15.5)
Smear Review: NORMAL
WBC: 14.9 10*3/uL — ABNORMAL HIGH (ref 4.0–10.5)
nRBC: 0 % (ref 0.0–0.2)

## 2024-01-29 LAB — COMPREHENSIVE METABOLIC PANEL WITH GFR
ALT: 19 U/L (ref 0–44)
AST: 25 U/L (ref 15–41)
Albumin: 4.3 g/dL (ref 3.5–5.0)
Alkaline Phosphatase: 70 U/L (ref 38–126)
Anion gap: 4 — ABNORMAL LOW (ref 5–15)
BUN: 19 mg/dL (ref 8–23)
CO2: 32 mmol/L (ref 22–32)
Calcium: 9.2 mg/dL (ref 8.9–10.3)
Chloride: 104 mmol/L (ref 98–111)
Creatinine, Ser: 1.24 mg/dL — ABNORMAL HIGH (ref 0.44–1.00)
GFR, Estimated: 46 mL/min — ABNORMAL LOW (ref >60)
Glucose, Bld: 82 mg/dL (ref 70–99)
Potassium: 4.2 mmol/L (ref 3.5–5.1)
Sodium: 140 mmol/L (ref 135–145)
Total Bilirubin: 0.4 mg/dL (ref 0.0–1.2)
Total Protein: 7.1 g/dL (ref 6.5–8.1)

## 2024-01-30 ENCOUNTER — Telehealth: Payer: Self-pay

## 2024-01-30 NOTE — Progress Notes (Signed)
 [facility]   Telephone:(336) [phone] Fax:(336) [phone]   Clinic New Consult Note   Patient Care Team: [place], [name], MD as PCP - General (Family Medicine) Greta Leatherwood, MD as Consulting Physician (Obstetrics and Gynecology) 01/29/2024  CHIEF COMPLAINTS/PURPOSE OF CONSULTATION:  Leukocytosis  REFERRING PHYSICIAN: [place], [name], MD   Discussed the use of AI scribe software for clinical note transcription with the patient, who gave verbal consent to proceed.  History of Present Illness Jessica Casey is a 73 year old female who presents with leukocytosis. She was referred by her family doctor, Dr. Swaziland, due to concerns about elevated white blood cell count and low red blood cell count.  Leukocytosis has been present for at least three years, with a white blood cell count of 13.8 and elevated lymphocytes 9.8K on 11/11/2023. Hg was previously low in range of 11-12, but hemoglobin was normal at 12 in February. Platelet count is normal. She denies significant weight loss, severe night sweats, or drenching night sweats, though she experiences some moisture in her nightgown. Fatigue is present, attributed to her busy schedule.  Her medical history includes treatment for depression, sleep apnea, osteopenia, and multiple back and cervical neck surgeries. She had a benign ovarian cyst removed last year. Current medications include Wellbutrin , Buspar , Zoloft , omeprazole , biotin , calcium , vitamin D , B12, and Prolia .  Family history is significant for breast cancer in her mother, diagnosed at age 61, and her sister, diagnosed at age 49. She discontinued hormone replacement therapy in her mid-forties due to this history. She is married, has two children, and helps care for a special needs grandchild.     MEDICAL HISTORY:  Past Medical History:  Diagnosis Date   Anxiety    Cataract    bilateral removed   Colonic polyp 2005 & 2013   DDD (degenerative disc disease)     Depression    Diverticulitis large intestine 12/12/2022   GAD (generalized anxiety disorder) 11/11/2023   GERD (gastroesophageal reflux disease)    H/O echocardiogram    before 2000, no need for f/u /w cardiac    Heart murmur    MVP- echo- 2010, no symptoms    MDD (major depressive disorder), recurrent episode, moderate (HCC) 11/11/2023   Memory disorder 09/19/2017   Mononucleosis 1971   Osteopenia after menopause    Ovarian mass, left 12/12/2022   See MRI 12/12/22   Sleep apnea    cpap    SURGICAL HISTORY: Past Surgical History:  Procedure Laterality Date   ANTERIOR CERVICAL DECOMP/DISCECTOMY FUSION N/A 05/20/2013   Procedure: ANTERIOR CERVICAL DECOMPRESSION/DISCECTOMY FUSION 2 LEVELS;  Surgeon: Manya Sells, MD;  Location: [facility];  Service: Neurosurgery;  Laterality: N/A;  Cervical Seven-Thoracic One, Thoracic One-Two Anterior cervical decompression/Diskectomy/Fusion   BACK SURGERY     CARPAL TUNNEL RELEASE Right    CATARACT EXTRACTION, BILATERAL  10/01/2011   Dr Gennie Kicks, Dyana Glade IOL   CERVICAL FUSION  2014   3 proceduresr Nigel Bart, 2007, 20012 and 2014   COLONOSCOPY  2018   SA- TA   colonoscopy with polypectomy  10/01/2011   Dr Arvie Latus   KNEE SURGERY     Dr Emil Harada ; bursa cystectomy   LUMBAR DISC SURGERY  2010   Dr.Stern and 1999   ROBOTIC ASSISTED TOTAL HYSTERECTOMY WITH BILATERAL SALPINGO OOPHERECTOMY Bilateral 01/28/2023   Procedure: XI ROBOTIC ASSISTED TOTAL HYSTERECTOMY WITH BILATERAL SALPINGO OOPHORECTOMY;  Surgeon: Derrel Flies, MD;  Location: [facility];  Service: Gynecology;  Laterality: Bilateral;  ROTATOR CUFF REPAIR     Dr.Wainer   TOTAL HIP ARTHROPLASTY Right 09/04/2021   Procedure: TOTAL HIP ARTHROPLASTY ANTERIOR APPROACH;  Surgeon: Saundra Curl, MD;  Location: [facility];  Service: Orthopedics;  Laterality: Right;   UMBILICAL HERNIA REPAIR  1952   as a baby   WRIST FRACTURE SURGERY Left 02/28/2013   Dr. Sandie Cross    SOCIAL HISTORY: Social  History   Socioeconomic History   Marital status: Married    Spouse name: Not on file   Number of children: 2   Years of education: 16+   Highest education level: Master's degree (e.g., MA, MS, MEng, MEd, MSW, MBA)  Occupational History   Occupation: Runner, broadcasting/film/video- Retired  Tobacco Use   Smoking status: Never   Smokeless tobacco: Never  Vaping Use   Vaping status: Never Used  Substance and Sexual Activity   Alcohol use: Never   Drug use: Never   Sexual activity: Not Currently    Partners: Male    Birth control/protection: Post-menopausal  Other Topics Concern   Not on file  Social History Narrative   Lives   Caffeine use:    Right handed    Social Drivers of Health   Financial Resource Strain: Low Risk  (11/10/2022)   Overall Financial Resource Strain (CARDIA)    Difficulty of Paying Living Expenses: Not hard at all  Food Insecurity: No Food Insecurity (12/16/2022)   Hunger Vital Sign    Worried About Running Out of Food in the Last Year: Never true    Ran Out of Food in the Last Year: Never true  Transportation Needs: No Transportation Needs (12/16/2022)   PRAPARE - Administrator, Civil Service (Medical): No    Lack of Transportation (Non-Medical): No  Physical Activity: Insufficiently Active (11/10/2022)   Exercise Vital Sign    Days of Exercise per Week: 3 days    Minutes of Exercise per Session: 30 min  Stress: Patient Declined (11/10/2022)   [employer] of Occupational Health - Occupational Stress Questionnaire    Feeling of Stress : Patient declined  Social Connections: Socially Integrated (11/10/2022)   Social Connection and Isolation Panel [NHANES]    Frequency of Communication with Friends and Family: More than three times a week    Frequency of Social Gatherings with Friends and Family: Patient declined    Attends Religious Services: More than 4 times per year    Active Member of Clubs or Organizations: Yes    Attends Banker  Meetings: More than 4 times per year    Marital Status: Married  Catering manager Violence: Not At Risk (12/16/2022)   Humiliation, Afraid, Rape, and Kick questionnaire    Fear of Current or Ex-Partner: No    Emotionally Abused: No    Physically Abused: No    Sexually Abused: No    FAMILY HISTORY: Family History  Problem Relation Age of Onset   Breast cancer Mother 76       estorgen receptor positive   Stroke Mother 81       Dec   Diabetes Father    Coronary artery disease Father        S/P CBAG , no MI -- Dec   Depression Father    Diverticulosis Father    Breast cancer Sister 32       estrogen receptor positive   Depression Sister    Depression Sister    Depression Sister    Depression Sister  Colon polyps Sister    Depression Brother    Colon cancer Maternal Grandfather    Bipolar disorder Other        Neice   Esophageal cancer Neg Hx    Pancreatic cancer Neg Hx    Rectal cancer Neg Hx    Stomach cancer Neg Hx    Ovarian cancer Neg Hx    Endometrial cancer Neg Hx     ALLERGIES:  is allergic to gabapentin.  MEDICATIONS:  Current Outpatient Medications  Medication Sig Dispense Refill   acetaminophen  (TYLENOL ) 650 MG CR tablet Take 1,300 mg by mouth every 8 (eight) hours as needed for pain.     Bacillus Coagulans-Inulin (ALIGN PREBIOTIC-PROBIOTIC PO) Take by mouth.     Biotin  1000 MCG tablet Take 1,000 mcg by mouth daily.     buPROPion  (WELLBUTRIN  XL) 300 MG 24 hr tablet Take 1 tablet (300 mg total) by mouth daily. 30 tablet 1   busPIRone  (BUSPAR ) 5 MG tablet Take 1 tablet (5 mg total) by mouth 2 (two) times daily. 60 tablet 0   Calcium  Carb-Cholecalciferol  (CALCIUM  600 + D PO) Take 1 tablet by mouth daily.     Cholecalciferol  (VITAMIN D ) 2000 UNITS CAPS Take 2,000 Units by mouth daily.     Cyanocobalamin (B-12) 1000 MCG CAPS Take 1,000 mcg by mouth daily.     denosumab  (PROLIA ) 60 MG/ML SOSY injection Inject 60 mg into the skin every 6 (six) months. At MD  office, last done 03/2022     diclofenac  sodium (VOLTAREN ) 1 % GEL Apply 2 g topically 4 (four) times daily. (Patient taking differently: Apply 2 g topically 4 (four) times daily as needed (pain).) 3 Tube 3   Multiple Vitamin (MULTIVITAMIN WITH MINERALS) TABS tablet Take 1 tablet by mouth daily.     omeprazole  (PRILOSEC) 40 MG capsule TAKE 1 CAPSULE (40 MG TOTAL) BY MOUTH DAILY. 90 capsule 1   sertraline  (ZOLOFT ) 25 MG tablet Take 1 tablet (25 mg total) by mouth daily. 30 tablet 1   Current Facility-Administered Medications  Medication Dose Route Frequency Provider Last Rate Last Admin   [START ON 03/12/2024] denosumab  (PROLIA ) injection 60 mg  60 mg Subcutaneous Once Gherghe, Cristina, MD        REVIEW OF SYSTEMS:   Constitutional: Denies fevers, chills or abnormal night sweats Eyes: Denies blurriness of vision, double vision or watery eyes Ears, nose, mouth, throat, and face: Denies mucositis or sore throat Respiratory: Denies cough, dyspnea or wheezes Cardiovascular: Denies palpitation, chest discomfort or lower extremity swelling Gastrointestinal:  Denies nausea, heartburn or change in bowel habits Skin: Denies abnormal skin rashes Lymphatics: Denies new lymphadenopathy or easy bruising Neurological:Denies numbness, tingling or new weaknesses Behavioral/Psych: Mood is stable, no new changes  All other systems were reviewed with the patient and are negative.  PHYSICAL EXAMINATION: ECOG PERFORMANCE STATUS: 0 - Asymptomatic  Vitals:   01/29/24 1418  BP: 134/65  Pulse: (!) 107  Resp: 16  Temp: 98.2 F (36.8 C)  SpO2: 97%   Filed Weights   01/29/24 1418  Weight: 124 lb 14.4 oz (56.7 kg)    GENERAL:alert, no distress and comfortable SKIN: skin color, texture, turgor are normal, no rashes or significant lesions EYES: normal, conjunctiva are pink and non-injected, sclera clear OROPHARYNX:no exudate, no erythema and lips, buccal mucosa, and tongue normal  NECK: supple, thyroid   normal size, non-tender, without nodularity LYMPH:  no palpable lymphadenopathy in the cervical, axillary or inguinal LUNGS: clear to auscultation and percussion  with normal breathing effort HEART: regular rate & rhythm and no murmurs and no lower extremity edema ABDOMEN:abdomen soft, non-tender and normal bowel sounds Musculoskeletal:no cyanosis of digits and no clubbing  PSYCH: alert & oriented x 3 with fluent speech NEURO: no focal motor/sensory deficits  Physical Exam NECK: No lymphadenopathy ABDOMEN: Liver normal  LABORATORY DATA:  I have reviewed the data as listed    Latest Ref Rng & Units 01/29/2024    3:13 PM 11/11/2023    2:23 PM 01/23/2023   11:30 AM  CBC  WBC 4.0 - 10.5 K/uL 14.9  13.8  11.9   Hemoglobin 12.0 - 15.0 g/dL 81.1  91.4  78.2   Hematocrit 36.0 - 46.0 % 37.2  36.4  35.6   Platelets 150 - 400 K/uL 250  297  230       Latest Ref Rng & Units 01/29/2024    3:13 PM 11/11/2023    2:23 PM 01/23/2023   11:30 AM  CMP  Glucose 70 - 99 mg/dL 82  89  96   BUN 8 - 23 mg/dL 19  31  19    Creatinine 0.44 - 1.00 mg/dL 9.56  2.13  0.86   Sodium 135 - 145 mmol/L 140  143  140   Potassium 3.5 - 5.1 mmol/L 4.2  4.3  3.9   Chloride 98 - 111 mmol/L 104  105  105   CO2 22 - 32 mmol/L 32  25  28   Calcium  8.9 - 10.3 mg/dL 9.2  9.3  9.1   Total Protein 6.5 - 8.1 g/dL 7.1  6.6  6.9   Total Bilirubin 0.0 - 1.2 mg/dL 0.4  0.4  0.5   Alkaline Phos 38 - 126 U/L 70  80  55   AST 15 - 41 U/L 25  19  18    ALT 0 - 44 U/L 19  16  14       RADIOGRAPHIC STUDIES: I have personally reviewed the radiological images as listed and agreed with the findings in the report. No results found.   Assessment & Plan Lymphocytosis, likely chronic lymphocytic leukemia (CLL) Elevated white blood cell count since 2022 with lymphocytosis three times higher than normal. Mild intermittent anemia, normal platelet count. No significant symptoms such as weight loss, severe fatigue, or night sweats.  Differential diagnosis includes reactive lymphocytosis due to allergy or inflammatory disease, but CLL is more likely given chronicity. CLL is often benign and may not require treatment. Severe symptoms or significant disease progression may necessitate treatment, typically oral medication rather than chemotherapy. - Order flow cytometry to confirm CLL diagnosis - Follow up in 1-2 weeks to discuss test results - If CLL is confirmed, she has stage 0 disease and she does not require treatment.  I recommend follow-up every 4 months initially, then every 6 months if well-managed  Carpal tunnel syndrome Carpal tunnel syndrome in the left hand causing numbness when holding objects. Symptoms improve with position change.  Depression Depression managed with Wellbutrin , Buspar , and Zoloft . Recently resumed medication after brief discontinuation.  Plan - Labs today, including flow cytometry for CLL diagnosis  - Phone visit in 2 to 3 weeks to review results.     Orders Placed This Encounter  Procedures   CBC with Differential/Platelet    Standing Status:   Standing    Number of Occurrences:   50    Expiration Date:   01/28/2025   Comprehensive metabolic panel with GFR    Standing Status:  Future    Number of Occurrences:   1    Expected Date:   01/29/2024    Expiration Date:   01/28/2025   Flow Cytometry, Peripheral Blood (Oncology)    CLL panel    Standing Status:   Future    Number of Occurrences:   1    Expected Date:   01/29/2024    Expiration Date:   01/28/2025    All questions were answered. The patient knows to call the clinic with any problems, questions or concerns. I spent 30 minutes counseling the patient face to face. The total time spent in the appointment was 35 minutes and more than 50% was on counseling.     Sonja Sherman, MD 01/29/2024

## 2024-01-30 NOTE — Telephone Encounter (Addendum)
 Called patient as per Dr.feng to relay the below results, patient voiced full understanding and had no further questions at this time.  ----- Message from Sonja [place] sent at 01/30/2024  2:40 PM EDT ----- Please let pt know that her ALC is high, waiting for flow cytometry to confirm if she has CLL. Her cr is slightly elevated, make sure she drinks more fluids to avoid dehydration. Thanks   Sonja [place]

## 2024-02-04 LAB — SURGICAL PATHOLOGY

## 2024-02-05 LAB — FLOW CYTOMETRY

## 2024-02-10 ENCOUNTER — Ambulatory Visit (INDEPENDENT_AMBULATORY_CARE_PROVIDER_SITE_OTHER): Admitting: Registered Nurse

## 2024-02-10 ENCOUNTER — Encounter (HOSPITAL_COMMUNITY): Payer: Self-pay | Admitting: Registered Nurse

## 2024-02-10 ENCOUNTER — Inpatient Hospital Stay (HOSPITAL_BASED_OUTPATIENT_CLINIC_OR_DEPARTMENT_OTHER): Admitting: Hematology

## 2024-02-10 DIAGNOSIS — F411 Generalized anxiety disorder: Secondary | ICD-10-CM | POA: Diagnosis not present

## 2024-02-10 DIAGNOSIS — C9110 Chronic lymphocytic leukemia of B-cell type not having achieved remission: Secondary | ICD-10-CM | POA: Diagnosis not present

## 2024-02-10 DIAGNOSIS — Z803 Family history of malignant neoplasm of breast: Secondary | ICD-10-CM | POA: Diagnosis not present

## 2024-02-10 DIAGNOSIS — G5602 Carpal tunnel syndrome, left upper limb: Secondary | ICD-10-CM | POA: Diagnosis not present

## 2024-02-10 DIAGNOSIS — F331 Major depressive disorder, recurrent, moderate: Secondary | ICD-10-CM

## 2024-02-10 DIAGNOSIS — F32A Depression, unspecified: Secondary | ICD-10-CM | POA: Diagnosis not present

## 2024-02-10 DIAGNOSIS — D72820 Lymphocytosis (symptomatic): Secondary | ICD-10-CM | POA: Diagnosis not present

## 2024-02-10 MED ORDER — BUSPIRONE HCL 5 MG PO TABS
5.0000 mg | ORAL_TABLET | Freq: Three times a day (TID) | ORAL | 1 refills | Status: DC
Start: 2024-02-10 — End: 2024-03-02

## 2024-02-10 MED ORDER — HYDROXYZINE HCL 10 MG PO TABS: 10.0000 mg | ORAL_TABLET | Freq: Three times a day (TID) | ORAL | 1 refills | Status: DC | PRN

## 2024-02-10 MED ORDER — SERTRALINE HCL 25 MG PO TABS
25.0000 mg | ORAL_TABLET | Freq: Every day | ORAL | 1 refills | Status: DC
Start: 2024-02-10 — End: 2024-03-02

## 2024-02-10 NOTE — Assessment & Plan Note (Signed)
-  stage 0 - Diagnosed in early May 2025.  ALC 11K, no anemia or thrombocytopenia patient was asymptomatic.  - Presented with leukocytosis with predominant lymphocytes since 2022 - No treatment needed at this point, will continue monitoring closely.

## 2024-02-10 NOTE — Patient Instructions (Signed)

## 2024-02-10 NOTE — Progress Notes (Signed)
 [facility]   Telephone:(336) [phone] Fax:(336) [phone]   Clinic Follow up Note   Patient Care Team: [place], [name], MD as PCP - General (Family Medicine) Jessica Leatherwood, MD as Consulting Physician (Obstetrics and Gynecology) 02/10/2024  I connected with Jessica Casey on 02/10/24 at 10:00 AM EDT by telephone and verified that I am speaking with the correct person using two identifiers.   I discussed the limitations, risks, security and privacy concerns of performing an evaluation and management service by telephone and the availability of in person appointments. I also discussed with the patient that there may be a patient responsible charge related to this service. The patient expressed understanding and agreed to proceed.   Patient's location:  Home  Provider's location:  Office    CHIEF COMPLAINT: Follow-up of newly diagnosed CLL   CURRENT THERAPY: Observation  Oncology history CLL (chronic lymphocytic leukemia) (HCC) -stage 0 - Diagnosed in early May 2025.  ALC 11K, no anemia or thrombocytopenia patient was asymptomatic.  - Presented with leukocytosis with predominant lymphocytes since 2022 - No treatment needed at this point, will continue monitoring closely.  Assessment & Plan Chronic lymphocytic leukemia (CLL) Chronic lymphocytic leukemia confirmed by flow cytometry. Lymphocyte count elevated at 11 (normal up to 4), with a total white blood cell count of 14.9, up from 13.6 two years ago, indicating slow progression. No anemia, thrombocytopenia, or symptoms such as night sweats, fatigue, or significant weight loss. No lymphadenopathy, splenomegaly, or hepatomegaly. Current condition does not require treatment but needs monitoring. Discussed potential future treatment indications, including symptoms, anemia, thrombocytopenia, and rapid lymphocyte count increase. Explained CLL's indolent nature, often not requiring treatment for many years, if at all.  If treatment is needed, it typically starts with oral targeted therapy rather than chemotherapy. Most individuals live with CLL without treatment for extended periods. - Monitor blood counts every three months. - Schedule follow-up visit in six months. - Educate on symptoms to report, including significant weight loss, night sweats, fatigue, and changes in blood counts. - No current need for bone marrow biopsy or CT scan unless symptoms develop. - Contact if blood counts from other doctors show significant changes.  Plan - I reviewed her recent lab test results, which confirmed CLL - Treatment is not needed at this point, I recommend observation - Lab every 3 months, follow-up in 6 months   Discussed the use of AI scribe software for clinical note transcription with the patient, who gave verbal consent to proceed.  History of Present Illness Jessica Casey is a 73 year old female with chronic lymphocytic leukemia (CLL) who presents for a phone visit to review lab results.  Recent blood tests show an elevated lymphocyte count of 11 and a total white blood cell count of 14.9, compared to 13.6 two years ago. She remains asymptomatic with no night sweats, fatigue, or unintentional weight loss. There is no anemia or thrombocytopenia. No lymphadenopathy, splenomegaly, or hepatomegaly is noted. She is not on treatment for CLL as her blood counts are stable. A previous white blood cell count was 13.6 two years ago. No bone marrow biopsy or CT scan has been performed as she is not indicated.     REVIEW OF SYSTEMS:   Constitutional: Denies fevers, chills or abnormal weight loss Eyes: Denies blurriness of vision Ears, nose, mouth, throat, and face: Denies mucositis or sore throat Respiratory: Denies cough, dyspnea or wheezes Cardiovascular: Denies palpitation, chest discomfort or lower extremity swelling  Gastrointestinal:  Denies nausea, heartburn or change in bowel habits Skin: Denies abnormal  skin rashes Lymphatics: Denies new lymphadenopathy or easy bruising Neurological:Denies numbness, tingling or new weaknesses Behavioral/Psych: Mood is stable, no new changes  All other systems were reviewed with the patient and are negative.  MEDICAL HISTORY:  Past Medical History:  Diagnosis Date   Anxiety    Cataract    bilateral removed   Colonic polyp 2005 & 2013   DDD (degenerative disc disease)    Depression    Diverticulitis large intestine 12/12/2022   GAD (generalized anxiety disorder) 11/11/2023   GERD (gastroesophageal reflux disease)    H/O echocardiogram    before 2000, no need for f/u /w cardiac    Heart murmur    MVP- echo- 2010, no symptoms    MDD (major depressive disorder), recurrent episode, moderate (HCC) 11/11/2023   Memory disorder 09/19/2017   Mononucleosis 1971   Osteopenia after menopause    Ovarian mass, left 12/12/2022   See MRI 12/12/22   Sleep apnea    cpap    SURGICAL HISTORY: Past Surgical History:  Procedure Laterality Date   ANTERIOR CERVICAL DECOMP/DISCECTOMY FUSION N/A 05/20/2013   Procedure: ANTERIOR CERVICAL DECOMPRESSION/DISCECTOMY FUSION 2 LEVELS;  Surgeon: Manya Sells, MD;  Location: [facility];  Service: Neurosurgery;  Laterality: N/A;  Cervical Seven-Thoracic One, Thoracic One-Two Anterior cervical decompression/Diskectomy/Fusion   BACK SURGERY     CARPAL TUNNEL RELEASE Right    CATARACT EXTRACTION, BILATERAL  10/01/2011   Dr Gennie Kicks, Dyana Glade IOL   CERVICAL FUSION  2014   3 proceduresr Nigel Bart, 2007, 20012 and 2014   COLONOSCOPY  2018   SA- TA   colonoscopy with polypectomy  10/01/2011   Dr Arvie Latus   KNEE SURGERY     Dr Emil Harada ; bursa cystectomy   LUMBAR DISC SURGERY  2010   Dr.Stern and 1999   ROBOTIC ASSISTED TOTAL HYSTERECTOMY WITH BILATERAL SALPINGO OOPHERECTOMY Bilateral 01/28/2023   Procedure: XI ROBOTIC ASSISTED TOTAL HYSTERECTOMY WITH BILATERAL SALPINGO OOPHORECTOMY;  Surgeon: Derrel Flies, MD;  Location: [facility];   Service: Gynecology;  Laterality: Bilateral;   ROTATOR CUFF REPAIR     Dr.Wainer   TOTAL HIP ARTHROPLASTY Right 09/04/2021   Procedure: TOTAL HIP ARTHROPLASTY ANTERIOR APPROACH;  Surgeon: Saundra Curl, MD;  Location: [facility];  Service: Orthopedics;  Laterality: Right;   UMBILICAL HERNIA REPAIR  1952   as a baby   WRIST FRACTURE SURGERY Left 02/28/2013   Dr. Sandie Cross    I have reviewed the social history and family history with the patient and they are unchanged from previous note.  ALLERGIES:  is allergic to gabapentin.  MEDICATIONS:  Current Outpatient Medications  Medication Sig Dispense Refill   acetaminophen  (TYLENOL ) 650 MG CR tablet Take 1,300 mg by mouth every 8 (eight) hours as needed for pain.     Bacillus Coagulans-Inulin (ALIGN PREBIOTIC-PROBIOTIC PO) Take by mouth.     Biotin  1000 MCG tablet Take 1,000 mcg by mouth daily.     buPROPion  (WELLBUTRIN  XL) 300 MG 24 hr tablet Take 1 tablet (300 mg total) by mouth daily. 30 tablet 1   busPIRone  (BUSPAR ) 5 MG tablet Take 1 tablet (5 mg total) by mouth 2 (two) times daily. 60 tablet 0   Calcium  Carb-Cholecalciferol  (CALCIUM  600 + D PO) Take 1 tablet by mouth daily.     Cholecalciferol  (VITAMIN D ) 2000 UNITS CAPS Take 2,000 Units by mouth daily.     Cyanocobalamin (B-12) 1000 MCG CAPS  Take 1,000 mcg by mouth daily.     denosumab  (PROLIA ) 60 MG/ML SOSY injection Inject 60 mg into the skin every 6 (six) months. At MD office, last done 03/2022     diclofenac  sodium (VOLTAREN ) 1 % GEL Apply 2 Casey topically 4 (four) times daily. (Patient taking differently: Apply 2 Casey topically 4 (four) times daily as needed (pain).) 3 Tube 3   Multiple Vitamin (MULTIVITAMIN WITH MINERALS) TABS tablet Take 1 tablet by mouth daily.     omeprazole  (PRILOSEC) 40 MG capsule TAKE 1 CAPSULE (40 MG TOTAL) BY MOUTH DAILY. 90 capsule 1   sertraline  (ZOLOFT ) 25 MG tablet Take 1 tablet (25 mg total) by mouth daily. 30 tablet 1   Current Facility-Administered  Medications  Medication Dose Route Frequency Provider Last Rate Last Admin   [START ON 03/12/2024] denosumab  (PROLIA ) injection 60 mg  60 mg Subcutaneous Once Gherghe, Cristina, MD        PHYSICAL EXAMINATION: Not performed   LABORATORY DATA:  I have reviewed the data as listed    Latest Ref Rng & Units 01/29/2024    3:13 PM 11/11/2023    2:23 PM 01/23/2023   11:30 AM  CBC  WBC 4.0 - 10.5 K/uL 14.9  13.8  11.9   Hemoglobin 12.0 - 15.0 Casey/dL 14.7  82.9  56.2   Hematocrit 36.0 - 46.0 % 37.2  36.4  35.6   Platelets 150 - 400 K/uL 250  297  230         Latest Ref Rng & Units 01/29/2024    3:13 PM 11/11/2023    2:23 PM 01/23/2023   11:30 AM  CMP  Glucose 70 - 99 mg/dL 82  89  96   BUN 8 - 23 mg/dL 19  31  19    Creatinine 0.44 - 1.00 mg/dL 1.30  8.65  7.84   Sodium 135 - 145 mmol/L 140  143  140   Potassium 3.5 - 5.1 mmol/L 4.2  4.3  3.9   Chloride 98 - 111 mmol/L 104  105  105   CO2 22 - 32 mmol/L 32  25  28   Calcium  8.9 - 10.3 mg/dL 9.2  9.3  9.1   Total Protein 6.5 - 8.1 Casey/dL 7.1  6.6  6.9   Total Bilirubin 0.0 - 1.2 mg/dL 0.4  0.4  0.5   Alkaline Phos 38 - 126 U/L 70  80  55   AST 15 - 41 U/L 25  19  18    ALT 0 - 44 U/L 19  16  14        RADIOGRAPHIC STUDIES: I have personally reviewed the radiological images as listed and agreed with the findings in the report. No results found.     I discussed the assessment and treatment plan with the patient. The patient was provided an opportunity to ask questions and all were answered. The patient agreed with the plan and demonstrated an understanding of the instructions.   The patient was advised to call back or seek an in-person evaluation if the symptoms worsen or if the condition fails to improve as anticipated.  I provided 25 minutes of non face-to-face telephone visit time during this encounter, including review of chart and various tests results, discussions about plan of care and coordination of care plan.    Sonja [place],  MD 02/10/24

## 2024-02-10 NOTE — Progress Notes (Signed)
 [facility] MD/PA/NP OP Progress Note  02/10/2024 9:33 PM Jessica Casey  MRN:  [id number]  Chief Complaint:  Chief Complaint  Patient presents with   Follow-up    Medication management   HPI: Jessica Casey 73 y.o. female presents office today for medication management follow up.  She is seen face to face by this provider, and chart reviewed on 02/10/24.  Her psychiatric history is significant for major depressive disorder and general anxiety disorder.  Her mental health is currently managed with Zoloft  25 mg daily, Wellbutrin  XL 300 mg daily, and Buspar  5 mg Bid.  She reports that her current medication regimen is managing her mental health well but since her last visit she has had a couple of stressful events. "I'm glad she told me to have my white blood cells checked.  I was diagnosed with CLL (PCP).  She said it was early and I did not need to start treatment right now but I am still anxious and scared.  You know the last time we talked about my daughter was having some problems with pregnancy but we were trying to hold out for 34 weeks, where she gave birth over the weekend to a baby girl at 32 weeks.  But the baby is fine and they have given her some medicine to have her lungs develop and so far things are good.  She states that "We had discussed increasing my BuSpar  to 3 times a day.  I think I'm ready for that."  She also asked for something for breakthrough anxiety.  Discussed starting Vistaril that she can take up to 3 times a day as needed for anxiety.   Today she denies suicidal/self-harm/homicidal ideation, psychosis, paranoia, and abnormal movements.  She states she is eating and sleeping without difficulty.     Recommended the following: Continue Zoloft  to 25 mg daily, Wellbutrin  XL 300 mg daily and increase Buspar  5 mg 3 times daily.  Start Vistaril 10 mg 3 times daily as needed.  She voices understanding with information being given to her today and is agreeable to recommendations.    Visit  Diagnosis:    ICD-10-CM   1. GAD (generalized anxiety disorder)  F41.1 busPIRone  (BUSPAR ) 5 MG tablet    sertraline  (ZOLOFT ) 25 MG tablet    hydrOXYzine (ATARAX) 10 MG tablet    2. MDD (major depressive disorder), recurrent episode, moderate (HCC)  F33.1 sertraline  (ZOLOFT ) 25 MG tablet      Past Psychiatric History: general anxiety, major depression  Past Medical History:  Past Medical History:  Diagnosis Date   Anxiety    Cataract    bilateral removed   Colonic polyp 2005 & 2013   DDD (degenerative disc disease)    Depression    Diverticulitis large intestine 12/12/2022   GAD (generalized anxiety disorder) 11/11/2023   GERD (gastroesophageal reflux disease)    H/O echocardiogram    before 2000, no need for f/u /w cardiac    Heart murmur    MVP- echo- 2010, no symptoms    MDD (major depressive disorder), recurrent episode, moderate (HCC) 11/11/2023   Memory disorder 09/19/2017   Mononucleosis 1971   Osteopenia after menopause    Ovarian mass, left 12/12/2022   See MRI 12/12/22   Sleep apnea    cpap    Past Surgical History:  Procedure Laterality Date   ANTERIOR CERVICAL DECOMP/DISCECTOMY FUSION N/A 05/20/2013   Procedure: ANTERIOR CERVICAL DECOMPRESSION/DISCECTOMY FUSION 2 LEVELS;  Surgeon: Manya Sells, MD;  Location: [facility]  NEURO ORS;  Service: Neurosurgery;  Laterality: N/A;  Cervical Seven-Thoracic One, Thoracic One-Two Anterior cervical decompression/Diskectomy/Fusion   BACK SURGERY     CARPAL TUNNEL RELEASE Right    CATARACT EXTRACTION, BILATERAL  10/01/2011   Dr Gennie Kicks, Dyana Glade IOL   CERVICAL FUSION  2014   3 proceduresr Nigel Bart, 2007, 20012 and 2014   COLONOSCOPY  2018   SA- TA   colonoscopy with polypectomy  10/01/2011   Dr Arvie Latus   KNEE SURGERY     Dr Emil Harada ; bursa cystectomy   LUMBAR DISC SURGERY  2010   Dr.Stern and 1999   ROBOTIC ASSISTED TOTAL HYSTERECTOMY WITH BILATERAL SALPINGO OOPHERECTOMY Bilateral 01/28/2023   Procedure: XI ROBOTIC ASSISTED TOTAL  HYSTERECTOMY WITH BILATERAL SALPINGO OOPHORECTOMY;  Surgeon: Derrel Flies, MD;  Location: [facility];  Service: Gynecology;  Laterality: Bilateral;   ROTATOR CUFF REPAIR     Dr.Wainer   TOTAL HIP ARTHROPLASTY Right 09/04/2021   Procedure: TOTAL HIP ARTHROPLASTY ANTERIOR APPROACH;  Surgeon: Saundra Curl, MD;  Location: [facility];  Service: Orthopedics;  Laterality: Right;   UMBILICAL HERNIA REPAIR  1952   as a baby   WRIST FRACTURE SURGERY Left 02/28/2013   Dr. Sandie Cross    Family Psychiatric History: See below in family history  Family History:  Family History  Problem Relation Age of Onset   Breast cancer Mother 2       estorgen receptor positive   Stroke Mother 15       Dec   Diabetes Father    Coronary artery disease Father        S/P CBAG , no MI -- Dec   Depression Father    Diverticulosis Father    Breast cancer Sister 31       estrogen receptor positive   Depression Sister    Depression Sister    Depression Sister    Depression Sister    Colon polyps Sister    Depression Brother    Colon cancer Maternal Grandfather    Bipolar disorder Other        Neice   Esophageal cancer Neg Hx    Pancreatic cancer Neg Hx    Rectal cancer Neg Hx    Stomach cancer Neg Hx    Ovarian cancer Neg Hx    Endometrial cancer Neg Hx     Social History:  Social History   Socioeconomic History   Marital status: Married    Spouse name: Not on file   Number of children: 2   Years of education: 16+   Highest education level: Master's degree (e.g., MA, MS, MEng, MEd, MSW, MBA)  Occupational History   Occupation: Runner, broadcasting/film/video- Retired  Tobacco Use   Smoking status: Never   Smokeless tobacco: Never  Vaping Use   Vaping status: Never Used  Substance and Sexual Activity   Alcohol use: Never   Drug use: Never   Sexual activity: Not Currently    Partners: Male    Birth control/protection: Post-menopausal  Other Topics Concern   Not on file  Social History Narrative   Lives    Caffeine use:    Right handed    Social Drivers of Health   Financial Resource Strain: Low Risk  (11/10/2022)   Overall Financial Resource Strain (CARDIA)    Difficulty of Paying Living Expenses: Not hard at all  Food Insecurity: No Food Insecurity (12/16/2022)   Hunger Vital Sign    Worried About Running Out of Food in the Last Year:  Never true    Ran Out of Food in the Last Year: Never true  Transportation Needs: No Transportation Needs (12/16/2022)   PRAPARE - Administrator, Civil Service (Medical): No    Lack of Transportation (Non-Medical): No  Physical Activity: Insufficiently Active (11/10/2022)   Exercise Vital Sign    Days of Exercise per Week: 3 days    Minutes of Exercise per Session: 30 min  Stress: Patient Declined (11/10/2022)   [employer] of Occupational Health - Occupational Stress Questionnaire    Feeling of Stress : Patient declined  Social Connections: Socially Integrated (11/10/2022)   Social Connection and Isolation Panel [NHANES]    Frequency of Communication with Friends and Family: More than three times a week    Frequency of Social Gatherings with Friends and Family: Patient declined    Attends Religious Services: More than 4 times per year    Active Member of [employer] or Organizations: Yes    Attends Engineer, structural: More than 4 times per year    Marital Status: Married    Allergies:  Allergies  Allergen Reactions   Gabapentin     Tongue swelling and itching     Metabolic Disorder Labs: Lab Results  Component Value Date   HGBA1C 5.6 11/11/2023   No results found for: "PROLACTIN" Lab Results  Component Value Date   CHOL 194 11/11/2023   TRIG 85 11/11/2023   HDL 63 11/11/2023   CHOLHDL 3.1 11/11/2023   VLDL 40.9 08/08/2015   LDLCALC 116 (H) 11/11/2023   LDLCALC 107 (H) 08/08/2015   Lab Results  Component Value Date   TSH 2.940 11/11/2023   TSH 4.070 10/05/2021   Current Medications: Current Outpatient  Medications  Medication Sig Dispense Refill   acetaminophen  (TYLENOL ) 650 MG CR tablet Take 1,300 mg by mouth every 8 (eight) hours as needed for pain.     Bacillus Coagulans-Inulin (ALIGN PREBIOTIC-PROBIOTIC PO) Take by mouth.     Biotin  1000 MCG tablet Take 1,000 mcg by mouth daily.     buPROPion  (WELLBUTRIN  XL) 300 MG 24 hr tablet Take 1 tablet (300 mg total) by mouth daily. 30 tablet 1   Calcium  Carb-Cholecalciferol  (CALCIUM  600 + D PO) Take 1 tablet by mouth daily.     Cholecalciferol  (VITAMIN D ) 2000 UNITS CAPS Take 2,000 Units by mouth daily.     Cyanocobalamin (B-12) 1000 MCG CAPS Take 1,000 mcg by mouth daily.     denosumab  (PROLIA ) 60 MG/ML SOSY injection Inject 60 mg into the skin every 6 (six) months. At MD office, last done 03/2022     diclofenac  sodium (VOLTAREN ) 1 % GEL Apply 2 g topically 4 (four) times daily. (Patient taking differently: Apply 2 g topically 4 (four) times daily as needed (pain).) 3 Tube 3   hydrOXYzine (ATARAX) 10 MG tablet Take 1 tablet (10 mg total) by mouth 3 (three) times daily as needed for anxiety. 60 tablet 1   Multiple Vitamin (MULTIVITAMIN WITH MINERALS) TABS tablet Take 1 tablet by mouth daily.     omeprazole  (PRILOSEC) 40 MG capsule TAKE 1 CAPSULE (40 MG TOTAL) BY MOUTH DAILY. 90 capsule 1   busPIRone  (BUSPAR ) 5 MG tablet Take 1 tablet (5 mg total) by mouth 3 (three) times daily. 90 tablet 1   sertraline  (ZOLOFT ) 25 MG tablet Take 1 tablet (25 mg total) by mouth daily. 30 tablet 1   Current Facility-Administered Medications  Medication Dose Route Frequency Provider Last Rate Last Admin   [  START ON 03/12/2024] denosumab  (PROLIA ) injection 60 mg  60 mg Subcutaneous Once Gherghe, Cristina, MD         Musculoskeletal: Strength & Muscle Tone: within normal limits Gait & Station: normal Patient leans: N/A  Psychiatric Specialty Exam: Review of Systems  Constitutional:        No other complaints voiced  Psychiatric/Behavioral:  Positive for  dysphoric mood (Improving). Negative for agitation, hallucinations, self-injury, sleep disturbance and suicidal ideas. The patient is nervous/anxious (Improving.  But reports 2 major stressful events since her last visit.). The patient is not hyperactive.   All other systems reviewed and are negative.   Blood pressure 110/65, pulse 97, height 4\' 11"  (1.499 m), weight 120 lb (54.4 kg), last menstrual period 10/01/1995.Body mass index is 24.24 kg/m.  General Appearance: Casual and Neat  Eye Contact:  Good  Speech:  Clear and Coherent and Normal Rate  Volume:  Normal  Mood:  Euthymic  Affect:  Appropriate and Congruent  Thought Process:  Coherent, Goal Directed, and Descriptions of Associations: Intact  Orientation:  Full (Time, Place, and Person)  Thought Content: WDL and Logical   Suicidal Thoughts:  No  Homicidal Thoughts:  No  Memory:  Immediate;   Good Recent;   Good Remote;   Good  Judgement:  Intact  Insight:  Present  Psychomotor Activity:  Normal  Concentration:  Concentration: Good and Attention Span: Good  Recall:  Good  Fund of Knowledge: Good  Language: Good  Akathisia:  No  Handed:  Right  AIMS (if indicated): done  Assets:  Communication Skills Desire for Improvement Financial Resources/Insurance Housing Leisure Time Physical Health Resilience Social Support Transportation  ADL's:  Intact  Cognition: WNL  Sleep:  Good   Screenings: GAD-7    Flowsheet Row Office Visit from 11/11/2023 in [facility] Office Visit from 12/20/2022 in [facility] [place] Office Visit from 11/11/2022 in [facility] [place]  Total GAD-7 Score 21 9 4       Mini-Mental    Flowsheet Row Office Visit from 09/19/2017 in [facility]  Total Score (max 30 points ) 30      PHQ2-9    Flowsheet Row Office Visit from 11/11/2023 in [facility] Most recent reading at 11/11/2023  1:07 PM Video Visit from 04/24/2023 in [facility] [place] Most recent reading at 04/24/2023 10:51 AM Office Visit from 12/20/2022 in [facility] [place] Most recent reading at 12/20/2022 12:29 PM Office Visit from 12/30/2022 in [facility] Of . [facility] Most recent reading at 12/16/2022  8:58 AM Office Visit from 11/11/2022 in [facility] [place] Most recent reading at 11/11/2022 10:59 AM  PHQ-2 Total Score 6 0 0 0 0  PHQ-9 Total Score 21 1 12  -- 5      Flowsheet Row Admission (Discharged) from 01/28/2023 in [facility] Admission (Discharged) from 09/04/2021 in [facility] Pre-Admission Testing 60 from 08/29/2021 in [facility]  C-SSRS RISK CATEGORY No Risk No Risk No Risk      Assessment and Plan: Assessment: Patient seen and examined as noted above. Summary: Today Jessica Casey appears to be doing well.  She reports medications are effectively managing her mental health without any adverse reaction but has had 2  stressful events occur since her last follow-up visit.  She is requesting for the BuSpar  to be increased to 3 times daily and also something else to be prescribed to her for breakthrough anxiety.  Discussed adding Vistaril 3 times daily as needed.  She denies suicidal/self-harm/homicidal ideation, psychosis, paranoia, fluctuations in mood, and abnormal movements.    During visit she is dressed appropriate for age and weather.  She is seated comfortably in chair with no noted distress.  She is alert/oriented x 4, calm/cooperative and mood is congruent with affect.  She spoke in a clear tone at moderate volume, and normal pace, with good eye contact.  Her thought process is coherent, relevant, and there is no indication that she  is currently responding to internal/external stimuli or experiencing delusional thought content.    1. GAD (generalized anxiety disorder) - sertraline  (ZOLOFT ) 25 MG tablet; Take 1 tablet (25 mg total) by mouth daily.  Dispense: 30 tablet; Refill: 1 - busPIRone  (BUSPAR ) 5 MG tablet; Take 1 tablet (5 mg total) by mouth 2 (two) times daily.  Dispense: 60 tablet; Refill: 0  2. MDD (major depressive disorder), recurrent episode, moderate (HCC) - sertraline  (ZOLOFT ) 25 MG tablet; Take 1 tablet (25 mg total) by mouth daily.  Dispense: 30 tablet; Refill: 1 - buPROPion  (WELLBUTRIN  XL) 300 MG 24 hr tablet; Take 1 tablet (300 mg total) by mouth daily.  Dispense: 30 tablet; Refill: 1    Plan: Medications: Meds ordered this encounter  Medications   busPIRone  (BUSPAR ) 5 MG tablet    Sig: Take 1 tablet (5 mg total) by mouth 3 (three) times daily.    Dispense:  90 tablet    Refill:  1    Supervising Provider:   Carlos Chesterfield, SYED T [[id number]]   sertraline  (ZOLOFT ) 25 MG tablet    Sig: Take 1 tablet (25 mg total) by mouth daily.    Dispense:  30 tablet    Refill:  1    Supervising Provider:   ARFEEN, SYED T [[id number]]   hydrOXYzine (ATARAX) 10 MG tablet    Sig: Take 1 tablet (10 mg total) by mouth 3 (three) times daily as needed for anxiety.    Dispense:  60 tablet    Refill:  1    Supervising Provider:   Eduard Grad T [[id number]]    Labs:  Not indicated at this time   Other:  Declines counseling/therapy at this time.   Jessica Casey is instructed to call [phone], mobile crisis, or present to the nearest emergency room should she experience any suicidal/homicidal ideation, auditory/visual/hallucinations, or detrimental worsening of her mental health condition.   Jessica Casey has participated in the development of this treatment plan and verbalized her agreement with plan as listed.  Follow Up: Return in 1 month for medication management follow-up Call in the interim for any side-effects, decompensation,  questions, or problems  Collaboration of Care: Collaboration of Care: Medication Management AEB Medication assessment, adjustment and refills  Patient/Guardian was advised Release of Information must be obtained prior to any record release in order to collaborate their care with an outside provider. Patient/Guardian was advised if they have not already done so to contact the registration department to sign all necessary forms in order for us  to release information regarding their care.   Consent: Patient/Guardian gives verbal consent for treatment and assignment of benefits for services provided during this visit. Patient/Guardian expressed understanding and agreed to proceed.    Justis Dupas, NP 02/10/2024, 9:33  PM

## 2024-02-12 ENCOUNTER — Telehealth: Admitting: Hematology

## 2024-03-02 ENCOUNTER — Encounter (HOSPITAL_COMMUNITY): Payer: Self-pay | Admitting: Registered Nurse

## 2024-03-02 ENCOUNTER — Ambulatory Visit (INDEPENDENT_AMBULATORY_CARE_PROVIDER_SITE_OTHER): Admitting: Registered Nurse

## 2024-03-02 DIAGNOSIS — F411 Generalized anxiety disorder: Secondary | ICD-10-CM | POA: Diagnosis not present

## 2024-03-02 DIAGNOSIS — F331 Major depressive disorder, recurrent, moderate: Secondary | ICD-10-CM | POA: Diagnosis not present

## 2024-03-02 MED ORDER — HYDROXYZINE HCL 10 MG PO TABS: 10.0000 mg | ORAL_TABLET | Freq: Three times a day (TID) | ORAL | 1 refills | Status: DC | PRN

## 2024-03-02 MED ORDER — BUPROPION HCL ER (XL) 300 MG PO TB24: 300.0000 mg | ORAL_TABLET | Freq: Every day | ORAL | 1 refills | Status: DC

## 2024-03-02 MED ORDER — SERTRALINE HCL 25 MG PO TABS: 25.0000 mg | ORAL_TABLET | Freq: Every day | ORAL | 1 refills | Status: DC

## 2024-03-02 MED ORDER — BUSPIRONE HCL 7.5 MG PO TABS: 7.5000 mg | ORAL_TABLET | Freq: Two times a day (BID) | ORAL | 1 refills | Status: DC

## 2024-03-02 NOTE — Progress Notes (Signed)
 [facility] MD/PA/NP OP Progress Note  03/02/2024 2:50 PM Jessica Casey  MRN:  [id number]  Chief Complaint:  Chief Complaint  Patient presents with   Follow-up    Medication management   HPI: Jessica Casey 73 y.o. female presents office today for medication management follow up.  She is seen face to face by this provider, and chart reviewed on 03/02/24.  Her psychiatric history is significant for major depressive disorder and general anxiety disorder.  Her mental health is currently managed with Zoloft  25 mg daily, Wellbutrin  XL 300 mg daily, and Buspar  5 mg Bid.  She reports that her current medication regimen is managing her mental health well without adverse reaction.  Reports there has only been 1 problem that is caused an increase in her anxiety and that is the diagnosis of CLL.  Reports the doctors told her that it is slow-growing but have to wait until next blood test can be done in August to determine if it is slow/fast.  She states she has told her immediate family (husband, daughter, and son) but is unsure if she should tell her extended family, friends, and church.  She denies suicidal ideation/self-harm/homicidal ideation, psychosis, paranoia, and abnormal movements.  Reports she is sleeping without any difficulty but there has been an increase in her appetite and feels that it is more related to comfort eating.  Denies any weight gain at this time.  Recommended the following: Continue Zoloft  to 25 mg daily, Wellbutrin  XL 300 mg daily and increase Buspar  7.5 mg twice daily.  Continue Vistaril  10 mg 3 times daily as needed.  She voices understanding with information being given to her today and is agreeable to recommendations.    Visit Diagnosis:    ICD-10-CM   1. GAD (generalized anxiety disorder)  F41.1 busPIRone  (BUSPAR ) 7.5 MG tablet    hydrOXYzine  (ATARAX ) 10 MG tablet    sertraline  (ZOLOFT ) 25 MG tablet    2. MDD (major depressive disorder), recurrent episode, moderate (HCC)  F33.1  sertraline  (ZOLOFT ) 25 MG tablet    buPROPion  (WELLBUTRIN  XL) 300 MG 24 hr tablet      Past Psychiatric History: general anxiety, major depression  Past Medical History:  Past Medical History:  Diagnosis Date   Anxiety    Cataract    bilateral removed   Colonic polyp 2005 & 2013   DDD (degenerative disc disease)    Depression    Diverticulitis large intestine 12/12/2022   GAD (generalized anxiety disorder) 11/11/2023   GERD (gastroesophageal reflux disease)    H/O echocardiogram    before 2000, no need for f/u /w cardiac    Heart murmur    MVP- echo- 2010, no symptoms    MDD (major depressive disorder), recurrent episode, moderate (HCC) 11/11/2023   Memory disorder 09/19/2017   Mononucleosis 1971   Osteopenia after menopause    Ovarian mass, left 12/12/2022   See MRI 12/12/22   Sleep apnea    cpap    Past Surgical History:  Procedure Laterality Date   ANTERIOR CERVICAL DECOMP/DISCECTOMY FUSION N/A 05/20/2013   Procedure: ANTERIOR CERVICAL DECOMPRESSION/DISCECTOMY FUSION 2 LEVELS;  Surgeon: Manya Sells, MD;  Location: [facility];  Service: Neurosurgery;  Laterality: N/A;  Cervical Seven-Thoracic One, Thoracic One-Two Anterior cervical decompression/Diskectomy/Fusion   BACK SURGERY     CARPAL TUNNEL RELEASE Right    CATARACT EXTRACTION, BILATERAL  10/01/2011   Dr Gennie Kicks, Dyana Glade IOL   CERVICAL FUSION  2014   3 proceduresr Nigel Bart, 2007, 20012 and  2014   COLONOSCOPY  2018   SA- TA   colonoscopy with polypectomy  10/01/2011   Dr Arvie Latus   KNEE SURGERY     Dr Emil Harada ; bursa cystectomy   LUMBAR DISC SURGERY  2010   Dr.Stern and 1999   ROBOTIC ASSISTED TOTAL HYSTERECTOMY WITH BILATERAL SALPINGO OOPHERECTOMY Bilateral 01/28/2023   Procedure: XI ROBOTIC ASSISTED TOTAL HYSTERECTOMY WITH BILATERAL SALPINGO OOPHORECTOMY;  Surgeon: Derrel Flies, MD;  Location: [facility];  Service: Gynecology;  Laterality: Bilateral;   ROTATOR CUFF REPAIR     Dr.Wainer   TOTAL HIP ARTHROPLASTY  Right 09/04/2021   Procedure: TOTAL HIP ARTHROPLASTY ANTERIOR APPROACH;  Surgeon: Saundra Curl, MD;  Location: [facility];  Service: Orthopedics;  Laterality: Right;   UMBILICAL HERNIA REPAIR  1952   as a baby   WRIST FRACTURE SURGERY Left 02/28/2013   Dr. Sandie Cross    Family Psychiatric History: See below in family history  Family History:  Family History  Problem Relation Age of Onset   Breast cancer Mother 66       estorgen receptor positive   Stroke Mother 87       Dec   Diabetes Father    Coronary artery disease Father        S/P CBAG , no MI -- Dec   Depression Father    Diverticulosis Father    Breast cancer Sister 24       estrogen receptor positive   Depression Sister    Depression Sister    Depression Sister    Depression Sister    Colon polyps Sister    Depression Brother    Colon cancer Maternal Grandfather    Bipolar disorder Other        Neice   Esophageal cancer Neg Hx    Pancreatic cancer Neg Hx    Rectal cancer Neg Hx    Stomach cancer Neg Hx    Ovarian cancer Neg Hx    Endometrial cancer Neg Hx     Social History:  Social History   Socioeconomic History   Marital status: Married    Spouse name: Not on file   Number of children: 2   Years of education: 16+   Highest education level: Master's degree (e.g., MA, MS, MEng, MEd, MSW, MBA)  Occupational History   Occupation: Runner, broadcasting/film/video- Retired  Tobacco Use   Smoking status: Never   Smokeless tobacco: Never  Vaping Use   Vaping status: Never Used  Substance and Sexual Activity   Alcohol use: Never   Drug use: Never   Sexual activity: Not Currently    Partners: Male    Birth control/protection: Post-menopausal  Other Topics Concern   Not on file  Social History Narrative   Lives   Caffeine use:    Right handed    Social Drivers of Health   Financial Resource Strain: Low Risk  (11/10/2022)   Overall Financial Resource Strain (CARDIA)    Difficulty of Paying Living Expenses: Not hard  at all  Food Insecurity: No Food Insecurity (12/16/2022)   Hunger Vital Sign    Worried About Running Out of Food in the Last Year: Never true    Ran Out of Food in the Last Year: Never true  Transportation Needs: No Transportation Needs (12/16/2022)   PRAPARE - Administrator, Civil Service (Medical): No    Lack of Transportation (Non-Medical): No  Physical Activity: Insufficiently Active (11/10/2022)   Exercise Vital Sign  Days of Exercise per Week: 3 days    Minutes of Exercise per Session: 30 min  Stress: Patient Declined (11/10/2022)   [employer] of Occupational Health - Occupational Stress Questionnaire    Feeling of Stress : Patient declined  Social Connections: Socially Integrated (11/10/2022)   Social Connection and Isolation Panel [NHANES]    Frequency of Communication with Friends and Family: More than three times a week    Frequency of Social Gatherings with Friends and Family: Patient declined    Attends Religious Services: More than 4 times per year    Active Member of [employer] or Organizations: Yes    Attends Engineer, structural: More than 4 times per year    Marital Status: Married    Allergies:  Allergies  Allergen Reactions   Gabapentin     Tongue swelling and itching     Metabolic Disorder Labs: Lab Results  Component Value Date   HGBA1C 5.6 11/11/2023   No results found for: "PROLACTIN" Lab Results  Component Value Date   CHOL 194 11/11/2023   TRIG 85 11/11/2023   HDL 63 11/11/2023   CHOLHDL 3.1 11/11/2023   VLDL 16.1 08/08/2015   LDLCALC 116 (H) 11/11/2023   LDLCALC 107 (H) 08/08/2015   Lab Results  Component Value Date   TSH 2.940 11/11/2023   TSH 4.070 10/05/2021   Current Medications: Current Outpatient Medications  Medication Sig Dispense Refill   acetaminophen  (TYLENOL ) 650 MG CR tablet Take 1,300 mg by mouth every 8 (eight) hours as needed for pain.     Bacillus Coagulans-Inulin (ALIGN PREBIOTIC-PROBIOTIC PO)  Take by mouth.     Biotin  1000 MCG tablet Take 1,000 mcg by mouth daily.     Calcium  Carb-Cholecalciferol  (CALCIUM  600 + D PO) Take 1 tablet by mouth daily.     Cholecalciferol  (VITAMIN D ) 2000 UNITS CAPS Take 2,000 Units by mouth daily.     Cyanocobalamin (B-12) 1000 MCG CAPS Take 1,000 mcg by mouth daily.     denosumab  (PROLIA ) 60 MG/ML SOSY injection Inject 60 mg into the skin every 6 (six) months. At MD office, last done 03/2022     diclofenac  sodium (VOLTAREN ) 1 % GEL Apply 2 g topically 4 (four) times daily. (Patient taking differently: Apply 2 g topically 4 (four) times daily as needed (pain).) 3 Tube 3   Multiple Vitamin (MULTIVITAMIN WITH MINERALS) TABS tablet Take 1 tablet by mouth daily.     omeprazole  (PRILOSEC) 40 MG capsule TAKE 1 CAPSULE (40 MG TOTAL) BY MOUTH DAILY. 90 capsule 1   buPROPion  (WELLBUTRIN  XL) 300 MG 24 hr tablet Take 1 tablet (300 mg total) by mouth daily. 30 tablet 1   busPIRone  (BUSPAR ) 7.5 MG tablet Take 1 tablet (7.5 mg total) by mouth 2 (two) times daily. 60 tablet 1   hydrOXYzine  (ATARAX ) 10 MG tablet Take 1 tablet (10 mg total) by mouth 3 (three) times daily as needed for anxiety. 60 tablet 1   sertraline  (ZOLOFT ) 25 MG tablet Take 1 tablet (25 mg total) by mouth daily. 30 tablet 1   Current Facility-Administered Medications  Medication Dose Route Frequency Provider Last Rate Last Admin   [START ON 03/12/2024] denosumab  (PROLIA ) injection 60 mg  60 mg Subcutaneous Once Gherghe, Cristina, MD         Musculoskeletal: Strength & Muscle Tone: within normal limits Gait & Station: normal Patient leans: N/A  Psychiatric Specialty Exam: Review of Systems  Constitutional:        No  other complaints voiced  Psychiatric/Behavioral:  Positive for dysphoric mood. Negative for agitation, hallucinations, self-injury, sleep disturbance and suicidal ideas. The patient is nervous/anxious (Continues to stress over diagnosis of CLL). The patient is not hyperactive.   All  other systems reviewed and are negative.   Blood pressure 113/76, pulse (!) 101, height 4\' 10"  (1.473 m), weight 123 lb (55.8 kg), last menstrual period 10/01/1995.Body mass index is 25.71 kg/m.  General Appearance: Casual and Neat  Eye Contact:  Good  Speech:  Clear and Coherent and Normal Rate  Volume:  Normal  Mood:  Anxious  Affect:  Appropriate and Congruent  Thought Process:  Coherent, Goal Directed, and Descriptions of Associations: Intact  Orientation:  Full (Time, Place, and Person)  Thought Content: Logical   Suicidal Thoughts:  No  Homicidal Thoughts:  No  Memory:  Immediate;   Good Recent;   Good Remote;   Good  Judgement:  Intact  Insight:  Present  Psychomotor Activity:  Normal  Concentration:  Concentration: Good and Attention Span: Good  Recall:  Good  Fund of Knowledge: Good  Language: Good  Akathisia:  No  Handed:  Right  AIMS (if indicated): Not done  Assets:  Communication Skills Desire for Improvement Financial Resources/Insurance Housing Leisure Time Physical Health Resilience Social Support Transportation  ADL's:  Intact  Cognition: WNL  Sleep:  Good   Screenings: GAD-7    Flowsheet Row Office Visit from 11/11/2023 in [facility] Office Visit from 12/20/2022 in [facility] [place] Office Visit from 11/11/2022 in [facility] [place]  Total GAD-7 Score 21 9 4       Mini-Mental    Flowsheet Row Office Visit from 09/19/2017 in [facility]  Total Score (max 30 points ) 30      PHQ2-9    Flowsheet Row Office Visit from 11/11/2023 in [facility] Most recent reading at 11/11/2023  1:07 PM Video Visit from 04/24/2023 in [facility] [place] Most recent reading at 04/24/2023 10:51 AM Office Visit from 12/20/2022 in [facility] [place] Most recent reading at 12/20/2022 12:29 PM Office Visit from 12/30/2022 in [facility] [place] Most recent reading at 12/16/2022  8:58 AM Office Visit from 11/11/2022 in [facility] [place] Most recent reading at 11/11/2022 10:59 AM  PHQ-2 Total Score 6 0 0 0 0  PHQ-9 Total Score 21 1 12  -- 5      Flowsheet Row Admission (Discharged) from 01/28/2023 in [place] [facility] Admission (Discharged) from 09/04/2021 in [place] [facility] Pre-Admission Testing 60 from 08/29/2021 in [facility]  C-SSRS RISK CATEGORY No Risk No Risk No Risk      Assessment and Plan: Assessment: Patient seen and examined as noted above. Summary: Today Jessica Casey appears to be doing fairly well.  She reports medications are effectively managing her mental health without any adverse reaction but continues to be stressed over the diagnosis of CLL.  Denies suicidal/self-harm/homicidal ideation, psychosis, paranoia, and abnormal movements. During visit she is dressed appropriate for age and weather.  She is seated comfortably in chair with no noted distress.  She is alert/oriented x 4, calm/cooperative and mood is congruent with affect.  She spoke in a clear tone at moderate volume, and normal  pace, with good eye contact.  Her thought process is coherent, relevant, and there is no indication that she is currently responding to internal/external stimuli or experiencing delusional thought content.    1. GAD (generalized anxiety disorder) - busPIRone  (BUSPAR ) 7.5 MG tablet; Take 1 tablet (7.5 mg total) by mouth 2 (two) times daily.  Dispense: 60 tablet; Refill: 1 - hydrOXYzine  (ATARAX ) 10 MG tablet; Take 1 tablet (10 mg total) by mouth 3 (three) times daily as needed for anxiety.  Dispense: 60 tablet; Refill: 1 - sertraline  (ZOLOFT ) 25 MG tablet; Take 1 tablet (25 mg total)  by mouth daily.  Dispense: 30 tablet; Refill: 1  2. MDD (major depressive disorder), recurrent episode, moderate (HCC) - sertraline  (ZOLOFT ) 25 MG tablet; Take 1 tablet (25 mg total) by mouth daily.  Dispense: 30 tablet; Refill: 1 - buPROPion  (WELLBUTRIN  XL) 300 MG 24 hr tablet; Take 1 tablet (300 mg total) by mouth daily.  Dispense: 30 tablet; Refill: 1   Plan: Medications: Meds ordered this encounter  Medications   busPIRone  (BUSPAR ) 7.5 MG tablet    Sig: Take 1 tablet (7.5 mg total) by mouth 2 (two) times daily.    Dispense:  60 tablet    Refill:  1    Supervising Provider:   ARFEEN, SYED T [[id number]]   hydrOXYzine  (ATARAX ) 10 MG tablet    Sig: Take 1 tablet (10 mg total) by mouth 3 (three) times daily as needed for anxiety.    Dispense:  60 tablet    Refill:  1    Supervising Provider:   ARFEEN, SYED T [[id number]]   sertraline  (ZOLOFT ) 25 MG tablet    Sig: Take 1 tablet (25 mg total) by mouth daily.    Dispense:  30 tablet    Refill:  1    Supervising Provider:   ARFEEN, SYED T [[id number]]   buPROPion  (WELLBUTRIN  XL) 300 MG 24 hr tablet    Sig: Take 1 tablet (300 mg total) by mouth daily.    Dispense:  30 tablet    Refill:  1    Supervising Provider:   Eduard Grad T [[id number]]    Labs:  Not indicated at this time   Other:  Declines counseling/therapy at this time.   Jessica Casey is instructed to call [phone], mobile crisis, or present to the nearest emergency room should she experience any suicidal/homicidal ideation, auditory/visual/hallucinations, or detrimental worsening of her mental health condition.   Jessica Casey participated in the development of this treatment plan and verbalized her understanding/agreement with plan as listed.  Follow Up: Return in 1 month for medication management follow-up Call in the interim for any side-effects, decompensation, questions, or problems  Collaboration of Care: Collaboration of Care: Medication Management AEB Medication assessment,  adjustment and refills  Patient/Guardian was advised Release of Information must be obtained prior to any record release in order to collaborate their care with an outside provider. Patient/Guardian was advised if they have not already done so to contact the registration department to sign all necessary forms in order for us  to release information regarding their care.   Consent: Patient/Guardian gives verbal consent for treatment and assignment of benefits for services provided during this visit. Patient/Guardian expressed understanding and agreed to proceed.    Jaqualin Serpa, NP 03/02/2024, 2:50 PM

## 2024-03-02 NOTE — Patient Instructions (Signed)

## 2024-03-22 DIAGNOSIS — M25511 Pain in right shoulder: Secondary | ICD-10-CM | POA: Diagnosis not present

## 2024-03-23 ENCOUNTER — Ambulatory Visit: Admitting: Family Medicine

## 2024-03-23 VITALS — BP 138/78 | HR 100 | Temp 98.7°F | Resp 16 | Ht <= 58 in | Wt 123.1 lb

## 2024-03-23 DIAGNOSIS — H6993 Unspecified Eustachian tube disorder, bilateral: Secondary | ICD-10-CM | POA: Diagnosis not present

## 2024-03-23 DIAGNOSIS — H6093 Unspecified otitis externa, bilateral: Secondary | ICD-10-CM

## 2024-03-23 MED ORDER — CIPROFLOXACIN-DEXAMETHASONE 0.3-0.1 % OT SUSP: 4.0000 [drp] | Freq: Two times a day (BID) | OTIC | 0 refills | Status: AC

## 2024-03-23 NOTE — Telephone Encounter (Signed)
 Prolia  VOB initiated via [email or web address]  Next Prolia  inj DUE: 04/12/24

## 2024-03-23 NOTE — Patient Instructions (Addendum)
 A few things to remember from today's visit:  Otitis externa of both ears, unspecified chronicity, unspecified type - Plan: ciprofloxacin -dexamethasone  (CIPRODEX) OTIC suspension  Dysfunction of both eustachian tubes Try ear drops. Continue popping your ears a few times during the day. If persistent ENT evaluation will be the next step.  Do not use My Chart to request refills or for acute issues that need immediate attention. If you send a my chart message, it may take a few days to be addressed, specially if I am not in the office.  Please be sure medication list is accurate. If a new problem present, please set up appointment sooner than planned today.

## 2024-03-23 NOTE — Progress Notes (Signed)
 ACUTE VISIT Chief Complaint  Patient presents with   ears draining    X 3-4 days    HPI: Jessica Casey is a 73 y.o. female with PMHx significant for sensorineural hearing loss, OSA, CKD 3, MDD, and GAD here today complaining of ear fullness and bilateral ear drainage, ongoing 3-4 days.  Constant bilateral ear fullness sensation. She described her ear drainage as clear-yellow in color and reports it tends to crust on the inside of her ear.  Problem has improved.  She also endorses post-nasal drainage. She did recently travel on a plane on 6/12. No recent URI. States that she is able to pop left ear but no right ear.  Of note, she mentions that she wears hearing aids due to her 27% voice recognition function in her left ear and 35% on the right.  She has been wearing her hearing aids since 2016  Negative for ear pain, hearing changes, fever, cough, or ear trauma.  She follows up with her psychiatrist regularly.   Since her last visit she has seen her oncologist, diagnosed with CLL. Currently she is not on treatment but following closely with Dr. Odetta.     Latest Ref Rng & Units 01/29/2024    3:13 PM 11/11/2023    2:23 PM 01/23/2023   11:30 AM  CBC  WBC 4.0 - 10.5 K/uL 14.9  13.8  11.9   Hemoglobin 12.0 - 15.0 g/dL 87.6  87.9  88.6   Hematocrit 36.0 - 46.0 % 37.2  36.4  35.6   Platelets 150 - 400 K/uL 250  297  230    Review of Systems  Constitutional:  Negative for activity change and appetite change.  HENT:  Negative for congestion, facial swelling, mouth sores, rhinorrhea and sore throat.   Respiratory:  Negative for shortness of breath and wheezing.   Gastrointestinal:  Negative for abdominal pain and nausea.  Skin:  Negative for rash.  Allergic/Immunologic: Negative for environmental allergies.  Neurological:  Negative for dizziness, syncope, weakness and headaches.  See other pertinent positives and negatives in HPI.  Current Outpatient Medications on File Prior  to Visit  Medication Sig Dispense Refill   acetaminophen  (TYLENOL ) 650 MG CR tablet Take 1,300 mg by mouth every 8 (eight) hours as needed for pain.     Bacillus Coagulans-Inulin (ALIGN PREBIOTIC-PROBIOTIC PO) Take by mouth.     Biotin  1000 MCG tablet Take 1,000 mcg by mouth daily.     buPROPion  (WELLBUTRIN  XL) 300 MG 24 hr tablet Take 1 tablet (300 mg total) by mouth daily. 30 tablet 1   busPIRone  (BUSPAR ) 7.5 MG tablet Take 1 tablet (7.5 mg total) by mouth 2 (two) times daily. 60 tablet 1   Calcium  Carb-Cholecalciferol  (CALCIUM  600 + D PO) Take 1 tablet by mouth daily.     Cholecalciferol  (VITAMIN D ) 2000 UNITS CAPS Take 2,000 Units by mouth daily.     Cyanocobalamin (B-12) 1000 MCG CAPS Take 1,000 mcg by mouth daily.     denosumab  (PROLIA ) 60 MG/ML SOSY injection Inject 60 mg into the skin every 6 (six) months. At MD office, last done 03/2022     diclofenac  sodium (VOLTAREN ) 1 % GEL Apply 2 g topically 4 (four) times daily. (Patient taking differently: Apply 2 g topically 4 (four) times daily as needed (pain).) 3 Tube 3   hydrOXYzine  (ATARAX ) 10 MG tablet Take 1 tablet (10 mg total) by mouth 3 (three) times daily as needed for anxiety. 60 tablet 1  Multiple Vitamin (MULTIVITAMIN WITH MINERALS) TABS tablet Take 1 tablet by mouth daily.     omeprazole  (PRILOSEC) 40 MG capsule TAKE 1 CAPSULE (40 MG TOTAL) BY MOUTH DAILY. 90 capsule 1   sertraline  (ZOLOFT ) 25 MG tablet Take 1 tablet (25 mg total) by mouth daily. 30 tablet 1   Current Facility-Administered Medications on File Prior to Visit  Medication Dose Route Frequency Provider Last Rate Last Admin   denosumab  (PROLIA ) injection 60 mg  60 mg Subcutaneous Once Trixie File, MD       Past Medical History:  Diagnosis Date   Anxiety    Cataract    bilateral removed   Colonic polyp 2005 & 2013   DDD (degenerative disc disease)    Depression    Diverticulitis large intestine 12/12/2022   GAD (generalized anxiety disorder) 11/11/2023    GERD (gastroesophageal reflux disease)    H/O echocardiogram    before 2000, no need for f/u /w cardiac    Heart murmur    MVP- echo- 2010, no symptoms    MDD (major depressive disorder), recurrent episode, moderate (HCC) 11/11/2023   Memory disorder 09/19/2017   Mononucleosis 1971   Osteopenia after menopause    Ovarian mass, left 12/12/2022   See MRI 12/12/22   Sleep apnea    cpap   Allergies  Allergen Reactions   Gabapentin     Tongue swelling and itching     Social History   Socioeconomic History   Marital status: Married    Spouse name: Not on file   Number of children: 2   Years of education: 16+   Highest education level: Master's degree (e.g., MA, MS, MEng, MEd, MSW, MBA)  Occupational History   Occupation: Runner, broadcasting/film/video- Retired  Tobacco Use   Smoking status: Never   Smokeless tobacco: Never  Vaping Use   Vaping status: Never Used  Substance and Sexual Activity   Alcohol use: Never   Drug use: Never   Sexual activity: Not Currently    Partners: Male    Birth control/protection: Post-menopausal  Other Topics Concern   Not on file  Social History Narrative   Lives   Caffeine use:    Right handed    Social Drivers of Health   Financial Resource Strain: Low Risk  (03/23/2024)   Overall Financial Resource Strain (CARDIA)    Difficulty of Paying Living Expenses: Not hard at all  Food Insecurity: No Food Insecurity (03/23/2024)   Hunger Vital Sign    Worried About Running Out of Food in the Last Year: Never true    Ran Out of Food in the Last Year: Never true  Transportation Needs: No Transportation Needs (03/23/2024)   PRAPARE - Administrator, Civil Service (Medical): No    Lack of Transportation (Non-Medical): No  Physical Activity: Insufficiently Active (03/23/2024)   Exercise Vital Sign    Days of Exercise per Week: 2 days    Minutes of Exercise per Session: 20 min  Stress: Stress Concern Present (03/23/2024)   [employer] of Occupational  Health - Occupational Stress Questionnaire    Feeling of Stress: Very much  Social Connections: Socially Integrated (03/23/2024)   Social Connection and Isolation Panel    Frequency of Communication with Friends and Family: More than three times a week    Frequency of Social Gatherings with Friends and Family: More than three times a week    Attends Religious Services: More than 4 times per year    Active  Member of Clubs or Organizations: Yes    Attends Banker Meetings: More than 4 times per year    Marital Status: Married   Vitals:   03/23/24 1414  BP: 138/78  Pulse: 100  Resp: 16  Temp: 98.7 F (37.1 C)  SpO2: 96%   Body mass index is 25.73 kg/m.  Physical Exam Vitals and nursing note reviewed.  Constitutional:      General: She is not in acute distress.    Appearance: She is well-developed.  HENT:     Head: Normocephalic and atraumatic.     Comments:       Right Ear: External ear normal. A middle ear effusion is present. Tympanic membrane is bulging. Tympanic membrane is not erythematous.     Left Ear: External ear normal. A middle ear effusion is present. Tympanic membrane is bulging. Tympanic membrane is not erythematous.     Ears:     Comments: Ear canals with whitish crusty area, R>L, no erythema or pain with otoscopic exam or when pressing tragus.  Eyes:     Conjunctiva/sclera: Conjunctivae normal.   Pulmonary:     Effort: Pulmonary effort is normal. No respiratory distress.     Breath sounds: Normal breath sounds.  Lymphadenopathy:     Cervical: No cervical adenopathy.   Skin:    General: Skin is warm.     Findings: No erythema or rash.   Neurological:     General: No focal deficit present.     Mental Status: She is alert and oriented to person, place, and time.     Comments: Stable gait, not assisted.  Psychiatric:        Mood and Affect: Mood and affect normal.    ASSESSMENT AND PLAN: Jessica Casey was seen today for ear  fullness and drainage.  Otitis externa of both ears, unspecified chronicity, unspecified type No significant findings upon examination of the ear canal, some crusty areas but no erythema or edema.  We discussed differential diagnosis, could have an acute external otitis that has resolved, irritation of ear canal from hearing aids is also a possibility, she has not been wearing hearing aids for the past 1 to 2 days. She would like topical treatment to treat possible infectious process, recommend Ciprodex in both ears for 7 days. If pruritus she can use OTC hydrocortisone 1%, small amount daily as needed. Monitor for new symptoms. Follow-up as needed.  -     Ciprofloxacin -dexAMETHasone ; Place 4 drops into both ears 2 (two) times daily for 7 days.  Dispense: 7.5 mL; Refill: 0  Dysfunction of both eustachian tubes We discussed diagnosis, prognosis, and treatment options. I do not recommend OTC decongestants. Continue auto inflation maneuvers a few times throughout the day. She has seen ENT in the past for this problem, last visit in 12/2020. If problem does not resolve in the next couple months, ENT evaluation can be considered. Monitor for new symptoms.  I spent a total of 30 minutes in both face to face and non face to face activities for this visit on the date of this encounter. During this time history was obtained and documented, examination was performed, prior labs reviewed, and assessment/plan discussed.  Return if symptoms worsen or fail to improve.  I, Vernell Forest, acting as a scribe for Annette Liotta [place], MD., have documented all relevant documentation on the behalf of Jhase Creppel [place], MD, as directed by   while in the presence of Naquita Nappier [place], MD.  I,  Panagiota Perfetti [place], MD, have reviewed all documentation for this visit. The documentation on 03/23/24 for the exam, diagnosis, procedures, and orders are all accurate and complete.  Patrisia Faeth G. [place], MD  [facility]. [place]  office.

## 2024-03-25 ENCOUNTER — Other Ambulatory Visit (HOSPITAL_COMMUNITY): Payer: Self-pay | Admitting: Registered Nurse

## 2024-03-25 DIAGNOSIS — F411 Generalized anxiety disorder: Secondary | ICD-10-CM

## 2024-03-31 ENCOUNTER — Other Ambulatory Visit: Payer: Self-pay | Admitting: Family Medicine

## 2024-03-31 DIAGNOSIS — K219 Gastro-esophageal reflux disease without esophagitis: Secondary | ICD-10-CM

## 2024-04-06 ENCOUNTER — Ambulatory Visit (INDEPENDENT_AMBULATORY_CARE_PROVIDER_SITE_OTHER): Admitting: Registered Nurse

## 2024-04-06 ENCOUNTER — Encounter (HOSPITAL_COMMUNITY): Payer: Self-pay | Admitting: Registered Nurse

## 2024-04-06 VITALS — BP 112/62 | HR 83 | Ht <= 58 in | Wt 122.0 lb

## 2024-04-06 DIAGNOSIS — F331 Major depressive disorder, recurrent, moderate: Secondary | ICD-10-CM | POA: Diagnosis not present

## 2024-04-06 DIAGNOSIS — G4700 Insomnia, unspecified: Secondary | ICD-10-CM

## 2024-04-06 DIAGNOSIS — F411 Generalized anxiety disorder: Secondary | ICD-10-CM

## 2024-04-06 MED ORDER — SERTRALINE HCL 50 MG PO TABS: 50.0000 mg | ORAL_TABLET | Freq: Every day | ORAL | 1 refills | Status: DC

## 2024-04-06 MED ORDER — BUPROPION HCL ER (XL) 300 MG PO TB24: 300.0000 mg | ORAL_TABLET | Freq: Every day | ORAL | 1 refills | Status: AC

## 2024-04-06 MED ORDER — HYDROXYZINE HCL 10 MG PO TABS: ORAL_TABLET | ORAL | 1 refills | Status: AC

## 2024-04-06 MED ORDER — BUSPIRONE HCL 7.5 MG PO TABS: 7.5000 mg | ORAL_TABLET | Freq: Two times a day (BID) | ORAL | 1 refills | Status: AC

## 2024-04-06 NOTE — Progress Notes (Signed)
 [facility] MD/PA/NP OP Progress Note  04/06/2024 2:39 PM Jessica Casey  MRN:  [id number]  Chief Complaint:  Chief Complaint  Patient presents with   Follow-up    Medication management   HPI: Jessica Casey 73 y.o. female presents office today for medication management follow up.  She is seen face to face by this provider, and chart reviewed on 04/06/24.  Her psychiatric history is significant for major depressive disorder and general anxiety disorder.  Her mental health is currently managed with Zoloft  25 mg daily, Wellbutrin  XL 300 mg daily, Buspar  7.5 mg Bid, and Vistaril  10 mg Tid prn.  She reports that her current medication regimen is working but I'm not where I want to be.  Reports there has been improvement with both anxiety and depression but Lately I've been more irritable.  Reports that she and her husband have been on the go, go, go and haven't had time to just rest.  She states that she has also been stress eating and has gained some weight.  Reports that sleep is fair.  Once I go to sleep I'm fie but it takes me a while to fall to sleep.  Today she denies suicidal/self-harm/homicidal ideation, psychosis, and paranoia.  PHQ 2/9, C-SSRS, GAD 7, AIMS, nutrition, and pain assessments conducted during today's visit.  See scores below   Recommended the following:  Increase Zoloft  to 50 mg daily, continue Wellbutrin  XL 300 mg daily and increase Buspar  7.5 mg twice daily.  Increase Vistaril  10 mg to 20 mg 3 times daily as needed for anxiety/sleep.  She voices understanding with information being given to her today and is agreeable to recommendations.  If no improvement with sleep with Vistaril  will add Trazodone at next visit.     Visit Diagnosis:    ICD-10-CM   1. Insomnia, unspecified type  G47.00 hydrOXYzine  (ATARAX ) 10 MG tablet    2. GAD (generalized anxiety disorder)  F41.1 sertraline  (ZOLOFT ) 50 MG tablet    hydrOXYzine  (ATARAX ) 10 MG tablet    busPIRone  (BUSPAR ) 7.5 MG tablet    3.  MDD (major depressive disorder), recurrent episode, moderate (HCC)  F33.1 sertraline  (ZOLOFT ) 50 MG tablet    buPROPion  (WELLBUTRIN  XL) 300 MG 24 hr tablet      Past Psychiatric History: general anxiety, major depression  Past Medical History:  Past Medical History:  Diagnosis Date   Anxiety    Cataract    bilateral removed   Colonic polyp 2005 & 2013   DDD (degenerative disc disease)    Depression    Diverticulitis large intestine 12/12/2022   GAD (generalized anxiety disorder) 11/11/2023   GERD (gastroesophageal reflux disease)    H/O echocardiogram    before 2000, no need for f/u /w cardiac    Heart murmur    MVP- echo- 2010, no symptoms    MDD (major depressive disorder), recurrent episode, moderate (HCC) 11/11/2023   Memory disorder 09/19/2017   Mononucleosis 1971   Osteopenia after menopause    Ovarian mass, left 12/12/2022   See MRI 12/12/22   Sleep apnea    cpap    Past Surgical History:  Procedure Laterality Date   ANTERIOR CERVICAL DECOMP/DISCECTOMY FUSION N/A 05/20/2013   Procedure: ANTERIOR CERVICAL DECOMPRESSION/DISCECTOMY FUSION 2 LEVELS;  Surgeon: Fairy Levels, MD;  Location: [facility];  Service: Neurosurgery;  Laterality: N/A;  Cervical Seven-Thoracic One, Thoracic One-Two Anterior cervical decompression/Diskectomy/Fusion   BACK SURGERY     CARPAL TUNNEL RELEASE Right    CATARACT  EXTRACTION, BILATERAL  10/01/2011   Dr Roz, EFRAIM IOL   CERVICAL FUSION  2014   3 proceduresr Unice RITA OFFICER and 2014   COLONOSCOPY  2018   SA- TA   colonoscopy with polypectomy  10/01/2011   Dr Debrah   KNEE SURGERY     Dr Willy ; bursa cystectomy   LUMBAR DISC SURGERY  2010   Dr.Stern and 1999   ROBOTIC ASSISTED TOTAL HYSTERECTOMY WITH BILATERAL SALPINGO OOPHERECTOMY Bilateral 01/28/2023   Procedure: XI ROBOTIC ASSISTED TOTAL HYSTERECTOMY WITH BILATERAL SALPINGO OOPHORECTOMY;  Surgeon: Eldonna Mays, MD;  Location: [facility];  Service: Gynecology;  Laterality:  Bilateral;   ROTATOR CUFF REPAIR     Dr.Wainer   TOTAL HIP ARTHROPLASTY Right 09/04/2021   Procedure: TOTAL HIP ARTHROPLASTY ANTERIOR APPROACH;  Surgeon: Beverley Evalene BIRCH, MD;  Location: [facility];  Service: Orthopedics;  Laterality: Right;   UMBILICAL HERNIA REPAIR  1953   as a baby   WRIST FRACTURE SURGERY Left 02/28/2013   Dr. Toribio Beverley    Family Psychiatric History: See below in family history  Family History:  Family History  Problem Relation Age of Onset   Breast cancer Mother 24       estorgen receptor positive   Stroke Mother 9       Dec   Diabetes Father    Coronary artery disease Father        S/P CBAG , no MI -- Dec   Depression Father    Diverticulosis Father    Breast cancer Sister 49       estrogen receptor positive   Depression Sister    Depression Sister    Depression Sister    Depression Sister    Colon polyps Sister    Depression Brother    Colon cancer Maternal Grandfather    Bipolar disorder Other        Neice   Esophageal cancer Neg Hx    Pancreatic cancer Neg Hx    Rectal cancer Neg Hx    Stomach cancer Neg Hx    Ovarian cancer Neg Hx    Endometrial cancer Neg Hx     Social History:  Social History   Socioeconomic History   Marital status: Married    Spouse name: Not on file   Number of children: 2   Years of education: 16+   Highest education level: Master's degree (e.g., MA, MS, MEng, MEd, MSW, MBA)  Occupational History   Occupation: Runner, broadcasting/film/video- Retired  Tobacco Use   Smoking status: Never   Smokeless tobacco: Never  Vaping Use   Vaping status: Never Used  Substance and Sexual Activity   Alcohol use: Never   Drug use: Never   Sexual activity: Not Currently    Partners: Male    Birth control/protection: Post-menopausal  Other Topics Concern   Not on file  Social History Narrative   Lives   Caffeine use:    Right handed    Social Drivers of Health   Financial Resource Strain: Low Risk  (03/23/2024)   Overall Financial  Resource Strain (CARDIA)    Difficulty of Paying Living Expenses: Not hard at all  Food Insecurity: No Food Insecurity (03/23/2024)   Hunger Vital Sign    Worried About Running Out of Food in the Last Year: Never true    Ran Out of Food in the Last Year: Never true  Transportation Needs: No Transportation Needs (03/23/2024)   PRAPARE - Transportation    Lack of Transportation (  Medical): No    Lack of Transportation (Non-Medical): No  Physical Activity: Insufficiently Active (03/23/2024)   Exercise Vital Sign    Days of Exercise per Week: 2 days    Minutes of Exercise per Session: 20 min  Stress: Stress Concern Present (03/23/2024)   [employer] of Occupational Health - Occupational Stress Questionnaire    Feeling of Stress: Very much  Social Connections: Socially Integrated (03/23/2024)   Social Connection and Isolation Panel    Frequency of Communication with Friends and Family: More than three times a week    Frequency of Social Gatherings with Friends and Family: More than three times a week    Attends Religious Services: More than 4 times per year    Active Member of [employer] or Organizations: Yes    Attends Engineer, structural: More than 4 times per year    Marital Status: Married    Allergies:  Allergies  Allergen Reactions   Gabapentin     Tongue swelling and itching     Metabolic Disorder Labs: Lab Results  Component Value Date   HGBA1C 5.6 11/11/2023   No results found for: PROLACTIN Lab Results  Component Value Date   CHOL 194 11/11/2023   TRIG 85 11/11/2023   HDL 63 11/11/2023   CHOLHDL 3.1 11/11/2023   VLDL 85.5 08/08/2015   LDLCALC 116 (H) 11/11/2023   LDLCALC 107 (H) 08/08/2015   Lab Results  Component Value Date   TSH 2.940 11/11/2023   TSH 4.070 10/05/2021   Current Medications: Current Outpatient Medications  Medication Sig Dispense Refill   acetaminophen  (TYLENOL ) 650 MG CR tablet Take 1,300 mg by mouth every 8 (eight) hours as  needed for pain.     Bacillus Coagulans-Inulin (ALIGN PREBIOTIC-PROBIOTIC PO) Take by mouth.     Biotin  1000 MCG tablet Take 1,000 mcg by mouth daily.     buPROPion  (WELLBUTRIN  XL) 300 MG 24 hr tablet Take 1 tablet (300 mg total) by mouth daily. 30 tablet 1   busPIRone  (BUSPAR ) 7.5 MG tablet Take 1 tablet (7.5 mg total) by mouth 2 (two) times daily. 60 tablet 1   Calcium  Carb-Cholecalciferol  (CALCIUM  600 + D PO) Take 1 tablet by mouth daily.     Cholecalciferol  (VITAMIN D ) 2000 UNITS CAPS Take 2,000 Units by mouth daily.     Cyanocobalamin (B-12) 1000 MCG CAPS Take 1,000 mcg by mouth daily.     denosumab  (PROLIA ) 60 MG/ML SOSY injection Inject 60 mg into the skin every 6 (six) months. At MD office, last done 03/2022     diclofenac  sodium (VOLTAREN ) 1 % GEL Apply 2 g topically 4 (four) times daily. (Patient taking differently: Apply 2 g topically 4 (four) times daily as needed (pain).) 3 Tube 3   hydrOXYzine  (ATARAX ) 10 MG tablet Take 10 mg - 20 mg (1-2 tablets) three times daily as needed for anxiety or sleep 60 tablet 1   Multiple Vitamin (MULTIVITAMIN WITH MINERALS) TABS tablet Take 1 tablet by mouth daily.     omeprazole  (PRILOSEC) 40 MG capsule TAKE 1 CAPSULE (40 MG TOTAL) BY MOUTH DAILY. 90 capsule 1   sertraline  (ZOLOFT ) 50 MG tablet Take 1 tablet (50 mg total) by mouth daily. 30 tablet 1   Current Facility-Administered Medications  Medication Dose Route Frequency Provider Last Rate Last Admin   denosumab  (PROLIA ) injection 60 mg  60 mg Subcutaneous Once Gherghe, Cristina, MD         Musculoskeletal: Strength & Muscle Tone: within normal  limits Gait & Station: normal Patient leans: N/A  Psychiatric Specialty Exam: Review of Systems  Constitutional:        No other complaints voiced  Psychiatric/Behavioral:  Positive for dysphoric mood (Reports worsening of depression). Negative for agitation (worsening irritability for last several weeks), hallucinations, self-injury, sleep  disturbance and suicidal ideas. The patient is nervous/anxious (reports more stable). The patient is not hyperactive.   All other systems reviewed and are negative.   Blood pressure 112/62, pulse 83, height 4' 10 (1.473 m), weight 122 lb (55.3 kg), last menstrual period 10/01/1995.Body mass index is 25.5 kg/m.  General Appearance: Casual and Neat  Eye Contact:  Good  Speech:  Clear and Coherent and Normal Rate  Volume:  Normal  Mood:  Depressed  Affect:  Appropriate and Congruent  Thought Process:  Coherent, Goal Directed, and Descriptions of Associations: Intact  Orientation:  Full (Time, Place, and Person)  Thought Content: Logical   Suicidal Thoughts:  No  Homicidal Thoughts:  No  Memory:  Immediate;   Good Recent;   Good Remote;   Good  Judgement:  Intact  Insight:  Present  Psychomotor Activity:  Normal  Concentration:  Concentration: Good and Attention Span: Good  Recall:  Good  Fund of Knowledge: Good  Language: Good  Akathisia:  No  Handed:  Right  AIMS (if indicated): Not done  Assets:  Communication Skills Desire for Improvement Financial Resources/Insurance Housing Leisure Time Physical Health Resilience Social Support Transportation  ADL's:  Intact  Cognition: WNL  Sleep:  Good, but takes a while to fall to sleep   Screenings: GAD-7    Flowsheet Row Office Visit from 04/06/2024 in [facility] Office Visit from 11/11/2023 in [facility] at [facility] Office Visit from 12/20/2022 in [facility] [place] Office Visit from 11/11/2022 in [facility] [place]  Total GAD-7 Score 8 21 9 4    Mini-Mental    Flowsheet Row Office Visit from 09/19/2017 in [facility]  Total Score (max 30 points ) 30   PHQ2-9    Flowsheet Row Office Visit from 04/06/2024 in [facility]  at [facility] Most recent reading at 04/06/2024  2:20 PM Office Visit from 11/11/2023 in [facility] at [facility] Most recent reading at 11/11/2023  1:07 PM Video Visit from 04/24/2023 in [facility] [place] Most recent reading at 04/24/2023 10:51 AM Office Visit from 12/20/2022 in [facility] [place] Most recent reading at 12/20/2022 12:29 PM Office Visit from 12/30/2022 in [facility] [place] Most recent reading at 12/16/2022  8:58 AM  PHQ-2 Total Score 5 6 0 0 0  PHQ-9 Total Score 21 21 1 12  --   Flowsheet Row Office Visit from 04/06/2024 in [facility] at [facility] Admission (Discharged) from 01/28/2023 in [place] [facility] Admission (Discharged) from 09/04/2021 in [place] [facility]  C-SSRS RISK CATEGORY No Risk No Risk No Risk   Assessment and Plan: Assessment: Patient seen and examined as noted above. Summary: Today BESS SALTZMAN appears to be doing fairly well.  She is reporting worsening in depression with irritability and doesn't  feel that she is where she needs to be mentally.  Reporting difficult time falling to sleep but once sleep she sleeps  through the night.  Also reporting some weight gain related to stress eating.   During visit she is dressed appropriate for age and weather.  She is seated comfortably in chair with no noted distress.  She is alert/oriented x 4, calm/cooperative and mood is congruent with affect.  She spoke in a clear tone at moderate volume, and normal pace, with good eye contact.  Her thought process is coherent, relevant, and there is no indication that she is currently responding to internal/external stimuli or experiencing delusional thought content.    1. GAD (generalized anxiety disorder) - sertraline  (ZOLOFT ) 50 MG tablet; Take 1 tablet (50 mg total) by  mouth daily.  Dispense: 30 tablet; Refill: 1 - hydrOXYzine  (ATARAX ) 10 MG tablet; Take 10 mg - 20 mg (1-2 tablets) three times daily as needed for anxiety or sleep  Dispense: 60 tablet; Refill: 1 - busPIRone  (BUSPAR ) 7.5 MG tablet; Take 1 tablet (7.5 mg total) by mouth 2 (two) times daily.  Dispense: 60 tablet; Refill: 1  2. MDD (major depressive disorder), recurrent episode, moderate (HCC) - sertraline  (ZOLOFT ) 50 MG tablet; Take 1 tablet (50 mg total) by mouth daily.  Dispense: 30 tablet; Refill: 1 - buPROPion  (WELLBUTRIN  XL) 300 MG 24 hr tablet; Take 1 tablet (300 mg total) by mouth daily.  Dispense: 30 tablet; Refill: 1  3. Insomnia, unspecified type (Primary) - hydrOXYzine  (ATARAX ) 10 MG tablet; Take 10 mg - 20 mg (1-2 tablets) three times daily as needed for anxiety or sleep  Dispense: 60 tablet; Refill: 1   Plan: Medications: Meds ordered this encounter  Medications   sertraline  (ZOLOFT ) 50 MG tablet    Sig: Take 1 tablet (50 mg total) by mouth daily.    Dispense:  30 tablet    Refill:  1    Supervising Provider:   CURRY, SYED T [[id number]]   hydrOXYzine  (ATARAX ) 10 MG tablet    Sig: Take 10 mg - 20 mg (1-2 tablets) three times daily as needed for anxiety or sleep    Dispense:  60 tablet    Refill:  1    Supervising Provider:   CURRY PATERSON T [[id number]]   busPIRone  (BUSPAR ) 7.5 MG tablet    Sig: Take 1 tablet (7.5 mg total) by mouth 2 (two) times daily.    Dispense:  60 tablet    Refill:  1    Supervising Provider:   CURRY, SYED T [[id number]]   buPROPion  (WELLBUTRIN  XL) 300 MG 24 hr tablet    Sig: Take 1 tablet (300 mg total) by mouth daily.    Dispense:  30 tablet    Refill:  1    Supervising Provider:   CURRY PATERSON T [[id number]]    Labs:  Not indicated at this time   Other:  Declines counseling/therapy at this time.   DEZIRAY NABI is instructed to call 911, [phone], mobile crisis, or present to the nearest emergency room should she experience any suicidal/homicidal ideation,  auditory/visual/hallucinations, or detrimental worsening of her mental health condition.   Deland CHRISTELLA Potters participated in the development of this treatment plan and verbalized her understanding/agreement with plan as listed.  Follow Up: Return in 2 weeks for medication management follow-up Call in the interim for any side-effects, decompensation, questions, or problems  Collaboration of Care: Collaboration of Care: Medication Management AEB Medication assessment, adjustment and refills  Patient/Guardian was advised Release of Information must be obtained prior to any record release in order to collaborate their care with an  outside provider. Patient/Guardian was advised if they have not already done so to contact the registration department to sign all necessary forms in order for us  to release information regarding their care.   Consent: Patient/Guardian gives verbal consent for treatment and assignment of benefits for services provided during this visit. Patient/Guardian expressed understanding and agreed to proceed.    Eustolia Drennen, NP 04/06/2024, 2:39 PM

## 2024-04-06 NOTE — Telephone Encounter (Signed)
 Medical Buy and Bill  Prior Authorization REQUIRED for PROLIA   PA PROCESS DETAILS: PA and Step Therapy are required and not on file. Please call Medical Review at  [phone] to initiate or to check status. PA can be submitted online thru [email or web address] or at:  [email or web address]

## 2024-04-06 NOTE — Patient Instructions (Signed)

## 2024-04-07 DIAGNOSIS — S46011D Strain of muscle(s) and tendon(s) of the rotator cuff of right shoulder, subsequent encounter: Secondary | ICD-10-CM | POA: Diagnosis not present

## 2024-04-07 NOTE — Telephone Encounter (Signed)
 Prior Authorization initiated for PROLIA  via [email or web address] KEY: [id number]

## 2024-04-13 NOTE — Telephone Encounter (Signed)
 Medical Buy and Zell  Prior Authorization for PROLIA  APPROVED PA# [id number] Valid: 10/25/23-09/29/24

## 2024-04-15 DIAGNOSIS — S46011D Strain of muscle(s) and tendon(s) of the rotator cuff of right shoulder, subsequent encounter: Secondary | ICD-10-CM | POA: Diagnosis not present

## 2024-04-20 ENCOUNTER — Ambulatory Visit (HOSPITAL_COMMUNITY): Admitting: Registered Nurse

## 2024-04-23 NOTE — Telephone Encounter (Signed)
 Medical Buy and Zell  Patient is ready for scheduling on or after 04/12/24  Out-of-pocket cost due at time of visit: $356.87   Primary: [employer] Advantage  [place] [facility]  Prolia  co-insurance: 20% (approximately $331.87) Admin fee co-insurance: 20% (approximately $25)  Deductible: does not apply  Prior Auth: APPROVED PA# [id number] Valid: 10/25/23-09/29/24  Secondary: N/A Prolia  co-insurance:  Admin fee co-insurance:  Deductible:  Prior Auth:  PA# Valid:   ** This summary of benefits is an estimation of the patient's out-of-pocket cost. Exact cost may vary based on individual plan coverage.

## 2024-05-03 DIAGNOSIS — M25511 Pain in right shoulder: Secondary | ICD-10-CM | POA: Diagnosis not present

## 2024-05-03 DIAGNOSIS — S46011D Strain of muscle(s) and tendon(s) of the rotator cuff of right shoulder, subsequent encounter: Secondary | ICD-10-CM | POA: Diagnosis not present

## 2024-05-05 ENCOUNTER — Other Ambulatory Visit (HOSPITAL_COMMUNITY): Payer: Self-pay | Admitting: Registered Nurse

## 2024-05-05 DIAGNOSIS — F331 Major depressive disorder, recurrent, moderate: Secondary | ICD-10-CM

## 2024-05-05 DIAGNOSIS — F411 Generalized anxiety disorder: Secondary | ICD-10-CM

## 2024-05-06 DIAGNOSIS — S46011D Strain of muscle(s) and tendon(s) of the rotator cuff of right shoulder, subsequent encounter: Secondary | ICD-10-CM | POA: Diagnosis not present

## 2024-05-07 ENCOUNTER — Ambulatory Visit

## 2024-05-11 DIAGNOSIS — M25511 Pain in right shoulder: Secondary | ICD-10-CM | POA: Diagnosis not present

## 2024-05-12 DIAGNOSIS — H5203 Hypermetropia, bilateral: Secondary | ICD-10-CM | POA: Diagnosis not present

## 2024-05-14 ENCOUNTER — Inpatient Hospital Stay: Attending: Hematology

## 2024-05-14 DIAGNOSIS — D72829 Elevated white blood cell count, unspecified: Secondary | ICD-10-CM

## 2024-05-14 DIAGNOSIS — C9110 Chronic lymphocytic leukemia of B-cell type not having achieved remission: Secondary | ICD-10-CM | POA: Insufficient documentation

## 2024-05-14 LAB — CBC WITH DIFFERENTIAL/PLATELET
Abs Immature Granulocytes: 0.02 K/uL (ref 0.00–0.07)
Basophils Absolute: 0 K/uL (ref 0.0–0.1)
Basophils Relative: 0 %
Eosinophils Absolute: 0.1 K/uL (ref 0.0–0.5)
Eosinophils Relative: 1 %
HCT: 33.6 % — ABNORMAL LOW (ref 36.0–46.0)
Hemoglobin: 11 g/dL — ABNORMAL LOW (ref 12.0–15.0)
Immature Granulocytes: 0 %
Lymphocytes Relative: 69 %
Lymphs Abs: 10.1 K/uL — ABNORMAL HIGH (ref 0.7–4.0)
MCH: 32.8 pg (ref 26.0–34.0)
MCHC: 32.7 g/dL (ref 30.0–36.0)
MCV: 100.3 fL — ABNORMAL HIGH (ref 80.0–100.0)
Monocytes Absolute: 0.5 K/uL (ref 0.1–1.0)
Monocytes Relative: 3 %
Neutro Abs: 3.9 K/uL (ref 1.7–7.7)
Neutrophils Relative %: 27 %
Platelets: 295 K/uL (ref 150–400)
RBC: 3.35 MIL/uL — ABNORMAL LOW (ref 3.87–5.11)
RDW: 13.4 % (ref 11.5–15.5)
Smear Review: NORMAL
WBC: 14.5 K/uL — ABNORMAL HIGH (ref 4.0–10.5)
nRBC: 0 % (ref 0.0–0.2)

## 2024-05-15 ENCOUNTER — Ambulatory Visit: Payer: Self-pay | Admitting: Hematology

## 2024-05-17 NOTE — Telephone Encounter (Addendum)
 Attempted to contact the patient via telephone call. Unable to reach the patient. LVM w/ provider's comments from below.  ----- Message from Onita Mattock sent at 05/15/2024  6:00 PM EDT ----- Please let pt know her CLL is stable, but she has mild anemia now, which she had last year. Please order ferritin, reticular panel, B12 and folate levels to be done in next few weeks if she wants, or  next lab in 3 months. She can take prenatal MVI, thx   Onita Mattock  ----- Message ----- From: Rebecka, Lab In [place] Sent: 05/14/2024  11:22 AM EDT To: Onita Mattock, MD

## 2024-05-18 ENCOUNTER — Ambulatory Visit: Payer: Self-pay

## 2024-05-18 ENCOUNTER — Encounter: Payer: Self-pay | Admitting: Family Medicine

## 2024-05-18 ENCOUNTER — Ambulatory Visit: Admitting: Family Medicine

## 2024-05-18 VITALS — BP 124/68 | HR 94 | Temp 98.1°F | Ht <= 58 in | Wt 123.0 lb

## 2024-05-18 DIAGNOSIS — M542 Cervicalgia: Secondary | ICD-10-CM

## 2024-05-18 MED ORDER — METHOCARBAMOL 500 MG PO TABS: 500.0000 mg | ORAL_TABLET | Freq: Three times a day (TID) | ORAL | 0 refills | Status: AC | PRN

## 2024-05-18 MED ORDER — PREDNISONE 10 MG PO TABS: ORAL_TABLET | ORAL | 0 refills | Status: AC

## 2024-05-18 NOTE — Progress Notes (Signed)
 Acute Office Visit   Subjective:  Patient ID: Jessica Casey, female    DOB: May 25, 1951, 73 y.o.   MRN: [id number]  Chief Complaint  Patient presents with   Torticollis    HPI Patient is present for an acute visit since Dr. Dickey, PCP, is not available. Patient is complaining of cervical pain that started Sunday morning when waking up. Also, is experiencing stiffness for 2 days with limited ROM. Pain described as aching and pressure. Constant.   Denies any numbness or tingling. She reports she is under the care of [facility] for right shoulder pain. She had a MRI recently and will be going back next Tuesday about MRI results. She reports she has been doing PT, up until MRI was completed. Now, doing exercises at home from physical therapy. She has been doing exercises as long as she can keep her head still.   She has tried Tylenol , rest, hydration, with no relief. Tried heat compresses with some relief.  ROS See HPI above      Objective:   BP 124/68   Pulse 94   Temp 98.1 F (36.7 C) (Oral)   Ht 4' 10 (1.473 m)   Wt 123 lb (55.8 kg)   LMP 10/01/1995 (Approximate)   SpO2 95%   BMI 25.71 kg/m    Physical Exam Vitals reviewed.  Constitutional:      General: She is not in acute distress.    Appearance: Normal appearance. She is not ill-appearing, toxic-appearing or diaphoretic.  HENT:     Head: Normocephalic and atraumatic.  Eyes:     General:        Right eye: No discharge.        Left eye: No discharge.     Conjunctiva/sclera: Conjunctivae normal.  Cardiovascular:     Rate and Rhythm: Normal rate.  Pulmonary:     Effort: Pulmonary effort is normal. No respiratory distress.  Musculoskeletal:     Cervical back: No swelling, deformity, signs of trauma, tenderness or crepitus. Pain with movement present. Decreased range of motion.  Skin:    General: Skin is warm and dry.  Neurological:     General: No focal deficit present.     Mental Status: She is alert and  oriented to person, place, and time. Mental status is at baseline.  Psychiatric:        Mood and Affect: Mood normal.        Behavior: Behavior normal.        Thought Content: Thought content normal.        Judgment: Judgment normal.       Assessment & Plan:  Cervical muscle pain -     Methocarbamol ; Take 1 tablet (500 mg total) by mouth every 8 (eight) hours as needed for up to 5 days.  Dispense: 15 tablet; Refill: 0 -     predniSONE ; Take 6 tablets (60 mg total) by mouth daily with breakfast for 1 day, THEN 5 tablets (50 mg total) daily with breakfast for 1 day, THEN 4 tablets (40 mg total) daily with breakfast for 1 day, THEN 3 tablets (30 mg total) daily with breakfast for 1 day, THEN 2 tablets (20 mg total) daily with breakfast for 1 day, THEN 1 tablet (10 mg total) daily with breakfast for 1 day.  Dispense: 21 tablet; Refill: 0    -Prescribed Methocarbamol  (Robaxin ) 500mg  tablet, take 1 tablet every 8 hours as needed for muscle cramp. Caution: Medication can cause drowsiness. -  Prescribed Prednisone  10mg , 6 day taper for pain. Please do not take NSAIDS (Iburprofen, Advil , Naproxen, or Aleve) while taking medication. May take Tylenol  1000mg  every 8 hours while taking Prednisone  to help with pain. -Continue using heat compresses 4-6 times a day, up to 20 minutes at a time.  -Continue to do shoulder exercises as tolerated.  -Follow up if not improved.     Braxdon Gappa, NP

## 2024-05-18 NOTE — Telephone Encounter (Signed)
 FYI Only or Action Required?: FYI only for provider.  Patient was last seen in primary care on 03/23/2024 by [place], [name], MD.  Called Nurse Triage reporting Neck Pain.  Symptoms began several days ago.  Interventions attempted: OTC medications: Tylenol  and Rest, hydration, or home remedies.  Symptoms are: unchanged.  Triage Disposition: See HCP Within 4 Hours (Or PCP Triage)  Patient/caregiver understands and will follow disposition?: Yes  Copied from CRM #[id number]. Topic: Clinical - Red Word Triage >> May 18, 2024 10:12 AM Adelita E wrote: [facility] that prompted transfer to Nurse Triage: Neck pain. Patient stated her neck has been stiff for 2 days, cannot turn head or move up and down. Reason for Disposition  [1] SEVERE neck pain (e.g., excruciating, unable to do any normal activities) AND [2] not improved after 2 hours of pain medicine  Answer Assessment - Initial Assessment Questions 1. ONSET: When did the pain begin?      Sunday 2. LOCATION: Where does it hurt?      Back of neck  3. PATTERN Does the pain come and go, or has it been constant since it started?      constant 4. SEVERITY: How bad is the pain?  (Scale 0-10; or none or slight stiffness, mild, moderate, severe)     severe 5. RADIATION: Does the pain go anywhere else, shoot into your arms?     Denies  6. CORD SYMPTOMS: Any weakness or numbness of the arms or legs?     Denies  7. CAUSE: What do you think is causing the neck pain?     Sleeping position 8. NECK OVERUSE: Any recent activities that involved turning or twisting the neck?     No woke up with stiff neck 9. OTHER SYMPTOMS: Do you have any other symptoms? (e.g., headache, fever, chest pain, difficulty breathing, neck swelling)     Very limited ROM  Protocols used: Neck Pain or Stiffness-A-AH

## 2024-05-18 NOTE — Patient Instructions (Signed)
-  It was a pleasure to taking care of you.  -Prescribed Methocarbamol  (Robaxin ) 500mg  tablet, take 1 tablet every 8 hours as needed for muscle cramp. Caution: Medication can cause drowsiness. -Prescribed Prednisone  10mg , 6 day taper for pain. Please do not take NSAIDS (Iburprofen, Advil , Naproxen, or Aleve) while taking medication. May take Tylenol  1000mg  every 8 hours while taking Prednisone  to help with pain. -Continue using heat compresses 4-6 times a day, up to 20 minutes at a time.  -Continue to do shoulder exercises as tolerated.  -Follow up if not improved.

## 2024-05-26 DIAGNOSIS — M25511 Pain in right shoulder: Secondary | ICD-10-CM | POA: Diagnosis not present

## 2024-06-03 ENCOUNTER — Ambulatory Visit: Admitting: Family Medicine

## 2024-06-03 ENCOUNTER — Encounter: Payer: Self-pay | Admitting: Family Medicine

## 2024-06-03 DIAGNOSIS — Z0000 Encounter for general adult medical examination without abnormal findings: Secondary | ICD-10-CM

## 2024-06-03 NOTE — Patient Instructions (Signed)
 I really enjoyed getting to talk with you today! I am available on Tuesdays and Thursdays for virtual visits if you have any questions or concerns, or if I can be of any further assistance.   CHECKLIST FROM ANNUAL WELLNESS VISIT:  -Follow up (please call to schedule if not scheduled after visit):   -yearly for annual wellness visit with primary care office  Here is a list of your preventive care/health maintenance measures and the plan for each if any are due:  PLAN For any measures below that may be due:    1. Check with your pharmacy and ask them to print a copy of your vaccines so that we can update your record here. Can get flu shot at our office. Can get the rest at the pharmacy.  Health Maintenance  Topic Date Due   Pneumococcal Vaccine: 50+ Years (2 of 2 - PPSV23, PCV20, or PCV21) 04/01/2018   INFLUENZA VACCINE  04/30/2024   COVID-19 Vaccine (7 - Pfizer risk 2024-25 season) 05/31/2024   Medicare Annual Wellness (AWV)  06/03/2025   MAMMOGRAM  06/09/2025   Colonoscopy  07/09/2027   DTaP/Tdap/Td (3 - Td or Tdap) 06/13/2029   DEXA SCAN  Completed   Hepatitis C Screening  Completed   Zoster Vaccines- Shingrix  Completed   HPV VACCINES  Aged Out   Meningococcal B Vaccine  Aged Out    -See a dentist at least yearly  -Get your eyes checked and then per your eye specialist's recommendations  -Other issues addressed today:   -I have included below further information regarding a healthy whole foods based diet, physical activity guidelines for adults, stress management and opportunities for social connections. I hope you find this information useful.   -----------------------------------------------------------------------------------------------------------------------------------------------------------------------------------------------------------------------------------------------------------    NUTRITION: -eat real food: lots of colorful vegetables (half the plate) and  fruits -5-7 servings of vegetables and fruits per day (fresh or steamed is best), exp. 2 servings of vegetables with lunch and dinner and 2 servings of fruit per day. Berries and greens such as kale and collards are great choices.  -consume on a regular basis:  fresh fruits, fresh veggies, fish, nuts, seeds, healthy oils (such as olive oil, avocado oil), whole grains (make sure for bread/pasta/crackers/etc., that the first ingredient on label contains the word whole), legumes. -can eat small amounts of dairy and lean meat (no larger than the palm of your hand), but avoid processed meats such as ham, bacon, lunch meat, etc. -drink water  -try to avoid fast food and pre-packaged foods, processed meat, ultra processed foods/beverages (donuts, candy, etc.) -most experts advise limiting sodium to < 2300mg  per day, should limit further is any chronic conditions such as high blood pressure, heart disease, diabetes, etc. The American Heart Association advised that < 1500mg  is is ideal -try to avoid foods/beverages that contain any ingredients with names you do not recognize  -try to avoid foods/beverages  with added sugar or sweeteners/sweets  -try to avoid sweet drinks (including diet drinks): soda, juice, Gatorade, sweet tea, power drinks, diet drinks -try to avoid white rice, white bread, pasta (unless whole grain)  EXERCISE GUIDELINES FOR ADULTS: -if you wish to increase your physical activity, do so gradually and with the approval of your doctor -STOP and seek medical care immediately if you have any chest pain, chest discomfort or trouble breathing when starting or increasing exercise  -move and stretch your body, legs, feet and arms when sitting for long periods -Physical activity guidelines for optimal health in adults: -  get at least 150 minutes per week of moderate exercise (can talk, but not sing); this is about 20-30 minutes of sustained activity 5-7 days per week or two 10-15 minute episodes of  sustained activity 5-7 days per week -do some muscle building/resistance training/strength training at least 2 days per week  -balance exercises 3+ days per week:   Stand somewhere where you have something sturdy to hold onto if you lose balance    1) lift up on toes, then back down, start with 5x per day and work up to 20x   2) stand and lift one leg straight out to the side so that foot is a few inches of the floor, start with 5x each side and work up to 20x each side   3) stand on one foot, start with 5 seconds each side and work up to 20 seconds on each side  If you need ideas or help with getting more active:  -Silver sneakers https://tools.silversneakers.com  -Walk with a Doc: http://www.duncan-[email or web address]/  -try to include resistance (weight lifting/strength building) and balance exercises twice per week: or the following link for ideas: http://castillo-powell.com/  BuyDucts.dk  STRESS MANAGEMENT: -can try meditating, or just sitting quietly with deep breathing while intentionally relaxing all parts of your body for 5 minutes daily -if you need further help with stress, anxiety or depression please follow up with your primary doctor or contact the wonderful folks at [facility]: [phone]  SOCIAL CONNECTIONS: -options in [place] if you wish to engage in more social and exercise related activities:  -Silver sneakers https://tools.silversneakers.com  -Walk with a Doc: http://www.duncan-[email or web address]/  -Check out the [facility] Active Adults 50+ section on the [place] [employer] (hiking clubs, book clubs, cards and games, chess, exercise classes, aquatic classes and much more) - see the website for details: https://www.[place].gov/departments/parks-recreation/active-adults50  -YouTube has lots of exercise videos for different ages and abilities as well  -[facility] (a variety of indoor and outdoor inperson activities for adults). [phone]. [address]  -Virtual Online Classes (a variety of topics): see seniorplanet.org or call [phone]  -consider volunteering at a school, hospice center, church, senior center or elsewhere

## 2024-06-03 NOTE — Progress Notes (Signed)
 Patient unable to obtain vital sign due to telehealth visit

## 2024-06-03 NOTE — Progress Notes (Signed)
 PATIENT CHECK-IN and HEALTH RISK ASSESSMENT QUESTIONNAIRE:  -completed by phone/video for upcoming Medicare Preventive Visit    Pre-Visit Check-in: 1)Vitals (height, wt, BP, etc) - record in vitals section for visit on day of visit Request home vitals (wt, BP, etc.) and enter into vitals, THEN update Vital Signs SmartPhrase below at the top of the HPI. See below.  2)Review and Update Medications, Allergies PMH, Surgeries, Social history in Epic 3)Hospitalizations in the last year with date/reason? n  4)Review and Update Care Team (patient's specialists) in Epic 5) Complete PHQ9 in Epic  6) Complete Fall Screening in Epic 7)Review all Health Maintenance Due and order if not done.  Medicare Wellness Patient Questionnaire:  Answer theses question about your habits: How often do you have a drink containing alcohol?n/a How many drinks containing alcohol do you have on a typical day when you are drinking?n/a  How often do you have six or more drinks on one occasion?n/a  Have you ever smoked?no  Quit date if applicable? N/a   How many packs a day do/did you smoke? N/a Do you use smokeless tobacco?no Do you use an illicit drugs?no On average, how many days per week do you engage in moderate to strenuous exercise (like a brisk walk)? 2 days  On average, how many minutes do you engage in exercise at this level? 20 mins, walking Typical breakfast: Varies, carnation instant breakfast Typical lunch: eggs, cheese, crackers Typical dinner: proteins, veggies Typical snacks:n/a  Beverages: water , gave up sodas about 3 weeks ago  Answer theses question about your everyday activities: Can you perform most household chores?yes Are you deaf or have significant trouble hearing?Yes, has hearing aids Do you feel that you have a problem with memory?no Do you feel safe at home?y Last dentist visit?goes on regular basis 8. Do you have any difficulty performing your everyday activities?no Are you having  any difficulty walking, taking medications on your own, and or difficulty managing daily home needs?no Do you have difficulty walking or climbing stairs?no Do you have difficulty dressing or bathing?no Do you have difficulty doing errands alone such as visiting a doctor's office or shopping?no Do you currently have any difficulty preparing food and eating?no Do you currently have any difficulty using the toilet?no Do you have any difficulty managing your finances?no Do you have any difficulties with housekeeping of managing your housekeeping?no   Do you have Advanced Directives in place (Living Will, Healthcare Power or Attorney)? yes   Last eye Exam and location? Goes every year   Do you currently use prescribed or non-prescribed narcotic or opioid pain medications?n  Do you have a history or close family history of breast, ovarian, tubal or peritoneal cancer or a family member with BRCA (breast cancer susceptibility 1 and 2) gene mutations? See pmh/fh   Nurse/Assistant Credentials/time stamp: Mg 2:10 PM     ----------------------------------------------------------------------------------------------------------------------------------------------------------------------------------------------------------------------  Because this visit was a virtual/telehealth visit, some criteria may be missing or patient reported. Any vitals not documented were not able to be obtained and vitals that have been documented are patient reported.    MEDICARE ANNUAL PREVENTIVE VISIT WITH PROVIDER: (Welcome to Medicare, initial annual wellness or annual wellness exam)  Virtual Visit via Video Note  I connected with SHATORI BERTUCCI on 06/03/24 by a video enabled telemedicine application and verified that I am speaking with the correct person using two identifiers.  Location patient: home Location provider:work or home office Persons participating in the virtual visit: patient, provider  Concerns  and/or  follow up today: doing well other than R shoulder issues - seeing ortho and considering surgery.   See HM section in Epic for other details of completed HM.    ROS: negative for report of fevers, unintentional weight loss, vision changes, vision loss, hearing loss or change, chest pain, sob, hemoptysis, melena, hematochezia, hematuria, falls, bleeding or bruising, thoughts of suicide or self harm, memory loss  Patient-completed extensive health risk assessment - reviewed and discussed with the patient: See Health Risk Assessment completed with patient prior to the visit either above or in recent phone note. This was reviewed in detailed with the patient today and appropriate recommendations, orders and referrals were placed as needed per Summary below and patient instructions.   Review of Medical History: -PMH, PSH, Family History and current specialty and care providers reviewed and updated and listed below   Patient Care Team: [place], [name], MD as PCP - General (Family Medicine) Cathlyn JAYSON Nikki Bobie FORBES, MD as Consulting Physician (Obstetrics and Gynecology)   Past Medical History:  Diagnosis Date   Anxiety    Cataract    bilateral removed   Colonic polyp 2005 & 2013   DDD (degenerative disc disease)    Depression    Diverticulitis large intestine 12/12/2022   GAD (generalized anxiety disorder) 11/11/2023   GERD (gastroesophageal reflux disease)    H/O echocardiogram    before 2000, no need for f/u /w cardiac    Heart murmur    MVP- echo- 2010, no symptoms    MDD (major depressive disorder), recurrent episode, moderate (HCC) 11/11/2023   Memory disorder 09/19/2017   Mononucleosis 1971   Osteopenia after menopause    Ovarian mass, left 12/12/2022   See MRI 12/12/22   Sleep apnea    cpap    Past Surgical History:  Procedure Laterality Date   ANTERIOR CERVICAL DECOMP/DISCECTOMY FUSION N/A 05/20/2013   Procedure: ANTERIOR CERVICAL DECOMPRESSION/DISCECTOMY FUSION  2 LEVELS;  Surgeon: Fairy Levels, MD;  Location: [facility];  Service: Neurosurgery;  Laterality: N/A;  Cervical Seven-Thoracic One, Thoracic One-Two Anterior cervical decompression/Diskectomy/Fusion   BACK SURGERY     CARPAL TUNNEL RELEASE Right    CATARACT EXTRACTION, BILATERAL  10/01/2011   Dr Roz, EFRAIM IOL   CERVICAL FUSION  2014   3 proceduresr Levels, 2007, 20012 and 2014   COLONOSCOPY  2018   SA- TA   colonoscopy with polypectomy  10/01/2011   Dr Debrah   KNEE SURGERY     Dr Willy ; bursa cystectomy   LUMBAR DISC SURGERY  2010   Dr.Stern and 1999   ROBOTIC ASSISTED TOTAL HYSTERECTOMY WITH BILATERAL SALPINGO OOPHERECTOMY Bilateral 01/28/2023   Procedure: XI ROBOTIC ASSISTED TOTAL HYSTERECTOMY WITH BILATERAL SALPINGO OOPHORECTOMY;  Surgeon: Eldonna Mays, MD;  Location: [facility];  Service: Gynecology;  Laterality: Bilateral;   ROTATOR CUFF REPAIR     Dr.Wainer   TOTAL HIP ARTHROPLASTY Right 09/04/2021   Procedure: TOTAL HIP ARTHROPLASTY ANTERIOR APPROACH;  Surgeon: Beverley Evalene BIRCH, MD;  Location: [facility];  Service: Orthopedics;  Laterality: Right;   UMBILICAL HERNIA REPAIR  1952   as a baby   WRIST FRACTURE SURGERY Left 02/28/2013   Dr. Toribio Beverley    Social History   Socioeconomic History   Marital status: Married    Spouse name: Not on file   Number of children: 2   Years of education: 16+   Highest education level: Master's degree (e.g., MA, MS, MEng, MEd, MSW, MBA)  Occupational History  Occupation: Runner, broadcasting/film/video- Retired  Tobacco Use   Smoking status: Never   Smokeless tobacco: Never  Vaping Use   Vaping status: Never Used  Substance and Sexual Activity   Alcohol use: Never   Drug use: Never   Sexual activity: Not Currently    Partners: Male    Birth control/protection: Post-menopausal  Other Topics Concern   Not on file  Social History Narrative   Lives   Caffeine use:    Right handed    Social Drivers of Health   Financial Resource Strain: Low  Risk  (06/03/2024)   Overall Financial Resource Strain (CARDIA)    Difficulty of Paying Living Expenses: Not hard at all  Food Insecurity: No Food Insecurity (06/03/2024)   Hunger Vital Sign    Worried About Running Out of Food in the Last Year: Never true    Ran Out of Food in the Last Year: Never true  Transportation Needs: No Transportation Needs (06/03/2024)   PRAPARE - Administrator, Civil Service (Medical): No    Lack of Transportation (Non-Medical): No  Physical Activity: Insufficiently Active (06/03/2024)   Exercise Vital Sign    Days of Exercise per Week: 2 days    Minutes of Exercise per Session: 20 min  Stress: Stress Concern Present (06/03/2024)   [employer] of Occupational Health - Occupational Stress Questionnaire    Feeling of Stress: Very much  Social Connections: Socially Integrated (06/03/2024)   Social Connection and Isolation Panel    Frequency of Communication with Friends and Family: More than three times a week    Frequency of Social Gatherings with Friends and Family: More than three times a week    Attends Religious Services: More than 4 times per year    Active Member of Clubs or Organizations: Yes    Attends Banker Meetings: More than 4 times per year    Marital Status: Married  Catering manager Violence: Not At Risk (06/03/2024)   Humiliation, Afraid, Rape, and Kick questionnaire    Fear of Current or Ex-Partner: No    Emotionally Abused: No    Physically Abused: No    Sexually Abused: No    Family History  Problem Relation Age of Onset   Breast cancer Mother 59       estorgen receptor positive   Stroke Mother 48       Dec   Diabetes Father    Coronary artery disease Father        S/P CBAG , no MI -- Dec   Depression Father    Diverticulosis Father    Breast cancer Sister 1       estrogen receptor positive   Depression Sister    Depression Sister    Depression Sister    Depression Sister    Colon polyps Sister     Depression Brother    Colon cancer Maternal Grandfather    Bipolar disorder Other        Neice   Esophageal cancer Neg Hx    Pancreatic cancer Neg Hx    Rectal cancer Neg Hx    Stomach cancer Neg Hx    Ovarian cancer Neg Hx    Endometrial cancer Neg Hx     Current Outpatient Medications on File Prior to Visit  Medication Sig Dispense Refill   acetaminophen  (TYLENOL ) 650 MG CR tablet Take 1,300 mg by mouth every 8 (eight) hours as needed for pain.     Bacillus Coagulans-Inulin (ALIGN PREBIOTIC-PROBIOTIC  PO) Take by mouth.     Biotin  1000 MCG tablet Take 1,000 mcg by mouth daily.     buPROPion  (WELLBUTRIN  XL) 300 MG 24 hr tablet Take 1 tablet (300 mg total) by mouth daily. 30 tablet 1   busPIRone  (BUSPAR ) 7.5 MG tablet Take 1 tablet (7.5 mg total) by mouth 2 (two) times daily. 60 tablet 1   Calcium  Carb-Cholecalciferol  (CALCIUM  600 + D PO) Take 1 tablet by mouth daily.     Cholecalciferol  (VITAMIN D ) 2000 UNITS CAPS Take 2,000 Units by mouth daily.     Cyanocobalamin (B-12) 1000 MCG CAPS Take 1,000 mcg by mouth daily.     denosumab  (PROLIA ) 60 MG/ML SOSY injection Inject 60 mg into the skin every 6 (six) months. At MD office, last done 03/2022     diclofenac  sodium (VOLTAREN ) 1 % GEL Apply 2 g topically 4 (four) times daily. (Patient taking differently: Apply 2 g topically 4 (four) times daily as needed (pain).) 3 Tube 3   hydrOXYzine  (ATARAX ) 10 MG tablet Take 10 mg - 20 mg (1-2 tablets) three times daily as needed for anxiety or sleep 60 tablet 1   Multiple Vitamin (MULTIVITAMIN WITH MINERALS) TABS tablet Take 1 tablet by mouth daily.     omeprazole  (PRILOSEC) 40 MG capsule TAKE 1 CAPSULE (40 MG TOTAL) BY MOUTH DAILY. 90 capsule 1   sertraline  (ZOLOFT ) 50 MG tablet Take 1 tablet (50 mg total) by mouth daily. 30 tablet 1   Current Facility-Administered Medications on File Prior to Visit  Medication Dose Route Frequency Provider Last Rate Last Admin   denosumab  (PROLIA ) injection 60 mg   60 mg Subcutaneous Once Gherghe, Cristina, MD        Allergies  Allergen Reactions   Gabapentin     Tongue swelling and itching        Physical Exam Vitals requested from patient and listed below if patient had equipment and was able to obtain at home for this virtual visit: There were no vitals filed for this visit. Estimated body mass index is 25.71 kg/m as calculated from the following:   Height as of 05/18/24: 4' 10 (1.473 m).   Weight as of 05/18/24: 123 lb (55.8 kg).  EKG (optional): deferred due to virtual visit  GENERAL: alert, oriented, no acute distress detected, full vision exam deferred due to pandemic and/or virtual encounter   HEENT: atraumatic, conjunttiva clear, no obvious abnormalities on inspection of external nose and ears  NECK: normal movements of the head and neck  LUNGS: on inspection no signs of respiratory distress, breathing rate appears normal, no obvious gross SOB, gasping or wheezing  CV: no obvious cyanosis  MS: moves all visible extremities without noticeable abnormality  PSYCH/NEURO: pleasant and cooperative, no obvious depression or anxiety, speech and thought processing grossly intact, Cognitive function grossly intact  Flowsheet Row Clinical Support from 06/03/2024 in [facility]  PHQ-9 Total Score 2        06/03/2024    2:07 PM 05/18/2024   11:23 AM 04/06/2024    2:20 PM 11/11/2023    1:07 PM 04/24/2023   10:51 AM  Depression screen PHQ 2/9  Decreased Interest 0 1   0  Down, Depressed, Hopeless 0 1   0  PHQ - 2 Score 0 2   0  Altered sleeping 2 1   1   Tired, decreased energy 0 1   0  Change in appetite 0 1   0  Feeling bad or failure about yourself  0 1   0  Trouble concentrating 0 1   0  Moving slowly or fidgety/restless 0 0   0  Suicidal thoughts 0 2   0  PHQ-9 Score 2 9   1   Difficult doing work/chores Not difficult at all Somewhat difficult   Not difficult at all     Information is confidential  and restricted. Go to Review Flowsheets to unlock data.       12/20/2022   12:29 PM 04/24/2023   10:53 AM 05/18/2024   11:23 AM 06/02/2024   10:19 PM 06/03/2024    2:01 PM  Fall Risk  Falls in the past year? 0 0 0 0 0  Was there an injury with Fall? 0 0 0 0 0  Fall Risk Category Calculator 0 0 0 0  0  Patient at Risk for Falls Due to Other (Comment) No Fall Risks No Fall Risks  No Fall Risks  Fall risk Follow up Falls evaluation completed Falls evaluation completed Falls evaluation completed  Falls evaluation completed     Patient-reported     SUMMARY AND PLAN:  Encounter for Medicare annual wellness exam   Discussed applicable health maintenance/preventive health measures and advised and referred or ordered per patient preferences: -discussed vaccines due, she says she usually get them at the pharmacy, agrees to check and see which she still needs and grees to bring copy of record from pharmacy so that we can update her chart Health Maintenance  Topic Date Due   Pneumococcal Vaccine: 50+ Years (2 of 2 - PPSV23, PCV20, or PCV21) 04/01/2018   INFLUENZA VACCINE  04/30/2024   COVID-19 Vaccine (7 - Pfizer risk 2024-25 season) 05/31/2024   Medicare Annual Wellness (AWV)  06/03/2025   MAMMOGRAM  06/09/2025   Colonoscopy  07/09/2027   DTaP/Tdap/Td (3 - Td or Tdap) 06/13/2029   DEXA SCAN  Completed   Hepatitis C Screening  Completed   Zoster Vaccines- Shingrix  Completed   HPV VACCINES  Aged Out   Meningococcal B Vaccine  Aged Out      Education and counseling on the following was provided based on the above review of health and a plan/checklist for the patient, along with additional information discussed, was provided for the patient in the patient instructions :  -Advised and counseled on a healthy lifestyle - including the importance of a healthy diet, regular physical activity -Reviewed patient's current diet. Advised and counseled on a whole foods based healthy diet. A summary  of a healthy diet was provided in the Patient Instructions.  -reviewed patient's current physical activity level and discussed exercise guidelines for adults. Discussed community resources and ideas for safe exercise at home to assist in meeting exercise guideline recommendations in a safe and healthy way. Advised to try to increase to 150 minutes of exercise per week.  -Advise yearly dental visits at minimum and regular eye exams   Follow up: see patient instructions     Patient Instructions  I really enjoyed getting to talk with you today! I am available on Tuesdays and Thursdays for virtual visits if you have any questions or concerns, or if I can be of any further assistance.   CHECKLIST FROM ANNUAL WELLNESS VISIT:  -Follow up (please call to schedule if not scheduled after visit):   -yearly for annual wellness visit with primary care office  Here is a list of your preventive care/health maintenance measures and the plan for each  if any are due:  PLAN For any measures below that may be due:    1. Check with your pharmacy and ask them to print a copy of your vaccines so that we can update your record here. Can get flu shot at our office. Can get the rest at the pharmacy.  Health Maintenance  Topic Date Due   Pneumococcal Vaccine: 50+ Years (2 of 2 - PPSV23, PCV20, or PCV21) 04/01/2018   INFLUENZA VACCINE  04/30/2024   COVID-19 Vaccine (7 - Pfizer risk 2024-25 season) 05/31/2024   Medicare Annual Wellness (AWV)  06/03/2025   MAMMOGRAM  06/09/2025   Colonoscopy  07/09/2027   DTaP/Tdap/Td (3 - Td or Tdap) 06/13/2029   DEXA SCAN  Completed   Hepatitis C Screening  Completed   Zoster Vaccines- Shingrix  Completed   HPV VACCINES  Aged Out   Meningococcal B Vaccine  Aged Out    -See a dentist at least yearly  -Get your eyes checked and then per your eye specialist's recommendations  -Other issues addressed today:   -I have included below further information regarding a  healthy whole foods based diet, physical activity guidelines for adults, stress management and opportunities for social connections. I hope you find this information useful.   -----------------------------------------------------------------------------------------------------------------------------------------------------------------------------------------------------------------------------------------------------------    NUTRITION: -eat real food: lots of colorful vegetables (half the plate) and fruits -5-7 servings of vegetables and fruits per day (fresh or steamed is best), exp. 2 servings of vegetables with lunch and dinner and 2 servings of fruit per day. Berries and greens such as kale and collards are great choices.  -consume on a regular basis:  fresh fruits, fresh veggies, fish, nuts, seeds, healthy oils (such as olive oil, avocado oil), whole grains (make sure for bread/pasta/crackers/etc., that the first ingredient on label contains the word whole), legumes. -can eat small amounts of dairy and lean meat (no larger than the palm of your hand), but avoid processed meats such as ham, bacon, lunch meat, etc. -drink water  -try to avoid fast food and pre-packaged foods, processed meat, ultra processed foods/beverages (donuts, candy, etc.) -most experts advise limiting sodium to < 2300mg  per day, should limit further is any chronic conditions such as high blood pressure, heart disease, diabetes, etc. The American Heart Association advised that < 1500mg  is is ideal -try to avoid foods/beverages that contain any ingredients with names you do not recognize  -try to avoid foods/beverages  with added sugar or sweeteners/sweets  -try to avoid sweet drinks (including diet drinks): soda, juice, Gatorade, sweet tea, power drinks, diet drinks -try to avoid white rice, white bread, pasta (unless whole grain)  EXERCISE GUIDELINES FOR ADULTS: -if you wish to increase your physical activity, do so  gradually and with the approval of your doctor -STOP and seek medical care immediately if you have any chest pain, chest discomfort or trouble breathing when starting or increasing exercise  -move and stretch your body, legs, feet and arms when sitting for long periods -Physical activity guidelines for optimal health in adults: -get at least 150 minutes per week of moderate exercise (can talk, but not sing); this is about 20-30 minutes of sustained activity 5-7 days per week or two 10-15 minute episodes of sustained activity 5-7 days per week -do some muscle building/resistance training/strength training at least 2 days per week  -balance exercises 3+ days per week:   Stand somewhere where you have something sturdy to hold onto if you lose balance    1) lift up  on toes, then back down, start with 5x per day and work up to 20x   2) stand and lift one leg straight out to the side so that foot is a few inches of the floor, start with 5x each side and work up to 20x each side   3) stand on one foot, start with 5 seconds each side and work up to 20 seconds on each side  If you need ideas or help with getting more active:  -Silver sneakers https://tools.silversneakers.com  -Walk with a Doc: http://www.duncan-williams.com/  -try to include resistance (weight lifting/strength building) and balance exercises twice per week: or the following link for ideas: http://castillo-powell.com/  BuyDucts.dk  STRESS MANAGEMENT: -can try meditating, or just sitting quietly with deep breathing while intentionally relaxing all parts of your body for 5 minutes daily -if you need further help with stress, anxiety or depression please follow up with your primary doctor or contact the wonderful folks at [facility]: [phone]  SOCIAL CONNECTIONS: -options in [place] if you wish to engage in more social and  exercise related activities:  -Silver sneakers https://tools.silversneakers.com  -Walk with a Doc: http://www.duncan-williams.com/  -Check out the [facility] Active Adults 50+ section on the [place] of [employer] (hiking clubs, book clubs, cards and games, chess, exercise classes, aquatic classes and much more) - see the website for details: https://www.[place].gov/departments/parks-recreation/active-adults50  -YouTube has lots of exercise videos for different ages and abilities as well  -[facility] (a variety of indoor and outdoor inperson activities for adults). 5076240823. [address]  -Virtual Online Classes (a variety of topics): see seniorplanet.org or call 5646474077  -consider volunteering at a school, hospice center, church, senior center or elsewhere            Chiquita JONELLE Cramp, DO

## 2024-06-04 ENCOUNTER — Encounter: Payer: Self-pay | Admitting: Family Medicine

## 2024-06-04 ENCOUNTER — Ambulatory Visit: Payer: Self-pay | Admitting: Family Medicine

## 2024-06-04 ENCOUNTER — Ambulatory Visit: Admitting: Family Medicine

## 2024-06-04 VITALS — BP 138/70 | HR 114 | Resp 16 | Ht <= 58 in | Wt 122.2 lb

## 2024-06-04 DIAGNOSIS — N1831 Chronic kidney disease, stage 3a: Secondary | ICD-10-CM

## 2024-06-04 DIAGNOSIS — Z01818 Encounter for other preprocedural examination: Secondary | ICD-10-CM | POA: Diagnosis not present

## 2024-06-04 LAB — BASIC METABOLIC PANEL WITH GFR
BUN: 17 mg/dL (ref 6–23)
CO2: 29 meq/L (ref 19–32)
Calcium: 9.2 mg/dL (ref 8.4–10.5)
Chloride: 102 meq/L (ref 96–112)
Creatinine, Ser: 1.18 mg/dL (ref 0.40–1.20)
GFR: 45.6 mL/min — ABNORMAL LOW (ref >60.00)
Glucose, Bld: 87 mg/dL (ref 70–99)
Potassium: 4 meq/L (ref 3.5–5.1)
Sodium: 140 meq/L (ref 135–145)

## 2024-06-04 LAB — MICROALBUMIN / CREATININE URINE RATIO
Creatinine,U: 174.2 mg/dL
Microalb Creat Ratio: 15.8 mg/g (ref 0.0–30.0)
Microalb, Ur: 2.7 mg/dL — ABNORMAL HIGH (ref 0.0–1.9)

## 2024-06-04 NOTE — Progress Notes (Signed)
 Chief Complaint  Patient presents with   Pre-op Exam   Discussed the use of AI scribe software for clinical note transcription with the patient, who gave verbal consent to proceed. History of Present Illness Jessica Casey is a 73 year old female with past medical history significant for chronic kidney disease 3, anxiety, osteoporosis, sensorineural hearing loss, OSA on CPAP, depression, and chronic lymphocytic leukemia who presents for surgical clearance requested by Dr Josefina, planning on right reverse total shoulder arthroplasty.  Date has not been determined. Planning on interscalene block and sedation.  She had a rotator cuff repair in 2009.  Her past surgical history includes bunion surgery in 2020, right hip replacement in 2022, oral surgery with local anesthesia, and a hysterectomy in 2024. She experienced nausea and vomiting during her last colonoscopy but in general no hx of complications during procedures or related with anesthesia. She denies any CP, dyspnea, palpitation, or diaphoresis upon climbing a flight of stairs, walking a [place] briskly, or with heavy lifting.  CKD III: Negative for foamy urine, gross hematuria, or decreased urine output. Chronic lymphocytic leukemia, which is being monitored without active treatment.  She follows with oncology regularly. She also has sleep apnea and uses a CPAP machine.  No history of CAD, CVA, diabetes, or heart failure. She does not smoke. Anxiety and depression: She follows with psychiatrist. Currently on sertraline  50 mg daily and buspirone  7.5 mg twice daily. Osteoporosis on Prolia , she follows with endocrinologist. GERD: She takes omeprazole  40 mg daily.  She also takes over-the-counter supplements such as multivitamins, biotin , calcium , vitamin D , and vitamin B12.   Lab Results  Component Value Date   NA 140 06/04/2024   CL 102 06/04/2024   K 4.0 06/04/2024   CO2 29 06/04/2024   BUN 17 06/04/2024   CREATININE 1.18  06/04/2024   GFRNONAA 46 (L) 01/29/2024   CALCIUM  9.2 06/04/2024   ALBUMIN 4.3 01/29/2024   GLUCOSE 87 06/04/2024   Lab Results  Component Value Date   WBC 14.5 (H) 05/14/2024   HGB 11.0 (L) 05/14/2024   HCT 33.6 (L) 05/14/2024   MCV 100.3 (H) 05/14/2024   PLT 295 05/14/2024   Review of Systems  Constitutional:  Negative for activity change, appetite change, chills and fever.  HENT:  Negative for mouth sores and sore throat.   Eyes:  Negative for redness and visual disturbance.  Respiratory:  Negative for cough and shortness of breath.   Cardiovascular:  Negative for chest pain, palpitations and leg swelling.  Gastrointestinal:  Negative for abdominal pain, nausea and vomiting.  Endocrine: Negative for cold intolerance and heat intolerance.  Genitourinary:  Negative for decreased urine volume and dysuria.  Musculoskeletal:  Positive for arthralgias.  Skin:  Negative for rash.  Allergic/Immunologic: Positive for environmental allergies.  Neurological:  Negative for syncope, weakness and headaches.  Psychiatric/Behavioral:  Negative for confusion and hallucinations.   See other pertinent positives and negatives in HPI.  Current Outpatient Medications on File Prior to Visit  Medication Sig Dispense Refill   acetaminophen  (TYLENOL ) 650 MG CR tablet Take 1,300 mg by mouth every 8 (eight) hours as needed for pain.     Bacillus Coagulans-Inulin (ALIGN PREBIOTIC-PROBIOTIC PO) Take by mouth.     Biotin  1000 MCG tablet Take 1,000 mcg by mouth daily.     buPROPion  (WELLBUTRIN  XL) 300 MG 24 hr tablet Take 1 tablet (300 mg total) by mouth daily. 30 tablet 1   busPIRone  (BUSPAR ) 7.5 MG tablet  Take 1 tablet (7.5 mg total) by mouth 2 (two) times daily. 60 tablet 1   Calcium  Carb-Cholecalciferol  (CALCIUM  600 + D PO) Take 1 tablet by mouth daily.     Cholecalciferol  (VITAMIN D ) 2000 UNITS CAPS Take 2,000 Units by mouth daily.     Cyanocobalamin (B-12) 1000 MCG CAPS Take 1,000 mcg by mouth daily.      denosumab  (PROLIA ) 60 MG/ML SOSY injection Inject 60 mg into the skin every 6 (six) months. At MD office, last done 03/2022     diclofenac  sodium (VOLTAREN ) 1 % GEL Apply 2 g topically 4 (four) times daily. (Patient taking differently: Apply 2 g topically 4 (four) times daily as needed (pain).) 3 Tube 3   hydrOXYzine  (ATARAX ) 10 MG tablet Take 10 mg - 20 mg (1-2 tablets) three times daily as needed for anxiety or sleep 60 tablet 1   Multiple Vitamin (MULTIVITAMIN WITH MINERALS) TABS tablet Take 1 tablet by mouth daily.     omeprazole  (PRILOSEC) 40 MG capsule TAKE 1 CAPSULE (40 MG TOTAL) BY MOUTH DAILY. 90 capsule 1   sertraline  (ZOLOFT ) 50 MG tablet Take 1 tablet (50 mg total) by mouth daily. 30 tablet 1   Current Facility-Administered Medications on File Prior to Visit  Medication Dose Route Frequency Provider Last Rate Last Admin   denosumab  (PROLIA ) injection 60 mg  60 mg Subcutaneous Once Gherghe, Cristina, MD        Past Medical History:  Diagnosis Date   Allergy    Anemia    Anxiety    Arthritis    Cancer (HCC)    Cataract    bilateral removed   Colonic polyp 2005 & 2013   DDD (degenerative disc disease)    Depression    Diverticulitis large intestine 12/12/2022   GAD (generalized anxiety disorder) 11/11/2023   GERD (gastroesophageal reflux disease)    H/O echocardiogram    before 2000, no need for f/u /w cardiac    Heart murmur    MVP- echo- 2010, no symptoms    MDD (major depressive disorder), recurrent episode, moderate (HCC) 11/11/2023   Memory disorder 09/19/2017   Mononucleosis 1971   Osteopenia after menopause    Ovarian mass, left 12/12/2022   See MRI 12/12/22   Sleep apnea    cpap   Allergies  Allergen Reactions   Gabapentin     Tongue swelling and itching     Social History   Socioeconomic History   Marital status: Married    Spouse name: Not on file   Number of children: 2   Years of education: 16+   Highest education level: Master's degree  (e.g., MA, MS, MEng, MEd, MSW, MBA)  Occupational History   Occupation: Runner, broadcasting/film/video- Retired  Tobacco Use   Smoking status: Never   Smokeless tobacco: Never  Vaping Use   Vaping status: Never Used  Substance and Sexual Activity   Alcohol use: Never   Drug use: Never   Sexual activity: Not Currently    Partners: Male    Birth control/protection: Post-menopausal  Other Topics Concern   Not on file  Social History Narrative   Lives   Caffeine use:    Right handed    Social Drivers of Health   Financial Resource Strain: Low Risk  (06/03/2024)   Overall Financial Resource Strain (CARDIA)    Difficulty of Paying Living Expenses: Not hard at all  Food Insecurity: No Food Insecurity (06/03/2024)   Hunger Vital Sign    Worried About  Running Out of Food in the Last Year: Never true    Ran Out of Food in the Last Year: Never true  Transportation Needs: No Transportation Needs (06/03/2024)   PRAPARE - Administrator, Civil Service (Medical): No    Lack of Transportation (Non-Medical): No  Physical Activity: Insufficiently Active (06/03/2024)   Exercise Vital Sign    Days of Exercise per Week: 2 days    Minutes of Exercise per Session: 20 min  Stress: Stress Concern Present (06/03/2024)   [employer] of Occupational Health - Occupational Stress Questionnaire    Feeling of Stress: Very much  Social Connections: Socially Integrated (06/03/2024)   Social Connection and Isolation Panel    Frequency of Communication with Friends and Family: More than three times a week    Frequency of Social Gatherings with Friends and Family: More than three times a week    Attends Religious Services: More than 4 times per year    Active Member of Clubs or Organizations: Yes    Attends Banker Meetings: More than 4 times per year    Marital Status: Married   Vitals:   06/04/24 1045  BP: 138/70  Pulse: (!) 114  Resp: 16  SpO2: 96%   Body mass index is 25.55 kg/m.  Physical  Exam Vitals and nursing note reviewed.  Constitutional:      General: She is not in acute distress.    Appearance: She is well-developed.  HENT:     Head: Normocephalic and atraumatic.     Mouth/Throat:     Mouth: Mucous membranes are moist.     Pharynx: Oropharynx is clear. Uvula midline.  Eyes:     Conjunctiva/sclera: Conjunctivae normal.  Cardiovascular:     Rate and Rhythm: Normal rate and regular rhythm.     Heart sounds: No murmur heard.    Comments: Palpable DP pulses. HR 94/min Pulmonary:     Effort: Pulmonary effort is normal. No respiratory distress.     Breath sounds: Normal breath sounds.  Abdominal:     Palpations: Abdomen is soft. There is no hepatomegaly or mass.     Tenderness: There is no abdominal tenderness.  Musculoskeletal:     Right lower leg: No edema.     Left lower leg: No edema.  Lymphadenopathy:     Cervical: No cervical adenopathy.  Skin:    General: Skin is warm.     Findings: No erythema or rash.  Neurological:     General: No focal deficit present.     Mental Status: She is alert and oriented to person, place, and time.     Cranial Nerves: No cranial nerve deficit.     Gait: Gait normal.  Psychiatric:        Mood and Affect: Mood and affect normal.   ASSESSMENT AND PLAN:  Ms. Hendricks was seen today for pre-op clearance. Orders Placed This Encounter  Procedures   Basic metabolic panel with GFR   Microalbumin / creatinine urine ratio   EKG 12-Lead   Lab Results  Component Value Date   NA 140 06/04/2024   CL 102 06/04/2024   K 4.0 06/04/2024   CO2 29 06/04/2024   BUN 17 06/04/2024   CREATININE 1.18 06/04/2024   GFR 45.60 (L) 06/04/2024   CALCIUM  9.2 06/04/2024   ALBUMIN 4.3 01/29/2024   GLUCOSE 87 06/04/2024   Lab Results  Component Value Date   MICROALBUR 2.7 (H) 06/04/2024   Pre-op exam Planning  on having right reverse total shoulder arthroplasty. Surgical clearance requested by Dr. Josefina. Her chronic medical problems are  stable. DVT prophylaxis: Early ambulation. We discussed son side effects of opioid medications, recommend MiraLAX  to prevent constipation. Instructed to avoid all OTC supplements and vitamins as well as NSAIDs/Aspirin  for at least a week before procedure. She can take her bupropion , sertraline , and omeprazole  the day before.  She can resume this medication after surgical procedure when she is allowed to resume oral intake. Last CBC in 04/2024 at her oncologist office. BMP ordered today. EKG today normal sinus rhythm, normal axis and intervals, no ST/T changes.  No significant changes when compared with EKG done in 12/2022. Preop form and copy of today's note will be faxed to Dr. Jari office.  -     EKG 12-Lead  Stage 3a chronic kidney disease [facility]) Assessment & Plan: Problem has been stable. Last Cr 1.18 and e GFR 46. Continue adequate hydration, low-salt diet, and avoidance of NSAIDs.  Orders: -     Basic metabolic panel with GFR; Future -     Microalbumin / creatinine urine ratio; Future  Return if symptoms worsen or fail to improve, for keep next appointment.  Naly Schwanz G. [place], MD  [facility]. [place] office.

## 2024-06-04 NOTE — Assessment & Plan Note (Addendum)
 Problem has been stable. Last Cr 1.18 and e GFR 46. Continue adequate hydration, low-salt diet, and avoidance of NSAIDs.

## 2024-06-04 NOTE — Patient Instructions (Signed)
 A few things to remember from today's visit:  Pre-op exam - Plan: EKG 12-Lead  Stage 3a chronic kidney disease (HCC) - Plan: Basic metabolic panel with GFR, Microalbumin / creatinine urine ratio  Stop all over the counter supplements at least a week before procedure.  Just Tylenol  for pain. Will send copy of note and pre op form to surgeon.  If you need refills for medications you take chronically, please call your pharmacy. Do not use My Chart to request refills or for acute issues that need immediate attention. If you send a my chart message, it may take a few days to be addressed, specially if I am not in the office.  Please be sure medication list is accurate. If a new problem present, please set up appointment sooner than planned today.

## 2024-06-10 ENCOUNTER — Other Ambulatory Visit: Payer: Self-pay

## 2024-06-15 DIAGNOSIS — Z1231 Encounter for screening mammogram for malignant neoplasm of breast: Secondary | ICD-10-CM | POA: Diagnosis not present

## 2024-06-15 LAB — HM MAMMOGRAPHY

## 2024-06-16 DIAGNOSIS — S46011A Strain of muscle(s) and tendon(s) of the rotator cuff of right shoulder, initial encounter: Secondary | ICD-10-CM | POA: Diagnosis not present

## 2024-06-22 ENCOUNTER — Encounter: Payer: Self-pay | Admitting: Obstetrics and Gynecology

## 2024-06-24 ENCOUNTER — Ambulatory Visit: Payer: Self-pay | Admitting: Obstetrics and Gynecology

## 2024-07-01 DIAGNOSIS — G8918 Other acute postprocedural pain: Secondary | ICD-10-CM | POA: Diagnosis not present

## 2024-07-01 DIAGNOSIS — M19011 Primary osteoarthritis, right shoulder: Secondary | ICD-10-CM | POA: Diagnosis not present

## 2024-07-01 DIAGNOSIS — M75121 Complete rotator cuff tear or rupture of right shoulder, not specified as traumatic: Secondary | ICD-10-CM | POA: Diagnosis not present

## 2024-07-05 DIAGNOSIS — M25511 Pain in right shoulder: Secondary | ICD-10-CM | POA: Diagnosis not present

## 2024-07-05 DIAGNOSIS — M25611 Stiffness of right shoulder, not elsewhere classified: Secondary | ICD-10-CM | POA: Diagnosis not present

## 2024-07-07 DIAGNOSIS — M25611 Stiffness of right shoulder, not elsewhere classified: Secondary | ICD-10-CM | POA: Diagnosis not present

## 2024-07-07 DIAGNOSIS — M25511 Pain in right shoulder: Secondary | ICD-10-CM | POA: Diagnosis not present

## 2024-07-12 DIAGNOSIS — M25511 Pain in right shoulder: Secondary | ICD-10-CM | POA: Diagnosis not present

## 2024-07-12 DIAGNOSIS — Z96611 Presence of right artificial shoulder joint: Secondary | ICD-10-CM | POA: Diagnosis not present

## 2024-07-16 ENCOUNTER — Encounter: Payer: Self-pay | Admitting: Family Medicine

## 2024-07-18 NOTE — Telephone Encounter (Addendum)
 Pt > 90 days PAST DUE for PROLIA  injection.   Let me know if pt would like to proceed with Prolia  for treatment of Osteoporosis, I will need to re-run benefits and prior auth.   If you would like for pt to continue with Prolia  therapy, please have clinical staff reach out to pt for scheduling and to explain to importance of receiving Prolia  injections every 6 months as abrupt cessation of Prolia  raises risk of osteoporotic fracture.    Discontinuation of Dmab is associated with a 3- to 5-fold higher risk for vertebral, major osteoporotic, and hip fractures [38,39].   [email or web address]

## 2024-07-19 NOTE — Telephone Encounter (Signed)
 She sees psychiatrist regularly, so she needs to call provider's office. Thanks, BJ

## 2024-07-19 NOTE — Telephone Encounter (Signed)
 Jasmine, Please let's try again to call her and if not able to communicate with her, we need to send her a letter.  If she does not want to continue with Prolia , I need to know so I can prescribe something else.  If it is too expensive, may need to try Prolia  analog.  Brandy, Can we also try to obtain a preauthorization for a Prolia  analog ([employer]?). Thank you! CG

## 2024-07-20 DIAGNOSIS — M25611 Stiffness of right shoulder, not elsewhere classified: Secondary | ICD-10-CM | POA: Diagnosis not present

## 2024-07-20 DIAGNOSIS — M25511 Pain in right shoulder: Secondary | ICD-10-CM | POA: Diagnosis not present

## 2024-07-21 NOTE — Telephone Encounter (Signed)
 Called the number on file and it goes straight to VM. Letter has been placed in the outgoing mail.

## 2024-07-28 NOTE — Telephone Encounter (Signed)
  VOB initiated for Jub[employer]onti via [employer] Hub Patient ID [facility]

## 2024-07-29 DIAGNOSIS — M25511 Pain in right shoulder: Secondary | ICD-10-CM | POA: Diagnosis not present

## 2024-07-29 DIAGNOSIS — M25611 Stiffness of right shoulder, not elsewhere classified: Secondary | ICD-10-CM | POA: Diagnosis not present

## 2024-08-01 NOTE — Assessment & Plan Note (Signed)
-  stage 0 - Diagnosed in early May 2025.  ALC 11K, no anemia or thrombocytopenia patient was asymptomatic.  - Presented with leukocytosis with predominant lymphocytes since 2022 - No treatment needed at this point, will continue monitoring closely.

## 2024-08-02 ENCOUNTER — Inpatient Hospital Stay (HOSPITAL_BASED_OUTPATIENT_CLINIC_OR_DEPARTMENT_OTHER): Admitting: Hematology

## 2024-08-02 ENCOUNTER — Telehealth: Payer: Self-pay | Admitting: Hematology

## 2024-08-02 ENCOUNTER — Inpatient Hospital Stay: Attending: Hematology

## 2024-08-02 VITALS — BP 140/70 | HR 103 | Temp 97.3°F | Resp 16 | Ht <= 58 in | Wt 122.8 lb

## 2024-08-02 DIAGNOSIS — D72829 Elevated white blood cell count, unspecified: Secondary | ICD-10-CM

## 2024-08-02 DIAGNOSIS — C9110 Chronic lymphocytic leukemia of B-cell type not having achieved remission: Secondary | ICD-10-CM

## 2024-08-02 DIAGNOSIS — D649 Anemia, unspecified: Secondary | ICD-10-CM | POA: Insufficient documentation

## 2024-08-02 LAB — CBC WITH DIFFERENTIAL/PLATELET
Abs Immature Granulocytes: 0.02 K/uL (ref 0.00–0.07)
Basophils Absolute: 0.1 K/uL (ref 0.0–0.1)
Basophils Relative: 0 %
Eosinophils Absolute: 0.2 K/uL (ref 0.0–0.5)
Eosinophils Relative: 1 %
HCT: 33.1 % — ABNORMAL LOW (ref 36.0–46.0)
Hemoglobin: 10.9 g/dL — ABNORMAL LOW (ref 12.0–15.0)
Immature Granulocytes: 0 %
Lymphocytes Relative: 71 %
Lymphs Abs: 12.4 K/uL — ABNORMAL HIGH (ref 0.7–4.0)
MCH: 31.8 pg (ref 26.0–34.0)
MCHC: 32.9 g/dL (ref 30.0–36.0)
MCV: 96.5 fL (ref 80.0–100.0)
Monocytes Absolute: 0.6 K/uL (ref 0.1–1.0)
Monocytes Relative: 4 %
Neutro Abs: 4.2 K/uL (ref 1.7–7.7)
Neutrophils Relative %: 24 %
Platelets: 260 K/uL (ref 150–400)
RBC: 3.43 MIL/uL — ABNORMAL LOW (ref 3.87–5.11)
RDW: 14.3 % (ref 11.5–15.5)
Smear Review: NORMAL
WBC: 17.5 K/uL — ABNORMAL HIGH (ref 4.0–10.5)
nRBC: 0 % (ref 0.0–0.2)

## 2024-08-02 NOTE — Progress Notes (Signed)
 [facility]   [phone] [phone]   Clinic Follow up Note   Patient Care Team: Jordan, [name], MD as PCP - General (Family Medicine) Cathlyn JAYSON Nikki Bobie FORBES, MD as Consulting Physician (Obstetrics and Gynecology)  Date of Service:  08/02/2024  CHIEF COMPLAINT: f/u of CLL  CURRENT THERAPY:  Observation  Oncology History   CLL (chronic lymphocytic leukemia) (HCC) -stage 0 - Diagnosed in early May 2025.  ALC 11K, no anemia or thrombocytopenia patient was asymptomatic.  - Presented with leukocytosis with predominant lymphocytes since 2022 - No treatment needed at this point, will continue monitoring closely.  Assessment & Plan Chronic lymphocytic leukemia, B-cell type Chronic lymphocytic leukemia (CLL) is well-managed with a lymphocyte count of 12.4, slightly increased from 11 six months ago. No treatment is required as the increase is not rapid enough to warrant intervention. No anemia, thrombocytopenia, or symptoms indicating treatment necessity. Many patients with CLL remain stable for years without treatment. - Continue monitoring CLL with lab work every six months. - Scheduled follow-up appointment in one year.  Mild anemia likely related to recent shoulder surgery Mild anemia is likely secondary to recent shoulder surgery. No significant concern as it is mild and likely transient. - Continue to monitor anemia status with routine lab work.  Plan - Lab reviewed, will continue monitoring her CLL, she does not require any treatment. - Lab every 6 months, follow-up with me in 1 year   Discussed the use of AI scribe software for clinical note transcription with the patient, who gave verbal consent to proceed.  History of Present Illness Jessica Casey is a 73 year old female with chronic lymphocytic leukemia who presents for follow-up.  Her lymphocyte count is 12.4, showing a slight increase from 11 six months ago. She has anemia, which  may be related to her recent shoulder surgery. She underwent reverse shoulder replacement surgery due to a recurrent rotator cuff tear, with ongoing recovery and support from her husband.     All other systems were reviewed with the patient and are negative.  MEDICAL HISTORY:  Past Medical History:  Diagnosis Date   Allergy    Anemia    Anxiety    Arthritis    Cancer (HCC)    Cataract    bilateral removed   Colonic polyp 2005 & 2013   DDD (degenerative disc disease)    Depression    Diverticulitis large intestine 12/12/2022   GAD (generalized anxiety disorder) 11/11/2023   GERD (gastroesophageal reflux disease)    H/O echocardiogram    before 2000, no need for f/u /w cardiac    Heart murmur    MVP- echo- 2010, no symptoms    MDD (major depressive disorder), recurrent episode, moderate (HCC) 11/11/2023   Memory disorder 09/19/2017   Mononucleosis 1971   Osteopenia after menopause    Ovarian mass, left 12/12/2022   See MRI 12/12/22   Sleep apnea    cpap    SURGICAL HISTORY: Past Surgical History:  Procedure Laterality Date   ABDOMINAL HYSTERECTOMY     Full April 2024   ANTERIOR CERVICAL DECOMP/DISCECTOMY FUSION N/A 05/20/2013   Procedure: ANTERIOR CERVICAL DECOMPRESSION/DISCECTOMY FUSION 2 LEVELS;  Surgeon: Fairy Levels, MD;  Location: [facility];  Service: Neurosurgery;  Laterality: N/A;  Cervical Seven-Thoracic One, Thoracic One-Two Anterior cervical decompression/Diskectomy/Fusion   BACK SURGERY     CARPAL TUNNEL RELEASE Right    CATARACT EXTRACTION, BILATERAL  10/01/2011   Dr Roz, EFRAIM  IOL   CERVICAL FUSION  2014   3 proceduresr Unice RITA OFFICER and 2014   COLONOSCOPY  2018   SA- TA   colonoscopy with polypectomy  10/01/2011   Dr Debrah   EYE SURGERY     Cateracts Extration   FRACTURE SURGERY     LeftWrist   HERNIA REPAIR     umbilical at 18 mos   JOINT REPLACEMENT     Right Hip 2022   KNEE SURGERY     Dr Willy ; bursa cystectomy   LUMBAR DISC  SURGERY  2010   Dr.Stern and 1999   ROBOTIC ASSISTED TOTAL HYSTERECTOMY WITH BILATERAL SALPINGO OOPHERECTOMY Bilateral 01/28/2023   Procedure: XI ROBOTIC ASSISTED TOTAL HYSTERECTOMY WITH BILATERAL SALPINGO OOPHORECTOMY;  Surgeon: Eldonna Mays, MD;  Location: [facility];  Service: Gynecology;  Laterality: Bilateral;   ROTATOR CUFF REPAIR     Dr.Wainer   SPINE SURGERY     2 Lumbar, 3 Cervical   TOTAL HIP ARTHROPLASTY Right 09/04/2021   Procedure: TOTAL HIP ARTHROPLASTY ANTERIOR APPROACH;  Surgeon: Beverley Evalene BIRCH, MD;  Location: [facility];  Service: Orthopedics;  Laterality: Right;   UMBILICAL HERNIA REPAIR  1952   as a baby   WRIST FRACTURE SURGERY Left 02/28/2013   Dr. Toribio Beverley    I have reviewed the social history and family history with the patient and they are unchanged from previous note.  ALLERGIES:  is allergic to gabapentin.  MEDICATIONS:  Current Outpatient Medications  Medication Sig Dispense Refill   acetaminophen  (TYLENOL ) 650 MG CR tablet Take 1,300 mg by mouth every 8 (eight) hours as needed for pain.     Bacillus Coagulans-Inulin (ALIGN PREBIOTIC-PROBIOTIC PO) Take by mouth.     Biotin  1000 MCG tablet Take 1,000 mcg by mouth daily.     buPROPion  (WELLBUTRIN  XL) 300 MG 24 hr tablet Take 1 tablet (300 mg total) by mouth daily. 30 tablet 1   busPIRone  (BUSPAR ) 7.5 MG tablet Take 1 tablet (7.5 mg total) by mouth 2 (two) times daily. 60 tablet 1   Calcium  Carb-Cholecalciferol  (CALCIUM  600 + D PO) Take 1 tablet by mouth daily.     Cholecalciferol  (VITAMIN D ) 2000 UNITS CAPS Take 2,000 Units by mouth daily.     Cyanocobalamin (B-12) 1000 MCG CAPS Take 1,000 mcg by mouth daily.     denosumab  (PROLIA ) 60 MG/ML SOSY injection Inject 60 mg into the skin every 6 (six) months. At MD office, last done 03/2022     diclofenac  sodium (VOLTAREN ) 1 % GEL Apply 2 g topically 4 (four) times daily. (Patient taking differently: Apply 2 g topically 4 (four) times daily as needed (pain).) 3  Tube 3   hydrOXYzine  (ATARAX ) 10 MG tablet Take 10 mg - 20 mg (1-2 tablets) three times daily as needed for anxiety or sleep 60 tablet 1   Multiple Vitamin (MULTIVITAMIN WITH MINERALS) TABS tablet Take 1 tablet by mouth daily.     omeprazole  (PRILOSEC) 40 MG capsule TAKE 1 CAPSULE (40 MG TOTAL) BY MOUTH DAILY. 90 capsule 1   sertraline  (ZOLOFT ) 50 MG tablet Take 1 tablet (50 mg total) by mouth daily. 30 tablet 1   Current Facility-Administered Medications  Medication Dose Route Frequency Provider Last Rate Last Admin   denosumab  (PROLIA ) injection 60 mg  60 mg Subcutaneous Once Gherghe, Cristina, MD        PHYSICAL EXAMINATION: ECOG PERFORMANCE STATUS: 1 - Symptomatic but completely ambulatory  Vitals:   08/02/24 1045 08/02/24 1046  BP: (!) 143/66 (!) 140/70  Pulse: 99 (!) 103  Resp: 16   Temp: (!) 97.3 F (36.3 C)   SpO2: 98% 99%   Wt Readings from Last 3 Encounters:  08/02/24 122 lb 12.8 oz (55.7 kg)  06/04/24 122 lb 4 oz (55.5 kg)  05/18/24 123 lb (55.8 kg)     GENERAL:alert, no distress and comfortable SKIN: skin color, texture, turgor are normal, no rashes or significant lesions EYES: normal, Conjunctiva are pink and non-injected, sclera clear  Musculoskeletal:no cyanosis of digits and no clubbing    Physical Exam    LABORATORY DATA:  I have reviewed the data as listed    Latest Ref Rng & Units 08/02/2024   10:01 AM 05/14/2024   11:12 AM 01/29/2024    3:13 PM  CBC  WBC 4.0 - 10.5 K/uL 17.5  14.5  14.9   Hemoglobin 12.0 - 15.0 g/dL 89.0  88.9  87.6   Hematocrit 36.0 - 46.0 % 33.1  33.6  37.2   Platelets 150 - 400 K/uL 260  295  250         Latest Ref Rng & Units 06/04/2024   11:39 AM 01/29/2024    3:13 PM 11/11/2023    2:23 PM  CMP  Glucose 70 - 99 mg/dL 87  82  89   BUN 6 - 23 mg/dL 17  19  31    Creatinine 0.40 - 1.20 mg/dL 8.81  8.75  8.90   Sodium 135 - 145 mEq/L 140  140  143   Potassium 3.5 - 5.1 mEq/L 4.0  4.2  4.3   Chloride 96 - 112 mEq/L 102  104   105   CO2 19 - 32 mEq/L 29  32  25   Calcium  8.4 - 10.5 mg/dL 9.2  9.2  9.3   Total Protein 6.5 - 8.1 g/dL  7.1  6.6   Total Bilirubin 0.0 - 1.2 mg/dL  0.4  0.4   Alkaline Phos 38 - 126 U/L  70  80   AST 15 - 41 U/L  25  19   ALT 0 - 44 U/L  19  16       RADIOGRAPHIC STUDIES: I have personally reviewed the radiological images as listed and agreed with the findings in the report. No results found.    No orders of the defined types were placed in this encounter.  All questions were answered. The patient knows to call the clinic with any problems, questions or concerns. No barriers to learning was detected. The total time spent in the appointment was 10 minutes, including review of chart and various tests results, discussions about plan of care and coordination of care plan     Onita Mattock, MD 08/02/2024

## 2024-08-02 NOTE — Telephone Encounter (Signed)
 Called patient to  scheduled appt and pt is aware of appt date and times.

## 2024-08-04 ENCOUNTER — Other Ambulatory Visit (HOSPITAL_COMMUNITY): Payer: Self-pay | Admitting: Registered Nurse

## 2024-08-04 ENCOUNTER — Telehealth (HOSPITAL_COMMUNITY): Payer: Self-pay | Admitting: Registered Nurse

## 2024-08-04 ENCOUNTER — Encounter (HOSPITAL_COMMUNITY): Payer: Self-pay | Admitting: Registered Nurse

## 2024-08-04 ENCOUNTER — Telehealth (HOSPITAL_COMMUNITY): Admitting: Registered Nurse

## 2024-08-04 DIAGNOSIS — F331 Major depressive disorder, recurrent, moderate: Secondary | ICD-10-CM

## 2024-08-04 DIAGNOSIS — M25611 Stiffness of right shoulder, not elsewhere classified: Secondary | ICD-10-CM | POA: Diagnosis not present

## 2024-08-04 DIAGNOSIS — F411 Generalized anxiety disorder: Secondary | ICD-10-CM

## 2024-08-04 DIAGNOSIS — M25511 Pain in right shoulder: Secondary | ICD-10-CM | POA: Diagnosis not present

## 2024-08-04 MED ORDER — SERTRALINE HCL 50 MG PO TABS: 50.0000 mg | ORAL_TABLET | Freq: Every day | ORAL | 0 refills | Status: DC

## 2024-08-04 NOTE — Telephone Encounter (Signed)
 Pt is calling.  Needs refill on Zoloft  50mg   [facility] [address]  Next Visit -11/12 Last Visit -7/8

## 2024-08-10 DIAGNOSIS — M25511 Pain in right shoulder: Secondary | ICD-10-CM | POA: Diagnosis not present

## 2024-08-10 DIAGNOSIS — M25611 Stiffness of right shoulder, not elsewhere classified: Secondary | ICD-10-CM | POA: Diagnosis not present

## 2024-08-12 DIAGNOSIS — M25611 Stiffness of right shoulder, not elsewhere classified: Secondary | ICD-10-CM | POA: Diagnosis not present

## 2024-08-12 DIAGNOSIS — M25511 Pain in right shoulder: Secondary | ICD-10-CM | POA: Diagnosis not present

## 2024-08-20 DIAGNOSIS — M25611 Stiffness of right shoulder, not elsewhere classified: Secondary | ICD-10-CM | POA: Diagnosis not present

## 2024-08-20 DIAGNOSIS — M25511 Pain in right shoulder: Secondary | ICD-10-CM | POA: Diagnosis not present

## 2024-08-23 DIAGNOSIS — M25611 Stiffness of right shoulder, not elsewhere classified: Secondary | ICD-10-CM | POA: Diagnosis not present

## 2024-08-23 DIAGNOSIS — M25511 Pain in right shoulder: Secondary | ICD-10-CM | POA: Diagnosis not present

## 2024-08-30 ENCOUNTER — Encounter: Payer: Self-pay | Admitting: Family Medicine

## 2024-09-02 ENCOUNTER — Other Ambulatory Visit (HOSPITAL_COMMUNITY): Payer: Self-pay | Admitting: Registered Nurse

## 2024-09-02 ENCOUNTER — Telehealth (HOSPITAL_COMMUNITY): Payer: Self-pay | Admitting: *Deleted

## 2024-09-02 DIAGNOSIS — F331 Major depressive disorder, recurrent, moderate: Secondary | ICD-10-CM

## 2024-09-02 DIAGNOSIS — F411 Generalized anxiety disorder: Secondary | ICD-10-CM

## 2024-09-02 DIAGNOSIS — M25511 Pain in right shoulder: Secondary | ICD-10-CM | POA: Diagnosis not present

## 2024-09-02 DIAGNOSIS — M25611 Stiffness of right shoulder, not elsewhere classified: Secondary | ICD-10-CM | POA: Diagnosis not present

## 2024-09-02 MED ORDER — SERTRALINE HCL 50 MG PO TABS: 50.0000 mg | ORAL_TABLET | Freq: Every day | ORAL | 0 refills | Status: AC

## 2024-09-02 NOTE — Telephone Encounter (Signed)
 [facility] [place] to have her sertraline  be a 90 day rx. I dont see a recent visit or a future appt. She is not a patient of this office so I will forward this to Luisa Ruder NP her provider to consider.

## 2024-09-19 NOTE — Telephone Encounter (Signed)
 Prolia  VOB initiated via [email or web address]  Next Prolia  inj DUE: past due - rerunning for 2026

## 2024-09-21 ENCOUNTER — Telehealth (HOSPITAL_BASED_OUTPATIENT_CLINIC_OR_DEPARTMENT_OTHER): Payer: Self-pay | Admitting: *Deleted

## 2024-09-21 NOTE — Telephone Encounter (Signed)
 CMN received for CPAP supplies signed by provider and faxed confirmation received [id number]

## 2024-09-29 ENCOUNTER — Other Ambulatory Visit: Payer: Self-pay | Admitting: Family Medicine

## 2024-09-29 DIAGNOSIS — K219 Gastro-esophageal reflux disease without esophagitis: Secondary | ICD-10-CM

## 2024-10-09 NOTE — Telephone Encounter (Signed)
 [employer] - Prior Authorization REQUIRED for [employer]

## 2024-10-10 NOTE — Telephone Encounter (Addendum)
 Prior Authorization initiated for Prolia  60MG /ML syringes via [email or web address] KEY: [id number]     Trixie File, MD  11/10/2023 5:00 PM Dear Ms. Kashuba, I actually just saw the bone density scan today, I apologize for the delay. It appears that they changed the analysis between the 2023 and 2025 scans. The left hip score appears to have increased (increased from -2.0 to -0.9 now) but, again, the values are not directly comparable. At the level of the forearm, the T-score appears to be lower, decreased from -0.9 to -1.2, but the difference was not reported to be significant. Therefore, for now, I would continue with Prolia  and get another bone density in 2 years to reevaluate. Sincerely, File Trixie MD

## 2024-10-14 ENCOUNTER — Other Ambulatory Visit

## 2024-10-14 ENCOUNTER — Ambulatory Visit: Payer: Medicare PPO | Admitting: Internal Medicine

## 2024-10-14 ENCOUNTER — Encounter: Payer: Self-pay | Admitting: Internal Medicine

## 2024-10-14 ENCOUNTER — Ambulatory Visit

## 2024-10-14 VITALS — BP 124/60 | HR 91 | Ht <= 58 in | Wt 119.4 lb

## 2024-10-14 DIAGNOSIS — M810 Age-related osteoporosis without current pathological fracture: Secondary | ICD-10-CM

## 2024-10-14 DIAGNOSIS — E559 Vitamin D deficiency, unspecified: Secondary | ICD-10-CM | POA: Diagnosis not present

## 2024-10-14 MED ORDER — DENOSUMAB 60 MG/ML ~~LOC~~ SOSY: 60.0000 mg | PREFILLED_SYRINGE | Freq: Once | SUBCUTANEOUS | Status: AC

## 2024-10-14 NOTE — Telephone Encounter (Signed)
 [employer]  Prior Authorization for PROLIA  APPROVED PA# [id number] Valid: 10/11/24-09/29/25

## 2024-10-14 NOTE — Addendum Note (Signed)
 Addended by: CLEOTILDE ROLIN RAMAN on: 10/14/2024 04:48 PM   Modules accepted: Orders

## 2024-10-14 NOTE — Progress Notes (Signed)
 Patient ID: Jessica Casey, female   DOB: 1951/07/25, 74 y.o.   MRN: [id number]   HPI  Jessica Casey is a 74 y.o.-year-old female, initially referred by her OB/GYN doctor, Dr. Cathlyn, returning for follow-up for osteopenia (Op) on DXA scans, but with clinical osteoporosis.  Last visit 1 year ago.  Interim history: No fractures or falls since last visit.  No dizziness/orthostasis/poor vision. Occasional mild vertigo. She had R shoulder replacement Sx 07/01/2024. She was in PT. Since last visit, she was diagnosed with CLL.  This is mild and can be just followed for now without intervention.  She has no symptoms.  Reviewed history: She was diagnosed with osteoporosis in 2012, then osteopenia in 2016.  However, due to history of fracture, she has clinical osteoporosis.  Reviewed previous DXA scan reports: 10/28/2023 [facility]) L1-L4 T score FN T score 33% distal Radius  -Full report not available  n/a RFN: ? LFN: -0.9 -1.2 (-4%)     10/19/2021 ([facility]) L1-L4 T score FN T score 33% distal Radius FRAX score  -Full report not available  n/a RFN: ? LFN: -2.0 -0.9  10-year major osteoporotic fracture: 12% Hip fracture: 2.4%   Date L1-L4 T score FN T score 33% distal Radius  08/23/2019 ([facility]) n/a RFN: -1.9 LFN: -2.0 -1.4  08/12/2017 ([facility]) n/a RFN: -1.6 LFN: -1.6  -1.6  08/07/2015 ([facility]) n/a RFN: -1.9 LFN: -1.8  -0.7  05/07/2011 ([facility]) n/a RFN: -2.3 LFN: -2.5  -0.9   She has a history of: - wrist in 2015 (lost balance on concrete)  Osteoporosis treatments:  - Fosamax  70 mg weekly  - started 2012 - Prolia -started 03/07/2020; restarted after our visit from 09/2021:  11/09/2021 05/10/2022 11/12/2022 09/12/2023 She had ONJ at the site of a tooth extraction 11/2022 (by biopsy).  This healed well.  We decided to continue with Prolia . 09/12/2023 03/12/2024  She has a history of vitamin D  deficiency: Lab Results  Component Value Date   VD25OH 48 10/13/2023   VD25OH 67.99 10/11/2022    VD25OH 56.5 10/05/2021   VD25OH 76.41 10/03/2020   VD25OH 92.12 09/13/2019   VD25OH 79.96 02/10/2019   VD25OH 61 05/16/2014   VD25OH 28 (L) 11/01/2010   VD25OH 30 08/02/2009   She is on calcium  and vitamin D : Vitamin D  2000 units QD + multivitamin (now prenatal vitamin) + calcium -vitamin D .  She was walking for exercise 1.65 miles 3-4 times a week.  At last visit she was less active after her R hip replacement surgery in 08/2021.  She is walking.  She also does some weightbearing exercises, using bands. Also in PT.  She has a h/o 3 back surgeries - cervical area and also spinal fusion.  She continues to have back pain.  She does not take high vitamin A doses.  Menopause was at 74 years old.  She was on HRT for 2 years. However, BrCA in sister and mother (Estrogen R +) >> she was taken off HRT.  FH of osteoporosis: sister - Op.  No history of kidney stones, persistent hyper or hypocalcemia: Lab Results  Component Value Date   CALCIUM  9.2 06/04/2024   CALCIUM  9.2 01/29/2024   CALCIUM  9.3 11/11/2023   CALCIUM  9.1 01/23/2023   CALCIUM  9.6 10/11/2022   CALCIUM  9.6 10/05/2021   CALCIUM  9.1 08/29/2021   CALCIUM  9.4 07/30/2021   CALCIUM  9.0 10/03/2020   CALCIUM  9.2 09/13/2019   No history of thyrotoxicosis: Lab Results  Component Value Date   TSH 2.940 11/11/2023  TSH 4.070 10/05/2021   TSH 2.04 08/06/2016   TSH 1.28 08/08/2015   TSH 2.109 05/16/2014   No history of CKD, but latest BUN/creatinine: Lab Results  Component Value Date   BUN 17 06/04/2024   CREATININE 1.18 06/04/2024   ROS: + See HPI  I reviewed pt's medications, allergies, PMH, social hx, family hx, and changes were documented in the history of present illness. Otherwise, unchanged from my initial visit note.  Past Medical History:  Diagnosis Date   Allergy    Anemia    Anxiety    Arthritis    Cancer (HCC)    Cataract    bilateral removed   Colonic polyp 2005 & 2013   DDD (degenerative disc  disease)    Depression    Diverticulitis large intestine 12/12/2022   GAD (generalized anxiety disorder) 11/11/2023   GERD (gastroesophageal reflux disease)    H/O echocardiogram    before 2000, no need for f/u /w cardiac    Heart murmur    MVP- echo- 2010, no symptoms    MDD (major depressive disorder), recurrent episode, moderate (HCC) 11/11/2023   Memory disorder 09/19/2017   Mononucleosis 1971   Osteopenia after menopause    Ovarian mass, left 12/12/2022   See MRI 12/12/22   Sleep apnea    cpap   Past Surgical History:  Procedure Laterality Date   ABDOMINAL HYSTERECTOMY     Full April 2024   ANTERIOR CERVICAL DECOMP/DISCECTOMY FUSION N/A 05/20/2013   Procedure: ANTERIOR CERVICAL DECOMPRESSION/DISCECTOMY FUSION 2 LEVELS;  Surgeon: Fairy Levels, MD;  Location: [facility];  Service: Neurosurgery;  Laterality: N/A;  Cervical Seven-Thoracic One, Thoracic One-Two Anterior cervical decompression/Diskectomy/Fusion   BACK SURGERY     CARPAL TUNNEL RELEASE Right    CATARACT EXTRACTION, BILATERAL  10/01/2011   Dr Roz, EFRAIM IOL   CERVICAL FUSION  2014   3 proceduresr Levels, 2007, 20012 and 2014   COLONOSCOPY  2018   SA- TA   colonoscopy with polypectomy  10/01/2011   Dr Debrah   EYE SURGERY     Cateracts Extration   FRACTURE SURGERY     LeftWrist   HERNIA REPAIR     umbilical at 18 mos   JOINT REPLACEMENT     Right Hip 2022   KNEE SURGERY     Dr Willy ; bursa cystectomy   LUMBAR DISC SURGERY  2010   Dr.Stern and 1999   ROBOTIC ASSISTED TOTAL HYSTERECTOMY WITH BILATERAL SALPINGO OOPHERECTOMY Bilateral 01/28/2023   Procedure: XI ROBOTIC ASSISTED TOTAL HYSTERECTOMY WITH BILATERAL SALPINGO OOPHORECTOMY;  Surgeon: Eldonna Mays, MD;  Location: [facility];  Service: Gynecology;  Laterality: Bilateral;   ROTATOR CUFF REPAIR     Dr.Wainer   SPINE SURGERY     2 Lumbar, 3 Cervical   TOTAL HIP ARTHROPLASTY Right 09/04/2021   Procedure: TOTAL HIP ARTHROPLASTY ANTERIOR APPROACH;   Surgeon: Beverley Evalene BIRCH, MD;  Location: [facility];  Service: Orthopedics;  Laterality: Right;   UMBILICAL HERNIA REPAIR  1952   as a baby   WRIST FRACTURE SURGERY Left 02/28/2013   Dr. Toribio Beverley   Social History   Socioeconomic History   Marital status: Married    Spouse name: Not on file   Number of children: 2   Years of education: 16+   Highest education level: Not on file  Occupational History   Occupation: Editor, Commissioning- Retired  Tobacco Use   Smoking status: Never Smoker   Smokeless tobacco: Never Used  Substance  and Sexual Activity   Alcohol use: No    Alcohol/week: 0.0 standard drinks   Drug use: No   Sexual activity: Not Currently    Partners: Male    Birth control/protection: Post-menopausal  Other Topics Concern   Not on file  Social History Narrative   Lives   Caffeine use:    Right handed    Social Determinants of Health   Financial Resource Strain:    Difficulty of Paying Living Expenses: Not on file  Food Insecurity:    Worried About Running Out of Food in the Last Year: Not on file   [employer] of Food in the Last Year: Not on file  Transportation Needs:    Lack of Transportation (Medical): Not on file   Lack of Transportation (Non-Medical): Not on file  Physical Activity:    Days of Exercise per Week: Not on file   Minutes of Exercise per Session: Not on file  Stress:    Feeling of Stress : Not on file  Social Connections:    Frequency of Communication with Friends and Family: Not on file   Frequency of Social Gatherings with Friends and Family: Not on file   Attends Religious Services: Not on file   Active Member of Clubs or Organizations: Not on file   Attends Banker Meetings: Not on file   Marital Status: Not on file  Intimate Partner Violence:    Fear of Current or Ex-Partner: Not on file   Emotionally Abused: Not on file   Physically Abused: Not on file   Sexually Abused: Not on file   Current Outpatient Medications on  File Prior to Visit  Medication Sig Dispense Refill   acetaminophen  (TYLENOL ) 650 MG CR tablet Take 1,300 mg by mouth every 8 (eight) hours as needed for pain.     Bacillus Coagulans-Inulin (ALIGN PREBIOTIC-PROBIOTIC PO) Take by mouth.     Biotin  1000 MCG tablet Take 1,000 mcg by mouth daily.     buPROPion  (WELLBUTRIN  XL) 300 MG 24 hr tablet Take 1 tablet (300 mg total) by mouth daily. 30 tablet 1   busPIRone  (BUSPAR ) 7.5 MG tablet Take 1 tablet (7.5 mg total) by mouth 2 (two) times daily. 60 tablet 1   Calcium  Carb-Cholecalciferol  (CALCIUM  600 + D PO) Take 1 tablet by mouth daily.     Cholecalciferol  (VITAMIN D ) 2000 UNITS CAPS Take 2,000 Units by mouth daily.     Cyanocobalamin (B-12) 1000 MCG CAPS Take 1,000 mcg by mouth daily.     denosumab  (PROLIA ) 60 MG/ML SOSY injection Inject 60 mg into the skin every 6 (six) months. At MD office, last done 03/2022     diclofenac  sodium (VOLTAREN ) 1 % GEL Apply 2 g topically 4 (four) times daily. (Patient taking differently: Apply 2 g topically 4 (four) times daily as needed (pain).) 3 Tube 3   hydrOXYzine  (ATARAX ) 10 MG tablet Take 10 mg - 20 mg (1-2 tablets) three times daily as needed for anxiety or sleep 60 tablet 1   Multiple Vitamin (MULTIVITAMIN WITH MINERALS) TABS tablet Take 1 tablet by mouth daily.     omeprazole  (PRILOSEC) 40 MG capsule TAKE 1 CAPSULE (40 MG TOTAL) BY MOUTH DAILY. 90 capsule 1   sertraline  (ZOLOFT ) 50 MG tablet Take 1 tablet (50 mg total) by mouth daily. 90 tablet 0   Current Facility-Administered Medications on File Prior to Visit  Medication Dose Route Frequency Provider Last Rate Last Admin   denosumab  (PROLIA ) injection 60  mg  60 mg Subcutaneous Once Ayo Smoak, MD       Allergies  Allergen Reactions   Gabapentin     Tongue swelling and itching    Family History  Problem Relation Age of Onset   Breast cancer Mother 83       estorgen receptor positive   Stroke Mother 23       Dec   Arthritis Mother     Cancer Mother    Hearing loss Mother    Hyperlipidemia Mother    Hypertension Mother    Varicose Veins Mother    Diabetes Father    Coronary artery disease Father        S/P CBAG , no MI -- Dec   Depression Father    Diverticulosis Father    Arthritis Father    Hearing loss Father    Hypertension Father    Breast cancer Sister 27       estrogen receptor positive   Anxiety disorder Sister    Arthritis Sister    Cancer Sister    Depression Sister    Diabetes Sister    Hearing loss Sister    Hypertension Sister    Obesity Sister    Depression Sister    Depression Sister    Anxiety disorder Sister    Arthritis Sister    Diabetes Sister    Hypertension Sister    Obesity Sister    Depression Sister    Anxiety disorder Sister    Arthritis Sister    Cancer Sister    Diabetes Sister    Hearing loss Sister    Hypertension Sister    Depression Sister    Anxiety disorder Sister    Hypertension Sister    Colon polyps Sister    Anxiety disorder Sister    Arthritis Sister    Depression Sister    Diabetes Sister    Hearing loss Sister    Hypertension Sister    Obesity Sister    Depression Brother    Anxiety disorder Brother    Heart disease Brother    Hyperlipidemia Brother    Hypertension Brother    Colon cancer Maternal Grandfather    Bipolar disorder Other        Neice   Hypertension Maternal Grandmother    ADD / ADHD Son    Depression Daughter    Asthma Daughter    ADD / ADHD Daughter    Anxiety disorder Daughter    Esophageal cancer Neg Hx    Pancreatic cancer Neg Hx    Rectal cancer Neg Hx    Stomach cancer Neg Hx    Ovarian cancer Neg Hx    Endometrial cancer Neg Hx    PE: BP 124/60   Pulse 91   Ht 4' 10 (1.473 m)   Wt 119 lb 6.4 oz (54.2 kg)   LMP 10/01/1995   SpO2 97%   BMI 24.95 kg/m  Wt Readings from Last 3 Encounters:  10/14/24 119 lb 6.4 oz (54.2 kg)  08/02/24 122 lb 12.8 oz (55.7 kg)  06/04/24 122 lb 4 oz (55.5 kg)   Constitutional:  normal weight, in NAD Eyes:  EOMI, no exophthalmos ENT: no neck masses, no cervical lymphadenopathy Cardiovascular: RRR, No MRG Respiratory: CTA B Musculoskeletal: no deformities Skin:no rashes Neurological: no tremor with outstretched hands  Assessment: 1. Clinical Osteoporosis  2.  Vitamin D  deficiency  Plan: 1.  Clinical osteoporosis - Likely postmenopausal as she had early menopause, she also  has family history of osteopenia - Her bone density scans show T-scores into the osteopenic range.  On the last scan, the left femoral neck bone T-score appeared to be much improved.  At the 33% distal radius, the T-score was worse, but this was not read as a significant change - Before starting Prolia , her femoral T-scores were worse.  She also had a fragility fracture in 2015.  Overall, she has a diagnosis of clinical osteoporosis and she is at risk for further fractures. - We started Prolia  in 02/2020 but she was off the medication until our visit in 09/2021.  We decided to restart the medication at that time.  In 06/2022, she started to have jaw pain and was found to have a small site of ONJ by biopsy at the site of a dental extraction.  Of note, her dentist was not aware that she was on Prolia  at that time.  He mentions that had he known, he would have done the extractions differently.  She actually had to have jaw surgery in 11/2022.  This healed well.  We did discuss that ONJ can develop at the site of trauma to the jaw, for example dental extraction or surgical site, but we decided to continue Prolia  afterwards.  Latest 2 injections were on 09/12/2023 and 03/12/2024.  No jaw/thigh/hip pain.  She is due for another injection now.  Will do this today. - She is aware about fall precautions - She is getting 1200 mg of calcium  a day from diet and supplements - She continues to walk and exercising with bands.  We discussed about the importance of weightbearing exercises and she is doing this now. -  She will be due for another bone density scan in a year.  Will order this at next visit. -I will see her back in  1 year  2.  Vitamin D  deficiency - She continues supplementation with approximately 3000 units vitamin D  daily -will continue this today - Vitamin D  level was normal at last check: Lab Results  Component Value Date   VD25OH 48 10/13/2023  - Will recheck this today and adjust the dose of vitamin D  as needed based on the results  Orders Placed This Encounter  Procedures   VITAMIN D  25 Hydroxy (Vit-D Deficiency, Fractures)   Lela Fendt, MD PhD [facility]

## 2024-10-14 NOTE — Patient Instructions (Addendum)
 Please stop at the lab.  Please continue Prolia for now.  Please come back for a follow-up appointment in 1 year.

## 2024-10-15 ENCOUNTER — Ambulatory Visit: Payer: Self-pay | Admitting: Internal Medicine

## 2024-10-15 LAB — VITAMIN D 25 HYDROXY (VIT D DEFICIENCY, FRACTURES): Vit D, 25-Hydroxy: 97 ng/mL (ref 30–100)

## 2024-10-15 NOTE — Telephone Encounter (Signed)
 [facility] [employer]  Patient is ready for scheduling on or after 10/11/24  Out-of-pocket cost due at time of visit: $40  Primary: [employer]vantage  Prolia  co-insurance: 20% (approximately $331.87) Admin fee co-insurance: 20% (approximately $25)  Deductible: does not apply  Prior Auth: APPROVED PA# [id number] Valid: 10/11/24-09/29/25  Secondary: N/A Prolia  co-insurance:  Admin fee co-insurance:  Deductible:  Prior Auth:  PA# Valid:   ** This summary of benefits is an estimation of the patient's out-of-pocket cost. Exact cost may vary based on individual plan coverage.

## 2024-10-15 NOTE — Telephone Encounter (Signed)
 Last Prolia  inj 10/14/24 Next Prolia  inj due 04/14/25

## 2025-01-31 ENCOUNTER — Inpatient Hospital Stay: Attending: Hematology

## 2025-08-02 ENCOUNTER — Inpatient Hospital Stay

## 2025-08-02 ENCOUNTER — Inpatient Hospital Stay: Admitting: Hematology

## 2025-10-11 ENCOUNTER — Ambulatory Visit: Admitting: Internal Medicine
